# Patient Record
Sex: Female | Born: 1955
Health system: Southern US, Community
[De-identification: ages and names within clinical notes are randomized; demographics above are authoritative.]

## PROBLEM LIST (undated history)

## (undated) DIAGNOSIS — H269 Unspecified cataract: Secondary | ICD-10-CM

## (undated) DIAGNOSIS — F419 Anxiety disorder, unspecified: Secondary | ICD-10-CM

## (undated) DIAGNOSIS — Z9889 Other specified postprocedural states: Secondary | ICD-10-CM

## (undated) DIAGNOSIS — R112 Nausea with vomiting, unspecified: Secondary | ICD-10-CM

## (undated) DIAGNOSIS — E119 Type 2 diabetes mellitus without complications: Secondary | ICD-10-CM

## (undated) DIAGNOSIS — G4730 Sleep apnea, unspecified: Secondary | ICD-10-CM

## (undated) DIAGNOSIS — K219 Gastro-esophageal reflux disease without esophagitis: Secondary | ICD-10-CM

## (undated) DIAGNOSIS — Z923 Personal history of irradiation: Secondary | ICD-10-CM

## (undated) DIAGNOSIS — Z87442 Personal history of urinary calculi: Secondary | ICD-10-CM

## (undated) DIAGNOSIS — C801 Malignant (primary) neoplasm, unspecified: Secondary | ICD-10-CM

## (undated) DIAGNOSIS — Z9221 Personal history of antineoplastic chemotherapy: Secondary | ICD-10-CM

## (undated) DIAGNOSIS — I10 Essential (primary) hypertension: Secondary | ICD-10-CM

## (undated) DIAGNOSIS — E785 Hyperlipidemia, unspecified: Secondary | ICD-10-CM

## (undated) HISTORY — PX: ABDOMINAL HYSTERECTOMY: SHX81

## (undated) HISTORY — DX: Essential (primary) hypertension: I10

## (undated) HISTORY — DX: Hyperlipidemia, unspecified: E78.5

## (undated) HISTORY — PX: CHOLECYSTECTOMY: SHX55

## (undated) HISTORY — DX: Unspecified cataract: H26.9

## (undated) HISTORY — PX: KNEE SURGERY: SHX244

## (undated) HISTORY — DX: Type 2 diabetes mellitus without complications: E11.9

---

## 1997-07-24 ENCOUNTER — Encounter: Admission: RE | Admit: 1997-07-24 | Discharge: 1997-10-22 | Payer: Self-pay | Admitting: Orthopedic Surgery

## 1997-10-01 ENCOUNTER — Other Ambulatory Visit: Admission: RE | Admit: 1997-10-01 | Discharge: 1997-10-01 | Payer: Self-pay | Admitting: Gynecology

## 1999-03-11 ENCOUNTER — Encounter: Admission: RE | Admit: 1999-03-11 | Discharge: 1999-03-11 | Payer: Self-pay | Admitting: Gynecology

## 1999-03-11 ENCOUNTER — Encounter: Payer: Self-pay | Admitting: Gynecology

## 1999-03-19 ENCOUNTER — Encounter: Admission: RE | Admit: 1999-03-19 | Discharge: 1999-03-19 | Payer: Self-pay | Admitting: Gynecology

## 1999-03-19 ENCOUNTER — Encounter: Payer: Self-pay | Admitting: Gynecology

## 1999-04-17 ENCOUNTER — Other Ambulatory Visit: Admission: RE | Admit: 1999-04-17 | Discharge: 1999-04-17 | Payer: Self-pay | Admitting: Gynecology

## 1999-12-01 ENCOUNTER — Encounter (INDEPENDENT_AMBULATORY_CARE_PROVIDER_SITE_OTHER): Payer: Self-pay

## 1999-12-01 ENCOUNTER — Other Ambulatory Visit: Admission: RE | Admit: 1999-12-01 | Discharge: 1999-12-01 | Payer: Self-pay | Admitting: Gynecology

## 2000-03-18 ENCOUNTER — Inpatient Hospital Stay (HOSPITAL_COMMUNITY): Admission: RE | Admit: 2000-03-18 | Discharge: 2000-03-20 | Payer: Self-pay | Admitting: Gynecology

## 2000-03-18 ENCOUNTER — Encounter (INDEPENDENT_AMBULATORY_CARE_PROVIDER_SITE_OTHER): Payer: Self-pay

## 2000-05-12 ENCOUNTER — Ambulatory Visit (HOSPITAL_COMMUNITY): Admission: RE | Admit: 2000-05-12 | Discharge: 2000-05-12 | Payer: Self-pay | Admitting: Gastroenterology

## 2000-06-02 ENCOUNTER — Encounter: Admission: RE | Admit: 2000-06-02 | Discharge: 2000-08-31 | Payer: Self-pay | Admitting: Family Medicine

## 2000-07-01 ENCOUNTER — Encounter: Admission: RE | Admit: 2000-07-01 | Discharge: 2000-07-01 | Payer: Self-pay | Admitting: Gynecology

## 2000-07-01 ENCOUNTER — Encounter: Payer: Self-pay | Admitting: Gynecology

## 2000-10-05 ENCOUNTER — Other Ambulatory Visit: Admission: RE | Admit: 2000-10-05 | Discharge: 2000-10-05 | Payer: Self-pay | Admitting: Gynecology

## 2001-07-05 ENCOUNTER — Encounter: Admission: RE | Admit: 2001-07-05 | Discharge: 2001-07-05 | Payer: Self-pay | Admitting: Gynecology

## 2001-07-05 ENCOUNTER — Encounter: Payer: Self-pay | Admitting: Gynecology

## 2001-12-05 ENCOUNTER — Other Ambulatory Visit: Admission: RE | Admit: 2001-12-05 | Discharge: 2001-12-05 | Payer: Self-pay | Admitting: Gynecology

## 2002-01-02 ENCOUNTER — Encounter: Admission: RE | Admit: 2002-01-02 | Discharge: 2002-01-04 | Payer: Self-pay | Admitting: Family Medicine

## 2002-01-16 ENCOUNTER — Encounter: Admission: RE | Admit: 2002-01-16 | Discharge: 2002-04-16 | Payer: Self-pay | Admitting: Family Medicine

## 2002-07-26 ENCOUNTER — Encounter: Admission: RE | Admit: 2002-07-26 | Discharge: 2002-07-26 | Payer: Self-pay | Admitting: Gynecology

## 2002-07-26 ENCOUNTER — Encounter: Payer: Self-pay | Admitting: Gynecology

## 2002-08-22 ENCOUNTER — Encounter (HOSPITAL_BASED_OUTPATIENT_CLINIC_OR_DEPARTMENT_OTHER): Admission: RE | Admit: 2002-08-22 | Discharge: 2002-11-20 | Payer: Self-pay | Admitting: Internal Medicine

## 2002-12-26 ENCOUNTER — Other Ambulatory Visit: Admission: RE | Admit: 2002-12-26 | Discharge: 2002-12-26 | Payer: Self-pay | Admitting: Gynecology

## 2003-03-23 ENCOUNTER — Encounter (HOSPITAL_BASED_OUTPATIENT_CLINIC_OR_DEPARTMENT_OTHER): Admission: RE | Admit: 2003-03-23 | Discharge: 2003-03-30 | Payer: Self-pay | Admitting: Internal Medicine

## 2003-06-28 ENCOUNTER — Encounter (HOSPITAL_BASED_OUTPATIENT_CLINIC_OR_DEPARTMENT_OTHER): Admission: RE | Admit: 2003-06-28 | Discharge: 2003-09-26 | Payer: Self-pay | Admitting: Internal Medicine

## 2003-09-14 ENCOUNTER — Encounter: Admission: RE | Admit: 2003-09-14 | Discharge: 2003-09-14 | Payer: Self-pay | Admitting: Gynecology

## 2004-02-20 ENCOUNTER — Other Ambulatory Visit: Admission: RE | Admit: 2004-02-20 | Discharge: 2004-02-20 | Payer: Self-pay | Admitting: Gynecology

## 2004-09-17 ENCOUNTER — Encounter: Admission: RE | Admit: 2004-09-17 | Discharge: 2004-09-17 | Payer: Self-pay | Admitting: Gynecology

## 2005-03-18 ENCOUNTER — Other Ambulatory Visit: Admission: RE | Admit: 2005-03-18 | Discharge: 2005-03-18 | Payer: Self-pay | Admitting: Gynecology

## 2005-06-11 ENCOUNTER — Ambulatory Visit (HOSPITAL_BASED_OUTPATIENT_CLINIC_OR_DEPARTMENT_OTHER): Admission: RE | Admit: 2005-06-11 | Discharge: 2005-06-11 | Payer: Self-pay | Admitting: Orthopedic Surgery

## 2005-10-21 ENCOUNTER — Encounter: Admission: RE | Admit: 2005-10-21 | Discharge: 2005-10-21 | Payer: Self-pay | Admitting: Gynecology

## 2006-03-10 ENCOUNTER — Other Ambulatory Visit: Admission: RE | Admit: 2006-03-10 | Discharge: 2006-03-10 | Payer: Self-pay | Admitting: Gynecology

## 2006-11-04 ENCOUNTER — Encounter: Admission: RE | Admit: 2006-11-04 | Discharge: 2006-11-04 | Payer: Self-pay | Admitting: Gynecology

## 2006-11-05 ENCOUNTER — Ambulatory Visit (HOSPITAL_COMMUNITY): Admission: RE | Admit: 2006-11-05 | Discharge: 2006-11-05 | Payer: Self-pay | Admitting: Family Medicine

## 2007-07-11 ENCOUNTER — Other Ambulatory Visit: Admission: RE | Admit: 2007-07-11 | Discharge: 2007-07-11 | Payer: Self-pay | Admitting: Gynecology

## 2007-12-16 ENCOUNTER — Encounter: Admission: RE | Admit: 2007-12-16 | Discharge: 2007-12-16 | Payer: Self-pay | Admitting: Gynecology

## 2010-08-08 NOTE — Op Note (Signed)
[name], [name]                  ACCOUNT NO.:  [id number]   MEDICAL RECORD NO.:  [id number]          PATIENT TYPE:  AMB   LOCATION:  [facility]                         FACILITY:  [facility]   PHYSICIAN:  Marlowe Kays, M.D.  DATE OF BIRTH:  [birth year]   DATE OF PROCEDURE:  06/11/2005  DATE OF DISCHARGE:                                 OPERATIVE REPORT   PREOP DIAGNOSIS:  Medial shelf plica right knee.   POSTOP DIAGNOSIS.:  1.  Medial shelf and suprapatellar plicae.  2.  Torn medial and lateral menisci.  3.  Grade 2/4 chondromalacia between the femoral condyle and medial facette      patella.   OPERATION:  1.  Right knee arthroscopy with partial medial meniscectomy.  2.  Shaving medial femoral condyle and medial facette patella.  3.  Excision of numerous suprapatellar plicae   SURGEON:  Marlowe Kays, M.D.   ASSISTANT:  None.   ANESTHESIA:  General   DESCRIPTION OF PROCEDURE:  She has had pain in her right knee following on-  the-job injury. An MRI of May 18, 2005 demonstrated some mild  tricompartmental degenerative disease. There was felt to be no meniscus  tear; and a prominent medial shelf plica. She was here, today, for  correction of the plica, but the other pathology noted under postop  diagnoses was noted at time of surgery and corrected.   PROCEDURE:  Satisfactory general anesthesia, pneumatic tourniquet leg was  Esmarched out nonsterilely.  Left leg was wrapped with Ace wrap and knee  support. Right leg was prepped from thigh stabilizers. Ankle with DuraPrep  and draped in sterile field. Superior medial saline inflow.  First through  an anterolateral portal medial compartment knee joint was evaluated.  Immediately noted was the torn medial meniscus which was a bucket-handle-  type tear; and I resected this partially with small scissors and then shaved  it down until smooth with a 3.5 shaver. She had a grade 2/4 chondromalacia  over most of medial femoral condyle,  which I shaved until smooth, with a 3.5  shaver as well. Pre-and-post films were taken. Posterior horn of the medial  meniscus appeared normal. Looking up in the medial gutter in the  suprapatellar area, the patellar abnormality with chondromalacia of medial  facette was noted, as well as a large plica band in the midportion of the  suprapatellar area, and also plicae to either side.   The plicae were cut with small scissors and eradicated with the 3.5 shaver  and the medial facette of the patella was shaved.  Then then through an  anteromedial portal, I looked at the lateral compartment of the knee joint.  The joint surfaces looked good. She had some minor tearing of the  intercondylar portion of the lateral meniscus was also a small tear of the  mid curve, both of which I resected with a combination of baskets and a 3.5  shaver. Final pictures were taken. Knee joint was then irrigated until  clear, and all fluid possible was removed. The two antral portals were  closed with 4-0 nylon  as well as an accessory superolateral portal. I then  injected through the inflow apparatus, 20 mL of 1/2% Marcaine with  adrenalin, and 4 mg of morphine. I then removed the inflow apparatus and  closed this  portal with 4-0 nylon as well. Betadine Adaptic dry sterile dressings were  applied. Tourniquet was released. She tolerated the procedure well and was  taken to the recovery room in satisfactory condition.  There were no known  complications.           ______________________________  Marlowe Kays, M.D.     JA/MEDQ  D:  06/11/2005  T:  06/12/2005  Job:  [id number]

## 2010-08-08 NOTE — Consult Note (Signed)
[name], [name]                              ACCOUNT NO.:  [id number]   MEDICAL RECORD NO.:  [id number]                   PATIENT TYPE:  REC   LOCATION:  FOOT                                 FACILITY:  [facility]   PHYSICIAN:  Jonelle Sports. Sevier, M.D.              DATE OF BIRTH:  Apr 05, 1955   DATE OF CONSULTATION:  08/28/2002  DATE OF DISCHARGE:                                   CONSULTATION   HISTORY:  This is a 55 year old white female with type 2 diabetes in good  control with recent hemoglobin of 6.0 and was seen for assistance with  management of a painful corn on the dorsolateral aspect of the left fifth  toe.  This apparently has been present for a number of months and has been  quite painful to her.  She also reports that it seems to cause some pain in  the fourth toe as well and also that she has pain underlying the balls of  the feet bilaterally.  Finally, she has noted a hardened area on the  interdigital aspect of the fourth toe on the right in the 4-5 interspace.  She has had no previous treatment for these pains.   PAST MEDICAL HISTORY:  Notable for hyperlipidemia as well as her type 2  diabetes.   ALLERGIES:  No known medicinal allergies.   MEDICATIONS:  Amitriptyline, baby aspirin, Lopid, Tylenol, Advil.   PHYSICAL EXAMINATION:  Examination today, is limited to the distal lower  extremities.  The feet without gross deformity, although there is some  slight tendency toward clawing of the toes which has resulted in slight  anterior migration of the metatarsal fatpads which in turn likely accounts  for her metatarsalgia.  Her skin temperature in the feet are normal and  symmetrical.  Pulses are present and adequate.  She has no edema.  Nonfilament testing shows that protective sensation is present throughout  both feet.   On the dorsolateral aspect of the right fifth toe is a hardened corn with no  evidence of secondary infection.   On the lateral interdigital aspect  of the right fourth toe, in it's proximal  phalangeal area, is a pressure-type callus, which is apparently caused by  pressure from the interphalangeal zone of the fifth toe.   The patient's foot wear was evaluated and found to be uniformly too narrow,  both her dress shoes and her sandles.  She does have a pair of tennis shoes  which appear adequate in width.   DISPOSITION:  1. The patient is given instruction in regards to foot care and diabetes by     video with nursing, physician reinforcement.  2. The matter of foot wear is discussed with the patient.  She is advised     that because of the fatpad migration, she would generally fair better in     flat shoes, rather than heeled  shoes.  In addition, it is demonstrated to     her the need to get shoes with adequate width in the toe box area.  She     promises to do this.  3. The callus on the dorsolateral aspect of her left fifth toe is repaired     to include excavating a tiny central core.  15% salicylated acid and     collodion was then applied to this area.  4. The callus on the interdigital aspect of the right fourth toe in the 4-5     interspace is sharply repaired without incident.  5.     She is given a pad to use on the right fourth toe and instructed in it's     proper use.  6. Any followup visit to this clinic will be on a p.r.n. basis.  The patient     is advised that should the lesions again become painful, that we would be     happy to see her.                                               Jonelle Sports. Cheryll Cockayne, M.D.    RES/MEDQ  D:  08/28/2002  T:  08/28/2002  Job:  [id number]   cc:   Dr. Manuela Schwartz

## 2010-08-08 NOTE — Op Note (Signed)
[facility]  Patient:    [name], [name]                   MRN: [id number] Proc. Date: 03/18/00 Attending:  Leatha Gilding. Mezer, M.D.                           Operative Report  PREOPERATIVE DIAGNOSES:  Endometrial hyperplasia, menorrhagia and irregular cycles.  POSTOPERATIVE DIAGNOSES:  Endometrial hyperplasia, menorrhagia and irregular cycles, diverticulosis.  OPERATION PERFORMED:  Total abdominal hysterectomy and bilateral salpingo-oophorectomy.  SURGEON:  Dr. Teodora Medici.  SURGEON:  Dr. Harl Bowie.  ANESTHESIA:  General endotracheal.  PREPARATION:  Betadine.  DESCRIPTION OF PROCEDURE:  With the patient in the supine position, she was prepped and draped in the routine fashion. A Pfannenstiel incision was made through the skin and subcutaneous tissue. The fascia and peritoneum were opened without difficulty. The tissue was very vascular and meticulous attention was paid to hemostasis. Upon entering the peritoneal cavity, the upper abdomen was explored with no abnormal findings. Exploration of the pelvis revealed the uterus to be approximately 8 weeks in size and symmetrical. The tubes and ovaries were grossly normal. It was noted immediately that there was a significant amount of diverticulosis and the large bowel was handled with extreme care to minimize the possibility of postoperative diverticulitis. A Balfour extender was used. The round ligaments were suture ligated with #1 chromic and divided. The bladder was unusually advanced on the uterus and the bladder pillars were very thickened and very scarred. The infundibulopelvic ligaments were isolated, clamped, cut and free tied with #1 chromic and then suture ligated with #1 chromic. The uterine arteries taken separately, clamped, cut and suture ligated with #1 chromic. As the next bite to release the uterine arteries was taken, the very poor tissue quality of the cardinal ligaments  was noted and even if the plan for surgery was a subtotal hysterectomy, the quality of the tissue in the cardinal ligaments would not have allowed this as this tissue torn beyond the clamp with every bite with significant bleeding. The cardinal ligaments were then taken in several bites, clamped, cut and suture ligated with #1 chromic. The uterosacral ligaments taken separately, clamped, cut and suture ligated with #1 chromic. The vagina was entered anteriorly and the specimen excised with circumferential dissection. The cervix appeared to be complete. The cuff was excessively vascular both anteriorly and posteriorly and there was a tear into the left vascular fornix which was taken into consideration as the suturing progressed. The cuff was then whipped anteriorly and posteriorly with running locked #1 chromic suture. The cuff was then approximated with 2 interrupted #1 chromic sutures. Several bleeding points on the bladder were arrested with gentle cautery and the bladder very carefully inspected especially at the base to be certain that there was on injury to the bladder. Given the amount of bleeding and the need to suture vessels, the bladder was potentially in harms way but the inspection at this point in the procedure revealed the bladder to be safe. The bladder flap was then approximated over the vaginal cuff with a running 3-0 Vicryl suture. The pelvis was irrigated with copious amounts of warm lactated Ringers solution and hemostasis appeared to be intact. The ureters were inspected and visualized bilaterally and appeared not to be dilated and peristalsed bilaterally. At the completion of the procedure, an effort was made to place the large bowel in the cul-de-sac,  the omentum was brought down and the abdomen was closed in layers using a running 2-0 Vicryl on the peritoneum, running #0 Vicryl in the midline bilaterally on the fascia. Hemostasis was assured in the subcutaneous  tissue and the skin was closed with staples. The estimated blood loss was approximately 1200 cc. The sponge, needle and instrument counts were correct x 2. The patient tolerated the procedure well and was taken to the recovery room in satisfactory condition. DD:  03/18/00 TD:  03/18/00 Job: [id number]

## 2010-08-08 NOTE — H&P (Signed)
[facility]  Patient:    [name], [name]                   MRN: [id number] Proc. Date: 03/18/00 Adm. Date:  03/18/00 Attending:  Leatha Gilding. Mezer, M.D.                         History and Physical  ADMITTING DIAGNOSIS:  Endometrial hyperplasia.  HISTORY OF PRESENT ILLNESS:  The patient is a 55 year old, gravida 3, para 9 female, admitted with endometrial hyperplasia, menorrhagia, and irregular cycles for a total abdominal hysterectomy and bilateral salpingo-oophorectomy. The patient has a long history of irregular cycles and heavy bleeding and endometrial hyperplasia.  The patient is unable to tolerate several different progestin medications and wishes to proceed with hysterectomy.  A total abdominal hysterectomy and bilateral salpingo-oophorectomy has been discussed with the patient in detail.  The patient wishes to have her ovaries removed and understands the possible need for hormone replacement therapy.  The ACOG booklet has been reviewed.  The potential complications, including but not limited to, anesthesia; injury to the bowel or bladder or ureters, possible fistula formation, possible blood loss with transfusion sequelae, and possible infection of the pelvis and the wound have been discussed in detail.  The patient understands that her risks are increased secondary to her obesity. The postoperative expectations and restrictions have been discussed with the patient in detail.  The patient understands that this will result in permanent sterilization.  PAST MEDICAL HISTORY:  SURGICAL:  Laparoscopic appendectomy, diagnostic laparoscopy, D&C, cystoscopy.  MEDICAL:  Noncontributory.  MEDICATIONS:  None.  ALLERGIES:  None known.  SMOKES:   None.  ALCOHOL:  None.  FAMILY HISTORY:  Positive for carcinoma of the colon.  SOCIAL HISTORY:  The patient is married with three children.  PHYSICAL EXAMINATION:  HEENT:  Negative.  LUNGS:   Clear.  BREASTS:  Without masses.  HEART:  Without murmurs.  LUNGS:  Clear.  ABDOMEN:  Obese, soft, and nontender.  PELVIC:  BUS, vagina, and cervix normal.  On bimanual exam, the uterus is top-normal in size.  The adnexa are without masses.  EXTREMITIES:  Negative.  IMPRESSION: 1. Endometrial hyperplasia. 2. Menorrhagia. 3. Irregular cycles. 4. Obesity.  PLAN:  Total abdominal hysterectomy and bilateral salpingo-oophorectomy. DD:  03/18/00 TD:  03/18/00 Job: [id number]

## 2014-09-26 ENCOUNTER — Encounter: Payer: BLUE CROSS/BLUE SHIELD | Attending: Family Medicine | Admitting: *Deleted

## 2014-09-26 ENCOUNTER — Encounter: Payer: Self-pay | Admitting: *Deleted

## 2014-09-26 VITALS — Ht 67.0 in | Wt 212.5 lb

## 2014-09-26 DIAGNOSIS — E119 Type 2 diabetes mellitus without complications: Secondary | ICD-10-CM | POA: Diagnosis not present

## 2014-09-26 DIAGNOSIS — Z713 Dietary counseling and surveillance: Secondary | ICD-10-CM | POA: Insufficient documentation

## 2014-09-26 DIAGNOSIS — Z794 Long term (current) use of insulin: Secondary | ICD-10-CM | POA: Insufficient documentation

## 2014-09-26 NOTE — Progress Notes (Signed)
Diabetes Self-Management Education  Visit Type: First/Initial (DX: 2005 A1c 14.0%)  Appt. Start Time: 1500 Appt. End Time: 1630  09/26/2014  [name], identified by name and date of birth, is a 59 y.o. female with a diagnosis of Diabetes: Type 2.  Other people present during visit:  Patient . Feven spoke with administrative staff yesterday afternoon with high anxiety.She was upset due to a high glucose reading of >400 the night before. We made accommodations to see the patient today. After evaluating dietary recall it was found that she had consumed macaroni and cheese, baked potatoes, 2 pieces of corn on the cob. This high carbohydrate meal contributed to her elevated glucose.  ASSESSMENT  Height 5\' 7"  (1.702 m), weight 212 lb 8 oz (96.389 kg). Body mass index is 33.27 kg/(m^2).  Initial Visit Information:  Are you currently following a meal plan?: No Are you taking your medications as prescribed?: Yes Are you checking your feet?: Yes How many days per week are you checking your feet?: 3 How often do you need to have someone help you when you read instructions, pamphlets, or other written materials from your doctor or pharmacy?: 1 - Never What is the last grade level you completed in school?: MBA  Psychosocial:   Patient Belief/Attitude about Diabetes: Motivated to manage diabetes Self-care barriers: None Self-management support: Doctor's office, CDE visits Other persons present: Patient Patient Concerns: Nutrition/Meal planning, Medication, Weight Control Special Needs: None Preferred Learning Style: No preference indicated Learning Readiness: Change in progress  Complications:   Last HgB A1C per patient/outside source: 11.1 mg/dL How often do you check your blood sugar?: 3-4 times/day Fasting Blood glucose range (mg/dL): >200, 180-200 (197-226 range) Postprandial Blood glucose range (mg/dL): >200, 180-200 (Range 190-300mg /dl) Number of hypoglycemic episodes per month:  0 Number of hyperglycemic episodes per week:  (most readings) Have you had a dilated eye exam in the past 12 months?: Yes Have you had a dental exam in the past 12 months?: No  Diet Intake:  Breakfast: cereal honey nut cheerios 1C,  /  [place] Light & Fit Greek Yogurt / quaker instant oatmeal (maple brown sugar) /  Snack (morning): smoothie (greek yogurt, fruit) Lunch: salad, ham or [place], craisins, walnuts. light ranch or sweet onion vinegarette/ dinner left overs Snack (afternoon): popcorn [place] redenbacker flavored Dinner: grilled chicken, grilled vegetables, salad/baked potatoe / macaroni & cheese, 1/2 baked potatoe, corn on the cob X2 / Snack (evening): popcorn, almonds Beverage(s): water, diet soda,   Exercise:  Exercise: ADL's  Individualized Plan for Diabetes Self-Management Training:   Learning Objective:  Patient will have a greater understanding of diabetes self-management. Patient education plan per assessed needs and concerns is to attend individual sessions     Education Topics Reviewed with Patient Today:   Role of diet in the treatment of diabetes and the relationship between the three main macronutrients and blood glucose level, Food label reading, portion sizes and measuring food., Carbohydrate counting, Meal timing in regards to the patients' current diabetes medication., Information on hints to eating out and maintain blood glucose control. Role of exercise on diabetes management, blood pressure control and cardiac health. Reviewed patients medication for diabetes, action, purpose, timing of dose and side effects. Discussed and identified patients' treatment of hyperglycemia. Assessed and discussed foot care and prevention of foot problems, Dental care, Retinopathy and reason for yearly dilated eye exams  PATIENTS GOALS/Plan (Developed by the patient):  Nutrition: General guidelines for healthy choices and portions discussed Physical Activity:  Exercise 3-5  times per week Medications: take my medication as prescribed Monitoring : test my blood glucose as discussed (note x per day with comment) (3 times daily) Reducing Risk: do foot checks daily   Patient Instructions  Plan:  Aim for 2-3 Carb Choices per meal (30-45 grams) +/- 1 either way  Aim for 0-15 Carbs per snack if hungry  Include protein in moderation with your meals and snacks Consider reading food labels for Total Carbohydrate and Fat Grams of foods Consider  increasing your activity level by walking for 30 minutes daily as tolerated Continue checking BG at alternate times per day as directed by MD  Consider taking Metformin with food in your stomach - with Breakfast and Dinner  [place] with fruit, do not drink the juice or syrup [place] reduced calorie bread 16XWR/ slice   Expected Outcomes:  Demonstrated interest in learning. Expect positive outcomes  Education material provided: Living Well with Diabetes, A1C conversion sheet, Meal plan card, My Plate, Snack sheet and Support group flyer  If problems or questions, patient to contact team via:  Phone  Future DSME appointment: 4-6 wks

## 2014-09-26 NOTE — Patient Instructions (Signed)
Plan:  Aim for 2-3 Carb Choices per meal (30-45 grams) +/- 1 either way  Aim for 0-15 Carbs per snack if hungry  Include protein in moderation with your meals and snacks Consider reading food labels for Total Carbohydrate and Fat Grams of foods Consider  increasing your activity level by walking for 30 minutes daily as tolerated Continue checking BG at alternate times per day as directed by MD  Consider taking Metformin with food in your stomach - with Breakfast and Dinner  [place] with fruit, do not drink the juice or syrup [place] reduced calorie bread 71IRC/ slice

## 2014-10-30 ENCOUNTER — Encounter: Payer: Self-pay | Admitting: *Deleted

## 2014-10-30 ENCOUNTER — Encounter: Payer: BLUE CROSS/BLUE SHIELD | Attending: Family Medicine | Admitting: *Deleted

## 2014-10-30 VITALS — Wt 215.2 lb

## 2014-10-30 DIAGNOSIS — E119 Type 2 diabetes mellitus without complications: Secondary | ICD-10-CM

## 2014-10-30 DIAGNOSIS — Z713 Dietary counseling and surveillance: Secondary | ICD-10-CM | POA: Diagnosis not present

## 2014-10-30 DIAGNOSIS — Z794 Long term (current) use of insulin: Secondary | ICD-10-CM | POA: Diagnosis not present

## 2014-10-30 NOTE — Patient Instructions (Addendum)
Continue to increase insuline 3 units every week to goal of glucose reading of 150mg /dl Add carbs to protein and protein to carbs Increase intensity of walk Test glucose FBS daily and alternate before meal or 2 hrs after meal Always take Metformin with food in your stomach

## 2014-10-30 NOTE — Progress Notes (Signed)
Diabetes Self-Management Education  Visit Type: Follow-up  Appt. Start Time: 1530 Appt. End Time: 1630  10/30/2014  [name], identified by name and date of birth, is a 59 y.o. female with a diagnosis of Diabetes: Type 2. Brooks returns for 4 weeks f/u. She has made many modifications in her dietary intake.  She has obtained glucose recovery products. She has a f/u appointment with Dr. Moreen Fowler 11/2014.  ASSESSMENT  Weight 215 lb 3.2 oz (97.614 kg). Body mass index is 33.7 kg/(m^2).      Diabetes Self-Management Education - 10/30/14 1558    Visit Information   Visit Type Follow-up   Initial Visit   Diabetes Type Type 2   Are you currently following a meal plan? Yes   Are you taking your medications as prescribed? Yes   Psychosocial Assessment   Patient Belief/Attitude about Diabetes Motivated to manage diabetes   Self-care barriers None   Self-management support Doctor's office;Family;CDE visits   Other persons present Patient   Patient Concerns Nutrition/Meal planning;Medication;Monitoring;Glycemic Control   Special Needs None   Preferred Learning Style No preference indicated   Learning Readiness Change in progress   How often do you need to have someone help you when you read instructions, pamphlets, or other written materials from your doctor or pharmacy? 1 - Never   Complications   How often do you check your blood sugar? 1-2 times/day   Fasting Blood glucose range (mg/dL) 130-179;>200  169-204   Postprandial Blood glucose range (mg/dL) >200   Number of hypoglycemic episodes per month 0   Dietary Intake   Breakfast 1/2 egg sandwich   Lunch lunch meat sandwich, veggie straws / salade   Dinner grilled meat, vegetables,    Snack (evening) popcorn   Exercise   Exercise Type Light (walking / raking leaves)   How many days per week to you exercise? 5   How many minutes per day do you exercise? 30   Total minutes per week of exercise 150   Patient Education   Previous  Diabetes Education Yes (please comment)   Nutrition management  Role of diet in the treatment of diabetes and the relationship between the three main macronutrients and blood glucose level;Carbohydrate counting;Information on hints to eating out and maintain blood glucose control.   Physical activity and exercise  Role of exercise on diabetes management, blood pressure control and cardiac health.;Helped patient identify appropriate exercises in relation to his/her diabetes, diabetes complications and other health issue.  increase intensity of walk   Medications Reviewed patients medication for diabetes, action, purpose, timing of dose and side effects.  Metformin causing nausea in the morning   Monitoring Taught/discussed recording of test results and interpretation of SMBG.  provided new glucose log book for recording   Psychosocial adjustment Role of stress on diabetes   Individualized Goals (developed by patient)   Nutrition General guidelines for healthy choices and portions discussed   Physical Activity Exercise 3-5 times per week;30 minutes per day  increase intensity of walk   Medications take my medication as prescribed  continue to increase insulin 3 units each week to goal of 150mg /dl   Monitoring  test my blood glucose as discussed  FBS & before dinner or 2 hours after first bite   Patient Self-Evaluation of Goals - Patient rates self as meeting previously set goals (% of time)   Nutrition 50 - 75 %   Physical Activity 50 - 75 %   Medications >75%  Monitoring 50 - 75 %   Outcomes   Expected Outcomes Demonstrated interest in learning. Expect positive outcomes   Future DMSE PRN   Program Status Completed   Subsequent Visit   Since your last visit have you continued or begun to take your medications as prescribed? Yes   Since your last visit have you experienced any weight changes? Gain   Weight Gain (lbs) 3   Since your last visit, are you checking your blood glucose at least  once a day? Yes      Individualized Plan for Diabetes Self-Management Training:   Learning Objective:  Patient will have a greater understanding of diabetes self-management. Patient education plan is to attend individual and/or group sessions per assessed needs and concerns.   Plan:   Patient Instructions  Continue to increase insuline 3 units every week to goal of glucose reading of 150mg /dl Add carbs to protein and protein to carbs Increase intensity of walk Test glucose FBS daily and alternate before meal or 2 hrs after meal Always take Metformin with food in your stomach    Expected Outcomes:  Demonstrated interest in learning. Expect positive outcomes  If problems or questions, patient to contact team via:  Phone  Future DSME appointment: PRN

## 2019-03-14 ENCOUNTER — Ambulatory Visit: Payer: Self-pay | Attending: Internal Medicine

## 2019-03-14 ENCOUNTER — Other Ambulatory Visit: Payer: Self-pay

## 2019-03-14 DIAGNOSIS — Z20822 Contact with and (suspected) exposure to covid-19: Secondary | ICD-10-CM

## 2019-03-15 LAB — NOVEL CORONAVIRUS, NAA: SARS-CoV-2, NAA: DETECTED — AB

## 2019-03-16 ENCOUNTER — Telehealth: Payer: Self-pay | Admitting: Infectious Diseases

## 2019-03-16 NOTE — Telephone Encounter (Signed)
Called to discuss with patient about Covid symptoms and the use of bamlanivimab, a monoclonal antibody infusion for those with mild to moderate Covid symptoms and at a high risk of hospitalization.  Pt is qualified for this infusion at the [facility] infusion center due to Diabetes   Message left to call back - MyChart sent as well

## 2020-07-22 DIAGNOSIS — N200 Calculus of kidney: Secondary | ICD-10-CM | POA: Diagnosis not present

## 2020-07-22 DIAGNOSIS — E1165 Type 2 diabetes mellitus with hyperglycemia: Secondary | ICD-10-CM | POA: Diagnosis not present

## 2020-07-22 DIAGNOSIS — G629 Polyneuropathy, unspecified: Secondary | ICD-10-CM | POA: Diagnosis not present

## 2020-07-22 DIAGNOSIS — E7800 Pure hypercholesterolemia, unspecified: Secondary | ICD-10-CM | POA: Diagnosis not present

## 2020-07-22 DIAGNOSIS — I10 Essential (primary) hypertension: Secondary | ICD-10-CM | POA: Diagnosis not present

## 2020-07-23 DIAGNOSIS — H5211 Myopia, right eye: Secondary | ICD-10-CM | POA: Diagnosis not present

## 2020-07-23 DIAGNOSIS — H40013 Open angle with borderline findings, low risk, bilateral: Secondary | ICD-10-CM | POA: Diagnosis not present

## 2020-07-23 DIAGNOSIS — H5310 Unspecified subjective visual disturbances: Secondary | ICD-10-CM | POA: Diagnosis not present

## 2020-07-23 DIAGNOSIS — H5202 Hypermetropia, left eye: Secondary | ICD-10-CM | POA: Diagnosis not present

## 2020-08-27 DIAGNOSIS — H40023 Open angle with borderline findings, high risk, bilateral: Secondary | ICD-10-CM | POA: Diagnosis not present

## 2020-09-04 DIAGNOSIS — Z1231 Encounter for screening mammogram for malignant neoplasm of breast: Secondary | ICD-10-CM | POA: Diagnosis not present

## 2020-09-04 DIAGNOSIS — Z124 Encounter for screening for malignant neoplasm of cervix: Secondary | ICD-10-CM | POA: Diagnosis not present

## 2020-09-04 DIAGNOSIS — Z13820 Encounter for screening for osteoporosis: Secondary | ICD-10-CM | POA: Diagnosis not present

## 2020-09-04 DIAGNOSIS — Z6831 Body mass index (BMI) 31.0-31.9, adult: Secondary | ICD-10-CM | POA: Diagnosis not present

## 2020-09-09 ENCOUNTER — Other Ambulatory Visit: Payer: Self-pay | Admitting: Obstetrics and Gynecology

## 2020-09-09 DIAGNOSIS — R928 Other abnormal and inconclusive findings on diagnostic imaging of breast: Secondary | ICD-10-CM

## 2020-09-19 DIAGNOSIS — H52209 Unspecified astigmatism, unspecified eye: Secondary | ICD-10-CM | POA: Diagnosis not present

## 2020-09-19 DIAGNOSIS — H5203 Hypermetropia, bilateral: Secondary | ICD-10-CM | POA: Diagnosis not present

## 2020-09-19 DIAGNOSIS — H524 Presbyopia: Secondary | ICD-10-CM | POA: Diagnosis not present

## 2020-09-30 ENCOUNTER — Ambulatory Visit
Admission: RE | Admit: 2020-09-30 | Discharge: 2020-09-30 | Disposition: A | Discharge status: Home / Self Care | Payer: Medicare HMO | Source: Ambulatory Visit | Attending: Obstetrics and Gynecology | Admitting: Obstetrics and Gynecology

## 2020-09-30 ENCOUNTER — Other Ambulatory Visit: Payer: Self-pay

## 2020-09-30 ENCOUNTER — Other Ambulatory Visit: Payer: Self-pay | Admitting: Obstetrics and Gynecology

## 2020-09-30 DIAGNOSIS — R922 Inconclusive mammogram: Secondary | ICD-10-CM | POA: Diagnosis not present

## 2020-09-30 DIAGNOSIS — N6310 Unspecified lump in the right breast, unspecified quadrant: Secondary | ICD-10-CM

## 2020-09-30 DIAGNOSIS — R928 Other abnormal and inconclusive findings on diagnostic imaging of breast: Secondary | ICD-10-CM

## 2020-10-04 ENCOUNTER — Other Ambulatory Visit: Payer: Self-pay

## 2020-10-04 ENCOUNTER — Ambulatory Visit
Admission: RE | Admit: 2020-10-04 | Discharge: 2020-10-04 | Disposition: A | Discharge status: Home / Self Care | Payer: Medicare HMO | Source: Ambulatory Visit | Attending: Obstetrics and Gynecology | Admitting: Obstetrics and Gynecology

## 2020-10-04 DIAGNOSIS — C50211 Malignant neoplasm of upper-inner quadrant of right female breast: Secondary | ICD-10-CM | POA: Diagnosis not present

## 2020-10-04 DIAGNOSIS — N6310 Unspecified lump in the right breast, unspecified quadrant: Secondary | ICD-10-CM

## 2020-10-04 DIAGNOSIS — N6312 Unspecified lump in the right breast, upper inner quadrant: Secondary | ICD-10-CM | POA: Diagnosis not present

## 2020-10-04 DIAGNOSIS — C50919 Malignant neoplasm of unspecified site of unspecified female breast: Secondary | ICD-10-CM

## 2020-10-04 HISTORY — DX: Malignant neoplasm of unspecified site of unspecified female breast: C50.919

## 2020-10-04 HISTORY — PX: BREAST BIOPSY: SHX20

## 2020-10-08 ENCOUNTER — Telehealth: Payer: Self-pay | Admitting: Oncology

## 2020-10-08 NOTE — Telephone Encounter (Signed)
Spoke to patient to confirm morning clinic appointment for 7/27, packet sent via e-mail

## 2020-10-14 ENCOUNTER — Encounter: Payer: Self-pay | Admitting: *Deleted

## 2020-10-14 DIAGNOSIS — Z170 Estrogen receptor positive status [ER+]: Secondary | ICD-10-CM | POA: Insufficient documentation

## 2020-10-14 DIAGNOSIS — C50211 Malignant neoplasm of upper-inner quadrant of right female breast: Secondary | ICD-10-CM | POA: Insufficient documentation

## 2020-10-15 NOTE — Progress Notes (Signed)
Radiation Oncology         ([phone]) [phone] ________________________________  Initial Outpatient Consultation  Name: [name] MRN: [id number]  Date: 10/16/2020  DOB: Nov 07, 1955  KL:[id number], Shanon Brow, MD  Rolm Bookbinder, MD   REFERRING PHYSICIAN: Rolm Bookbinder, MD  DIAGNOSIS:    ICD-10-CM   1. Malignant neoplasm of upper-inner quadrant of right breast in female, estrogen receptor positive ([place])  C50.211    Z17.0      Cancer Staging Malignant neoplasm of upper-inner quadrant of right breast in female, estrogen receptor positive ([place]) Staging form: Breast, AJCC 8th Edition - Clinical stage from 10/16/2020: Stage IA (cT1b, cN0, cM0, G1, ER+, PR-, HER2-) - Unsigned Stage prefix: Initial diagnosis Histologic grading system: 3 grade system Laterality: Right Staged by: Pathologist and managing physician Stage used in treatment planning: Yes National guidelines used in treatment planning: Yes Type of national guideline used in treatment planning: NCCN   CHIEF COMPLAINT: Here to discuss management of right breast cancer  HISTORY OF PRESENT ILLNESS::[name] is a 65 y.o. female who presented with breast abnormality on the following imaging: diagnostic right breast mammography and ultrasound on the date of 09/30/20 demonstrating a suspicious mass in the right beast at 12:30 o'clock measuring 0.7 cm.  No symptoms were reported at the time.  Right breast needle core biopsy at 12:30 o'clock on date of 10/04/20 showed: invasive mammary carcinoma measuring 6 mm in the greatest linear extent.  ER status: 90%, positive, with moderate staining intensity; PR status 0%, negative, Her2 status negative; Ki67 10%; Grade 1.  She works as an Clinical cytogeneticist for [employer].  She works remotely. She is here with her husband today.  PREVIOUS RADIATION THERAPY: No  PAST MEDICAL HISTORY:  has a past medical history of Cataract, Diabetes mellitus without complication ([place]),  Hyperlipidemia, and Hypertension.    PAST SURGICAL HISTORY: Past Surgical History:  Procedure Laterality Date   ABDOMINAL HYSTERECTOMY     CHOLECYSTECTOMY     KNEE SURGERY      FAMILY HISTORY: family history includes Breast cancer in her cousin; Colon cancer in her maternal grandmother; Diabetes in her father; Heart attack in her mother.  SOCIAL HISTORY:  reports that she quit smoking about 39 years ago. Her smoking use included cigarettes. She has a 5.00 pack-year smoking history. She does not have any smokeless tobacco history on file. She reports that she does not drink alcohol and does not use drugs.  ALLERGIES: Patient has no known allergies.  MEDICATIONS:  Current Outpatient Medications  Medication Sig Dispense Refill   ACCU-CHEK GUIDE test strip      amitriptyline (ELAVIL) 25 MG tablet Take 25 mg by mouth at bedtime.     Blood Glucose Monitoring Suppl (ACCU-CHEK GUIDE ME) w/Device KIT      cetirizine (ZYRTEC) 10 MG tablet 1 tablet     Insulin Syringe-Needle U-100 (INSULIN SYRINGE .5CC/30GX5/16") 30G X 5/16" 0.5 ML MISC See admin instructions.     losartan (COZAAR) 25 MG tablet 1 tablet     metFORMIN (GLUCOPHAGE) 1000 MG tablet Take 1,000 mg by mouth 2 (two) times daily with a meal.     sertraline (ZOLOFT) 50 MG tablet sertraline 50 mg tablet  TAKE 1 TABLET BY MOUTH EVERY DAY     simvastatin (ZOCOR) 10 MG tablet Take 10 mg by mouth daily.     TOUJEO SOLOSTAR 300 UNIT/ML Solostar Pen      No current facility-administered medications for this encounter.    REVIEW  OF SYSTEMS: As above   PHYSICAL EXAM:  vitals were not taken for this visit.   General: Alert and oriented, in no acute distress Heart: Regular in rate and rhythm with no murmurs, rubs, or gallops. Chest: Clear to auscultation bilaterally, with no rhonchi, wheezes, or rales. Skin: No concerning lesions over chest Musculoskeletal: symmetric strength and muscle tone throughout. Neurologic:  No obvious focalities.  Speech is fluent. Coordination is intact. Psychiatric: Judgment and insight are intact. Affect is appropriate. Breasts: No palpable masses appreciated in the breasts or axillae bilaterally.    ECOG = 0  0 - Asymptomatic (Fully active, able to carry on all predisease activities without restriction)  1 - Symptomatic but completely ambulatory (Restricted in physically strenuous activity but ambulatory and able to carry out work of a light or sedentary nature. For example, light housework, office work)  2 - Symptomatic, <50% in bed during the day (Ambulatory and capable of all self care but unable to carry out any work activities. Up and about more than 50% of waking hours)  3 - Symptomatic, >50% in bed, but not bedbound (Capable of only limited self-care, confined to bed or chair 50% or more of waking hours)  4 - Bedbound (Completely disabled. Cannot carry on any self-care. Totally confined to bed or chair)  5 - Death   Eustace Pen MM, Creech RH, Tormey DC, et al. [phone]). "Toxicity and response criteria of the [facility]". [place] Oncol. 5 (6): 649-55   LABORATORY DATA:  Lab Results  Component Value Date   WBC 7.7 10/16/2020   HGB 13.2 10/16/2020   HCT 39.3 10/16/2020   MCV 89.9 10/16/2020   PLT 230 10/16/2020   CMP     Component Value Date/Time   NA 140 10/16/2020 0833   K 4.1 10/16/2020 0833   CL 105 10/16/2020 0833   CO2 23 10/16/2020 0833   GLUCOSE 210 (H) 10/16/2020 0833   BUN 10 10/16/2020 0833   CREATININE 0.84 10/16/2020 0833   CALCIUM 9.5 10/16/2020 0833   PROT 7.3 10/16/2020 0833   ALBUMIN 3.8 10/16/2020 0833   AST 22 10/16/2020 0833   ALT 23 10/16/2020 0833   ALKPHOS 74 10/16/2020 0833   BILITOT 0.3 10/16/2020 0833   GFRNONAA >60 10/16/2020 3235         RADIOGRAPHY: US BREAST LTD UNI RIGHT INC AXILLA  Result Date: 09/30/2020 CLINICAL DATA:  65 year old female presenting as a recall from screening for possible right breast mass.  EXAM: DIGITAL DIAGNOSTIC UNILATERAL RIGHT MAMMOGRAM WITH TOMOSYNTHESIS AND CAD; ULTRASOUND RIGHT BREAST LIMITED TECHNIQUE: Right digital diagnostic mammography and breast tomosynthesis was performed. The images were evaluated with computer-aided detection.; Targeted ultrasound examination of the right breast was performed COMPARISON:  Previous exam(s). ACR Breast Density Category b: There are scattered areas of fibroglandular density. FINDINGS: Mammogram: Right breast: Spot compression tomosynthesis views of the right breast were performed demonstrating persistence of a 4 mm irregular mass in the superior central right breast. Ultrasound: Targeted ultrasound performed in the right breast at 12:30 o'clock 6 cm from the nipple demonstrating an irregular hypoechoic mass measuring 0.6 x 0.4 x 0.7 cm. This corresponds to the mass identified mammographically. Targeted ultrasound the right axilla demonstrates normal lymph nodes. IMPRESSION: Suspicious mass in the right breast at 12:30 o'clock measuring 0.7 cm. RECOMMENDATION: Ultrasound-guided core needle biopsy of the right breast mass at 12:30 o'clock. I have discussed the findings and recommendations with the patient who agrees to proceed with biopsy.  The patient will be scheduled for the biopsy appointment prior to leaving the office today. BI-RADS CATEGORY  4: Suspicious. Electronically Signed   By: Audie Pinto M.D.   On: 09/30/2020 14:00  MM DIAG BREAST TOMO UNI RIGHT  Result Date: 09/30/2020 CLINICAL DATA:  65 year old female presenting as a recall from screening for possible right breast mass. EXAM: DIGITAL DIAGNOSTIC UNILATERAL RIGHT MAMMOGRAM WITH TOMOSYNTHESIS AND CAD; ULTRASOUND RIGHT BREAST LIMITED TECHNIQUE: Right digital diagnostic mammography and breast tomosynthesis was performed. The images were evaluated with computer-aided detection.; Targeted ultrasound examination of the right breast was performed COMPARISON:  Previous exam(s). ACR Breast  Density Category b: There are scattered areas of fibroglandular density. FINDINGS: Mammogram: Right breast: Spot compression tomosynthesis views of the right breast were performed demonstrating persistence of a 4 mm irregular mass in the superior central right breast. Ultrasound: Targeted ultrasound performed in the right breast at 12:30 o'clock 6 cm from the nipple demonstrating an irregular hypoechoic mass measuring 0.6 x 0.4 x 0.7 cm. This corresponds to the mass identified mammographically. Targeted ultrasound the right axilla demonstrates normal lymph nodes. IMPRESSION: Suspicious mass in the right breast at 12:30 o'clock measuring 0.7 cm. RECOMMENDATION: Ultrasound-guided core needle biopsy of the right breast mass at 12:30 o'clock. I have discussed the findings and recommendations with the patient who agrees to proceed with biopsy. The patient will be scheduled for the biopsy appointment prior to leaving the office today. BI-RADS CATEGORY  4: Suspicious. Electronically Signed   By: Audie Pinto M.D.   On: 09/30/2020 14:00  MM CLIP PLACEMENT RIGHT  Result Date: 10/04/2020 CLINICAL DATA:  Evaluate biopsy marker EXAM: 3D DIAGNOSTIC RIGHT MAMMOGRAM POST ULTRASOUND BIOPSY COMPARISON:  Previous exam(s). FINDINGS: 3D Mammographic images were obtained following ultrasound guided biopsy of a right breast mass. The biopsy marking clip is in expected position at the site of biopsy. IMPRESSION: Appropriate positioning of the ribbon shaped biopsy marking clip at the site of biopsy in the biopsy 12 30 right breast mass. Final Assessment: Post Procedure Mammograms for Marker Placement Electronically Signed   By: Dorise Bullion III M.D   On: 10/04/2020 13:02  [place] RT BREAST BX W LOC DEV 1ST LESION IMG BX SPEC US GUIDE  Addendum Date: 10/08/2020   ADDENDUM REPORT: 10/07/2020 12:07 ADDENDUM: Pathology revealed GRADE I INVASIVE MAMMARY CARCINOMA, ATYPICAL LOBULAR HYPERPLASIA of the Right breast, 12:30 o'clock,  (ribbon clip). This was found to be concordant by Dr. Dorise Bullion. Pathology results were discussed with the patient by telephone. The patient reported doing well after the biopsy with tenderness at the site. Post biopsy instructions and care were reviewed and questions were answered. The patient was encouraged to call [facility] for any additional concerns. My direct phone number was provided. The patient was referred to [facility] on October 16, 2020. Pathology results reported by Terie Purser, RN on 10/07/2020. Electronically Signed   By: Dorise Bullion III M.D   On: 10/07/2020 12:07   Result Date: 10/08/2020 CLINICAL DATA:  Biopsy of a 12 30 right breast mass EXAM: ULTRASOUND GUIDED RIGHT BREAST CORE NEEDLE BIOPSY COMPARISON:  Previous exam(s). PROCEDURE: I met with the patient and we discussed the procedure of ultrasound-guided biopsy, including benefits and alternatives. We discussed the high likelihood of a successful procedure. We discussed the risks of the procedure, including infection, bleeding, tissue injury, clip migration, and inadequate sampling. Informed written consent was given. The  usual time-out protocol was performed immediately prior to the procedure. Lesion quadrant: 12 30, 6 cm from the nipple, right breast Using sterile technique and 1% Lidocaine as local anesthetic, under direct ultrasound visualization, a 12 gauge spring-loaded device was used to perform biopsy of the 12 30 right breast mass using a lateral approach. At the conclusion of the procedure a ribbon shaped tissue marker clip was deployed into the biopsy cavity. Follow up 2 view mammogram was performed and dictated separately. IMPRESSION: Ultrasound guided biopsy of a 12 30 right breast mass. No apparent complications. Electronically Signed: By: Dorise Bullion III M.D On: 10/04/2020 13:01     IMPRESSION/PLAN: Stage I  right breast cancer  She has been discussed at our multidisciplinary tumor board.  The consensus is that she would be a good candidate for breast conservation. I talked to her about the option of a mastectomy and informed her that her expected overall survival would be equivalent between mastectomy and breast conservation, based upon randomized controlled data. She is enthusiastic about breast conservation.  It was a pleasure meeting the patient today. We discussed the risks, benefits, and side effects of radiotherapy. I recommend radiotherapy to the right breast to reduce her risk of locoregional recurrence by 2/3.  We discussed that radiation would take approximately 3-4 weeks to complete and that I would give the patient a few weeks to heal following surgery before starting treatment planning.  If chemotherapy were to be given, this would precede radiotherapy. We spoke about acute effects including skin irritation and fatigue as well as much less common late effects including internal organ injury or irritation. We spoke about the latest technology that is used to minimize the risk of late effects for patients undergoing radiotherapy to the breast or chest wall. No guarantees of treatment were given. The patient is enthusiastic about proceeding with treatment. I look forward to participating in the patient's care.  I will await her referral back to me for postoperative follow-up and eventual CT simulation/treatment planning.  On date of service, in total, I spent 45 minutes on this encounter. Patient was seen in person.   __________________________________________   Eppie Gibson, MD  This document serves as a record of services personally performed by Eppie Gibson, MD. It was created on her behalf by Roney Mans, a trained medical scribe. The creation of this record is based on the scribe's personal observations and the provider's statements to them. This document has been checked and approved by  the attending provider.

## 2020-10-16 ENCOUNTER — Other Ambulatory Visit: Payer: Self-pay

## 2020-10-16 ENCOUNTER — Inpatient Hospital Stay: Payer: Medicare HMO | Attending: Oncology | Admitting: Oncology

## 2020-10-16 ENCOUNTER — Inpatient Hospital Stay: Payer: Medicare HMO | Admitting: Licensed Clinical Social Worker

## 2020-10-16 ENCOUNTER — Inpatient Hospital Stay: Payer: Medicare HMO

## 2020-10-16 ENCOUNTER — Encounter: Payer: Self-pay | Admitting: *Deleted

## 2020-10-16 ENCOUNTER — Ambulatory Visit
Admission: RE | Admit: 2020-10-16 | Discharge: 2020-10-16 | Disposition: A | Discharge status: Home / Self Care | Payer: Medicare HMO | Source: Ambulatory Visit | Attending: Radiation Oncology | Admitting: Radiation Oncology

## 2020-10-16 ENCOUNTER — Encounter: Payer: Self-pay | Admitting: Radiation Oncology

## 2020-10-16 ENCOUNTER — Encounter: Payer: Self-pay | Admitting: Oncology

## 2020-10-16 ENCOUNTER — Other Ambulatory Visit: Payer: Self-pay | Admitting: General Surgery

## 2020-10-16 ENCOUNTER — Encounter: Payer: Self-pay | Admitting: Physical Therapy

## 2020-10-16 ENCOUNTER — Ambulatory Visit: Payer: Medicare HMO | Attending: General Surgery | Admitting: Physical Therapy

## 2020-10-16 VITALS — BP 145/66 | HR 86 | Temp 97.7°F | Resp 18 | Ht 67.0 in | Wt 202.7 lb

## 2020-10-16 DIAGNOSIS — Z800 Family history of malignant neoplasm of digestive organs: Secondary | ICD-10-CM | POA: Diagnosis not present

## 2020-10-16 DIAGNOSIS — C50211 Malignant neoplasm of upper-inner quadrant of right female breast: Secondary | ICD-10-CM

## 2020-10-16 DIAGNOSIS — Z87891 Personal history of nicotine dependence: Secondary | ICD-10-CM

## 2020-10-16 DIAGNOSIS — Z170 Estrogen receptor positive status [ER+]: Secondary | ICD-10-CM

## 2020-10-16 DIAGNOSIS — R293 Abnormal posture: Secondary | ICD-10-CM | POA: Insufficient documentation

## 2020-10-16 DIAGNOSIS — Z803 Family history of malignant neoplasm of breast: Secondary | ICD-10-CM

## 2020-10-16 LAB — CMP (CANCER CENTER ONLY)
ALT: 23 U/L (ref 0–44)
AST: 22 U/L (ref 15–41)
Albumin: 3.8 g/dL (ref 3.5–5.0)
Alkaline Phosphatase: 74 U/L (ref 38–126)
Anion gap: 12 (ref 5–15)
BUN: 10 mg/dL (ref 8–23)
CO2: 23 mmol/L (ref 22–32)
Calcium: 9.5 mg/dL (ref 8.9–10.3)
Chloride: 105 mmol/L (ref 98–111)
Creatinine: 0.84 mg/dL (ref 0.44–1.00)
GFR, Estimated: >60 mL/min (ref >60)
Glucose, Bld: 210 mg/dL — ABNORMAL HIGH (ref 70–99)
Potassium: 4.1 mmol/L (ref 3.5–5.1)
Sodium: 140 mmol/L (ref 135–145)
Total Bilirubin: 0.3 mg/dL (ref 0.3–1.2)
Total Protein: 7.3 g/dL (ref 6.5–8.1)

## 2020-10-16 LAB — CBC WITH DIFFERENTIAL (CANCER CENTER ONLY)
Abs Immature Granulocytes: 0.01 10*3/uL (ref 0.00–0.07)
Basophils Absolute: 0.1 10*3/uL (ref 0.0–0.1)
Basophils Relative: 1 %
Eosinophils Absolute: 0.2 10*3/uL (ref 0.0–0.5)
Eosinophils Relative: 3 %
HCT: 39.3 % (ref 36.0–46.0)
Hemoglobin: 13.2 g/dL (ref 12.0–15.0)
Immature Granulocytes: 0 %
Lymphocytes Relative: 36 %
Lymphs Abs: 2.8 10*3/uL (ref 0.7–4.0)
MCH: 30.2 pg (ref 26.0–34.0)
MCHC: 33.6 g/dL (ref 30.0–36.0)
MCV: 89.9 fL (ref 80.0–100.0)
Monocytes Absolute: 0.6 10*3/uL (ref 0.1–1.0)
Monocytes Relative: 8 %
Neutro Abs: 4 10*3/uL (ref 1.7–7.7)
Neutrophils Relative %: 52 %
Platelet Count: 230 10*3/uL (ref 150–400)
RBC: 4.37 MIL/uL (ref 3.87–5.11)
RDW: 13.1 % (ref 11.5–15.5)
WBC Count: 7.7 10*3/uL (ref 4.0–10.5)
nRBC: 0 % (ref 0.0–0.2)

## 2020-10-16 LAB — GENETIC SCREENING ORDER

## 2020-10-16 NOTE — Therapy (Signed)
[place], [place], [address] Phone: [phone]   Fax:  [phone]  Physical Therapy Evaluation  Patient Details  Name: [name] MRN: [id number] Date of Birth: [birth year] Referring Provider (PT): Dr. Rolm Bookbinder   Encounter Date: 10/16/2020   PT End of Session - 10/16/20 1210     Visit Number 1    Number of Visits 2    Date for PT Re-Evaluation 12/11/20    PT Start Time 1003    PT Stop Time 1028    PT Time Calculation (min) 25 min    Activity Tolerance Patient tolerated treatment well    Behavior During Therapy [facility] for tasks assessed/performed             Past Medical History:  Diagnosis Date   Cataract    removed   Diabetes mellitus without complication ([place])    Hyperlipidemia    Hypertension     Past Surgical History:  Procedure Laterality Date   ABDOMINAL HYSTERECTOMY     CHOLECYSTECTOMY     KNEE SURGERY      There were no vitals filed for this visit.    Subjective Assessment - 10/16/20 1146     Subjective Patient reports she is here today to be seen by her medical team for her newly diagnosed right breast cancer.    Patient is accompained by: Family member    Pertinent History Patient was diagnosed on 09/30/2020 with right grade I invasive lobular carcinoma breast cancer. It measures 7 mm and is located in the upper inner quadrant. It is ER positive, PR negative and HER2 negative with a Ki67 of 10%.    Patient Stated Goals Reduce lymphedema risk and learn post op shoulder ROM HEP    Currently in Pain? No/denies                [facility] PT Assessment - 10/16/20 0001       Assessment   Medical Diagnosis Right breast cancer    Referring Provider (PT) Dr. Rolm Bookbinder    Onset Date/Surgical Date 09/30/20    Hand Dominance Right    Prior Therapy none      Precautions   Precautions Other (comment)    Precaution Comments active cancer      Restrictions    Weight Bearing Restrictions No      Balance Screen   Has the patient fallen in the past 6 months No    Has the patient had a decrease in activity level because of a fear of falling?  No    Is the patient reluctant to leave their home because of a fear of falling?  No      Home Ecologist residence    Living Arrangements Spouse/significant other    Available Help at Discharge Family      Prior Function   Level of Independence Independent    Vocation Full time employment    Vocation Requirements Professor for [employer]    Leisure She goes to the gym 5x/week and does weights and the elliptical 10 min      Cognition   Overall Cognitive Status Within Functional Limits for tasks assessed      Posture/Postural Control   Posture/Postural Control Postural limitations    Postural Limitations Rounded Shoulders;Forward head      ROM / Strength   AROM / PROM / Strength AROM;Strength      AROM  Overall AROM Comments Cervical bilateral sidebending and rotation limited 25%; others WNL    AROM Assessment Site Shoulder    Right/Left Shoulder Right;Left    Right Shoulder Extension 47 Degrees    Right Shoulder Flexion 156 Degrees    Right Shoulder ABduction 166 Degrees    Right Shoulder Internal Rotation 73 Degrees    Right Shoulder External Rotation 81 Degrees    Left Shoulder Extension 55 Degrees    Left Shoulder Flexion 164 Degrees    Left Shoulder ABduction 164 Degrees    Left Shoulder Internal Rotation 59 Degrees    Left Shoulder External Rotation 80 Degrees      Strength   Overall Strength Within functional limits for tasks performed               LYMPHEDEMA/ONCOLOGY QUESTIONNAIRE - 10/16/20 0001       Type   Cancer Type Right breast cancer      Lymphedema Assessments   Lymphedema Assessments Upper extremities      Right Upper Extremity Lymphedema   10 cm Proximal to Olecranon Process 30 cm    Olecranon  Process 27.1 cm    10 cm Proximal to Ulnar Styloid Process 21.9 cm    Just Proximal to Ulnar Styloid Process 16 cm    Across Hand at [employer] 18.8 cm    At [place] of 2nd Digit 6.5 cm      Left Upper Extremity Lymphedema   10 cm Proximal to Olecranon Process 30.7 cm    Olecranon Process 27.1 cm    10 cm Proximal to Ulnar Styloid Process 21 cm    Just Proximal to Ulnar Styloid Process 15.9 cm    Across Hand at [employer] 18.5 cm    At [place] of 2nd Digit 6.1 cm             L-DEX FLOWSHEETS - 10/16/20 1200       L-DEX LYMPHEDEMA SCREENING   Measurement Type Unilateral    L-DEX MEASUREMENT EXTREMITY Upper Extremity    POSITION  Standing    DOMINANT SIDE Right    At Risk Side Right    BASELINE SCORE (UNILATERAL) 0.7             The patient was assessed using the L-Dex machine today to produce a lymphedema index baseline score. The patient will be reassessed on a regular basis (typically every 3 months) to obtain new L-Dex scores. If the score is > 6.5 points away from his/her baseline score indicating onset of subclinical lymphedema, it will be recommended to wear a compression garment for 4 weeks, 12 hours per day and then be reassessed. If the score continues to be > 6.5 points from baseline at reassessment, we will initiate lymphedema treatment. Assessing in this manner has a 95% rate of preventing clinically significant lymphedema.      Katina Dung - 10/16/20 0001     Open a tight or new jar No difficulty    Do heavy household chores (wash walls, wash floors) No difficulty    Carry a shopping bag or briefcase No difficulty    Wash your back No difficulty    Use a knife to cut food No difficulty    Recreational activities in which you take some force or impact through your arm, shoulder, or hand (golf, hammering, tennis) No difficulty    During the past week, to what extent has your arm, shoulder or hand problem interfered with your  normal social activities with  family, friends, neighbors, or groups? Not at all    During the past week, to what extent has your arm, shoulder or hand problem limited your work or other regular daily activities Not at all    Arm, shoulder, or hand pain. None    Tingling (pins and needles) in your arm, shoulder, or hand None    Difficulty Sleeping No difficulty    DASH Score 0 %              Objective measurements completed on examination: See above findings.           Patient was instructed today in a home exercise program today for post op shoulder range of motion. These included active assist shoulder flexion in sitting, scapular retraction, wall walking with shoulder abduction, and hands behind head external rotation.  She was encouraged to do these twice a day, holding 3 seconds and repeating 5 times when permitted by her physician.      PT Education - 10/16/20 1200     Education Details Lymphedema risk reduction and post op shoulder ROM HEP    Person(s) Educated Patient;Spouse    Methods Explanation;Demonstration;Handout    Comprehension Returned demonstration;Verbalized understanding                 PT Long Term Goals - 10/16/20 1214       PT LONG TERM GOAL #1   Title Patient will demonstrate she has regained full shoulder ROM and function post operatively compared to baselines.    Time 8    Period Weeks    Status New    Target Date 12/11/20             Breast Clinic Goals - 10/16/20 1214       Patient will be able to verbalize understanding of pertinent lymphedema risk reduction practices relevant to her diagnosis specifically related to skin care.   Time 1    Period Days    Status Achieved      Patient will be able to return demonstrate and/or verbalize understanding of the post-op home exercise program related to regaining shoulder range of motion.   Time 1    Period Days    Status Achieved      Patient will be able to verbalize understanding of the importance of  attending the postoperative After Breast Cancer Class for further lymphedema risk reduction education and therapeutic exercise.   Time 1    Period Days    Status Achieved                   Plan - 10/16/20 1210     Clinical Impression Statement Patient was diagnosed on 09/30/2020 with right grade I invasive lobular carcinoma breast cancer. It measures 7 mm and is located in the upper inner quadrant. It is ER positive, PR negative and HER2 negative with a Ki67 of 10%. Her multidisciplinary medical team met prior to her assessments to determine a recommended treatment plan. She is planning to have a right lumpectomy and sentinel node biopsy followed by Oncotype testing, radiation, and anti-estrogen therapy. She will benefit from a post op PT reassessment to determine needs and from L-dex screens every 3 months for 2 years to detect subclinical lymphedema.    Stability/Clinical Decision Making Stable/Uncomplicated    Clinical Decision Making Low    Rehab Potential Excellent    PT Frequency --   Eval and 1 f/u visit  PT Treatment/Interventions ADLs/Self Care Home Management;Therapeutic exercise;Patient/family education    PT Next Visit Plan Will reassess 3-4 weeks post op to determine needs    PT Home Exercise Plan Post op shoulder ROM HEP    Consulted and Agree with Plan of Care Patient;Family member/caregiver    Family Member Consulted Husband             Patient will benefit from skilled therapeutic intervention in order to improve the following deficits and impairments:  Postural dysfunction, Decreased range of motion, Decreased knowledge of precautions, Impaired UE functional use, Pain  Visit Diagnosis: Malignant neoplasm of upper-inner quadrant of right breast in female, estrogen receptor positive ([place]) - Plan: PT plan of care cert/re-cert  Abnormal posture - Plan: PT plan of care cert/re-cert  Patient will follow up at outpatient cancer rehab 3-4 weeks following surgery.   If the patient requires physical therapy at that time, a specific plan will be dictated and sent to the referring physician for approval. The patient was educated today on appropriate basic range of motion exercises to begin post operatively and the importance of attending the After Breast Cancer class following surgery.  Patient was educated today on lymphedema risk reduction practices as it pertains to recommendations that will benefit the patient immediately following surgery.  She verbalized good understanding.      Problem List Patient Active Problem List   Diagnosis Date Noted   Malignant neoplasm of upper-inner quadrant of right breast in female, estrogen receptor positive ([place]) 10/14/2020   Annia Friendly, PT 10/16/20 12:16 PM   [place], [place], [address] Phone: [phone]   Fax:  [phone]  Name: ASHIA DEHNER MRN: [id number] Date of Birth: 19-Sep-1955

## 2020-10-16 NOTE — Progress Notes (Signed)
[place]  [phone] [phone]     ID: [name] DOB: Jun 08, 1955  MR#: [id number]  YTK#:[id number]  Patient Care Team: Kimberly Contras, MD as PCP - General (Family Medicine) Kimberly Kaufmann, RN as Oncology Nurse Navigator Kimberly Germany, RN as Oncology Nurse Navigator Kimberly Bookbinder, MD as Consulting Physician (General Surgery) [name], Kimberly Dad, MD as Consulting Physician (Oncology) Kimberly Gibson, MD as Attending Physician (Radiation Oncology) Kimberly Nigh, MD as Consulting Physician (Obstetrics and Gynecology) Kimberly Pi, MD as Referring Physician (Endocrinology) Kimberly Cruel, MD OTHER MD:  CHIEF COMPLAINT: Estrogen receptor positive breast cancer  CURRENT TREATMENT: Awaiting definitive surgery   HISTORY OF CURRENT ILLNESS: [name] had routine screening mammography on 09/04/2020 showing a possible abnormality in the right breast. She underwent right diagnostic mammography with tomography and right breast ultrasonography at [facility] on 09/30/2020 showing: breast density category B; 0.7 cm mass in right breast at 12:30; normal right axillary lymph nodes.  Accordingly on 10/04/2020 she proceeded to biopsy of the right breast area in question. The pathology from this procedure ([id number]) showed: invasive mammary carcinoma, e-cadherin negative, grade 1; atypical lobular hyperplasia. Prognostic indicators significant for: estrogen receptor, 90% positive with moderate staining intensity and progesterone receptor, 0% negative. Proliferation marker Ki67 at 10%. HER2 negative by immunohistochemistry (1+).  Cancer Staging Malignant neoplasm of upper-inner quadrant of right breast in female, estrogen receptor positive ([place]) Staging form: Breast, AJCC 8th Edition - Clinical stage from 10/16/2020: Stage IA (cT1b, cN0, cM0, G1, ER+, PR-, HER2-) - Signed by Kimberly Cruel, MD on 10/16/2020 Stage prefix: Initial  diagnosis Histologic grading system: 3 grade system Laterality: Right Staged by: Pathologist and managing physician Stage used in treatment planning: Yes National guidelines used in treatment planning: Yes Type of national guideline used in treatment planning: NCCN  The patient's subsequent history is as detailed below.   INTERVAL HISTORY: [name] was evaluated in the multidisciplinary breast cancer clinic on 10/16/2020 accompanied by her husband [name]. Her case was also presented at the multidisciplinary breast cancer conference on the same day. At that time a preliminary plan was proposed: No MRI given low breast density, breast conserving surgery with sentinel lymph node sampling, antiestrogens, Oncotype, adjuvant radiation   REVIEW OF SYSTEMS: There were no specific symptoms leading to the original mammogram, which was routinely scheduled. On the provided questionnaire, [name] reports loss of sleep, wearing glasses, sinus problems, UTI/kidney infections, anxiety and depression, and diabetes. The patient denies unusual headaches, visual changes, nausea, vomiting, stiff neck, dizziness, or gait imbalance. There has been no cough, phlegm production, or pleurisy, no chest pain or pressure, and no change in bowel or bladder habits. The patient denies fever, rash, bleeding, unexplained fatigue or unexplained weight loss. A detailed review of systems was otherwise entirely negative.   COVID 19 VACCINATION STATUS: Pfizer x3 as of July 2020   PAST MEDICAL HISTORY: Past Medical History:  Diagnosis Date   Cataract    removed   Diabetes mellitus without complication ([place])    Hyperlipidemia    Hypertension     PAST SURGICAL HISTORY: Past Surgical History:  Procedure Laterality Date   ABDOMINAL HYSTERECTOMY     CHOLECYSTECTOMY     KNEE SURGERY      FAMILY HISTORY: Family History  Problem Relation Age of Onset   Heart attack Mother    Diabetes Father    Colon cancer Maternal Grandmother     Breast cancer Cousin  dx early 65's   Her father died at age 64 from diabetes complications. Her mother died at age 44 from MI. [name] has two brothers (and no sisters). She reports breast cancer in a maternal cousin in her early 75's and colon cancer in a maternal grandmother.   GYNECOLOGIC HISTORY:  No LMP recorded. Menarche: 65 years old Age at first live birth: 65 years old [name] P 3 LMP 02/2000 Contraceptive: used from 45 HRT never used  Hysterectomy? Yes, 02/2000 BSO? 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 Husband [name] is a retired Radiographer, therapeutic. Son [name], age 16, is a Geophysicist/field seismologist and youth pastor in [place]. Daughter [name], age 47, is a high school principal in [place]. Daughter [name], age 35, is a hair stylist here in [place]. [name] has two grandchildren. She attends a [employer].    ADVANCED DIRECTIVES: In the absence of any documentation to the contrary, the patient's spouse is their HCPOA.    HEALTH MAINTENANCE: Social History   Tobacco Use   Smoking status: Former    Packs/day: 0.50    Years: 10.00    Pack years: 5.00    Types: Cigarettes    Quit date: 09/25/1981    Years since quitting: 39.0  Substance Use Topics   Alcohol use: Never   Drug use: Never     Colonoscopy: 2018 (Dr. Earlean Bridges)  PAP: 08/2020  Bone density: 08/2020   No Known Allergies  Current Outpatient Medications  Medication Sig Dispense Refill   ACCU-CHEK GUIDE test strip      amitriptyline (ELAVIL) 25 MG tablet Take 25 mg by mouth at bedtime.     Blood Glucose Monitoring Suppl (ACCU-CHEK GUIDE ME) w/Device KIT      cetirizine (ZYRTEC) 10 MG tablet 1 tablet     Insulin Syringe-Needle U-100 (INSULIN SYRINGE .5CC/30GX5/16") 30G X 5/16" 0.5 ML MISC See admin instructions.     losartan (COZAAR) 25 MG tablet 1 tablet     metFORMIN  (GLUCOPHAGE) 1000 MG tablet Take 1,000 mg by mouth 2 (two) times daily with a meal.     sertraline (ZOLOFT) 50 MG tablet sertraline 50 mg tablet  TAKE 1 TABLET BY MOUTH EVERY DAY     simvastatin (ZOCOR) 10 MG tablet Take 10 mg by mouth daily.     TOUJEO SOLOSTAR 300 UNIT/ML Solostar Pen      No current facility-administered medications for this visit.    OBJECTIVE:   Vitals:   10/16/20 0858  BP: (!) 145/66  Pulse: 86  Resp: 18  Temp: 97.7 F (36.5 C)  SpO2: 97%     Body mass index is 31.75 kg/m.   Wt Readings from Last 3 Encounters:  10/16/20 202 lb 11.2 oz (91.9 kg)  10/30/14 215 lb 3.2 oz (97.6 kg)  09/26/14 212 lb 8 oz (96.4 kg)      ECOG FS:1 - Symptomatic but completely ambulatory  Ocular: Sclerae unicteric, pupils round and equal Ear-nose-throat: Wearing a mask Lymphatic: No cervical or supraclavicular adenopathy Lungs no rales or rhonchi Heart regular rate and rhythm Abd soft, nontender, positive bowel sounds MSK no focal spinal tenderness, no joint edema Neuro: non-focal, well-oriented, appropriate affect Breasts: I do not palpate a mass in the right breast.  There are no skin or nipple changes of concern.  Left breast and both axillae are benign  LAB RESULTS:  CMP     Component Value Date/Time   NA 140 10/16/2020 0833   K 4.1 10/16/2020 0833   CL 105 10/16/2020 0833   CO2 23 10/16/2020 0833   GLUCOSE 210 (H) 10/16/2020 0833   BUN 10 10/16/2020 0833   CREATININE 0.84 10/16/2020 0833   CALCIUM 9.5 10/16/2020 0833   PROT 7.3 10/16/2020 0833   ALBUMIN 3.8 10/16/2020 0833   AST 22 10/16/2020 0833   ALT 23 10/16/2020 0833   ALKPHOS 74 10/16/2020 0833   BILITOT 0.3 10/16/2020 0833   GFRNONAA >60 10/16/2020 0833    No results found for: TOTALPROTELP, ALBUMINELP, A1GS, A2GS, BETS, BETA2SER, GAMS, MSPIKE, SPEI  Lab Results  Component Value Date   WBC 7.7 10/16/2020   NEUTROABS 4.0 10/16/2020   HGB 13.2 10/16/2020   HCT 39.3 10/16/2020   MCV 89.9  10/16/2020   PLT 230 10/16/2020    No results found for: LABCA2  No components found for: [id number]  No results for input(s): INR in the last 168 hours.  No results found for: LABCA2  No results found for: WUJ811  No results found for: BJY782  No results found for: NFA213  No results found for: CA2729  No components found for: HGQUANT  No results found for: CEA1 / No results found for: CEA1   No results found for: AFPTUMOR  No results found for: CHROMOGRNA  No results found for: KPAFRELGTCHN, LAMBDASER, KAPLAMBRATIO (kappa/lambda light chains)  No results found for: HGBA, HGBA2QUANT, HGBFQUANT, HGBSQUAN (Hemoglobinopathy evaluation)   No results found for: LDH  No results found for: IRON, TIBC, IRONPCTSAT (Iron and TIBC)  No results found for: FERRITIN  Urinalysis No results found for: COLORURINE, APPEARANCEUR, LABSPEC, PHURINE, GLUCOSEU, HGBUR, BILIRUBINUR, KETONESUR, PROTEINUR, UROBILINOGEN, NITRITE, LEUKOCYTESUR   STUDIES: US BREAST LTD UNI RIGHT INC AXILLA  Result Date: 09/30/2020 CLINICAL DATA:  65 year old female presenting as a recall from screening for possible right breast mass. EXAM: DIGITAL DIAGNOSTIC UNILATERAL RIGHT MAMMOGRAM WITH TOMOSYNTHESIS AND CAD; ULTRASOUND RIGHT BREAST LIMITED TECHNIQUE: Right digital diagnostic mammography and breast tomosynthesis was performed. The images were evaluated with computer-aided detection.; Targeted ultrasound examination of the right breast was performed COMPARISON:  Previous exam(s). ACR Breast Density Category b: There are scattered areas of fibroglandular density. FINDINGS: Mammogram: Right breast: Spot compression tomosynthesis views of the right breast were performed demonstrating persistence of a 4 mm irregular mass in the superior central right breast. Ultrasound: Targeted ultrasound performed in the right breast at 12:30 o'clock 6 cm from the nipple demonstrating an irregular hypoechoic mass measuring 0.6 x  0.4 x 0.7 cm. This corresponds to the mass identified mammographically. Targeted ultrasound the right axilla demonstrates normal lymph nodes. IMPRESSION: Suspicious mass in the right breast at 12:30 o'clock measuring 0.7 cm. RECOMMENDATION: Ultrasound-guided core needle biopsy of the right breast mass at 12:30 o'clock. I have discussed the findings and recommendations with the patient who agrees to proceed with biopsy. The patient will be scheduled for the biopsy appointment prior to leaving the office today. BI-RADS CATEGORY  4: Suspicious. Electronically Signed   By: Audie Pinto M.D.   On: 09/30/2020 14:00  MM DIAG BREAST TOMO UNI RIGHT  Result Date: 09/30/2020 CLINICAL DATA:  65 year old female presenting as a recall from screening for possible right breast mass. EXAM: DIGITAL DIAGNOSTIC UNILATERAL RIGHT MAMMOGRAM WITH TOMOSYNTHESIS AND CAD; ULTRASOUND RIGHT BREAST LIMITED TECHNIQUE: Right digital diagnostic mammography and breast tomosynthesis was performed. The images were evaluated with computer-aided detection.; Targeted  ultrasound examination of the right breast was performed COMPARISON:  Previous exam(s). ACR Breast Density Category b: There are scattered areas of fibroglandular density. FINDINGS: Mammogram: Right breast: Spot compression tomosynthesis views of the right breast were performed demonstrating persistence of a 4 mm irregular mass in the superior central right breast. Ultrasound: Targeted ultrasound performed in the right breast at 12:30 o'clock 6 cm from the nipple demonstrating an irregular hypoechoic mass measuring 0.6 x 0.4 x 0.7 cm. This corresponds to the mass identified mammographically. Targeted ultrasound the right axilla demonstrates normal lymph nodes. IMPRESSION: Suspicious mass in the right breast at 12:30 o'clock measuring 0.7 cm. RECOMMENDATION: Ultrasound-guided core needle biopsy of the right breast mass at 12:30 o'clock. I have discussed the findings and  recommendations with the patient who agrees to proceed with biopsy. The patient will be scheduled for the biopsy appointment prior to leaving the office today. BI-RADS CATEGORY  4: Suspicious. Electronically Signed   By: Audie Pinto M.D.   On: 09/30/2020 14:00  MM CLIP PLACEMENT RIGHT  Result Date: 10/04/2020 CLINICAL DATA:  Evaluate biopsy marker EXAM: 3D DIAGNOSTIC RIGHT MAMMOGRAM POST ULTRASOUND BIOPSY COMPARISON:  Previous exam(s). FINDINGS: 3D Mammographic images were obtained following ultrasound guided biopsy of a right breast mass. The biopsy marking clip is in expected position at the site of biopsy. IMPRESSION: Appropriate positioning of the ribbon shaped biopsy marking clip at the site of biopsy in the biopsy 12 30 right breast mass. Final Assessment: Post Procedure Mammograms for Marker Placement Electronically Signed   By: Dorise Bullion III M.D   On: 10/04/2020 13:02  [place] RT BREAST BX W LOC DEV 1ST LESION IMG BX SPEC US GUIDE  Addendum Date: 10/08/2020   ADDENDUM REPORT: 10/07/2020 12:07 ADDENDUM: Pathology revealed GRADE I INVASIVE MAMMARY CARCINOMA, ATYPICAL LOBULAR HYPERPLASIA of the Right breast, 12:30 o'clock, (ribbon clip). This was found to be concordant by Dr. Dorise Bullion. Pathology results were discussed with the patient by telephone. The patient reported doing well after the biopsy with tenderness at the site. Post biopsy instructions and care were reviewed and questions were answered. The patient was encouraged to call [facility] for any additional concerns. My direct phone number was provided. The patient was referred to [facility] on October 16, 2020. Pathology results reported by Terie Purser, RN on 10/07/2020. Electronically Signed   By: Dorise Bullion III M.D   On: 10/07/2020 12:07   Result Date: 10/08/2020 CLINICAL DATA:  Biopsy of a 12 30 right breast mass EXAM:  ULTRASOUND GUIDED RIGHT BREAST CORE NEEDLE BIOPSY COMPARISON:  Previous exam(s). PROCEDURE: I met with the patient and we discussed the procedure of ultrasound-guided biopsy, including benefits and alternatives. We discussed the high likelihood of a successful procedure. We discussed the risks of the procedure, including infection, bleeding, tissue injury, clip migration, and inadequate sampling. Informed written consent was given. The usual time-out protocol was performed immediately prior to the procedure. Lesion quadrant: 12 30, 6 cm from the nipple, right breast Using sterile technique and 1% Lidocaine as local anesthetic, under direct ultrasound visualization, a 12 gauge spring-loaded device was used to perform biopsy of the 12 30 right breast mass using a lateral approach. At the conclusion of the procedure a ribbon shaped tissue marker clip was deployed into the biopsy cavity. Follow up 2 view mammogram was performed and dictated separately. IMPRESSION: Ultrasound guided biopsy of a 12 30 right breast  mass. No apparent complications. Electronically Signed: By: Dorise Bullion III M.D On: 10/04/2020 13:01    ELIGIBLE FOR AVAILABLE RESEARCH PROTOCOL: no  ASSESSMENT: 65 y.o. [place] woman status post right breast upper inner quadrant biopsy 10/04/2020 for clinical T1b N0, stage IA invasive lobular carcinoma, E-cadherin negative, grade 1, estrogen receptor positive, progesterone receptor and HER2 negative, with an MIB-1 of 10%  (1) definitive surgery pending  (2) Oncotype to be obtained from the definitive surgical sample  (3) adjuvant radiation  (4) antiestrogens  PLAN: I met today with Special to review her new diagnosis. Specifically we discussed the biology of her breast cancer, its diagnosis, staging, treatment  options and prognosis. We first reviewed the fact that cancer is not one disease but more than 100 different diseases and that it is important to keep them separate-- otherwise  when friends and relatives discuss their own cancer experiences with Lilianna confusion can result. Similarly we explained that if breast cancer spreads to the bone or liver, the patient would not have bone cancer or liver cancer, but breast cancer in the bone and breast cancer in the liver: one cancer in three places-- not 3 different cancers which otherwise would have to be treated in 3 different ways.  We discussed the difference between local and systemic therapy. In terms of loco-regional treatment, lumpectomy plus radiation is equivalent to mastectomy as far as survival is concerned. For this reason, and because the cosmetic results are generally superior, we recommend breast conserving surgery.   We then discussed the rationale for systemic therapy. There is some risk that this cancer may have already spread to other parts of her body. Patients frequently ask at this point about bone scans, CAT scans and PET scans to find out if they have occult breast cancer somewhere else. The problem is that in early stage disease we are much more likely to find false positives then true cancers and this would expose the patient to unnecessary procedures as well as unnecessary radiation. Scans cannot answer the question the patient really would like to know, which is whether she has microscopic disease elsewhere in her body. For those reasons we do not recommend them.  Of course we would proceed to aggressive evaluation of any symptoms that might suggest metastatic disease, but that is not the case here.  Next we went over the options for systemic therapy which are anti-estrogens, anti-HER-2 immunotherapy, and chemotherapy. Itzel does not meet criteria for anti-HER-2 immunotherapy. She is a good candidate for anti-estrogens.  The question of chemotherapy is more complicated. Chemotherapy is most effective in rapidly growing, aggressive tumors. It is much less effective in low-grade, slow growing cancers, like Neleh 's.  For that reason and because of the negative progesterone receptor status we are going to request an Oncotype from the definitive surgical sample, as suggested by NCCN guidelines.  Neuro understands that we do not expect her to need chemotherapy to have an excellent prognosis .  Meryem has a good understanding of the overall plan. She agrees with it. She knows the goal of treatment in her case is cure. She will call with any problems that may develop before her next visit here.  Total encounter time 65 minutes.Sarajane Jews C. Mickaela Starlin, MD 10/16/2020 5:50 PM Medical Oncology and Hematology [facility] [place], [place] [address] Tel. [phone]    Fax. [phone]   This document serves as a record of services personally performed by Lurline Del,  MD. It was created on his behalf by Wilburn Mylar, a trained medical scribe. The creation of this record is based on the scribe's personal observations and the provider's statements to them.   I, Lurline Del MD, have reviewed the above documentation for accuracy and completeness, and I agree with the above.    *Total Encounter Time as defined by the Centers for Medicare and Medicaid Services includes, in addition to the face-to-face time of a patient visit (documented in the note above) non-face-to-face time: obtaining and reviewing outside history, ordering and reviewing medications, tests or procedures, care coordination (communications with other health care professionals or caregivers) and documentation in the medical record.

## 2020-10-16 NOTE — Patient Instructions (Signed)

## 2020-10-16 NOTE — Progress Notes (Signed)
[place] Work  Initial Assessment   [name] is a 65 y.o. year old female accompanied by husband. Clinical Social Work was referred by [facility] for assessment of psychosocial needs.   SDOH (Social Determinants of Health) assessments performed: Yes SDOH Interventions    Flowsheet Row Most Recent Value  SDOH Interventions   Food Insecurity Interventions Intervention Not Indicated  Financial Strain Interventions Other (Comment)  [cancer foundations]  Transportation Interventions Intervention Not Indicated       Distress Screen completed: Yes ONCBCN DISTRESS SCREENING 10/16/2020  Screening Type Initial Screening  Distress experienced in past week (1-10) 7  Emotional problem type Nervousness/Anxiety      Family/Social Information:  Housing Arrangement: patient lives with husband . 3 adult children live in [place]- 54 in [place], [place]- 34 in [place], [place]- 3 in [place]) Family members/support persons in your life? Family (husband, kids, grandchildren) Transportation concerns: no  Employment: Working full time Clinical research associate. Income source: Employment Financial concerns: Yes, due to illness and/or loss of work during treatment. Not sure what the medical cost will be Type of concern: Medical bills Food access concerns: no Services Currently in place:  n/a  Coping/ Adjustment to diagnosis: Patient understands treatment plan and what happens next? yes, feeling better after meeting with medical team Concerns about diagnosis and/or treatment: How I will pay for the services I need Patient reported stressors: Finances and Anxiety Patient enjoys reading, time with family/ friends, and shopping, the beach, puzzles Current coping skills/ strengths: Capable of independent living, Communication skills, Motivation for treatment/growth, Special hobby/interest, and Supportive family/friends    SUMMARY: Current SDOH Barriers:  Financial constraints related to  treatment  Clinical Social Work Clinical Goal(s):  Patient will utilize resources provided (as eligible) to offset medical costs  Interventions: Discussed common feeling and emotions when being diagnosed with cancer, and the importance of support during treatment Informed patient of the support team roles and support services at [facility] Provided CSW contact information and encouraged patient to call with any questions or concerns Provided patient with information about financial assistance through breast cancer foundations  . Discussed how to know the max out of pocket expense through insurance   Follow Up Plan: Patient will look at cancer foundation information and contact this CSW with questions or for help applying Patient verbalizes understanding of plan: Yes    Christeen Douglas LCSW

## 2020-10-17 ENCOUNTER — Telehealth: Payer: Self-pay | Admitting: Oncology

## 2020-10-17 NOTE — Telephone Encounter (Signed)
Scheduled appointment per 07/27 los. Patient is aware.

## 2020-10-21 ENCOUNTER — Other Ambulatory Visit: Payer: Self-pay | Admitting: General Surgery

## 2020-10-21 ENCOUNTER — Telehealth: Payer: Self-pay | Admitting: *Deleted

## 2020-10-21 ENCOUNTER — Encounter: Payer: Self-pay | Admitting: *Deleted

## 2020-10-21 ENCOUNTER — Telehealth: Payer: Self-pay | Admitting: Radiation Oncology

## 2020-10-21 DIAGNOSIS — Z170 Estrogen receptor positive status [ER+]: Secondary | ICD-10-CM

## 2020-10-21 DIAGNOSIS — C50211 Malignant neoplasm of upper-inner quadrant of right female breast: Secondary | ICD-10-CM

## 2020-10-21 NOTE — Telephone Encounter (Signed)
Spoke with patient to follow up from [facility] and assess navigation needs. Patient states she has a beach trip planned for 10/5-10/9 and was asking when xrt would start.  Informed her that she could see Dr. Isidore Moos and have sim prior to beach trip and then start xrt on 10/10. Referral message sent with this info. Patient denies any other needs or questions at this time.  Encouraged her to call should anything arise. Patient verbalized understanding.

## 2020-10-28 ENCOUNTER — Other Ambulatory Visit: Payer: Self-pay

## 2020-10-28 ENCOUNTER — Encounter (HOSPITAL_BASED_OUTPATIENT_CLINIC_OR_DEPARTMENT_OTHER): Payer: Self-pay | Admitting: General Surgery

## 2020-10-30 ENCOUNTER — Encounter (HOSPITAL_BASED_OUTPATIENT_CLINIC_OR_DEPARTMENT_OTHER)
Admission: RE | Admit: 2020-10-30 | Discharge: 2020-10-30 | Disposition: A | Discharge status: Home / Self Care | Payer: Medicare HMO | Source: Ambulatory Visit | Attending: General Surgery | Admitting: General Surgery

## 2020-10-30 DIAGNOSIS — Z01812 Encounter for preprocedural laboratory examination: Secondary | ICD-10-CM | POA: Insufficient documentation

## 2020-10-30 NOTE — Progress Notes (Signed)
CHG soap given with instruction also. Pt verbalized understanding     Enhanced Recovery after Surgery for Orthopedics Enhanced Recovery after Surgery is a protocol used to improve the stress on your body and your recovery after surgery.  Patient Instructions  The night before surgery:  No food after midnight. ONLY clear liquids after midnight  The day of surgery (if you do NOT have diabetes):  Drink ONE (1) Pre-Surgery Clear Ensure as directed.   This drink was given to you during your hospital  pre-op appointment visit. The pre-op nurse will instruct you on the time to drink the  Pre-Surgery Ensure depending on your surgery time. Finish the drink at the designated time by the pre-op nurse.  Nothing else to drink after completing the  Pre-Surgery Clear Ensure.  The day of surgery (if you have diabetes): Drink ONE (1) Gatorade 2 (G2) as directed. This drink was given to you during your hospital  pre-op appointment visit.  The pre-op nurse will instruct you on the time to drink the   Gatorade 2 (G2) depending on your surgery time. Color of the Gatorade may vary. Red is not allowed. Nothing else to drink after completing the  Gatorade 2 (G2).         If you have questions, please contact your surgeon's office.

## 2020-11-01 DIAGNOSIS — C50911 Malignant neoplasm of unspecified site of right female breast: Secondary | ICD-10-CM | POA: Diagnosis not present

## 2020-11-01 DIAGNOSIS — E782 Mixed hyperlipidemia: Secondary | ICD-10-CM | POA: Diagnosis not present

## 2020-11-01 DIAGNOSIS — G629 Polyneuropathy, unspecified: Secondary | ICD-10-CM | POA: Diagnosis not present

## 2020-11-01 DIAGNOSIS — I10 Essential (primary) hypertension: Secondary | ICD-10-CM | POA: Diagnosis not present

## 2020-11-01 DIAGNOSIS — E1169 Type 2 diabetes mellitus with other specified complication: Secondary | ICD-10-CM | POA: Diagnosis not present

## 2020-11-01 DIAGNOSIS — Z794 Long term (current) use of insulin: Secondary | ICD-10-CM | POA: Diagnosis not present

## 2020-11-01 DIAGNOSIS — F419 Anxiety disorder, unspecified: Secondary | ICD-10-CM | POA: Diagnosis not present

## 2020-11-01 DIAGNOSIS — Z8616 Personal history of COVID-19: Secondary | ICD-10-CM | POA: Diagnosis not present

## 2020-11-01 DIAGNOSIS — G4700 Insomnia, unspecified: Secondary | ICD-10-CM | POA: Diagnosis not present

## 2020-11-04 ENCOUNTER — Other Ambulatory Visit: Payer: Self-pay

## 2020-11-04 ENCOUNTER — Ambulatory Visit
Admission: RE | Admit: 2020-11-04 | Discharge: 2020-11-04 | Disposition: A | Discharge status: Home / Self Care | Payer: Medicare HMO | Source: Ambulatory Visit | Attending: General Surgery | Admitting: General Surgery

## 2020-11-04 DIAGNOSIS — C50211 Malignant neoplasm of upper-inner quadrant of right female breast: Secondary | ICD-10-CM

## 2020-11-04 DIAGNOSIS — R928 Other abnormal and inconclusive findings on diagnostic imaging of breast: Secondary | ICD-10-CM | POA: Diagnosis not present

## 2020-11-05 ENCOUNTER — Other Ambulatory Visit: Payer: Self-pay

## 2020-11-05 ENCOUNTER — Ambulatory Visit (HOSPITAL_BASED_OUTPATIENT_CLINIC_OR_DEPARTMENT_OTHER): Payer: Medicare HMO | Admitting: Certified Registered"

## 2020-11-05 ENCOUNTER — Encounter (HOSPITAL_COMMUNITY)
Admission: RE | Admit: 2020-11-05 | Discharge: 2020-11-05 | Disposition: A | Discharge status: Home / Self Care | Payer: Medicare HMO | Source: Ambulatory Visit | Attending: General Surgery | Admitting: General Surgery

## 2020-11-05 ENCOUNTER — Encounter (HOSPITAL_BASED_OUTPATIENT_CLINIC_OR_DEPARTMENT_OTHER)
Admission: RE | Disposition: A | Discharge status: Home / Self Care | Payer: Self-pay | Source: Home / Self Care | Attending: General Surgery

## 2020-11-05 ENCOUNTER — Ambulatory Visit (HOSPITAL_BASED_OUTPATIENT_CLINIC_OR_DEPARTMENT_OTHER)
Admission: RE | Admit: 2020-11-05 | Discharge: 2020-11-05 | Disposition: A | Discharge status: Home / Self Care | Payer: Medicare HMO | Attending: General Surgery | Admitting: General Surgery

## 2020-11-05 ENCOUNTER — Ambulatory Visit
Admission: RE | Admit: 2020-11-05 | Discharge: 2020-11-05 | Disposition: A | Discharge status: Home / Self Care | Payer: Medicare HMO | Source: Ambulatory Visit | Attending: General Surgery | Admitting: General Surgery

## 2020-11-05 ENCOUNTER — Encounter (HOSPITAL_BASED_OUTPATIENT_CLINIC_OR_DEPARTMENT_OTHER): Payer: Self-pay | Admitting: General Surgery

## 2020-11-05 DIAGNOSIS — Z79899 Other long term (current) drug therapy: Secondary | ICD-10-CM | POA: Diagnosis not present

## 2020-11-05 DIAGNOSIS — C50911 Malignant neoplasm of unspecified site of right female breast: Secondary | ICD-10-CM | POA: Diagnosis not present

## 2020-11-05 DIAGNOSIS — Z7984 Long term (current) use of oral hypoglycemic drugs: Secondary | ICD-10-CM | POA: Insufficient documentation

## 2020-11-05 DIAGNOSIS — Z794 Long term (current) use of insulin: Secondary | ICD-10-CM | POA: Insufficient documentation

## 2020-11-05 DIAGNOSIS — Z170 Estrogen receptor positive status [ER+]: Secondary | ICD-10-CM

## 2020-11-05 DIAGNOSIS — E785 Hyperlipidemia, unspecified: Secondary | ICD-10-CM | POA: Diagnosis not present

## 2020-11-05 DIAGNOSIS — C50211 Malignant neoplasm of upper-inner quadrant of right female breast: Secondary | ICD-10-CM | POA: Insufficient documentation

## 2020-11-05 DIAGNOSIS — Z87891 Personal history of nicotine dependence: Secondary | ICD-10-CM | POA: Diagnosis not present

## 2020-11-05 DIAGNOSIS — R928 Other abnormal and inconclusive findings on diagnostic imaging of breast: Secondary | ICD-10-CM | POA: Diagnosis not present

## 2020-11-05 DIAGNOSIS — G8918 Other acute postprocedural pain: Secondary | ICD-10-CM | POA: Diagnosis not present

## 2020-11-05 HISTORY — DX: Nausea with vomiting, unspecified: Z98.890

## 2020-11-05 HISTORY — PX: BREAST LUMPECTOMY WITH RADIOACTIVE SEED AND SENTINEL LYMPH NODE BIOPSY: SHX6550

## 2020-11-05 HISTORY — DX: Nausea with vomiting, unspecified: R11.2

## 2020-11-05 LAB — GLUCOSE, CAPILLARY
Glucose-Capillary: 160 mg/dL — ABNORMAL HIGH (ref 70–99)
Glucose-Capillary: 181 mg/dL — ABNORMAL HIGH (ref 70–99)

## 2020-11-05 SURGERY — BREAST LUMPECTOMY WITH RADIOACTIVE SEED AND SENTINEL LYMPH NODE BIOPSY
Anesthesia: General | Site: Breast | Laterality: Right

## 2020-11-05 MED ORDER — MIDAZOLAM HCL 2 MG/2ML IJ SOLN
INTRAMUSCULAR | Status: AC
  Filled 2020-11-05: qty 2

## 2020-11-05 MED ORDER — PHENYLEPHRINE HCL (PRESSORS) 10 MG/ML IV SOLN
INTRAVENOUS | Status: DC | PRN
  Administered 2020-11-05 (×5): 80 ug via INTRAVENOUS

## 2020-11-05 MED ORDER — OXYCODONE HCL 5 MG PO TABS
5.0000 mg | ORAL_TABLET | Freq: Once | ORAL | Status: AC | PRN
  Administered 2020-11-05: 5 mg via ORAL

## 2020-11-05 MED ORDER — FENTANYL CITRATE (PF) 100 MCG/2ML IJ SOLN
INTRAMUSCULAR | Status: AC
  Filled 2020-11-05: qty 2

## 2020-11-05 MED ORDER — BUPIVACAINE HCL (PF) 0.25 % IJ SOLN
INTRAMUSCULAR | Status: DC | PRN
  Administered 2020-11-05: 6 mL

## 2020-11-05 MED ORDER — EPHEDRINE SULFATE 50 MG/ML IJ SOLN
INTRAMUSCULAR | Status: DC | PRN
Start: 2020-11-05 — End: 2020-11-05
  Administered 2020-11-05 (×2): 10 mg via INTRAVENOUS

## 2020-11-05 MED ORDER — EPHEDRINE 5 MG/ML INJ
INTRAVENOUS | Status: AC
  Filled 2020-11-05: qty 10

## 2020-11-05 MED ORDER — NON FORMULARY
Status: DC | PRN
  Administered 2020-11-05: 2 mL via INTRAMUSCULAR

## 2020-11-05 MED ORDER — PHENYLEPHRINE 40 MCG/ML (10ML) SYRINGE FOR IV PUSH (FOR BLOOD PRESSURE SUPPORT)
PREFILLED_SYRINGE | INTRAVENOUS | Status: AC
  Filled 2020-11-05: qty 10

## 2020-11-05 MED ORDER — LIDOCAINE HCL (PF) 2 % IJ SOLN
INTRAMUSCULAR | Status: AC
  Filled 2020-11-05: qty 5

## 2020-11-05 MED ORDER — MIDAZOLAM HCL 5 MG/5ML IJ SOLN
INTRAMUSCULAR | Status: DC | PRN
  Administered 2020-11-05: 2 mg via INTRAVENOUS

## 2020-11-05 MED ORDER — DEXAMETHASONE SODIUM PHOSPHATE 10 MG/ML IJ SOLN
INTRAMUSCULAR | Status: AC
  Filled 2020-11-05: qty 1

## 2020-11-05 MED ORDER — HYDROMORPHONE HCL 1 MG/ML IJ SOLN
INTRAMUSCULAR | Status: AC
  Filled 2020-11-05: qty 0.5

## 2020-11-05 MED ORDER — DROPERIDOL 2.5 MG/ML IJ SOLN
INTRAMUSCULAR | Status: AC
  Filled 2020-11-05: qty 2

## 2020-11-05 MED ORDER — DEXAMETHASONE SODIUM PHOSPHATE 10 MG/ML IJ SOLN
INTRAMUSCULAR | Status: DC | PRN
  Administered 2020-11-05: 5 mg via INTRAVENOUS

## 2020-11-05 MED ORDER — CEFAZOLIN SODIUM-DEXTROSE 2-4 GM/100ML-% IV SOLN
2.0000 g | INTRAVENOUS | Status: AC
  Administered 2020-11-05: 2 g via INTRAVENOUS

## 2020-11-05 MED ORDER — ROPIVACAINE HCL 5 MG/ML IJ SOLN
INTRAMUSCULAR | Status: DC | PRN
  Administered 2020-11-05: 30 mL via PERINEURAL

## 2020-11-05 MED ORDER — ONDANSETRON HCL 4 MG/2ML IJ SOLN
INTRAMUSCULAR | Status: DC | PRN
  Administered 2020-11-05: 4 mg via INTRAVENOUS

## 2020-11-05 MED ORDER — FENTANYL CITRATE (PF) 100 MCG/2ML IJ SOLN
INTRAMUSCULAR | Status: DC | PRN
  Administered 2020-11-05 (×4): 25 ug via INTRAVENOUS

## 2020-11-05 MED ORDER — TECHNETIUM TC 99M TILMANOCEPT KIT
1.0000 | PACK | Freq: Once | INTRAVENOUS | Status: AC | PRN
  Administered 2020-11-05: 1 via INTRADERMAL

## 2020-11-05 MED ORDER — ACETAMINOPHEN 500 MG PO TABS
1000.0000 mg | ORAL_TABLET | ORAL | Status: AC
  Administered 2020-11-05: 1000 mg via ORAL

## 2020-11-05 MED ORDER — PROMETHAZINE HCL 25 MG/ML IJ SOLN: 6.2500 mg | INTRAMUSCULAR | Status: DC | PRN

## 2020-11-05 MED ORDER — LIDOCAINE HCL (CARDIAC) PF 100 MG/5ML IV SOSY
PREFILLED_SYRINGE | INTRAVENOUS | Status: DC | PRN
  Administered 2020-11-05: 60 mg via INTRAVENOUS

## 2020-11-05 MED ORDER — ENSURE PRE-SURGERY PO LIQD: 296.0000 mL | Freq: Once | ORAL | Status: DC

## 2020-11-05 MED ORDER — PROPOFOL 10 MG/ML IV BOLUS
INTRAVENOUS | Status: AC
  Filled 2020-11-05: qty 20

## 2020-11-05 MED ORDER — DROPERIDOL 2.5 MG/ML IJ SOLN
INTRAMUSCULAR | Status: DC | PRN
  Administered 2020-11-05: .625 mg via INTRAVENOUS

## 2020-11-05 MED ORDER — OXYCODONE HCL 5 MG/5ML PO SOLN: 5.0000 mg | Freq: Once | ORAL | Status: AC | PRN

## 2020-11-05 MED ORDER — CHLORHEXIDINE GLUCONATE CLOTH 2 % EX PADS: 6.0000 | MEDICATED_PAD | Freq: Once | CUTANEOUS | Status: DC

## 2020-11-05 MED ORDER — HYDROMORPHONE HCL 1 MG/ML IJ SOLN
0.2500 mg | INTRAMUSCULAR | Status: DC | PRN
  Administered 2020-11-05: 0.25 mg via INTRAVENOUS
  Administered 2020-11-05: 0.5 mg via INTRAVENOUS

## 2020-11-05 MED ORDER — ACETAMINOPHEN 500 MG PO TABS
ORAL_TABLET | ORAL | Status: AC
  Filled 2020-11-05: qty 2

## 2020-11-05 MED ORDER — ONDANSETRON HCL 4 MG/2ML IJ SOLN
INTRAMUSCULAR | Status: AC
  Filled 2020-11-05: qty 2

## 2020-11-05 MED ORDER — CEFAZOLIN SODIUM-DEXTROSE 2-4 GM/100ML-% IV SOLN
INTRAVENOUS | Status: AC
  Filled 2020-11-05: qty 100

## 2020-11-05 MED ORDER — PROPOFOL 10 MG/ML IV BOLUS
INTRAVENOUS | Status: DC | PRN
  Administered 2020-11-05: 150 mg via INTRAVENOUS

## 2020-11-05 MED ORDER — MEPERIDINE HCL 25 MG/ML IJ SOLN: 6.2500 mg | INTRAMUSCULAR | Status: DC | PRN

## 2020-11-05 MED ORDER — OXYCODONE HCL 5 MG PO TABS: 5.0000 mg | ORAL_TABLET | ORAL | 0 refills | Status: DC | PRN

## 2020-11-05 MED ORDER — LACTATED RINGERS IV SOLN: INTRAVENOUS | Status: DC

## 2020-11-05 MED ORDER — OXYCODONE HCL 5 MG PO TABS
ORAL_TABLET | ORAL | Status: AC
  Filled 2020-11-05: qty 1

## 2020-11-05 SURGICAL SUPPLY — 54 items
ADH SKN CLS APL DERMABOND .7 (GAUZE/BANDAGES/DRESSINGS) ×1
APL PRP STRL LF DISP 70% ISPRP (MISCELLANEOUS) ×1
APPLIER CLIP 9.375 MED OPEN (MISCELLANEOUS) ×2
APR CLP MED 9.3 20 MLT OPN (MISCELLANEOUS) ×1
BINDER BREAST XLRG (GAUZE/BANDAGES/DRESSINGS) IMPLANT
BINDER BREAST XXLRG (GAUZE/BANDAGES/DRESSINGS) ×1 IMPLANT
BLADE SURG 15 STRL LF DISP TIS (BLADE) ×1 IMPLANT
BLADE SURG 15 STRL SS (BLADE) ×2
CANISTER SUCT 1200ML W/VALVE (MISCELLANEOUS) IMPLANT
CHLORAPREP W/TINT 26 (MISCELLANEOUS) ×2 IMPLANT
CLIP APPLIE 9.375 MED OPEN (MISCELLANEOUS) IMPLANT
CLIP TI WIDE RED SMALL 6 (CLIP) ×2 IMPLANT
COVER BACK TABLE 60X90IN (DRAPES) ×2 IMPLANT
COVER MAYO STAND STRL (DRAPES) ×2 IMPLANT
COVER PROBE W GEL 5X96 (DRAPES) ×2 IMPLANT
DERMABOND ADVANCED (GAUZE/BANDAGES/DRESSINGS) ×1
DERMABOND ADVANCED .7 DNX12 (GAUZE/BANDAGES/DRESSINGS) ×1 IMPLANT
DRAPE LAPAROSCOPIC ABDOMINAL (DRAPES) ×2 IMPLANT
DRAPE UTILITY XL STRL (DRAPES) ×2 IMPLANT
ELECT COATED BLADE 2.86 ST (ELECTRODE) ×2 IMPLANT
ELECT REM PT RETURN 9FT ADLT (ELECTROSURGICAL) ×2
ELECTRODE REM PT RTRN 9FT ADLT (ELECTROSURGICAL) ×1 IMPLANT
GLOVE SURG ENC MOIS LTX SZ7 (GLOVE) ×4 IMPLANT
GLOVE SURG UNDER POLY LF SZ6.5 (GLOVE) ×1 IMPLANT
GLOVE SURG UNDER POLY LF SZ7.5 (GLOVE) ×2 IMPLANT
GOWN STRL REUS W/ TWL LRG LVL3 (GOWN DISPOSABLE) ×2 IMPLANT
GOWN STRL REUS W/TWL LRG LVL3 (GOWN DISPOSABLE) ×4
HEMOSTAT ARISTA ABSORB 3G PWDR (HEMOSTASIS) IMPLANT
ILLUMINATOR WAVEGUIDE N/F (MISCELLANEOUS) ×1 IMPLANT
KIT MARKER MARGIN INK (KITS) ×2 IMPLANT
NDL HYPO 25X1 1.5 SAFETY (NEEDLE) ×1 IMPLANT
NDL SAFETY ECLIPSE 18X1.5 (NEEDLE) IMPLANT
NEEDLE HYPO 18GX1.5 SHARP (NEEDLE) ×2
NEEDLE HYPO 25X1 1.5 SAFETY (NEEDLE) ×4 IMPLANT
NS IRRIG 1000ML POUR BTL (IV SOLUTION) IMPLANT
PACK BASIN DAY SURGERY FS (CUSTOM PROCEDURE TRAY) ×2 IMPLANT
PENCIL SMOKE EVACUATOR (MISCELLANEOUS) ×2 IMPLANT
SLEEVE SCD COMPRESS KNEE MED (STOCKING) ×2 IMPLANT
SPONGE T-LAP 4X18 ~~LOC~~+RFID (SPONGE) ×2 IMPLANT
STRIP CLOSURE SKIN 1/2X4 (GAUZE/BANDAGES/DRESSINGS) ×2 IMPLANT
SUT ETHILON 2 0 FS 18 (SUTURE) IMPLANT
SUT MNCRL AB 4-0 PS2 18 (SUTURE) ×2 IMPLANT
SUT MON AB 5-0 PS2 18 (SUTURE) ×1 IMPLANT
SUT SILK 2 0 SH (SUTURE) ×1 IMPLANT
SUT VIC AB 2-0 SH 27 (SUTURE) ×6
SUT VIC AB 2-0 SH 27XBRD (SUTURE) ×1 IMPLANT
SUT VIC AB 3-0 SH 27 (SUTURE) ×4
SUT VIC AB 3-0 SH 27X BRD (SUTURE) ×1 IMPLANT
SYR CONTROL 10ML LL (SYRINGE) ×3 IMPLANT
TOWEL GREEN STERILE FF (TOWEL DISPOSABLE) ×2 IMPLANT
TRACER MAGTRACE VIAL (MISCELLANEOUS) ×1 IMPLANT
TRAY FAXITRON CT DISP (TRAY / TRAY PROCEDURE) ×2 IMPLANT
TUBE CONNECTING 20X1/4 (TUBING) IMPLANT
YANKAUER SUCT BULB TIP NO VENT (SUCTIONS) IMPLANT

## 2020-11-05 NOTE — H&P (Signed)
65 y.o. female who is seen today as an office consultation at the request of Dr. Moreen Fowler for new right breast cancer. She has no prior history of breast disease. She has no family history. She had no mass or discharge. She underwent a screening mammogram that showed a central right breast mass. On ultrasound this was 7 x 6 x 4 mm. Her axillary ultrasound was negative. On biopsy this is a grade 1 invasive lobular carcinoma with atypical lobular hyperplasia that is 90% ER positive, PR negative, HER2 negative, Ki-67 is 10%. She is here with her husband to discuss options today.  Review of Systems: A complete review of systems was obtained from the patient. I have reviewed this information and discussed as appropriate with the patient. See HPI as well for other ROS.  Review of Systems  All other systems reviewed and are negative.   Medical History: History reviewed. No pertinent past medical history.  Patient Active Problem List  Diagnosis   Diabetes mellitus (CMS-HCC)   Diverticulitis   Kidney stone   Malignant neoplasm of upper-inner quadrant of right breast in female, estrogen receptor positive (CMS-HCC)   Essential hypertension   Pure hypercholesterolemia   Past Surgical History:  Procedure Laterality Date   ABDOMINAL HYSTERECTOMY   LAPAROSCOPIC CHOLECYSTECTOMY    No Known Allergies  Current Outpatient Medications on File Prior to Visit  Medication Sig Dispense Refill   CONCENTRATED insulin glargine (TOUJEO SOLOSTAR U-300 INSULIN) pen injector (concentration 300 units/mL) 30u   amitriptyline (ELAVIL) 25 MG tablet amitriptyline 25 mg tablet TAKE 1 TABLET BY MOUTH AT BEDTIME   amitriptyline (ELAVIL) 25 MG tablet Take 1 tablet by mouth nightly   blood glucose diagnostic (ACCU-CHEK GUIDE TEST STRIPS) test strip Accu-Chek Guide test strips USE AS DIRECTED 3 TIMES A DAY   CONCENTRATED insulin glargine (TOUJEO SOLOSTAR U-300 INSULIN) pen injector (concentration 300 units/mL) Toujeo  SoloStar U-300 Insulin 300 unit/mL (1.5 mL) subcutaneous pen INJECT 30U SUBCUTANEOUS ONCE A DAY 30 DAYS   dulaglutide (TRULICITY) 9.62 EZ/6.6 mL pen injector Inject subcutaneously   insulin GLARGINE (LANTUS) injection (concentration 100 units/mL) Inject subcutaneously   losartan (COZAAR) 25 MG tablet losartan 25 mg tablet TAKE 1 TABLET BY MOUTH EVERY DAY   losartan (COZAAR) 25 MG tablet 1 tablet   metFORMIN (GLUCOPHAGE) 1000 MG tablet Take by mouth   metFORMIN (GLUCOPHAGE) 500 MG tablet metformin 500 mg tablet TAKE 2 TABLETS BY MOUTH TWICE A DAY   sertraline (ZOLOFT) 50 MG tablet sertraline 50 mg tablet TAKE 1 TABLET BY MOUTH EVERY DAY   simvastatin (ZOCOR) 10 MG tablet Take 10 mg by mouth once daily   simvastatin (ZOCOR) 40 MG tablet simvastatin 40 mg tablet TAKE 1 TABLET BY MOUTH EVERY EVENING   No current facility-administered medications on file prior to visit.   Family History  Problem Relation Age of Onset   Breast cancer Neg Hx    Social History   Tobacco Use  Smoking Status Never Smoker  Smokeless Tobacco Never Used    Social History   Socioeconomic History   Marital status: Married  Tobacco Use   Smoking status: Never Smoker   Smokeless tobacco: Never Used  Substance and Sexual Activity   Alcohol use: Not Currently   Objective:   There were no vitals filed for this visit.  There is no height or weight on file to calculate BMI.  Physical Exam Constitutional:  Appearance: Normal appearance.  Cardiovascular:  Rate and Rhythm: Normal rate.  Pulmonary:  Effort:  Pulmonary effort is normal.  Chest:  Breasts:  Right: Normal. No mass or nipple discharge.  Left: Normal. No mass or nipple discharge.   Lymphadenopathy:  Upper Body:  Right upper body: No supraclavicular or axillary adenopathy.  Left upper body: No supraclavicular or axillary adenopathy.  Neurological:  Mental Status: She is alert.     Assessment and Plan:  Diagnoses and all orders for  this visit:  Malignant neoplasm of upper-inner quadrant of right breast in female, estrogen receptor positive (CMS-HCC)  Right breast seed guided lumpectomy, right axillary sentinel node biopsy  We discussed the staging and pathophysiology of breast cancer. We discussed all of the different options for treatment for breast cancer including surgery, chemotherapy, radiation therapy, Herceptin, and antiestrogen therapy. We discussed a sentinel lymph node biopsy as she does not appear to having lymph node involvement right now. We discussed the performance of that with injection of radioactive tracer. We discussed that there is a chance of having a positive node with a sentinel lymph node biopsy and we will await the permanent pathology to make any other first further decisions in terms of her treatment. We discussed up to a 5% risk lifetime of chronic shoulder pain as well as lymphedema associated with a sentinel lymph node biopsy. We discussed the options for treatment of the breast cancer which included lumpectomy versus a mastectomy. We discussed the performance of the lumpectomy with radioactive seed placement. We discussed a 5-10% chance of a positive margin requiring reexcision in the operating room. We also discussed that she will likely need radiation therapy if she undergoes lumpectomy. We discussed mastectomy and the postoperative care for that as well. Mastectomy can be followed by reconstruction. The decision for lumpectomy vs mastectomy has no impact on decision for chemotherapy. Most mastectomy patients will not need radiation therapy. We discussed that there is no difference in her survival whether she undergoes lumpectomy with radiation therapy or antiestrogen therapy versus a mastectomy. There is also no real difference between her recurrence in the breast. We discussed the risks of operation including bleeding, infection, possible reoperation. She understands her further therapy will be based  on what her stages at the time of her operation.

## 2020-11-05 NOTE — Anesthesia Procedure Notes (Signed)
Anesthesia Regional Block: Pectoralis block   Pre-Anesthetic Checklist: , timeout performed,  Correct Patient, Correct Site, Correct Laterality,  Correct Procedure, Correct Position, site marked,  Risks and benefits discussed,  Surgical consent,  Pre-op evaluation,  At surgeon's request and post-op pain management  Laterality: Right  Prep: chloraprep       Needles:  Injection technique: Single-shot  Needle Type: Stimiplex     Needle Length: 9cm  Needle Gauge: 21     Additional Needles:   Procedures:,,,, ultrasound used (permanent image in chart),,    Narrative:  Start time: 11/05/2020 12:35 PM End time: 11/05/2020 12:40 PM Injection made incrementally with aspirations every 5 mL.  Performed by: Personally  Anesthesiologist: Lynda Rainwater, MD

## 2020-11-05 NOTE — Progress Notes (Signed)
Nuc med inj performed by nuc med staff. Pt tol well with no sedation. VSS. Emotional support provided.

## 2020-11-05 NOTE — Op Note (Signed)
Preoperative diagnosis: Clinical stage I right breast cancer Postoperative diagnosis: Same as above Procedure: 1.  Right breast radioactive seed guided lumpectomy 2.  Right axillary sentinel lymph node biopsy, deep Surgeon: Dr. Serita Grammes Anesthesia: General with a pectoral block Estimated blood loss: 20 cc Complications: None Drains: None Specimens: 1.  Right breast tissue marked with paint containing seed and clip 2.  Additional anterior, superio and medial margins marked short superior, long lateral, double deep 3.  Right deep axillary sentinel lymph nodes  Sponge and count was correct completion Decision to recovery stable condition   Indications:  65 y.o. female who underwent a screening mammogram that showed a central right breast mass. On ultrasound this was 7 x 6 x 4 mm. Her axillary ultrasound was negative. On biopsy this is a grade 1 invasive lobular carcinoma with atypical lobular hyperplasia that is 90% ER positive, PR negative, HER2 negative, Ki-67 is 10%. We discussed lumpectomy and sentinel node biopsy.     Procedure: After informed consent she was  injected with Lymphoseek in the standard periareolar fashion.  She was given antibiotics.  SCDs were placed.  She was then placed under general anesthesia without complication.  She was prepped and draped in standard sterile surgical fashion.  Surgical timeout was then performed.   I then injected 2 cc of magtrace and massaged the breast for 5 minutes.  The pectoral block was then done.    The seed was in the central breast.  I infiltrated Marcaine and made a periareolar incision to hide the scar later.  I then dissected to the seed was able to identify the seed.  I removed the seed the palpable mass and the surrounding tissue in order to get a clear margin.  I then marked this with paint and passed off the table.  The seed and clip were present in the specimen.  I then removed additional margins as above as these appeared close  to me. I then closed the cavity with 2-0 Vicryl.  The skin was closed with 3-0 Vicryl 5-0 Monocryl.  Glue and Steri-Strips were later applied.  I was able to identify radioactivity and magtrace in the axilla.  I then infiltrated Marcaine and made an incision.  I carried this through the axillary fascia.  I was able to identify what appeared to be a couple of radioactive nodes. These also had magtrace in them.   I removed these and passed these off the table.  There is no real background radioactivity.  I obtained hemostasis.  I then closed the axillary fascia with 2-0 Vicryl.  The skin was closed with 3-0 Vicryl and 4-0 Monocryl.  Glue and Steri-Strips were applied.  She tolerated this well was extubated and transferred to recovery stable

## 2020-11-05 NOTE — Interval H&P Note (Signed)
History and Physical Interval Note:  11/05/2020 12:09 PM  [name]  has presented today for surgery, with the diagnosis of RIGHT BREAST CANCER.  The various methods of treatment have been discussed with the patient and family. After consideration of risks, benefits and other options for treatment, the patient has consented to  Procedure(s) with comments: RIGHT BREAST LUMPECTOMY WITH RADIOACTIVE SEED AND RIGHT AXILLARY SENTINEL LYMPH NODE BIOPSY (Right) - 60 MINUTES ROOM 8 as a surgical intervention.  The patient's history has been reviewed, patient examined, no change in status, stable for surgery.  I have reviewed the patient's chart and labs.  Questions were answered to the patient's satisfaction.     Rolm Bookbinder

## 2020-11-05 NOTE — Anesthesia Procedure Notes (Signed)
Procedure Name: LMA Insertion Date/Time: 11/05/2020 12:23 PM Performed by: Lavonia Dana, CRNA Pre-anesthesia Checklist: Patient identified, Emergency Drugs available, Suction available and Patient being monitored Patient Re-evaluated:Patient Re-evaluated prior to induction Oxygen Delivery Method: Circle system utilized Preoxygenation: Pre-oxygenation with 100% oxygen Induction Type: IV induction Ventilation: Mask ventilation without difficulty LMA: LMA inserted LMA Size: 4.0 Number of attempts: 1 Airway Equipment and Method: Bite block Placement Confirmation: positive ETCO2 Tube secured with: Tape Dental Injury: Teeth and Oropharynx as per pre-operative assessment

## 2020-11-05 NOTE — Transfer of Care (Signed)
Immediate Anesthesia Transfer of Care Note  Patient: [name]  Procedure(s) Performed: RIGHT BREAST LUMPECTOMY WITH RADIOACTIVE SEED AND RIGHT AXILLARY SENTINEL LYMPH NODE BIOPSY (Right: Breast)  Patient Location: PACU  Anesthesia Type:GA combined with regional for post-op pain  Level of Consciousness: drowsy  Airway & Oxygen Therapy: Patient Spontanous Breathing and Patient connected to face mask oxygen  Post-op Assessment: Report given to RN and Post -op Vital signs reviewed and stable  Post vital signs: Reviewed and stable  Last Vitals:  Vitals Value Taken Time  BP 165/74 11/05/20 1411  Temp    Pulse 95 11/05/20 1413  Resp 15 11/05/20 1413  SpO2 99 % 11/05/20 1413  Vitals shown include unvalidated device data.  Last Pain:  Vitals:   11/05/20 1115  TempSrc: Oral  PainSc: 0-No pain      Patients Stated Pain Goal: 6 ([id number])  Complications: No notable events documented.

## 2020-11-05 NOTE — Discharge Instructions (Addendum)
[facility] Office Phone Number [phone]  BREAST BIOPSY/ PARTIAL MASTECTOMY: POST OP INSTRUCTIONS Take 400 mg of ibuprofen every 8 hours or 650 mg tylenol every 6 hours for next 72 hours then as needed. Use ice several times daily also. Always review your discharge instruction sheet given to you by the facility where your surgery was performed.  IF YOU HAVE DISABILITY OR FAMILY LEAVE FORMS, YOU MUST BRING THEM TO THE OFFICE FOR PROCESSING.  DO NOT GIVE THEM TO YOUR DOCTOR.  A prescription for pain medication may be given to you upon discharge.  Take your pain medication as prescribed, if needed.  If narcotic pain medicine is not needed, then you may take acetaminophen (Tylenol), naprosyn (Alleve) or ibuprofen (Advil) as needed. Take your usually prescribed medications unless otherwise directed If you need a refill on your pain medication, please contact your pharmacy.  They will contact our office to request authorization.  Prescriptions will not be filled after 5pm or on week-ends. You should eat very light the first 24 hours after surgery, such as soup, crackers, pudding, etc.  Resume your normal diet the day after surgery. Most patients will experience some swelling and bruising in the breast.  Ice packs and a good support bra will help.  Wear the breast binder provided or a sports bra for 72 hours day and night.  After that wear a sports bra during the day until you return to the office. Swelling and bruising can take several days to resolve.  It is common to experience some constipation if taking pain medication after surgery.  Increasing fluid intake and taking a stool softener will usually help or prevent this problem from occurring.  A mild laxative (Milk of Magnesia or Miralax) should be taken according to package directions if there are no bowel movements after 48 hours. Unless discharge instructions indicate otherwise, you may remove your bandages 48 hours after surgery  and you may shower at that time.  You may have steri-strips (small skin tapes) in place directly over the incision.  These strips should be left on the skin for 7-10 days and will come off on their own.  If your surgeon used skin glue on the incision, you may shower in 24 hours.  The glue will flake off over the next 2-3 weeks.  Any sutures or staples will be removed at the office during your follow-up visit. ACTIVITIES:  You may resume regular daily activities (gradually increasing) beginning the next day.  Wearing a good support bra or sports bra minimizes pain and swelling.  You may have sexual intercourse when it is comfortable. You may drive when you no longer are taking prescription pain medication, you can comfortably wear a seatbelt, and you can safely maneuver your car and apply brakes. RETURN TO WORK:  ______________________________________________________________________________________ You should see your doctor in the office for a follow-up appointment approximately two weeks after your surgery.  Your doctor's nurse will typically make your follow-up appointment when she calls you with your pathology report.  Expect your pathology report 3-4 business days after your surgery.  You may call to check if you do not hear from us after three days. OTHER INSTRUCTIONS: _______________________________________________________________________________________________ _____________________________________________________________________________________________________________________________________ _____________________________________________________________________________________________________________________________________ _____________________________________________________________________________________________________________________________________  WHEN TO CALL DR WAKEFIELD: Fever over 101.0 Nausea and/or vomiting. Extreme swelling or bruising. Continued bleeding from incision. Increased  pain, redness, or drainage from the incision.  The clinic staff is available to answer your questions during regular business hours.  Please don't hesitate to call   and ask to speak to one of the nurses for clinical concerns.  If you have a medical emergency, go to the nearest emergency room or call 911.  A surgeon from [facility] is always on call at the hospital.  For further questions, please visit [email or web address]  No tylenol until after 5:30pm today.  Post Anesthesia Home Care Instructions  Activity: Get plenty of rest for the remainder of the day. A responsible individual must stay with you for 24 hours following the procedure.  For the next 24 hours, DO NOT: -Drive a car -Paediatric nurse -Drink alcoholic beverages -Take any medication unless instructed by your physician -Make any legal decisions or sign important papers.  Meals: Start with liquid foods such as gelatin or soup. Progress to regular foods as tolerated. Avoid greasy, spicy, heavy foods. If nausea and/or vomiting occur, drink only clear liquids until the nausea and/or vomiting subsides. Call your physician if vomiting continues.  Special Instructions/Symptoms: Your throat may feel dry or sore from the anesthesia or the breathing tube placed in your throat during surgery. If this causes discomfort, gargle with warm salt water. The discomfort should disappear within 24 hours.  If you had a scopolamine patch placed behind your ear for the management of post- operative nausea and/or vomiting:  1. The medication in the patch is effective for 72 hours, after which it should be removed.  Wrap patch in a tissue and discard in the trash. Wash hands thoroughly with soap and water. 2. You may remove the patch earlier than 72 hours if you experience unpleasant side effects which may include dry mouth, dizziness or visual disturbances. 3. Avoid touching the patch. Wash your hands with soap and water  after contact with the patch.

## 2020-11-05 NOTE — Anesthesia Postprocedure Evaluation (Signed)
Anesthesia Post Note  Patient: [name]  Procedure(s) Performed: RIGHT BREAST LUMPECTOMY WITH RADIOACTIVE SEED AND RIGHT AXILLARY SENTINEL LYMPH NODE BIOPSY (Right: Breast)     Patient location during evaluation: PACU Anesthesia Type: General Level of consciousness: awake and alert Pain management: pain level controlled Vital Signs Assessment: post-procedure vital signs reviewed and stable Respiratory status: spontaneous breathing, nonlabored ventilation and respiratory function stable Cardiovascular status: blood pressure returned to baseline and stable Postop Assessment: no apparent nausea or vomiting Anesthetic complications: no   No notable events documented.  Last Vitals:  Vitals:   11/05/20 1515 11/05/20 1526  BP: (!) 177/87 (!) 172/76  Pulse: (!) 101 99  Resp: 13 (!) 8  Temp:    SpO2: 93% 94%    Last Pain:  Vitals:   11/05/20 1508  TempSrc:   PainSc: [place]

## 2020-11-05 NOTE — Anesthesia Preprocedure Evaluation (Signed)
Anesthesia Evaluation  Patient identified by MRN, date of birth, ID band Patient awake    Reviewed: Allergy & Precautions, NPO status , Patient's Chart, lab work & pertinent test results  History of Anesthesia Complications (+) PONV  Airway Mallampati: II  TM Distance: >3 FB Neck ROM: Full    Dental no notable dental hx.    Pulmonary neg pulmonary ROS, former smoker,    Pulmonary exam normal breath sounds clear to auscultation       Cardiovascular hypertension, Pt. on medications negative cardio ROS Normal cardiovascular exam Rhythm:Regular Rate:Normal     Neuro/Psych negative neurological ROS  negative psych ROS   GI/Hepatic negative GI ROS, Neg liver ROS,   Endo/Other  negative endocrine ROSdiabetes, Type 2  Renal/GU negative Renal ROS  negative genitourinary   Musculoskeletal negative musculoskeletal ROS (+)   Abdominal (+) + obese,   Peds negative pediatric ROS (+)  Hematology negative hematology ROS (+)   Anesthesia Other Findings Breast Cancer  Reproductive/Obstetrics negative OB ROS                             Anesthesia Physical Anesthesia Plan  ASA: 3  Anesthesia Plan: General   Post-op Pain Management:  Regional for Post-op pain   Induction: Intravenous  PONV Risk Score and Plan: 4 or greater and Ondansetron, Dexamethasone, Midazolam, Droperidol and Treatment may vary due to age or medical condition  Airway Management Planned: LMA  Additional Equipment:   Intra-op Plan:   Post-operative Plan: Extubation in OR  Informed Consent: I have reviewed the patients History and Physical, chart, labs and discussed the procedure including the risks, benefits and alternatives for the proposed anesthesia with the patient or authorized representative who has indicated his/her understanding and acceptance.     Dental advisory given  Plan Discussed with: CRNA  Anesthesia  Plan Comments:         Anesthesia Quick Evaluation

## 2020-11-06 NOTE — Addendum Note (Signed)
Addendum  created 11/06/20 1148 by Analei Whinery, Ernesta Amble, CRNA   Charge Capture section accepted

## 2020-11-07 ENCOUNTER — Encounter (HOSPITAL_BASED_OUTPATIENT_CLINIC_OR_DEPARTMENT_OTHER): Payer: Self-pay | Admitting: General Surgery

## 2020-11-11 ENCOUNTER — Encounter: Payer: Self-pay | Admitting: *Deleted

## 2020-11-12 LAB — SURGICAL PATHOLOGY

## 2020-11-13 ENCOUNTER — Encounter: Payer: Self-pay | Admitting: *Deleted

## 2020-11-13 ENCOUNTER — Telehealth: Payer: Self-pay | Admitting: *Deleted

## 2020-11-13 NOTE — Telephone Encounter (Signed)
Ordered oncotype per Dr. [name]. Faxed requisition to pathology [facility].

## 2020-11-22 DIAGNOSIS — E1165 Type 2 diabetes mellitus with hyperglycemia: Secondary | ICD-10-CM | POA: Diagnosis not present

## 2020-11-22 DIAGNOSIS — G629 Polyneuropathy, unspecified: Secondary | ICD-10-CM | POA: Diagnosis not present

## 2020-11-22 DIAGNOSIS — I10 Essential (primary) hypertension: Secondary | ICD-10-CM | POA: Diagnosis not present

## 2020-11-22 DIAGNOSIS — N200 Calculus of kidney: Secondary | ICD-10-CM | POA: Diagnosis not present

## 2020-11-22 DIAGNOSIS — E7800 Pure hypercholesterolemia, unspecified: Secondary | ICD-10-CM | POA: Diagnosis not present

## 2020-11-29 ENCOUNTER — Encounter: Payer: Self-pay | Admitting: *Deleted

## 2020-12-02 ENCOUNTER — Encounter: Payer: Self-pay | Admitting: Physical Therapy

## 2020-12-02 ENCOUNTER — Ambulatory Visit: Payer: Medicare HMO | Attending: General Surgery | Admitting: Physical Therapy

## 2020-12-02 ENCOUNTER — Encounter: Payer: Self-pay | Admitting: *Deleted

## 2020-12-02 ENCOUNTER — Other Ambulatory Visit: Payer: Self-pay

## 2020-12-02 DIAGNOSIS — R293 Abnormal posture: Secondary | ICD-10-CM | POA: Diagnosis not present

## 2020-12-02 DIAGNOSIS — M25611 Stiffness of right shoulder, not elsewhere classified: Secondary | ICD-10-CM | POA: Insufficient documentation

## 2020-12-02 DIAGNOSIS — C50211 Malignant neoplasm of upper-inner quadrant of right female breast: Secondary | ICD-10-CM | POA: Diagnosis not present

## 2020-12-02 DIAGNOSIS — Z483 Aftercare following surgery for neoplasm: Secondary | ICD-10-CM | POA: Insufficient documentation

## 2020-12-02 DIAGNOSIS — Z170 Estrogen receptor positive status [ER+]: Secondary | ICD-10-CM | POA: Insufficient documentation

## 2020-12-02 NOTE — Therapy (Signed)
[place], [place], [address] Phone: [phone]   Fax:  [phone]  Physical Therapy Treatment  Patient Details  Name: [name] MRN: [id number] Date of Birth: December 26, 1955 Referring Provider (PT): Dr. Rolm Bookbinder   Encounter Date: 12/02/2020   PT End of Session - 12/02/20 1544     Visit Number 2    Number of Visits 10    Date for PT Re-Evaluation 12/30/20    PT Start Time 1502    PT Stop Time 1550    PT Time Calculation (min) 48 min    Activity Tolerance Patient tolerated treatment well    Behavior During Therapy [facility] for tasks assessed/performed             Past Medical History:  Diagnosis Date   Cataract    removed   Diabetes mellitus without complication ([place])    Hyperlipidemia    Hypertension    PONV (postoperative nausea and vomiting)     Past Surgical History:  Procedure Laterality Date   ABDOMINAL HYSTERECTOMY     BREAST LUMPECTOMY WITH RADIOACTIVE SEED AND SENTINEL LYMPH NODE BIOPSY Right 11/05/2020   Procedure: RIGHT BREAST LUMPECTOMY WITH RADIOACTIVE SEED AND RIGHT AXILLARY SENTINEL LYMPH NODE BIOPSY;  Surgeon: Rolm Bookbinder, MD;  Location: [facility];  Service: General;  Laterality: Right;   CHOLECYSTECTOMY     KNEE SURGERY      There were no vitals filed for this visit.   Subjective Assessment - 12/02/20 1506     Subjective Patient reports she underwent a right lumpectomy and sentinel node biopsy (2 negative nodes) on 11/05/2020. She got an infection and wound at her surgery site. The wound was packed and antibiotics started on 11/29/2020.    Pertinent History Patient was diagnosed on 09/30/2020 with right grade I invasive lobular carcinoma breast cancer. She underwent a right lumpectomy and sentinel node biopsy (2 negative nodes) on 11/05/2020. It is ER positive, PR negative and HER2 negative with a Ki67 of 10%.    Patient Stated Goals See how my  arm is doing    Currently in Pain? No/denies                [facility] PT Assessment - 12/02/20 0001       Assessment   Medical Diagnosis s/p right lumpectomy and SLNB    Referring Provider (PT) Dr. Rolm Bookbinder    Onset Date/Surgical Date 11/05/20    Hand Dominance Right    Prior Therapy Baselines      Precautions   Precautions Other (comment)    Precaution Comments recent surgery, right breast infection, and rigt arm lymphedema risk      Restrictions   Weight Bearing Restrictions No      Balance Screen   Has the patient fallen in the past 6 months No    Has the patient had a decrease in activity level because of a fear of falling?  No    Is the patient reluctant to leave their home because of a fear of falling?  No      Home Environment   Living Environment Private residence    Living Arrangements Spouse/significant other    Available Help at Discharge Family      Prior Function   Level of [place] Full time employment    Vocation Requirements Professor for [employer] She is not currently exercising  Cognition   Overall Cognitive Status Within Functional Limits for tasks assessed      Observation/Other Assessments   Observations Patient with moderate edema present in right breast. Wound at her incision site is covered with a gauze pad and there is packing present at the wound site which appears to be coming out.      Posture/Postural Control   Posture/Postural Control Postural limitations    Postural Limitations Rounded Shoulders;Forward head      AROM   AROM Assessment Site Shoulder    Right/Left Shoulder Right    Right Shoulder Extension 40 Degrees    Right Shoulder Flexion 136 Degrees    Right Shoulder ABduction 141 Degrees    Right Shoulder Internal Rotation 70 Degrees    Right Shoulder External Rotation 72 Degrees               LYMPHEDEMA/ONCOLOGY QUESTIONNAIRE - 12/02/20 0001        Type   Cancer Type Right breast cancer      Surgeries   Lumpectomy Date 11/05/20    Sentinel Lymph Node Biopsy Date 11/05/20    Number Lymph Nodes Removed 2      Treatment   Active Chemotherapy Treatment No    Past Chemotherapy Treatment No    Active Radiation Treatment No    Past Radiation Treatment No    Current Hormone Treatment No    Past Hormone Therapy No      What other symptoms do you have   Are you Having Heaviness or Tightness Yes    Are you having Pain No    Are you having pitting edema No    Is it Hard or Difficult finding clothes that fit No    Do you have infections Yes    Comments right breast 11/29/2020    Is there Decreased scar mobility Yes    [place] Sign No      Lymphedema Assessments   Lymphedema Assessments Upper extremities      Right Upper Extremity Lymphedema   10 cm Proximal to Olecranon Process 30.2 cm    Olecranon Process 27.2 cm    10 cm Proximal to Ulnar Styloid Process 21.9 cm    Just Proximal to Ulnar Styloid Process 16.3 cm    Across Hand at [employer] 19.4 cm    At [place] of 2nd Digit 6.4 cm      Left Upper Extremity Lymphedema   10 cm Proximal to Olecranon Process 32 cm    Olecranon Process 27.6 cm    10 cm Proximal to Ulnar Styloid Process 21.8 cm    Just Proximal to Ulnar Styloid Process 16 cm    Across Hand at [employer] 18.3 cm    At [place] of 2nd Digit 6.3 cm                Quick Dash - 12/02/20 0001     Open a tight or new jar Mild difficulty    Do heavy household chores (wash walls, wash floors) Severe difficulty    Carry a shopping bag or briefcase Mild difficulty    Wash your back Moderate difficulty    Use a knife to cut food No difficulty    Recreational activities in which you take some force or impact through your arm, shoulder, or hand (golf, hammering, tennis) Unable    During the past week, to what extent has your arm, shoulder or hand problem interfered with your normal social activities  with  family, friends, neighbors, or groups? Quite a bit    During the past week, to what extent has your arm, shoulder or hand problem limited your work or other regular daily activities [place]    Arm, shoulder, or hand pain. Mild    Tingling (pins and needles) in your arm, shoulder, or hand Mild    Difficulty Sleeping Mild difficulty    DASH Score 43.18 %                             PT Education - 12/02/20 1544     Education Details Aftercare; HEP; lymphedema education    Person(s) Educated Patient;Spouse    Methods Explanation;Demonstration;Handout    Comprehension Returned demonstration;Verbalized understanding                 PT Long Term Goals - 12/02/20 1716       PT LONG TERM GOAL #1   Title Patient will demonstrate she has regained full shoulder ROM and function post operatively compared to baselines.    Time 4    Period Weeks    Status On-going    Target Date 12/30/20      PT LONG TERM GOAL #2   Title Patient will increase right shoulder active flexion to >/= 150 degrees for increased ease reaching overhead.    Baseline 136 post op; 156 pre-op    Time 4    Period Weeks    Status New    Target Date 12/30/20      PT LONG TERM GOAL #3   Title Patient will increase right shoulder active abduction to >/= 160 degrees for ability to obtain radiation positioning with ease.    Baseline 141 post op and 166 pre-op    Time 4    Period Weeks    Status New    Target Date 12/30/20      PT LONG TERM GOAL #4   Title Patient will verbalize good understanding of lymphedema risk reduction practices.    Time 4    Period Weeks    Status New    Target Date 12/30/20      PT LONG TERM GOAL #5   Title Patient will improve her DASH score to </= 8 for improved overall upper extremity function.    Baseline 43.18 post op and 0 pre-op    Time 4    Period Weeks    Status New    Target Date 12/30/20                   Plan - 12/02/20 1710      Clinical Impression Statement Patient is almost 1 month s/p right lumpectomy and sentinel node biopsy on 11/05/2020 with 2 negative axillary nodes removed. She was doing well and then developed an infection and wound a ther incision site. She began antibiotics on 11/29/2020 and her surgeon packed the wound area which appears to have some tunneling. She is awaiting Oncotype results. While she was at PT today, the PT contacted the nurse navigator to question when her Oncotype results would be back as it has been several weeks. She reported the company requires an additional tissue sample so there will be a delay in getting those results back. She will at minimum require radiation.    PT Frequency 2x / week    PT Duration 4 weeks    PT Treatment/Interventions ADLs/Self Care Home Management;Therapeutic exercise;Patient/family education;Manual  lymph drainage;Manual techniques;Passive range of motion;Scar mobilization;Therapeutic activities;DME Instruction    PT Next Visit Plan Begin gentle ROM exercises; on hold x1 week to allow more healing time.    PT Home Exercise Plan Post op shoulder ROM HEP    Consulted and Agree with Plan of Care Patient;Family member/caregiver    Family Member Consulted Husband             Patient will benefit from skilled therapeutic intervention in order to improve the following deficits and impairments:  Postural dysfunction, Decreased range of motion, Decreased knowledge of precautions, Impaired UE functional use, Pain, Increased edema, Increased fascial restricitons  Visit Diagnosis: Malignant neoplasm of upper-inner quadrant of right breast in female, estrogen receptor positive ([place]) - Plan: PT plan of care cert/re-cert  Abnormal posture - Plan: PT plan of care cert/re-cert  Aftercare following surgery for neoplasm - Plan: PT plan of care cert/re-cert  Stiffness of right shoulder, not elsewhere classified - Plan: PT plan of care cert/re-cert     Problem  List Patient Active Problem List   Diagnosis Date Noted   Malignant neoplasm of upper-inner quadrant of right breast in female, estrogen receptor positive ([place]) 10/14/2020   Annia Friendly, PT 12/02/20 5:24 PM   [place], [place], [address] Phone: [phone]   Fax:  [phone]  Name: [name] MRN: [id number] Date of Birth: 01/30/1956

## 2020-12-02 NOTE — Patient Instructions (Addendum)
            [facility]         [address], [place] [address]         [phone]         Annia Friendly, PT, CLT   After Breast Cancer Class It is recommended you attend the ABC class to be educated on lymphedema risk reduction. This class is free of charge and lasts for 1 hour. It is a 1-time class.  You are scheduled for October 17th at 11:00. We will send you a link through your email.  Scar massage You are not ready for this ;)  Compression garment Ask Dr. Donne Hazel but it may be good for you to wear a compression bra.  Home exercise Program It's ok to be doing the exercises I gave you to improve your shoulder range of motion if Dr. Donne Hazel feels like it would be ok (ask him tomorrow).  Follow up PT: It is recommended you return every 3 months for the first 3 years following surgery to be assessed on the SOZO machine for an L-Dex score. This helps prevent clinically significant lymphedema in 95% of patients. These follow up screens are 10 minute appointments that you are not billed for. WE ARE SCHEDULED TO MOVE TO [place] December 23, 2020. APPOINTMENTS FOR SOZO SCREENS AFTER 12/23/2020 WILL BE LOCATED AT [facility] AT [address] [place] [address]. Please call us to confirm we have moved if your appointment is scheduled after October 3rd, 2022. The phone number is [phone]. You are scheduled for December 5th at 10:00.

## 2020-12-03 DIAGNOSIS — C50911 Malignant neoplasm of unspecified site of right female breast: Secondary | ICD-10-CM | POA: Diagnosis not present

## 2020-12-06 ENCOUNTER — Encounter: Payer: Self-pay | Admitting: *Deleted

## 2020-12-06 DIAGNOSIS — Z170 Estrogen receptor positive status [ER+]: Secondary | ICD-10-CM | POA: Diagnosis not present

## 2020-12-06 DIAGNOSIS — C50211 Malignant neoplasm of upper-inner quadrant of right female breast: Secondary | ICD-10-CM | POA: Diagnosis not present

## 2020-12-09 ENCOUNTER — Other Ambulatory Visit: Payer: Self-pay | Admitting: Oncology

## 2020-12-09 ENCOUNTER — Telehealth: Payer: Self-pay | Admitting: *Deleted

## 2020-12-09 ENCOUNTER — Encounter: Payer: Self-pay | Admitting: *Deleted

## 2020-12-09 NOTE — Telephone Encounter (Signed)
This RN spoke with pt per call stating " Dr [name] called me earlier and I guess I am kind of in shock and I forgot to ask some questions that would give me some peace of mind"  [name] states she did not think she would need chemotherapy " and what is it that now I do.. and what does that mean?"  This RN discussed initial biopsy and presentation indicated possible plan of care but that the tumor when removed and sent for studies showed aspects that are much predictive and showing benefit of chemotherapy to decrease recurrence.  This RN validated her feelings of shock- as well as though not what she was expecting - the test results can be beneficial in helping her make the best decisions.  She agreed with above- and understands at scheduled visit this Wednesday - Dr Jannifer Rodney would be going over results more in detail and what treatments would be beneficial.  Atlee verbalized understanding and appreciation of discussion.  No further questions at present.

## 2020-12-10 NOTE — Progress Notes (Signed)
[place]  [phone] Fax:([phone]) [phone]     ID: DELEAH TISON DOB: [birth year]  MR#: [id number]  KOE#:[id number]  Patient Care Team: Antony Contras, MD as PCP - General (Family Medicine) Mauro Kaufmann, RN as Oncology Nurse Navigator Rockwell Germany, RN as Oncology Nurse Navigator Rolm Bookbinder, MD as Consulting Physician (General Surgery) Andie Mungin, Virgie Dad, MD as Consulting Physician (Oncology) Eppie Gibson, MD as Attending Physician (Radiation Oncology) Arvella Nigh, MD as Consulting Physician (Obstetrics and Gynecology) Jacelyn Pi, MD as Referring Physician (Endocrinology) [name], MD OTHER MD:  CHIEF COMPLAINT: Estrogen receptor positive breast cancer  CURRENT TREATMENT: Adjuvant chemotherapy   INTERVAL HISTORY: Chantae returns today for follow up of her estrogen receptor positive breast cancer. She was evaluated in the multidisciplinary breast cancer clinic on 10/16/2020.  She is accompanied by her husband.  Since consultation, she underwent right lumpectomy on 11/05/2020 under Dr. Donne Hazel. Pathology from the procedure ([id number]) showed: invasive lobular carcinoma, grade 2, 0.8 cm; resection margins negative.  Both biopsied lymph nodes were negative for carcinoma (0/2).  Oncotype DX was obtained on the final surgical sample and the recurrence score of 32 predicts a risk of recurrence outside the breast over the next 9 years of 20% %, if the patient's only systemic therapy is an antiestrogen for 5 years.  It also predicts a significant benefit from chemotherapy.   She is here today to discuss chemotherapy options   REVIEW OF SYSTEMS: Tarynn did well with the surgery but then tells me she had a significant hematoma which had to be drained several times and did become infected.  She was treated with Augmentin.  It "tore up her stomach" but it does seem to have worked and she says the incision is almost completely healed now.   She says it was packed yesterday but the strip used to pack it came out.  She has an appointment with Dr. Donne Hazel later today.   COVID 19 VACCINATION STATUS: Pfizer x3 as of July 2022   HISTORY OF CURRENT ILLNESS: From the original intake note:  SHANIE MAUZY had routine screening mammography on 09/04/2020 showing a possible abnormality in the right breast. She underwent right diagnostic mammography with tomography and right breast ultrasonography at [facility] on 09/30/2020 showing: breast density category B; 0.7 cm mass in right breast at 12:30; normal right axillary lymph nodes.  Accordingly on 10/04/2020 she proceeded to biopsy of the right breast area in question. The pathology from this procedure ([id number]) showed: invasive mammary carcinoma, e-cadherin negative, grade 1; atypical lobular hyperplasia. Prognostic indicators significant for: estrogen receptor, 90% positive with moderate staining intensity and progesterone receptor, 0% negative. Proliferation marker Ki67 at 10%. HER2 negative by immunohistochemistry (1+).  Cancer Staging Malignant neoplasm of upper-inner quadrant of right breast in female, estrogen receptor positive ([place]) Staging form: Breast, AJCC 8th Edition - Clinical stage from 10/16/2020: Stage IA (cT1b, cN0, cM0, G1, ER+, PR-, HER2-) - Signed by [name], MD on 10/16/2020 Stage prefix: Initial diagnosis Histologic grading system: 3 grade system Laterality: Right Staged by: Pathologist and managing physician Stage used in treatment planning: Yes National guidelines used in treatment planning: Yes Type of national guideline used in treatment planning: NCCN  The patient's subsequent history is as detailed below.   PAST MEDICAL HISTORY: Past Medical History:  Diagnosis Date   Cataract    removed   Diabetes mellitus without complication ([place])    Hyperlipidemia    Hypertension  PONV (postoperative nausea and vomiting)     PAST SURGICAL  HISTORY: Past Surgical History:  Procedure Laterality Date   ABDOMINAL HYSTERECTOMY     BREAST LUMPECTOMY WITH RADIOACTIVE SEED AND SENTINEL LYMPH NODE BIOPSY Right 11/05/2020   Procedure: RIGHT BREAST LUMPECTOMY WITH RADIOACTIVE SEED AND RIGHT AXILLARY SENTINEL LYMPH NODE BIOPSY;  Surgeon: Rolm Bookbinder, MD;  Location: [facility];  Service: General;  Laterality: Right;   CHOLECYSTECTOMY     KNEE SURGERY      FAMILY HISTORY: Family History  Problem Relation Age of Onset   Heart attack Mother    Diabetes Father    Colon cancer Maternal Grandmother    Breast cancer Cousin        dx early 65's   Her father died at age 42 from diabetes complications. Her mother died at age 31 from MI. Daejah has two brothers (and no sisters). She reports breast cancer in a maternal cousin in her early 71's and colon cancer in a maternal grandmother.   GYNECOLOGIC HISTORY:  No LMP recorded. Patient has had a hysterectomy. Menarche: 65 years old Age at first live birth: 65 years old [place] P 3 LMP 02/2000 Contraceptive: used from 74 HRT never used  Hysterectomy? Yes, 02/2000 BSO? 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 Husband [name] is a retired Radiographer, therapeutic. Son [name], age 62, is a Geophysicist/field seismologist and youth pastor in [place]. Daughter [name], age 56, is a high school principal in [place]. Daughter [name], age 5, is a hair stylist here in [place]. Rayvin has two grandchildren. She attends a [employer].    ADVANCED DIRECTIVES: In the absence of any documentation to the contrary, the patient's spouse is their HCPOA.    HEALTH MAINTENANCE: Social History   Tobacco Use   Smoking status: Former    Packs/day: 0.50    Years: 10.00    Pack years: 5.00    Types: Cigarettes    Quit date: 09/25/1981    Years since quitting: 39.2   Substance Use Topics   Alcohol use: Never   Drug use: Never     Colonoscopy: 2018 (Dr. Earlean Shawl)  PAP: 08/2020  Bone density: 08/2020   No Known Allergies  Current Outpatient Medications  Medication Sig Dispense Refill   ACCU-CHEK GUIDE test strip      amitriptyline (ELAVIL) 25 MG tablet Take 25 mg by mouth at bedtime.     Blood Glucose Monitoring Suppl (ACCU-CHEK GUIDE ME) w/Device KIT      cetirizine (ZYRTEC) 10 MG tablet 1 tablet     Insulin Syringe-Needle U-100 (INSULIN SYRINGE .5CC/30GX5/16") 30G X 5/16" 0.5 ML MISC See admin instructions.     losartan (COZAAR) 25 MG tablet 1 tablet     metFORMIN (GLUCOPHAGE) 1000 MG tablet Take 1,000 mg by mouth 2 (two) times daily with a meal.     oxyCODONE (ROXICODONE) 5 MG immediate release tablet Take 1 tablet (5 mg total) by mouth every 4 (four) hours as needed for severe pain. 10 tablet 0   sertraline (ZOLOFT) 50 MG tablet sertraline 50 mg tablet  TAKE 1 TABLET BY MOUTH EVERY DAY     simvastatin (ZOCOR) 10 MG tablet Take 10 mg by mouth daily.     TOUJEO SOLOSTAR 300 UNIT/ML Solostar Pen      No current facility-administered medications for this  visit.    OBJECTIVE: White woman who appears stated age  65:   12/11/20 0809  BP: (!) 156/77  Pulse: 90  Resp: 18  Temp: (!) 97.2 F (36.2 C)  SpO2: 98%     Body mass index is 31.56 kg/m.   Wt Readings from Last 3 Encounters:  12/11/20 201 lb 8 oz (91.4 kg)  11/05/20 202 lb 13.2 oz (92 kg)  10/16/20 202 lb 11.2 oz (91.9 kg)      ECOG FS:1 - Symptomatic but completely ambulatory  Sclerae unicteric, EOMs intact Wearing a mask No cervical or supraclavicular adenopathy Lungs no rales or rhonchi Heart regular rate and rhythm Abd soft, obese nontender, positive bowel sounds MSK no focal spinal tenderness, no upper extremity lymphedema Neuro: nonfocal, well oriented, appropriate affect Breasts: The right breast is status post recent lumpectomy.  The incision is periareolar and  cosmetically very favorable.  There is currently no erythema or swelling and I cannot express any fluid on exam.  The left breast and both axillae are benign.   LAB RESULTS:  CMP     Component Value Date/Time   NA 140 10/16/2020 0833   K 4.1 10/16/2020 0833   CL 105 10/16/2020 0833   CO2 23 10/16/2020 0833   GLUCOSE 210 (H) 10/16/2020 0833   BUN 10 10/16/2020 0833   CREATININE 0.84 10/16/2020 0833   CALCIUM 9.5 10/16/2020 0833   PROT 7.3 10/16/2020 0833   ALBUMIN 3.8 10/16/2020 0833   AST 22 10/16/2020 0833   ALT 23 10/16/2020 0833   ALKPHOS 74 10/16/2020 0833   BILITOT 0.3 10/16/2020 0833   GFRNONAA >60 10/16/2020 0833    No results found for: TOTALPROTELP, ALBUMINELP, A1GS, A2GS, BETS, BETA2SER, GAMS, MSPIKE, SPEI  Lab Results  Component Value Date   WBC 7.7 10/16/2020   NEUTROABS 4.0 10/16/2020   HGB 13.2 10/16/2020   HCT 39.3 10/16/2020   MCV 89.9 10/16/2020   PLT 230 10/16/2020    No results found for: LABCA2  No components found for: FSELTR320  No results for input(s): INR in the last 168 hours.  No results found for: LABCA2  No results found for: EBX435  No results found for: WYS168  No results found for: HFG902  No results found for: CA2729  No components found for: HGQUANT  No results found for: CEA1 / No results found for: CEA1   No results found for: AFPTUMOR  No results found for: CHROMOGRNA  No results found for: KPAFRELGTCHN, LAMBDASER, KAPLAMBRATIO (kappa/lambda light chains)  No results found for: HGBA, HGBA2QUANT, HGBFQUANT, HGBSQUAN (Hemoglobinopathy evaluation)   No results found for: LDH  No results found for: IRON, TIBC, IRONPCTSAT (Iron and TIBC)  No results found for: FERRITIN  Urinalysis No results found for: COLORURINE, APPEARANCEUR, LABSPEC, PHURINE, GLUCOSEU, HGBUR, BILIRUBINUR, KETONESUR, PROTEINUR, UROBILINOGEN, NITRITE, LEUKOCYTESUR   STUDIES: No results found.   ELIGIBLE FOR AVAILABLE RESEARCH PROTOCOL:  no  ASSESSMENT: 65 y.o. [place] woman status post right breast upper inner quadrant biopsy 10/04/2020 for clinical T1b N0, stage IA invasive lobular carcinoma, E-cadherin negative, grade 1, estrogen receptor positive, progesterone receptor and HER2 negative, with an MIB-1 of 10%  (1) status post right lumpectomy and sentinel lymph node sampling 11/05/2020 for a PT1b pN0, stage IA invasive lobular carcinoma, grade 2  (2) Oncotype score of 32 predicts a risk of recurrence outside the breast within the next 9 years of 20% if the patient's only systemic therapy is antiestrogens for 5 years.  It also predicts significant benefit from chemotherapy.  (3) adjuvant chemotherapy with cyclophosphamide and docetaxel every 21 days x 4 to start 12/31/2020  (4) adjuvant radiation to follow  (5) antiestrogens to start at the completion of local treatment.   PLAN: I met today with Malasha and reviewed first of all her very favorable pathology.  She did have the hematoma complication but that seems to be almost completely resolved and she will be seeing her surgeon later today regarding that.  We then discussed the Oncotype.  She understands of the data for this study was derived from a database of women like her, she understands that the predicted 20% recurrence rate with a 10 years without chemotherapy indicate stage IV disease not local recurrence in the breast, and she understands that chemotherapy is expected to reduce the significantly, likely to something like a 5% range.  She is very agreeable to chemotherapy.  We discussed standard Adriamycin Cytoxan Taxol and the possibility of heart damage but that versus Cytoxan Taxotere and we are opting for the latter.  She understands that there may be permanent alopecia from the Taxotere and we specifically discussed the technique As an option.  She will get more information on that from our navigators.  There is also the possibility of permanent neuropathy and  we will be following that very closely and also during cryo prevention when she receives treatments.  We then reviewed all of the multiple other possible side effects of this combination which are not permanent and she will discuss that further with our chemotherapy teaching nurse most likely next week.  She has a beach trip planned for the first week in October.  She will return 12/31/2020 for her first cycle of chemotherapy and see me at that day to make sure she has no residual questions.  Hopefully she can get the port placed the day before.  Total encounter time today 55 minutes.   Virgie Dad. Kagan Mutchler, MD 12/11/2020 9:04 AM Medical Oncology and Hematology [facility] [place], [place] [address] Tel. [phone]    Fax. [phone]   This document serves as a record of services personally performed by Lurline Del, MD. It was created on his behalf by Wilburn Mylar, a trained medical scribe. The creation of this record is based on the scribe's personal observations and the provider's statements to them.   I, Lurline Del MD, have reviewed the above documentation for accuracy and completeness, and I agree with the above.   *Total Encounter Time as defined by the Centers for Medicare and Medicaid Services includes, in addition to the face-to-face time of a patient visit (documented in the note above) non-face-to-face time: obtaining and reviewing outside history, ordering and reviewing medications, tests or procedures, care coordination (communications with other health care professionals or caregivers) and documentation in the medical record.

## 2020-12-11 ENCOUNTER — Ambulatory Visit: Payer: Medicare HMO | Admitting: Rehabilitation

## 2020-12-11 ENCOUNTER — Other Ambulatory Visit: Payer: Self-pay | Admitting: Oncology

## 2020-12-11 ENCOUNTER — Inpatient Hospital Stay: Payer: Medicare HMO | Attending: Oncology | Admitting: Oncology

## 2020-12-11 ENCOUNTER — Other Ambulatory Visit: Payer: Self-pay

## 2020-12-11 ENCOUNTER — Encounter: Payer: Self-pay | Admitting: *Deleted

## 2020-12-11 VITALS — BP 156/77 | HR 90 | Temp 97.2°F | Resp 18 | Ht 67.0 in | Wt 201.5 lb

## 2020-12-11 DIAGNOSIS — C50211 Malignant neoplasm of upper-inner quadrant of right female breast: Secondary | ICD-10-CM | POA: Insufficient documentation

## 2020-12-11 DIAGNOSIS — Z170 Estrogen receptor positive status [ER+]: Secondary | ICD-10-CM | POA: Diagnosis not present

## 2020-12-11 MED ORDER — LIDOCAINE-PRILOCAINE 2.5-2.5 % EX CREA: TOPICAL_CREAM | CUTANEOUS | 3 refills | Status: DC

## 2020-12-11 MED ORDER — PROCHLORPERAZINE MALEATE 10 MG PO TABS: 10.0000 mg | ORAL_TABLET | Freq: Four times a day (QID) | ORAL | 1 refills | Status: DC | PRN

## 2020-12-11 MED ORDER — DEXAMETHASONE 4 MG PO TABS: 8.0000 mg | ORAL_TABLET | Freq: Two times a day (BID) | ORAL | 1 refills | Status: DC

## 2020-12-11 MED ORDER — LORAZEPAM 0.5 MG PO TABS: 0.5000 mg | ORAL_TABLET | Freq: Every evening | ORAL | 0 refills | Status: DC | PRN

## 2020-12-11 NOTE — Progress Notes (Signed)
START ON PATHWAY REGIMEN - Breast     A cycle is every 21 days:     Docetaxel      Cyclophosphamide   **Always confirm dose/schedule in your pharmacy ordering system**  Patient Characteristics: Postoperative without Neoadjuvant Therapy (Pathologic Staging), Invasive Disease, Adjuvant Therapy, HER2 Negative/Unknown/Equivocal, ER Positive, Node Negative, pT1a-c, pN0/N21m or pT2 or Higher, pN0, Oncotype High Risk (? 26) Therapeutic Status: Postoperative without Neoadjuvant Therapy (Pathologic Staging) AJCC Grade: G1 AJCC N Category: pN0 AJCC M Category: cM0 ER Status: Positive (+) AJCC 8 Stage Grouping: IA HER2 Status: Negative (-) Oncotype Dx Recurrence Score: 32 AJCC T Category: pT1b PR Status: Positive (+) Adjuvant Therapy Status: No Adjuvant Therapy Received Yet or Changing Initial Adjuvant Regimen due to Tolerance Has this patient completed genomic testing<= Yes - Oncotype DX(R) Intent of Therapy: Curative Intent, Discussed with Patient

## 2020-12-12 ENCOUNTER — Encounter (HOSPITAL_COMMUNITY): Payer: Self-pay

## 2020-12-13 ENCOUNTER — Encounter: Payer: Self-pay | Admitting: *Deleted

## 2020-12-16 ENCOUNTER — Ambulatory Visit: Payer: Medicare HMO

## 2020-12-16 ENCOUNTER — Ambulatory Visit: Payer: Medicare HMO | Admitting: Radiation Oncology

## 2020-12-17 ENCOUNTER — Encounter: Payer: Self-pay | Admitting: Rehabilitation

## 2020-12-17 ENCOUNTER — Other Ambulatory Visit: Payer: Self-pay

## 2020-12-17 ENCOUNTER — Ambulatory Visit: Payer: Medicare HMO | Admitting: Rehabilitation

## 2020-12-17 DIAGNOSIS — Z483 Aftercare following surgery for neoplasm: Secondary | ICD-10-CM | POA: Diagnosis not present

## 2020-12-17 DIAGNOSIS — Z170 Estrogen receptor positive status [ER+]: Secondary | ICD-10-CM | POA: Diagnosis not present

## 2020-12-17 DIAGNOSIS — C50211 Malignant neoplasm of upper-inner quadrant of right female breast: Secondary | ICD-10-CM

## 2020-12-17 DIAGNOSIS — R293 Abnormal posture: Secondary | ICD-10-CM

## 2020-12-17 DIAGNOSIS — M25611 Stiffness of right shoulder, not elsewhere classified: Secondary | ICD-10-CM | POA: Diagnosis not present

## 2020-12-17 NOTE — Therapy (Signed)
[place], [place], [address] Phone: [phone]   Fax:  [phone]  Physical Therapy Treatment  Patient Details  Name: [name] MRN: [id number] Date of Birth: October 04, 1955 Referring Provider (PT): Dr. Rolm Bookbinder   Encounter Date: 12/17/2020   PT End of Session - 12/17/20 1208     Visit Number 3    Number of Visits 10    PT Start Time 1000    PT Stop Time 1027    PT Time Calculation (min) 27 min    Activity Tolerance Patient tolerated treatment well    Behavior During Therapy [facility] for tasks assessed/performed             Past Medical History:  Diagnosis Date   Cataract    removed   Diabetes mellitus without complication ([place])    Hyperlipidemia    Hypertension    PONV (postoperative nausea and vomiting)     Past Surgical History:  Procedure Laterality Date   ABDOMINAL HYSTERECTOMY     BREAST LUMPECTOMY WITH RADIOACTIVE SEED AND SENTINEL LYMPH NODE BIOPSY Right 11/05/2020   Procedure: RIGHT BREAST LUMPECTOMY WITH RADIOACTIVE SEED AND RIGHT AXILLARY SENTINEL LYMPH NODE BIOPSY;  Surgeon: Rolm Bookbinder, MD;  Location: [facility];  Service: General;  Laterality: Right;   CHOLECYSTECTOMY     KNEE SURGERY      There were no vitals filed for this visit.   Subjective Assessment - 12/17/20 1000     Subjective The infection has improved and is gone now. Port placement will be placed 12/30/09.    Pertinent History Patient was diagnosed on 09/30/2020 with right grade I invasive lobular carcinoma breast cancer. She underwent a right lumpectomy and sentinel node biopsy (2 negative nodes) on 11/05/2020. It is ER positive, PR negative and HER2 negative with a Ki67 of 10%. Will be starting chemotherapy and then radiation.    Currently in Pain? No/denies                [facility] PT Assessment - 12/17/20 0001       AROM   Right Shoulder Extension 65 Degrees    Right  Shoulder Flexion 165 Degrees    Right Shoulder ABduction 167 Degrees    Right Shoulder External Rotation 90 Degrees                   Quick Dash - 12/17/20 0001     Open a tight or new jar Mild difficulty    Do heavy household chores (wash walls, wash floors) Mild difficulty    Carry a shopping bag or briefcase No difficulty    Wash your back No difficulty    Use a knife to cut food No difficulty    Recreational activities in which you take some force or impact through your arm, shoulder, or hand (golf, hammering, tennis) Mild difficulty    During the past week, to what extent has your arm, shoulder or hand problem interfered with your normal social activities with family, friends, neighbors, or groups? Not at all    During the past week, to what extent has your arm, shoulder or hand problem limited your work or other regular daily activities Not at all    Arm, shoulder, or hand pain. None    Tingling (pins and needles) in your arm, shoulder, or hand Mild    Difficulty Sleeping No difficulty    DASH Score 9.09 %  [facility] Adult PT Treatment/Exercise - 12/17/20 0001       Self-Care   Self-Care Other Self-Care Comments    Other Self-Care Comments  gave pt moving through cancer chemotherapy and radiation exercise handouts and suggestions with demonstration of each                          PT Long Term Goals - 12/17/20 1009       PT LONG TERM GOAL #1   Title Patient will demonstrate she has regained full shoulder ROM and function post operatively compared to baselines.    Status Achieved      PT LONG TERM GOAL #2   Title Patient will increase right shoulder active flexion to >/= 150 degrees for increased ease reaching overhead.    Status Achieved      PT LONG TERM GOAL #3   Title Patient will increase right shoulder active abduction to >/= 160 degrees for ability to obtain radiation positioning with ease.    Status Achieved       PT LONG TERM GOAL #4   Title Patient will verbalize good understanding of lymphedema risk reduction practices.    Status Achieved      PT LONG TERM GOAL #5   Title Patient will improve her DASH score to </= 8 for improved overall upper extremity function.    Baseline down to 9%    Status Partially Met                   Plan - 12/17/20 1240     Clinical Impression Statement Pt returns now with infection cleared, back to full baselines for shoulder AROM and no needs for PT.  Pt is scheduled for ABC class and next SOZO and was given walking and exercise information for during chemotherapy as pt is used to going back to the gym.             Patient will benefit from skilled therapeutic intervention in order to improve the following deficits and impairments:     Visit Diagnosis: Malignant neoplasm of upper-inner quadrant of right breast in female, estrogen receptor positive ([facility])  Abnormal posture  Aftercare following surgery for neoplasm  Stiffness of right shoulder, not elsewhere classified     Problem List Patient Active Problem List   Diagnosis Date Noted   Malignant neoplasm of upper-inner quadrant of right breast in female, estrogen receptor positive ([facility]) 10/14/2020    Tevis, Kara R, PT 12/17/2020, 12:44 PM  [facility] [address] [place], [place], [address] Phone: [phone]   Fax:  [phone]  Name: [name] MRN: [id number] Date of Birth: 02/15/1956    

## 2020-12-18 NOTE — Progress Notes (Signed)
Surgical Instructions    Your procedure is scheduled on Monday, October 10th, 2022.   Report to [facility] Main Entrance "A" at 10:30 A.M., then check in with the Admitting office.  Call this number if you have problems the morning of surgery:  [phone]   If you have any questions prior to your surgery date call [phone]: Open Monday-Friday 8am-4pm    Remember:  Do not eat after midnight the night before your surgery  You may drink clear liquids until 09:30 the morning of your surgery.   Clear liquids allowed are: Water, Non-Citrus Juices (without pulp), Carbonated Beverages, Clear Tea, Black Coffee ONLY (NO MILK, CREAM OR POWDERED CREAMER of any kind), and Gatorade    Take these medicines the morning of surgery with A SIP OF WATER:  dexamethasone (DECADRON)  sertraline (ZOLOFT)   If needed:   acetaminophen (TYLENOL) prochlorperazine (COMPAZINE)  As of today, STOP taking any Aspirin (unless otherwise instructed by your surgeon) Aleve, Naproxen, Ibuprofen, Motrin, Advil, Goody's, BC's, all herbal medications, fish oil, and all vitamins.   WHAT DO I DO ABOUT MY DIABETES MEDICATION?   Do not take metFORMIN (GLUCOPHAGE) the morning of surgery. If you are taking TOUJEO SOLOSTAR at night, the night before surgery take 50% of the dose (22 units). If you are taking it in the morning, the morning of surgery take TOUJEO SOLOSTAR 50 % of the dose (22 units).     HOW TO MANAGE YOUR DIABETES BEFORE AND AFTER SURGERY  Why is it important to control my blood sugar before and after surgery? Improving blood sugar levels before and after surgery helps healing and can limit problems. A way of improving blood sugar control is eating a healthy diet by:  Eating less sugar and carbohydrates  Increasing activity/exercise  Talking with your doctor about reaching your blood sugar goals High blood sugars (greater than 180 mg/dL) can raise your risk of infections and slow your recovery,  so you will need to focus on controlling your diabetes during the weeks before surgery. Make sure that the doctor who takes care of your diabetes knows about your planned surgery including the date and location.  How do I manage my blood sugar before surgery? Check your blood sugar at least 4 times a day, starting 2 days before surgery, to make sure that the level is not too high or low.  Check your blood sugar the morning of your surgery when you wake up and every 2 hours until you get to the Short Stay unit.  If your blood sugar is less than 70 mg/dL, you will need to treat for low blood sugar: Do not take insulin. Treat a low blood sugar (less than 70 mg/dL) with  cup of clear juice (cranberry or apple), 4 glucose tablets, OR glucose gel. Recheck blood sugar in 15 minutes after treatment (to make sure it is greater than 70 mg/dL). If your blood sugar is not greater than 70 mg/dL on recheck, call [phone] for further instructions. Report your blood sugar to the short stay nurse when you get to Short Stay.  If you are admitted to the hospital after surgery: Your blood sugar will be checked by the staff and you will probably be given insulin after surgery (instead of oral diabetes medicines) to make sure you have good blood sugar levels. The goal for blood sugar control after surgery is 80-180 mg/dL.            Do not wear jewelry or makeup  Do not wear lotions, powders, perfumes, or deodorant. Do not shave 48 hours prior to surgery.   Do not bring valuables to the hospital. DO Not wear nail polish, gel polish, artificial nails, or any other type of covering on natural nails including finger and toenails. If patients have artificial nails, gel coating, etc. that need to be removed by a nail salon please have this removed prior to surgery or surgery may need to be canceled/delayed if the surgeon/ anesthesia feels like the patient is unable to be adequately monitored.              [facility] is not responsible for any belongings or valuables.  Do NOT Smoke (Tobacco/Vaping)  24 hours prior to your procedure If you use a CPAP at night, you may bring your mask for your overnight stay.   Contacts, glasses, dentures or bridgework may not be worn into surgery, please bring cases for these belongings   For patients admitted to the hospital, discharge time will be determined by your treatment team.   Patients discharged the day of surgery will not be allowed to drive home, and someone needs to stay with them for 24 hours.  NO VISITORS WILL BE ALLOWED IN PRE-OP WHERE PATIENTS GET READY FOR SURGERY.  ONLY 1 SUPPORT PERSON MAY BE PRESENT IN THE WAITING ROOM WHILE YOU ARE IN SURGERY.  IF YOU ARE TO BE ADMITTED, ONCE YOU ARE IN YOUR ROOM YOU WILL BE ALLOWED TWO (2) VISITORS.  Minor children may have two parents present. Special consideration for safety and communication needs will be reviewed on a case by case basis.  Special instructions:    Oral Hygiene is also important to reduce your risk of infection.  Remember - BRUSH YOUR TEETH THE MORNING OF SURGERY WITH YOUR REGULAR TOOTHPASTE   [place]- Preparing For Surgery  Before surgery, you can play an important role. Because skin is not sterile, your skin needs to be as free of germs as possible. You can reduce the number of germs on your skin by washing with CHG (chlorahexidine gluconate) Soap before surgery.  CHG is an antiseptic cleaner which kills germs and bonds with the skin to continue killing germs even after washing.     Please do not use if you have an allergy to CHG or antibacterial soaps. If your skin becomes reddened/irritated stop using the CHG.  Do not shave (including legs and underarms) for at least 48 hours prior to first CHG shower. It is OK to shave your face.  Please follow these instructions carefully.     Shower the NIGHT BEFORE SURGERY and the MORNING OF SURGERY with CHG Soap.   If you chose to wash  your hair, wash your hair first as usual with your normal shampoo. After you shampoo, rinse your hair and body thoroughly to remove the shampoo.  Then [employer] and genitals (private parts) with your normal soap and rinse thoroughly to remove soap.  After that Use CHG Soap as you would any other liquid soap. You can apply CHG directly to the skin and wash gently with a scrungie or a clean washcloth.   Apply the CHG Soap to your body ONLY FROM THE NECK DOWN.  Do not use on open wounds or open sores. Avoid contact with your eyes, ears, mouth and genitals (private parts). Wash Face and genitals (private parts)  with your normal soap.   Wash thoroughly, paying special attention to the area where your surgery will  be performed.  Thoroughly rinse your body with warm water from the neck down.  DO NOT shower/wash with your normal soap after using and rinsing off the CHG Soap.  Pat yourself dry with a CLEAN TOWEL.  Wear CLEAN PAJAMAS to bed the night before surgery  Place CLEAN SHEETS on your bed the night before your surgery  DO NOT SLEEP WITH PETS.   Day of Surgery:  Take a shower with CHG soap. Wear Clean/Comfortable clothing the morning of surgery Do not apply any deodorants/lotions.   Remember to brush your teeth WITH YOUR REGULAR TOOTHPASTE.   Please read over the following fact sheets that you were given.

## 2020-12-19 ENCOUNTER — Other Ambulatory Visit: Payer: Self-pay | Admitting: General Surgery

## 2020-12-19 ENCOUNTER — Encounter: Payer: Medicare HMO | Admitting: Rehabilitation

## 2020-12-19 ENCOUNTER — Other Ambulatory Visit: Payer: Self-pay

## 2020-12-19 ENCOUNTER — Encounter (HOSPITAL_COMMUNITY)
Admission: RE | Admit: 2020-12-19 | Discharge: 2020-12-19 | Disposition: A | Discharge status: Home / Self Care | Payer: Medicare HMO | Source: Ambulatory Visit | Attending: General Surgery | Admitting: General Surgery

## 2020-12-19 ENCOUNTER — Encounter (HOSPITAL_COMMUNITY): Payer: Self-pay

## 2020-12-19 DIAGNOSIS — Z7984 Long term (current) use of oral hypoglycemic drugs: Secondary | ICD-10-CM | POA: Insufficient documentation

## 2020-12-19 DIAGNOSIS — I10 Essential (primary) hypertension: Secondary | ICD-10-CM | POA: Insufficient documentation

## 2020-12-19 DIAGNOSIS — Z7901 Long term (current) use of anticoagulants: Secondary | ICD-10-CM | POA: Insufficient documentation

## 2020-12-19 DIAGNOSIS — Z79899 Other long term (current) drug therapy: Secondary | ICD-10-CM | POA: Diagnosis not present

## 2020-12-19 DIAGNOSIS — C50911 Malignant neoplasm of unspecified site of right female breast: Secondary | ICD-10-CM | POA: Insufficient documentation

## 2020-12-19 DIAGNOSIS — Z794 Long term (current) use of insulin: Secondary | ICD-10-CM | POA: Insufficient documentation

## 2020-12-19 DIAGNOSIS — Z170 Estrogen receptor positive status [ER+]: Secondary | ICD-10-CM | POA: Insufficient documentation

## 2020-12-19 DIAGNOSIS — Z01812 Encounter for preprocedural laboratory examination: Secondary | ICD-10-CM | POA: Diagnosis not present

## 2020-12-19 DIAGNOSIS — E119 Type 2 diabetes mellitus without complications: Secondary | ICD-10-CM | POA: Insufficient documentation

## 2020-12-19 HISTORY — DX: Personal history of urinary calculi: Z87.442

## 2020-12-19 HISTORY — DX: Malignant (primary) neoplasm, unspecified: C80.1

## 2020-12-19 LAB — CBC
HCT: 38.1 % (ref 36.0–46.0)
Hemoglobin: 11.9 g/dL — ABNORMAL LOW (ref 12.0–15.0)
MCH: 28.9 pg (ref 26.0–34.0)
MCHC: 31.2 g/dL (ref 30.0–36.0)
MCV: 92.5 fL (ref 80.0–100.0)
Platelets: 267 10*3/uL (ref 150–400)
RBC: 4.12 MIL/uL (ref 3.87–5.11)
RDW: 13.2 % (ref 11.5–15.5)
WBC: 7.5 10*3/uL (ref 4.0–10.5)
nRBC: 0 % (ref 0.0–0.2)

## 2020-12-19 LAB — HEMOGLOBIN A1C
Hgb A1c MFr Bld: 8.7 % — ABNORMAL HIGH (ref 4.8–5.6)
Mean Plasma Glucose: 202.99 mg/dL

## 2020-12-19 LAB — BASIC METABOLIC PANEL
Anion gap: 11 (ref 5–15)
BUN: 9 mg/dL (ref 8–23)
CO2: 26 mmol/L (ref 22–32)
Calcium: 9.4 mg/dL (ref 8.9–10.3)
Chloride: 101 mmol/L (ref 98–111)
Creatinine, Ser: 0.7 mg/dL (ref 0.44–1.00)
GFR, Estimated: >60 mL/min (ref >60)
Glucose, Bld: 268 mg/dL — ABNORMAL HIGH (ref 70–99)
Potassium: 4.2 mmol/L (ref 3.5–5.1)
Sodium: 138 mmol/L (ref 135–145)

## 2020-12-19 LAB — GLUCOSE, CAPILLARY: Glucose-Capillary: 247 mg/dL — ABNORMAL HIGH (ref 70–99)

## 2020-12-19 NOTE — Progress Notes (Addendum)
PCP - Dr. Antony Contras Cardiologist - Denies Endorinologist: Dr. Jacelyn Pi Oncologist: Dr. Lurline Del  PPM/ICD - Denies  Chest x-ray - N/A EKG - 10/30/20 Stress Test - Denies ECHO - Denies Cardiac Cath - Denies  Sleep Study - Denies  Fasting Blood Sugar - 120-170 Checks Blood Sugar _1-2x_ times a day  Blood Thinner Instructions: N/A Aspirin Instructions: N/A  ERAS Protcol - Yes, G2  COVID TEST- N/A   Anesthesia review: Yes, A1C > 8.0%  Patient denies shortness of breath, fever, cough and chest pain at PAT appointment   All instructions explained to the patient, with a verbal understanding of the material. Patient agrees to go over the instructions while at home for a better understanding. Patient also instructed to self quarantine after being tested for COVID-19. The opportunity to ask questions was provided.

## 2020-12-19 NOTE — Progress Notes (Addendum)
Surgical Instructions    Your procedure is scheduled on Monday, October 10th, 2022 at 12:30 PM.   Report to [facility] Main Entrance "A" at 10:30 A.M., then check in with the Admitting office.  Call this number if you have problems the morning of surgery:  [phone]   If you have any questions prior to your surgery date call [phone]: Open Monday-Friday 8am-4pm    Remember:  Do not eat after midnight the night before your surgery  You may drink clear liquids until 9:30 AM the morning of your surgery.   Clear liquids allowed are: Water, Non-Citrus Juices (without pulp), Carbonated Beverages, Clear Tea, Black Coffee ONLY (NO MILK, CREAM OR POWDERED CREAMER of any kind), and Gatorade  Please complete your G2 that was provided to you by 9:30 AM the morning of surgery.  Please, if able, drink it in one setting. DO NOT SIP.     Take these medicines the morning of surgery with A SIP OF WATER:  sertraline (ZOLOFT)   If needed:   acetaminophen (TYLENOL) prochlorperazine (COMPAZINE) dexamethasone (DECADRON)   As of today, STOP taking any Aspirin (unless otherwise instructed by your surgeon) Aleve, Naproxen, Ibuprofen, Motrin, Advil, Goody's, BC's, all herbal medications, fish oil, and all vitamins.   WHAT DO I DO ABOUT MY DIABETES MEDICATION?   Do not take metFORMIN (GLUCOPHAGE) the morning of surgery.  If you are taking TOUJEO SOLOSTAR at night, the night before surgery take 50% of the dose (22 units). If you are taking it in the morning, the morning of surgery take TOUJEO SOLOSTAR 50 % of the dose (22 units).     HOW TO MANAGE YOUR DIABETES BEFORE AND AFTER SURGERY  Why is it important to control my blood sugar before and after surgery? Improving blood sugar levels before and after surgery helps healing and can limit problems. A way of improving blood sugar control is eating a healthy diet by:  Eating less sugar and carbohydrates  Increasing activity/exercise   Talking with your doctor about reaching your blood sugar goals High blood sugars (greater than 180 mg/dL) can raise your risk of infections and slow your recovery, so you will need to focus on controlling your diabetes during the weeks before surgery. Make sure that the doctor who takes care of your diabetes knows about your planned surgery including the date and location.  How do I manage my blood sugar before surgery? Check your blood sugar at least 4 times a day, starting 2 days before surgery, to make sure that the level is not too high or low.  Check your blood sugar the morning of your surgery when you wake up and every 2 hours until you get to the Short Stay unit.  If your blood sugar is less than 70 mg/dL, you will need to treat for low blood sugar: Do not take insulin. Treat a low blood sugar (less than 70 mg/dL) with  cup of clear juice (cranberry or apple), 4 glucose tablets, OR glucose gel. Recheck blood sugar in 15 minutes after treatment (to make sure it is greater than 70 mg/dL). If your blood sugar is not greater than 70 mg/dL on recheck, call [phone] for further instructions. Report your blood sugar to the short stay nurse when you get to Short Stay.  If you are admitted to the hospital after surgery: Your blood sugar will be checked by the staff and you will probably be given insulin after surgery (instead of oral diabetes medicines) to make  sure you have good blood sugar levels. The goal for blood sugar control after surgery is 80-180 mg/dL.            Do not wear jewelry or makeup Do not wear lotions, powders, perfumes, or deodorant. Do not shave 48 hours prior to surgery.   Do not bring valuables to the hospital. DO Not wear nail polish, gel polish, artificial nails, or any other type of covering on natural nails including finger and toenails. If patients have artificial nails, gel coating, etc. that need to be removed by a nail salon please have this removed prior  to surgery or surgery may need to be canceled/delayed if the surgeon/ anesthesia feels like the patient is unable to be adequately monitored.              [place] is not responsible for any belongings or valuables.  Do NOT Smoke (Tobacco/Vaping)  24 hours prior to your procedure If you use a CPAP at night, you may bring your mask for your overnight stay.   Contacts, glasses, dentures or bridgework may not be worn into surgery, please bring cases for these belongings   For patients admitted to the hospital, discharge time will be determined by your treatment team.   Patients discharged the day of surgery will not be allowed to drive home, and someone needs to stay with them for 24 hours.  NO VISITORS WILL BE ALLOWED IN PRE-OP WHERE PATIENTS GET READY FOR SURGERY.  ONLY 1 SUPPORT PERSON MAY BE PRESENT IN THE WAITING ROOM WHILE YOU ARE IN SURGERY.  IF YOU ARE TO BE ADMITTED, ONCE YOU ARE IN YOUR ROOM YOU WILL BE ALLOWED TWO (2) VISITORS.  Minor children may have two parents present. Special consideration for safety and communication needs will be reviewed on a case by case basis.  Special instructions:    Oral Hygiene is also important to reduce your risk of infection.  Remember - BRUSH YOUR TEETH THE MORNING OF SURGERY WITH YOUR REGULAR TOOTHPASTE   [place]- Preparing For Surgery  Before surgery, you can play an important role. Because skin is not sterile, your skin needs to be as free of germs as possible. You can reduce the number of germs on your skin by washing with CHG (chlorahexidine gluconate) Soap before surgery.  CHG is an antiseptic cleaner which kills germs and bonds with the skin to continue killing germs even after washing.     Please do not use if you have an allergy to CHG or antibacterial soaps. If your skin becomes reddened/irritated stop using the CHG.  Do not shave (including legs and underarms) for at least 48 hours prior to first CHG shower. It is OK to shave  your face.  Please follow these instructions carefully.     Shower the NIGHT BEFORE SURGERY and the MORNING OF SURGERY with CHG Soap.   If you chose to wash your hair, wash your hair first as usual with your normal shampoo. After you shampoo, rinse your hair and body thoroughly to remove the shampoo.  Then [employer] and genitals (private parts) with your normal soap and rinse thoroughly to remove soap.  After that Use CHG Soap as you would any other liquid soap. You can apply CHG directly to the skin and wash gently with a scrungie or a clean washcloth.   Apply the CHG Soap to your body ONLY FROM THE NECK DOWN.  Do not use on open wounds or open sores. Avoid  contact with your eyes, ears, mouth and genitals (private parts). Wash Face and genitals (private parts)  with your normal soap.   Wash thoroughly, paying special attention to the area where your surgery will be performed.  Thoroughly rinse your body with warm water from the neck down.  DO NOT shower/wash with your normal soap after using and rinsing off the CHG Soap.  Pat yourself dry with a CLEAN TOWEL.  Wear CLEAN PAJAMAS to bed the night before surgery  Place CLEAN SHEETS on your bed the night before your surgery  DO NOT SLEEP WITH PETS.   Day of Surgery:  Take a shower with CHG soap. Wear Clean/Comfortable clothing the morning of surgery Do not apply any deodorants/lotions.   Remember to brush your teeth WITH YOUR REGULAR TOOTHPASTE.   Please read over the following fact sheets that you were given.

## 2020-12-23 ENCOUNTER — Inpatient Hospital Stay: Payer: Medicare HMO

## 2020-12-23 ENCOUNTER — Other Ambulatory Visit: Payer: Self-pay

## 2020-12-23 ENCOUNTER — Inpatient Hospital Stay: Payer: Medicare HMO | Attending: Oncology | Admitting: Adult Health

## 2020-12-23 VITALS — BP 158/77 | HR 88 | Temp 97.7°F | Resp 18 | Ht 67.0 in | Wt 207.3 lb

## 2020-12-23 DIAGNOSIS — C50211 Malignant neoplasm of upper-inner quadrant of right female breast: Secondary | ICD-10-CM | POA: Insufficient documentation

## 2020-12-23 DIAGNOSIS — Z5111 Encounter for antineoplastic chemotherapy: Secondary | ICD-10-CM | POA: Diagnosis not present

## 2020-12-23 DIAGNOSIS — Z170 Estrogen receptor positive status [ER+]: Secondary | ICD-10-CM | POA: Diagnosis not present

## 2020-12-23 DIAGNOSIS — Z5189 Encounter for other specified aftercare: Secondary | ICD-10-CM | POA: Diagnosis not present

## 2020-12-23 DIAGNOSIS — Z23 Encounter for immunization: Secondary | ICD-10-CM | POA: Insufficient documentation

## 2020-12-23 MED ORDER — INFLUENZA VAC A&B SA ADJ QUAD 0.5 ML IM PRSY
0.5000 mL | PREFILLED_SYRINGE | Freq: Once | INTRAMUSCULAR | Status: AC
  Administered 2020-12-23: 0.5 mL via INTRAMUSCULAR

## 2020-12-23 MED ORDER — INFLUENZA VAC A&B SA ADJ QUAD 0.5 ML IM PRSY
PREFILLED_SYRINGE | INTRAMUSCULAR | Status: AC
  Filled 2020-12-23: qty 0.5

## 2020-12-23 NOTE — Progress Notes (Signed)
Flu shot gave by MD desk nurse

## 2020-12-23 NOTE — Progress Notes (Signed)
[place]  [phone] [phone]     ID: [name] DOB: Mar 18, 1956  MR#: [id number]  [name]:[id number]  Patient Care Team: Antony Contras, MD as PCP - General (Family Medicine) Mauro Kaufmann, RN as Oncology Nurse Navigator Rockwell Germany, RN as Oncology Nurse Navigator Rolm Bookbinder, MD as Consulting Physician (General Surgery) Magrinat, Virgie Dad, MD as Consulting Physician (Oncology) Eppie Gibson, MD as Attending Physician (Radiation Oncology) Arvella Nigh, MD as Consulting Physician (Obstetrics and Gynecology) Jacelyn Pi, MD as Referring Physician (Endocrinology) Scot Dock, NP OTHER MD:  CHIEF COMPLAINT: Estrogen receptor positive breast cancer  CURRENT TREATMENT: Adjuvant chemotherapy   INTERVAL HISTORY: Dann returns today for follow up of her estrogen receptor positive breast cancer. She is accompanied by her husband.  She is anxious about upciming chemotherapy next week, however feels ready to proceed with therapy.  She notes that her breast is healing well.  She has received her anti nausea medications, and she will undergo port placement on Monday, 12/30/2020.  Other than feeling anxious, she is without concerns.  REVIEW OF SYSTEMS: Review of Systems  Constitutional:  Negative for appetite change, chills, fatigue, fever and unexpected weight change.  HENT:   Negative for hearing loss, lump/mass and trouble swallowing.   Eyes:  Negative for eye problems and icterus.  Respiratory:  Negative for chest tightness, cough and shortness of breath.   Cardiovascular:  Negative for chest pain, leg swelling and palpitations.  Gastrointestinal:  Negative for abdominal distention, abdominal pain, constipation, diarrhea, nausea and vomiting.  Endocrine: Negative for hot flashes.  Genitourinary:  Negative for difficulty urinating.   Musculoskeletal:  Negative for arthralgias.  Skin:  Negative for itching and rash.  Neurological:   Negative for dizziness, extremity weakness, headaches and numbness.  Hematological:  Negative for adenopathy. Does not bruise/bleed easily.  Psychiatric/Behavioral:  Negative for depression. The patient is nervous/anxious.      COVID 19 VACCINATION STATUS: Pfizer x3 as of July 2022   HISTORY OF CURRENT ILLNESS: From the original intake note:  [name] had routine screening mammography on 09/04/2020 showing a possible abnormality in the right breast. She underwent right diagnostic mammography with tomography and right breast ultrasonography at [facility] on 09/30/2020 showing: breast density category B; 0.7 cm mass in right breast at 12:30; normal right axillary lymph nodes.  Accordingly on 10/04/2020 she proceeded to biopsy of the right breast area in question. The pathology from this procedure ([id number]) showed: invasive mammary carcinoma, e-cadherin negative, grade 1; atypical lobular hyperplasia. Prognostic indicators significant for: estrogen receptor, 90% positive with moderate staining intensity and progesterone receptor, 0% negative. Proliferation marker Ki67 at 10%. HER2 negative by immunohistochemistry (1+).  Cancer Staging Malignant neoplasm of upper-inner quadrant of right breast in female, estrogen receptor positive ([place]) Staging form: Breast, AJCC 8th Edition - Clinical stage from 10/16/2020: Stage IA (cT1b, cN0, cM0, G1, ER+, PR-, HER2-) - Signed by [name], MD on 10/16/2020 Stage prefix: Initial diagnosis Histologic grading system: 3 grade system Laterality: Right Staged by: Pathologist and managing physician Stage used in treatment planning: Yes National guidelines used in treatment planning: Yes Type of national guideline used in treatment planning: NCCN  The patient's subsequent history is as detailed below.   PAST MEDICAL HISTORY: Past Medical History:  Diagnosis Date   Cancer [facility])    Cataract    removed   Diabetes mellitus without  complication ([place])    History of kidney stones  Hyperlipidemia    Hypertension    PONV (postoperative nausea and vomiting)     PAST SURGICAL HISTORY: Past Surgical History:  Procedure Laterality Date   ABDOMINAL HYSTERECTOMY     BREAST LUMPECTOMY WITH RADIOACTIVE SEED AND SENTINEL LYMPH NODE BIOPSY Right 11/05/2020   Procedure: RIGHT BREAST LUMPECTOMY WITH RADIOACTIVE SEED AND RIGHT AXILLARY SENTINEL LYMPH NODE BIOPSY;  Surgeon: Rolm Bookbinder, MD;  Location: [facility];  Service: General;  Laterality: Right;   CHOLECYSTECTOMY     KNEE SURGERY      FAMILY HISTORY: Family History  Problem Relation Age of Onset   Heart attack Mother    Diabetes Father    Colon cancer Maternal Grandmother    Breast cancer Cousin        dx early 69's   Her father died at age 76 from diabetes complications. Her mother died at age 73 from MI. [name] has two brothers (and no sisters). She reports breast cancer in a maternal cousin in her early 56's and colon cancer in a maternal grandmother.   GYNECOLOGIC HISTORY:  No LMP recorded. Patient has had a hysterectomy. Menarche: 65 years old Age at first live birth: 65 years old [place] P 3 LMP 02/2000 Contraceptive: used from 7 HRT never used  Hysterectomy? Yes, 02/2000 BSO? 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 Husband [name] is a retired Radiographer, therapeutic. Son [name], age 83, is a Geophysicist/field seismologist and youth pastor in [place]. Daughter [name], age 66, is a high school principal in [place]. Daughter [name], age 69, is a hair stylist here in [place]. Carl has two grandchildren. She attends a [employer].    ADVANCED DIRECTIVES: In the absence of any documentation to the contrary, the patient's spouse is their HCPOA.    HEALTH MAINTENANCE: Social History   Tobacco Use   Smoking  status: Former    Packs/day: 0.50    Years: 10.00    Pack years: 5.00    Types: Cigarettes    Quit date: 09/25/1981    Years since quitting: 39.2   Smokeless tobacco: Never  Vaping Use   Vaping Use: Never used  Substance Use Topics   Alcohol use: Never   Drug use: Never     Colonoscopy: 2018 (Dr. Earlean Shawl)  PAP: 08/2020  Bone density: 08/2020   Allergies  Allergen Reactions   Glimepiride     Hypoglycemia     Current Outpatient Medications  Medication Sig Dispense Refill   ACCU-CHEK GUIDE test strip      acetaminophen (TYLENOL) 500 MG tablet Take 1,000 mg by mouth every 6 (six) hours as needed for moderate pain.     amitriptyline (ELAVIL) 25 MG tablet Take 25 mg by mouth at bedtime.     Blood Glucose Monitoring Suppl (ACCU-CHEK GUIDE ME) w/Device KIT      cetirizine (ZYRTEC) 10 MG tablet Take 10 mg by mouth at bedtime.     Cholecalciferol (DIALYVITE VITAMIN D 5000) 125 MCG (5000 UT) capsule Take 5,000 Units by mouth daily.     dexamethasone (DECADRON) 4 MG tablet Take 2 tablets (8 mg total) by mouth 2 (two) times daily. Start the day before Taxotere. Then again the day after chemo for 3 days. 30 tablet 1   ibuprofen (ADVIL) 200 MG tablet Take 400 mg by mouth every 6 (six) hours as needed  for moderate pain or headache.     Insulin Syringe-Needle U-100 (INSULIN SYRINGE .5CC/30GX5/16") 30G X 5/16" 0.5 ML MISC See admin instructions.     LORazepam (ATIVAN) 0.5 MG tablet Take 1 tablet (0.5 mg total) by mouth at bedtime as needed (Nausea or vomiting). 30 tablet 0   losartan (COZAAR) 25 MG tablet Take 25 mg by mouth daily.     metFORMIN (GLUCOPHAGE) 500 MG tablet Take 1,000 mg by mouth 2 (two) times daily.     Multiple Vitamin (MULTIVITAMIN WITH MINERALS) TABS tablet Take 1 tablet by mouth daily.     Probiotic Product (PROBIOTIC PO) Take 1 capsule by mouth daily.     prochlorperazine (COMPAZINE) 10 MG tablet Take 1 tablet (10 mg total) by mouth every 6 (six) hours as needed (Nausea or  vomiting). 30 tablet 1   sertraline (ZOLOFT) 50 MG tablet Take 50 mg by mouth daily.     simvastatin (ZOCOR) 40 MG tablet Take 40 mg by mouth at bedtime.     TOUJEO SOLOSTAR 300 UNIT/ML Solostar Pen Inject 45 Units into the skin daily.     lidocaine-prilocaine (EMLA) cream Apply to affected area once (Patient not taking: Reported on 12/23/2020) 30 g 3   No current facility-administered medications for this visit.    OBJECTIVE:   Vitals:   12/23/20 0853  BP: (!) 158/77  Pulse: 88  Resp: 18  Temp: 97.7 F (36.5 C)  SpO2: 98%      Body mass index is 32.47 kg/m.   Wt Readings from Last 3 Encounters:  12/23/20 207 lb 4.8 oz (94 kg)  12/19/20 206 lb 14.4 oz (93.8 kg)  12/11/20 201 lb 8 oz (91.4 kg)      ECOG FS:1 - Symptomatic but completely ambulatory GENERAL: Patient is a well appearing female in no acute distress HEENT:  Sclerae anicteric.  Oropharynx clear and moist. No ulcerations or evidence of oropharyngeal candidiasis. Neck is supple.  NODES:  No cervical, supraclavicular, or axillary lymphadenopathy palpated.  BREAST EXAM:  right breast s/p lumpectomy, no sign of local recurrence, left breast benign LUNGS:  Clear to auscultation bilaterally.  No wheezes or rhonchi. HEART:  Regular rate and rhythm. No murmur appreciated. ABDOMEN:  Soft, nontender.  Positive, normoactive bowel sounds. No organomegaly palpated. MSK:  No focal spinal tenderness to palpation. Full range of motion bilaterally in the upper extremities. EXTREMITIES:  No peripheral edema.   SKIN:  Clear with no obvious rashes or skin changes. No nail dyscrasia. NEURO:  Nonfocal. Well oriented.  Appropriate affect.    LAB RESULTS:  CMP     Component Value Date/Time   NA 138 12/19/2020 1453   K 4.2 12/19/2020 1453   CL 101 12/19/2020 1453   CO2 26 12/19/2020 1453   GLUCOSE 268 (H) 12/19/2020 1453   BUN 9 12/19/2020 1453   CREATININE 0.70 12/19/2020 1453   CREATININE 0.84 10/16/2020 0833   CALCIUM 9.4  12/19/2020 1453   PROT 7.3 10/16/2020 0833   ALBUMIN 3.8 10/16/2020 0833   AST 22 10/16/2020 0833   ALT 23 10/16/2020 0833   ALKPHOS 74 10/16/2020 0833   BILITOT 0.3 10/16/2020 0833   GFRNONAA >60 12/19/2020 1453   GFRNONAA >60 10/16/2020 0833    No results found for: TOTALPROTELP, ALBUMINELP, A1GS, A2GS, BETS, BETA2SER, GAMS, MSPIKE, SPEI  Lab Results  Component Value Date   WBC 7.5 12/19/2020   NEUTROABS 4.0 10/16/2020   HGB 11.9 (L) 12/19/2020   HCT 38.1 12/19/2020  MCV 92.5 12/19/2020   PLT 267 12/19/2020    No results found for: LABCA2  No components found for: QMVHQI696  No results for input(s): INR in the last 168 hours.  No results found for: LABCA2  No results found for: EXB284  No results found for: XLK440  No results found for: NUU725  No results found for: CA2729  No components found for: HGQUANT  No results found for: CEA1 / No results found for: CEA1   No results found for: AFPTUMOR  No results found for: CHROMOGRNA  No results found for: KPAFRELGTCHN, LAMBDASER, KAPLAMBRATIO (kappa/lambda light chains)  No results found for: HGBA, HGBA2QUANT, HGBFQUANT, HGBSQUAN (Hemoglobinopathy evaluation)   No results found for: LDH  No results found for: IRON, TIBC, IRONPCTSAT (Iron and TIBC)  No results found for: FERRITIN  Urinalysis No results found for: COLORURINE, APPEARANCEUR, LABSPEC, PHURINE, GLUCOSEU, HGBUR, BILIRUBINUR, KETONESUR, PROTEINUR, UROBILINOGEN, NITRITE, LEUKOCYTESUR   STUDIES: No results found.   ELIGIBLE FOR AVAILABLE RESEARCH PROTOCOL: no  ASSESSMENT: 65 y.o. [place] woman status post right breast upper inner quadrant biopsy 10/04/2020 for clinical T1b N0, stage IA invasive lobular carcinoma, E-cadherin negative, grade 1, estrogen receptor positive, progesterone receptor and HER2 negative, with an MIB-1 of 10%  (1) status post right lumpectomy and sentinel lymph node sampling 11/05/2020 for a PT1b pN0, stage IA  invasive lobular carcinoma, grade 2  (2) Oncotype score of 32 predicts a risk of recurrence outside the breast within the next 9 years of 20% if the patient's only systemic therapy is antiestrogens for 5 years.  It also predicts significant benefit from chemotherapy.  (3) adjuvant chemotherapy with cyclophosphamide and docetaxel every 21 days x 4 to start 12/31/2020  (4) adjuvant radiation to follow  (5) antiestrogens to start at the completion of local treatment.   PLAN: Ruchel is here for f/u today to discuss her upcoming chemotherapy regimen with Docetaxel and Cyclophsopahmide.  I reviewed with her that due to her oncotype score, receiving chemotherapy can decrease her risk of recurrence.  She understands this.  I reviewed her anti nausea medications, the premeds she will get prior to treatment, her two chemotherapy infusions, and the udenyca.  She understands that our treatment goal is to cure her cancer.    I reassured Khamiya that a lot of improvements have been made in symptom management of patients during their chemotherapy administration.  I reviewed how our nausea medications work, and she will discuss her anti-nausea map with our chemo education nurse later today.    We reviewed the possibility of irreversible hair loss.  She is considering using the Cold caps to help reduce the hair loss.  She is going to get more information about this from our chemo education nurse.    She and I discussed the Udenyca and how it works, and that she will receive it 2 days after her treatment.  She understands this.    Parrie will return in 1 week for labs, f/u with Dr. [name], and her first chemotherapy with Docetaxel and Cyclophosphamide.  I requested we get her scheduled for lab and f/u one week after her treatment.  She knows to call for any questions that may arise between now and her next appointment.  We are happy to see her sooner if needed.   Total encounter time: 55 minutes in face to face  visit time, chart review, lab review, order entry, care coordination, and documentation of the encounter.     Wilber Bihari, NP  12/23/20 7:11 PM Medical Oncology and Hematology [facility] [place], [place] [address] Tel. [phone]    Fax. [phone]    *Total Encounter Time as defined by the Centers for Medicare and Medicaid Services includes, in addition to the face-to-face time of a patient visit (documented in the note above) non-face-to-face time: obtaining and reviewing outside history, ordering and reviewing medications, tests or procedures, care coordination (communications with other health care professionals or caregivers) and documentation in the medical record.

## 2020-12-23 NOTE — Progress Notes (Signed)
Case: [id number] Date/Time: 12/30/20 1215   Procedure: INSERTION PORT-A-CATH (Breast)   Anesthesia type: General   Pre-op diagnosis: BREAST CANCER   Location: [facility] OR ROOM 02 / [facility]   Surgeons: Rolm Bookbinder, MD      Patient is a 65 yo female scheduled for the above procedure.   DISCUSSION:  Her PMHx consists of newly diagnosed R breast cancer (estrogen receptor +) s/p R lumpectomy and sentinel lymph node sampling (11/05/20) for a PT1b pN0, stage IA invasive lobular carcinoma, grade 2. She also has a history of DM 2, HTN and diverticulitis.   Underwent right lumpectomy on 11/05/2020 under Dr. Donne Hazel. Pathology from the procedure ([id number]) showed: invasive lobular carcinoma, grade 2, 0.8 cm; resection margins negative.Both biopsied lymph nodes were negative for carcinoma (0/2). Of note she had a significant hematoma s/p lumpectomy which had to be drained several times and was treated with Augmentin.   Seen 12/11/20 by Dr. [name] to discuss treatment and based off her final surgical sample and recurrence score it predicts a significant benefit from chemotherapy. At [facility] discussed chemotherapy with "cyclophosphamide and docetaxel every 21 days x 4 to start 12/31/2020, adjuvant radiation to follow and antiestrogens to start at the completion of local treatment.   VS: BP (!) 158/78   Pulse 92   Temp 36.8 C (Oral)   Resp 18   Ht _0  (1.702 m)   Wt 93.8 kg   SpO2 100%   BMI 32.41 kg/m   Vitals with BMI 12/23/2020 12/19/2020 12/11/2020  Height _1  _2  _3   Weight 207 lbs 5 oz 206 lbs 14 oz 201 lbs 8 oz  BMI 32.46 35.3 29.92  Systolic 426 834 196  Diastolic 77 78 77  Pulse 88 92 90    PROVIDERS: Antony Contras, MD as PCP - General (Family Medicine) Rolm Bookbinder, MD as Consulting Physician (General Surgery) Magrinat, Virgie Dad, MD as Consulting Physician (Oncology) Eppie Gibson, MD as Attending Physician (Radiation Oncology) Arvella Nigh, MD as Consulting Physician  (Obstetrics and Gynecology) Jacelyn Pi, MD as Referring Physician (Endocrinology) Scot Dock, NP   LABS: Labs reviewed: Acceptable for surgery. (all labs ordered are listed, but only abnormal results are displayed)  Labs Reviewed  GLUCOSE, CAPILLARY - Abnormal; Notable for the following components:      Result Value   Glucose-Capillary 247 (*)    All other components within normal limits  HEMOGLOBIN A1C - Abnormal; Notable for the following components:   Hgb A1c MFr Bld 8.7 (*)    All other components within normal limits  BASIC METABOLIC PANEL - Abnormal; Notable for the following components:   Glucose, Bld 268 (*)    All other components within normal limits  CBC - Abnormal; Notable for the following components:   Hemoglobin 11.9 (*)    All other components within normal limits   Hgb A1C: 8.7 (12/19/20)  IMAGES:   EKG: 10/30/20: NSR, minimal voltage criteria for LVH, 83 bpm   Past Medical History:  Diagnosis Date   Cancer ([facility])    Cataract    removed   Diabetes mellitus without complication ([place])    History of kidney stones    Hyperlipidemia    Hypertension    PONV (postoperative nausea and vomiting)     Past Surgical History:  Procedure Laterality Date   ABDOMINAL HYSTERECTOMY     BREAST LUMPECTOMY WITH RADIOACTIVE SEED AND SENTINEL LYMPH NODE BIOPSY Right 11/05/2020   Procedure: RIGHT BREAST LUMPECTOMY WITH RADIOACTIVE  SEED AND RIGHT AXILLARY SENTINEL LYMPH NODE BIOPSY;  Surgeon: Rolm Bookbinder, MD;  Location: [facility];  Service: General;  Laterality: Right;   CHOLECYSTECTOMY     KNEE SURGERY      MEDICATIONS:  ACCU-CHEK GUIDE test strip   acetaminophen (TYLENOL) 500 MG tablet   amitriptyline (ELAVIL) 25 MG tablet   Blood Glucose Monitoring Suppl (ACCU-CHEK GUIDE ME) w/Device KIT   cetirizine (ZYRTEC) 10 MG tablet   Cholecalciferol (DIALYVITE VITAMIN D 5000) 125 MCG (5000 UT) capsule   dexamethasone (DECADRON) 4 MG tablet    ibuprofen (ADVIL) 200 MG tablet   Insulin Syringe-Needle U-100 (INSULIN SYRINGE .5CC/30GX5/16") 30G X 5/16" 0.5 ML MISC   lidocaine-prilocaine (EMLA) cream   LORazepam (ATIVAN) 0.5 MG tablet   losartan (COZAAR) 25 MG tablet   metFORMIN (GLUCOPHAGE) 500 MG tablet   Multiple Vitamin (MULTIVITAMIN WITH MINERALS) TABS tablet   Probiotic Product (PROBIOTIC PO)   prochlorperazine (COMPAZINE) 10 MG tablet   sertraline (ZOLOFT) 50 MG tablet   simvastatin (ZOCOR) 40 MG tablet   TOUJEO SOLOSTAR 300 UNIT/ML Solostar Pen   No current facility-administered medications for this encounter.    influenza vaccine adjuvanted (FLUAD) 0.5 ML injection   Of note blood sugars have been increased based off last few visits and her recent Hgb A1C is 8.6. Her fasting blood glucoses have been 120-170.   Junie Bame, DNP, CRNA, NP-C Short Stay/Anesthesia

## 2020-12-23 NOTE — Anesthesia Preprocedure Evaluation (Addendum)
Anesthesia Evaluation  Patient identified by MRN, date of birth, ID band Patient awake    Reviewed: Allergy & Precautions, NPO status , Patient's Chart, lab work & pertinent test results  History of Anesthesia Complications (+) PONV and history of anesthetic complications  Airway Mallampati: III  TM Distance: >3 FB Neck ROM: Full    Dental  (+) Dental Advisory Given, Teeth Intact   Pulmonary neg shortness of breath, neg sleep apnea, neg COPD, neg recent URI, former smoker,    breath sounds clear to auscultation       Cardiovascular hypertension, (-) angina(-) Past MI and (-) CHF  Rhythm:Regular     Neuro/Psych negative neurological ROS  negative psych ROS   GI/Hepatic negative GI ROS, Neg liver ROS,   Endo/Other  diabetes, Insulin Dependent  Renal/GU      Musculoskeletal negative musculoskeletal ROS (+)   Abdominal   Peds  Hematology  (+) Blood dyscrasia, anemia , Lab Results      Component                Value               Date                      WBC                      7.5                 12/19/2020                HGB                      11.9 (L)            12/19/2020                HCT                      38.1                12/19/2020                MCV                      92.5                12/19/2020                PLT                      267                 12/19/2020              Anesthesia Other Findings   Reproductive/Obstetrics                            Anesthesia Physical Anesthesia Plan  ASA: 2  Anesthesia Plan: General   Post-op Pain Management:    Induction: Intravenous  PONV Risk Score and Plan: 4 or greater and Ondansetron and Dexamethasone  Airway Management Planned: LMA  Additional Equipment: None  Intra-op Plan:   Post-operative Plan: Extubation in OR  Informed Consent: I have reviewed the patients History and Physical, chart, labs and  discussed the procedure including the risks, benefits and alternatives  for the proposed anesthesia with the patient or authorized representative who has indicated his/her understanding and acceptance.     Dental advisory given  Plan Discussed with: CRNA and Anesthesiologist  Anesthesia Plan Comments: (Please see PAT note from 12/23/20 by me.   Junie Bame, DNP, CRNA, NP-C Short Stay/Anesthesia )       Anesthesia Quick Evaluation

## 2020-12-24 NOTE — Progress Notes (Signed)
Pharmacist Chemotherapy Monitoring - Initial Assessment    Anticipated start date: 12/31/20   The following has been reviewed per standard work regarding the patient's treatment regimen: The patient's diagnosis, treatment plan and drug doses, and organ/hematologic function Lab orders and baseline tests specific to treatment regimen  The treatment plan start date, drug sequencing, and pre-medications Prior authorization status  Patient's documented medication list, including drug-drug interaction screen and prescriptions for anti-emetics and supportive care specific to the treatment regimen The drug concentrations, fluid compatibility, administration routes, and timing of the medications to be used The patient's access for treatment and lifetime cumulative dose history, if applicable  The patient's medication allergies and previous infusion related reactions, if applicable   Changes made to treatment plan:  N/A  Follow up needed:  N/A   Larene Beach, RPH, 12/24/2020  3:14 PM

## 2020-12-25 ENCOUNTER — Encounter: Payer: Self-pay | Admitting: *Deleted

## 2020-12-30 ENCOUNTER — Other Ambulatory Visit: Payer: Self-pay | Admitting: *Deleted

## 2020-12-30 ENCOUNTER — Encounter: Payer: Self-pay | Admitting: Oncology

## 2020-12-30 ENCOUNTER — Ambulatory Visit (HOSPITAL_COMMUNITY)
Admission: RE | Admit: 2020-12-30 | Discharge: 2020-12-30 | Disposition: A | Discharge status: Home / Self Care | Payer: Medicare HMO | Attending: General Surgery | Admitting: General Surgery

## 2020-12-30 ENCOUNTER — Other Ambulatory Visit: Payer: Self-pay

## 2020-12-30 ENCOUNTER — Ambulatory Visit (HOSPITAL_COMMUNITY): Payer: Medicare HMO | Admitting: Anesthesiology

## 2020-12-30 ENCOUNTER — Encounter (HOSPITAL_COMMUNITY): Payer: Self-pay | Admitting: General Surgery

## 2020-12-30 ENCOUNTER — Encounter (HOSPITAL_COMMUNITY)
Admission: RE | Disposition: A | Discharge status: Home / Self Care | Payer: Self-pay | Source: Home / Self Care | Attending: General Surgery

## 2020-12-30 ENCOUNTER — Ambulatory Visit (HOSPITAL_COMMUNITY): Payer: Medicare HMO

## 2020-12-30 DIAGNOSIS — Z79899 Other long term (current) drug therapy: Secondary | ICD-10-CM | POA: Insufficient documentation

## 2020-12-30 DIAGNOSIS — Z452 Encounter for adjustment and management of vascular access device: Secondary | ICD-10-CM | POA: Diagnosis not present

## 2020-12-30 DIAGNOSIS — I10 Essential (primary) hypertension: Secondary | ICD-10-CM | POA: Diagnosis not present

## 2020-12-30 DIAGNOSIS — C50911 Malignant neoplasm of unspecified site of right female breast: Secondary | ICD-10-CM | POA: Diagnosis not present

## 2020-12-30 DIAGNOSIS — Z803 Family history of malignant neoplasm of breast: Secondary | ICD-10-CM | POA: Insufficient documentation

## 2020-12-30 DIAGNOSIS — Z419 Encounter for procedure for purposes other than remedying health state, unspecified: Secondary | ICD-10-CM

## 2020-12-30 DIAGNOSIS — C50211 Malignant neoplasm of upper-inner quadrant of right female breast: Secondary | ICD-10-CM | POA: Diagnosis not present

## 2020-12-30 DIAGNOSIS — Z170 Estrogen receptor positive status [ER+]: Secondary | ICD-10-CM

## 2020-12-30 DIAGNOSIS — Z7985 Long-term (current) use of injectable non-insulin antidiabetic drugs: Secondary | ICD-10-CM | POA: Insufficient documentation

## 2020-12-30 DIAGNOSIS — Z888 Allergy status to other drugs, medicaments and biological substances status: Secondary | ICD-10-CM | POA: Insufficient documentation

## 2020-12-30 DIAGNOSIS — Z7984 Long term (current) use of oral hypoglycemic drugs: Secondary | ICD-10-CM | POA: Diagnosis not present

## 2020-12-30 DIAGNOSIS — Z87891 Personal history of nicotine dependence: Secondary | ICD-10-CM | POA: Diagnosis not present

## 2020-12-30 DIAGNOSIS — E785 Hyperlipidemia, unspecified: Secondary | ICD-10-CM | POA: Diagnosis not present

## 2020-12-30 HISTORY — PX: PORTACATH PLACEMENT: SHX2246

## 2020-12-30 LAB — GLUCOSE, CAPILLARY
Glucose-Capillary: 118 mg/dL — ABNORMAL HIGH (ref 70–99)
Glucose-Capillary: 93 mg/dL (ref 70–99)

## 2020-12-30 SURGERY — INSERTION, TUNNELED CENTRAL VENOUS DEVICE, WITH PORT
Anesthesia: General | Site: Chest | Laterality: Right

## 2020-12-30 MED ORDER — PHENYLEPHRINE 40 MCG/ML (10ML) SYRINGE FOR IV PUSH (FOR BLOOD PRESSURE SUPPORT)
PREFILLED_SYRINGE | INTRAVENOUS | Status: AC
  Filled 2020-12-30: qty 10

## 2020-12-30 MED ORDER — PHENYLEPHRINE HCL (PRESSORS) 10 MG/ML IV SOLN
INTRAVENOUS | Status: DC | PRN
  Administered 2020-12-30 (×2): 50 ug via INTRAVENOUS
  Administered 2020-12-30: 100 ug via INTRAVENOUS

## 2020-12-30 MED ORDER — SODIUM CHLORIDE 0.9% FLUSH: 3.0000 mL | INTRAVENOUS | Status: DC | PRN

## 2020-12-30 MED ORDER — CHLORHEXIDINE GLUCONATE 0.12 % MT SOLN
15.0000 mL | Freq: Once | OROMUCOSAL | Status: AC
  Administered 2020-12-30: 15 mL via OROMUCOSAL
  Filled 2020-12-30: qty 15

## 2020-12-30 MED ORDER — ACETAMINOPHEN 10 MG/ML IV SOLN: 1000.0000 mg | Freq: Once | INTRAVENOUS | Status: DC | PRN

## 2020-12-30 MED ORDER — ACETAMINOPHEN 500 MG PO TABS: 1000.0000 mg | ORAL_TABLET | Freq: Once | ORAL | Status: DC | PRN

## 2020-12-30 MED ORDER — HEPARIN 6000 UNIT IRRIGATION SOLUTION
Status: AC
  Filled 2020-12-30: qty 500

## 2020-12-30 MED ORDER — PROPOFOL 10 MG/ML IV BOLUS
INTRAVENOUS | Status: DC | PRN
  Administered 2020-12-30: 180 mg via INTRAVENOUS

## 2020-12-30 MED ORDER — ACETAMINOPHEN 500 MG PO TABS
1000.0000 mg | ORAL_TABLET | ORAL | Status: AC
  Administered 2020-12-30: 1000 mg via ORAL
  Filled 2020-12-30: qty 2

## 2020-12-30 MED ORDER — 0.9 % SODIUM CHLORIDE (POUR BTL) OPTIME
TOPICAL | Status: DC | PRN
  Administered 2020-12-30: 1000 mL

## 2020-12-30 MED ORDER — PROPOFOL 500 MG/50ML IV EMUL
INTRAVENOUS | Status: DC | PRN
  Administered 2020-12-30: 125 ug/kg/min via INTRAVENOUS

## 2020-12-30 MED ORDER — FENTANYL CITRATE (PF) 100 MCG/2ML IJ SOLN: 25.0000 ug | INTRAMUSCULAR | Status: DC | PRN

## 2020-12-30 MED ORDER — PROPOFOL 10 MG/ML IV BOLUS
INTRAVENOUS | Status: AC
  Filled 2020-12-30: qty 20

## 2020-12-30 MED ORDER — LACTATED RINGERS IV SOLN: INTRAVENOUS | Status: DC

## 2020-12-30 MED ORDER — ORAL CARE MOUTH RINSE
15.0000 mL | Freq: Once | OROMUCOSAL | Status: AC
Start: 2020-12-30 — End: 2020-12-30

## 2020-12-30 MED ORDER — MIDAZOLAM HCL 5 MG/5ML IJ SOLN
INTRAMUSCULAR | Status: DC | PRN
  Administered 2020-12-30: 2 mg via INTRAVENOUS

## 2020-12-30 MED ORDER — HEPARIN 6000 UNIT IRRIGATION SOLUTION
Status: DC | PRN
  Administered 2020-12-30: 1

## 2020-12-30 MED ORDER — BUPIVACAINE HCL 0.25 % IJ SOLN
INTRAMUSCULAR | Status: DC | PRN
  Administered 2020-12-30: 5 mL

## 2020-12-30 MED ORDER — HEPARIN SOD (PORK) LOCK FLUSH 100 UNIT/ML IV SOLN
INTRAVENOUS | Status: DC | PRN
  Administered 2020-12-30: 400 [IU]

## 2020-12-30 MED ORDER — ONDANSETRON HCL 4 MG/2ML IJ SOLN
INTRAMUSCULAR | Status: AC
  Filled 2020-12-30: qty 2

## 2020-12-30 MED ORDER — SODIUM CHLORIDE 0.9% FLUSH: 3.0000 mL | Freq: Two times a day (BID) | INTRAVENOUS | Status: DC

## 2020-12-30 MED ORDER — CEFAZOLIN SODIUM-DEXTROSE 2-4 GM/100ML-% IV SOLN
2.0000 g | INTRAVENOUS | Status: AC
  Administered 2020-12-30: 2 g via INTRAVENOUS
  Filled 2020-12-30: qty 100

## 2020-12-30 MED ORDER — OXYCODONE HCL 5 MG PO TABS
ORAL_TABLET | ORAL | Status: AC
  Filled 2020-12-30: qty 1

## 2020-12-30 MED ORDER — BUPIVACAINE HCL (PF) 0.25 % IJ SOLN
INTRAMUSCULAR | Status: AC
  Filled 2020-12-30: qty 30

## 2020-12-30 MED ORDER — ONDANSETRON HCL 4 MG/2ML IJ SOLN
INTRAMUSCULAR | Status: DC | PRN
  Administered 2020-12-30: 4 mg via INTRAVENOUS

## 2020-12-30 MED ORDER — DEXAMETHASONE SODIUM PHOSPHATE 10 MG/ML IJ SOLN
INTRAMUSCULAR | Status: AC
  Filled 2020-12-30: qty 1

## 2020-12-30 MED ORDER — CHLORHEXIDINE GLUCONATE CLOTH 2 % EX PADS: 6.0000 | MEDICATED_PAD | Freq: Once | CUTANEOUS | Status: DC

## 2020-12-30 MED ORDER — OXYCODONE HCL 5 MG/5ML PO SOLN: 5.0000 mg | Freq: Once | ORAL | Status: DC | PRN

## 2020-12-30 MED ORDER — FENTANYL CITRATE (PF) 250 MCG/5ML IJ SOLN
INTRAMUSCULAR | Status: AC
  Filled 2020-12-30: qty 5

## 2020-12-30 MED ORDER — ENSURE PRE-SURGERY PO LIQD: 296.0000 mL | Freq: Once | ORAL | Status: DC

## 2020-12-30 MED ORDER — ACETAMINOPHEN 160 MG/5ML PO SOLN: 1000.0000 mg | Freq: Once | ORAL | Status: DC | PRN

## 2020-12-30 MED ORDER — ACETAMINOPHEN 325 MG PO TABS: 650.0000 mg | ORAL_TABLET | ORAL | Status: DC | PRN

## 2020-12-30 MED ORDER — MIDAZOLAM HCL 2 MG/2ML IJ SOLN
INTRAMUSCULAR | Status: AC
  Filled 2020-12-30: qty 2

## 2020-12-30 MED ORDER — GLYCOPYRROLATE PF 0.2 MG/ML IJ SOSY
PREFILLED_SYRINGE | INTRAMUSCULAR | Status: AC
  Filled 2020-12-30: qty 1

## 2020-12-30 MED ORDER — FENTANYL CITRATE (PF) 250 MCG/5ML IJ SOLN
INTRAMUSCULAR | Status: DC | PRN
  Administered 2020-12-30: 50 ug via INTRAVENOUS

## 2020-12-30 MED ORDER — ACETAMINOPHEN 650 MG RE SUPP: 650.0000 mg | RECTAL | Status: DC | PRN

## 2020-12-30 MED ORDER — SODIUM CHLORIDE 0.9 % IV SOLN: 250.0000 mL | INTRAVENOUS | Status: DC | PRN

## 2020-12-30 MED ORDER — OXYCODONE HCL 5 MG PO TABS: 5.0000 mg | ORAL_TABLET | Freq: Once | ORAL | Status: DC | PRN

## 2020-12-30 MED ORDER — SODIUM CHLORIDE 0.9 % IV SOLN: INTRAVENOUS | Status: DC

## 2020-12-30 MED ORDER — OXYCODONE HCL 5 MG PO TABS: 5.0000 mg | ORAL_TABLET | ORAL | Status: DC | PRN

## 2020-12-30 MED ORDER — HEPARIN SOD (PORK) LOCK FLUSH 100 UNIT/ML IV SOLN
INTRAVENOUS | Status: AC
  Filled 2020-12-30: qty 5

## 2020-12-30 MED FILL — Dexamethasone Sodium Phosphate Inj 100 MG/10ML: INTRAMUSCULAR | Qty: 1 | Status: AC

## 2020-12-30 SURGICAL SUPPLY — 45 items
ADH SKN CLS APL DERMABOND .7 (GAUZE/BANDAGES/DRESSINGS) ×1
BAG COUNTER SPONGE SURGICOUNT (BAG) ×2 IMPLANT
BAG DECANTER FOR FLEXI CONT (MISCELLANEOUS) ×2 IMPLANT
BAG SPNG CNTER NS LX DISP (BAG) ×1
CHLORAPREP W/TINT 10.5 ML (MISCELLANEOUS) ×2 IMPLANT
COVER SURGICAL LIGHT HANDLE (MISCELLANEOUS) ×2 IMPLANT
COVER TRANSDUCER ULTRASND GEL (DISPOSABLE) ×2 IMPLANT
DECANTER SPIKE VIAL GLASS SM (MISCELLANEOUS) ×2 IMPLANT
DERMABOND ADVANCED (GAUZE/BANDAGES/DRESSINGS) ×1
DERMABOND ADVANCED .7 DNX12 (GAUZE/BANDAGES/DRESSINGS) ×1 IMPLANT
DRAPE C-ARM 42X120 X-RAY (DRAPES) ×2 IMPLANT
DRAPE CHEST BREAST 15X10 FENES (DRAPES) ×2 IMPLANT
DRSG TEGADERM 4X4.75 (GAUZE/BANDAGES/DRESSINGS) ×2 IMPLANT
ELECT CAUTERY BLADE 6.4 (BLADE) ×2 IMPLANT
ELECT REM PT RETURN 9FT ADLT (ELECTROSURGICAL) ×2
ELECTRODE REM PT RTRN 9FT ADLT (ELECTROSURGICAL) ×1 IMPLANT
GAUZE 4X4 16PLY ~~LOC~~+RFID DBL (SPONGE) ×2 IMPLANT
GAUZE SPONGE 4X4 12PLY STRL (GAUZE/BANDAGES/DRESSINGS) ×1 IMPLANT
GEL ULTRASOUND 20GR AQUASONIC (MISCELLANEOUS) ×2 IMPLANT
GLOVE SURG ENC MOIS LTX SZ7 (GLOVE) ×2 IMPLANT
GLOVE SURG UNDER POLY LF SZ7.5 (GLOVE) ×2 IMPLANT
GOWN STRL REUS W/ TWL LRG LVL3 (GOWN DISPOSABLE) ×2 IMPLANT
GOWN STRL REUS W/TWL LRG LVL3 (GOWN DISPOSABLE) ×4
INTRODUCER COOK 11FR (CATHETERS) IMPLANT
KIT BASIN OR (CUSTOM PROCEDURE TRAY) ×2 IMPLANT
KIT PORT POWER 8FR ISP CVUE (Port) ×2 IMPLANT
KIT TURNOVER KIT B (KITS) ×2 IMPLANT
NS IRRIG 1000ML POUR BTL (IV SOLUTION) ×2 IMPLANT
PAD ARMBOARD 7.5X6 YLW CONV (MISCELLANEOUS) ×4 IMPLANT
PENCIL BUTTON HOLSTER BLD 10FT (ELECTRODE) ×2 IMPLANT
POSITIONER HEAD DONUT 9IN (MISCELLANEOUS) ×2 IMPLANT
SET INTRODUCER 12FR PACEMAKER (INTRODUCER) IMPLANT
SET SHEATH INTRODUCER 10FR (MISCELLANEOUS) IMPLANT
SHEATH COOK PEEL AWAY SET 9F (SHEATH) IMPLANT
STRIP CLOSURE SKIN 1/2X4 (GAUZE/BANDAGES/DRESSINGS) ×1 IMPLANT
SUT MNCRL AB 4-0 PS2 18 (SUTURE) ×2 IMPLANT
SUT PROLENE 2 0 SH DA (SUTURE) ×2 IMPLANT
SUT SILK 2 0 (SUTURE)
SUT SILK 2-0 18XBRD TIE 12 (SUTURE) IMPLANT
SUT VIC AB 3-0 SH 27 (SUTURE) ×2
SUT VIC AB 3-0 SH 27XBRD (SUTURE) ×1 IMPLANT
SYR 5ML LUER SLIP (SYRINGE) ×2 IMPLANT
TOWEL GREEN STERILE (TOWEL DISPOSABLE) ×2 IMPLANT
TOWEL GREEN STERILE FF (TOWEL DISPOSABLE) ×2 IMPLANT
TRAY LAPAROSCOPIC MC (CUSTOM PROCEDURE TRAY) ×2 IMPLANT

## 2020-12-30 NOTE — Interval H&P Note (Signed)
History and Physical Interval Note:  12/30/2020 11:48 AM  [name]  has presented today for surgery, with the diagnosis of BREAST CANCER.  The various methods of treatment have been discussed with the patient and family. After consideration of risks, benefits and other options for treatment, the patient has consented to  Procedure(s): INSERTION PORT-A-CATH (N/A) as a surgical intervention.  The patient's history has been reviewed, patient examined, no change in status, stable for surgery.  I have reviewed the patient's chart and labs.  Questions were answered to the patient's satisfaction.     Rolm Bookbinder

## 2020-12-30 NOTE — Transfer of Care (Signed)
Immediate Anesthesia Transfer of Care Note  Patient: [name]  Procedure(s) Performed: INSERTION PORT-A-CATH (Right: Chest)  Patient Location: PACU  Anesthesia Type:General  Level of Consciousness: drowsy  Airway & Oxygen Therapy: Patient Spontanous Breathing and Patient connected to face mask oxygen  Post-op Assessment: Report given to RN and Post -op Vital signs reviewed and stable  Post vital signs: Reviewed and stable  Last Vitals:  Vitals Value Taken Time  BP    Temp    Pulse 86 12/30/20 1317  Resp 12 12/30/20 1317  SpO2 99 % 12/30/20 1317  Vitals shown include unvalidated device data.  Last Pain:  Vitals:   12/30/20 1058  TempSrc:   PainSc: 0-No pain         Complications: No notable events documented.

## 2020-12-30 NOTE — Discharge Instructions (Signed)
PORT-A-CATH: POST OP INSTRUCTIONS  Always review your discharge instruction sheet given to you by the facility where your surgery was performed.   A prescription for pain medication may be given to you upon discharge. Take your pain medication as prescribed, if needed. If narcotic pain medicine is not needed, then you make take acetaminophen (Tylenol) or ibuprofen (Advil) as needed.  Take your usually prescribed medications unless otherwise directed. If you need a refill on your pain medication, please contact our office. All narcotic pain medicine now requires a paper prescription.  Phoned in and fax refills are no longer allowed by law.  Prescriptions will not be filled after 5 pm or on weekends.  You should follow a light diet for the remainder of the day after your procedure. Most patients will experience some mild swelling and/or bruising in the area of the incision. It may take several days to resolve. It is common to experience some constipation if taking pain medication after surgery. Increasing fluid intake and taking a stool softener (such as Colace) will usually help or prevent this problem from occurring. A mild laxative (Milk of Magnesia or Miralax) should be taken according to package directions if there are no bowel movements after 48 hours.  Unless discharge instructions indicate otherwise, you may remove your bandages 48 hours after surgery, and you may shower at that time. You may have steri-strips (small white skin tapes) in place directly over the incision.  These strips should be left on the skin for 7-10 days.  If your surgeon used Dermabond (skin glue) on the incision, you may shower in 24 hours.  The glue will flake off over the next 2-3 weeks.  If your port is left accessed at the end of surgery (needle left in port), the dressing cannot get wet and should only by changed by a healthcare professional. When the port is no longer accessed (when the needle has been removed), follow  step 7.   ACTIVITIES:  Limit activity involving your arms for the next 72 hours. Do no strenuous exercise or activity for 1 week. You may drive when you are no longer taking prescription pain medication, you can comfortably wear a seatbelt, and you can maneuver your car. 10.You may need to see your doctor in the office for a follow-up appointment.  Please       check with your doctor.  11.When you receive a new Port-a-Cath, you will get a product guide and        ID card.  Please keep them in case you need them.  WHEN TO CALL YOUR DOCTOR ([phone]): Fever over 101.0 Chills Continued bleeding from incision Increased redness and tenderness at the site Shortness of breath, difficulty breathing   The clinic staff is available to answer your questions during regular business hours. Please don't hesitate to call and ask to speak to one of the nurses or medical assistants for clinical concerns. If you have a medical emergency, go to the nearest emergency room or call 911.  A surgeon from [facility] is always on call at the hospital.     For further information, please visit [email or web address]      

## 2020-12-30 NOTE — Anesthesia Procedure Notes (Signed)
Procedure Name: LMA Insertion Date/Time: 12/30/2020 12:22 PM Performed by: Vonna Drafts, CRNA Pre-anesthesia Checklist: Patient identified, Emergency Drugs available, Suction available, Patient being monitored and Timeout performed Patient Re-evaluated:Patient Re-evaluated prior to induction Oxygen Delivery Method: Circle system utilized Preoxygenation: Pre-oxygenation with 100% oxygen Induction Type: IV induction Ventilation: Mask ventilation without difficulty LMA: LMA inserted LMA Size: 3.0 Number of attempts: 1 Dental Injury: Teeth and Oropharynx as per pre-operative assessment

## 2020-12-30 NOTE — Progress Notes (Signed)
[name]  [phone] [phone]     ID: [name] DOB: [birth year]  MR#: [id number]  BHA#:[id number]  Patient Care Team: Kimberly Contras, MD as PCP - General (Family Medicine) Kimberly Kaufmann, RN as Oncology Nurse Navigator Kimberly Germany, RN as Oncology Nurse Navigator Kimberly Bookbinder, MD as Consulting Physician (General Surgery) Magrinat, Virgie Dad, MD as Consulting Physician (Oncology) Kimberly Gibson, MD as Attending Physician (Radiation Oncology) Kimberly Nigh, MD as Consulting Physician (Obstetrics and Gynecology) Kimberly Pi, MD as Referring Physician (Endocrinology) Kimberly Cruel, MD OTHER MD:  CHIEF COMPLAINT: Estrogen receptor positive breast cancer  CURRENT TREATMENT: Adjuvant chemotherapy   INTERVAL HISTORY: [name] returns today for follow up and treatment of her estrogen receptor positive breast cancer. She is accompanied by her husband.   She is scheduled to begin docetaxel and cyclophosphamide today. This will be given every 21 days x4.  She had her port placed yesterday by [name].  She experienced a little soreness around the neck but not a big deal as she says and she took some Tylenol for that.  She feels very ready to start treatment   REVIEW OF SYSTEMS: [name] has looked at her schedule very carefully and noted some discrepancies.  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  She did take Decadron this morning which she was not supposed to do but that is not a major issue.  She was able to sleep okay last night after taking dexamethasone the day before and lorazepam did help.  She tried to get the dignity kappa we were not able to get her insurance to approve it.  She tells me she is planning to get her haircut by her daughter who is a hairstylist in a few days and she requested a prescription for a week.   COVID 19 VACCINATION STATUS: Pfizer x3 as of July  2022   HISTORY OF CURRENT ILLNESS: From the original intake note:  [name] had routine screening mammography on 09/04/2020 showing a possible abnormality in the right breast. She underwent right diagnostic mammography with tomography and right breast ultrasonography at [facility] on 09/30/2020 showing: breast density category B; 0.7 cm mass in right breast at 12:30; normal right axillary lymph nodes.  Accordingly on 10/04/2020 she proceeded to biopsy of the right breast area in question. The pathology from this procedure ([id number]) showed: invasive mammary carcinoma, e-cadherin negative, grade 1; atypical lobular hyperplasia. Prognostic indicators significant for: estrogen receptor, 90% positive with moderate staining intensity and progesterone receptor, 0% negative. Proliferation marker Ki67 at 10%. HER2 negative by immunohistochemistry (1+).  Cancer Staging Malignant neoplasm of upper-inner quadrant of right breast in female, estrogen receptor positive ([name]) Staging form: Breast, AJCC 8th Edition - Clinical stage from 10/16/2020: Stage IA (cT1b, cN0, cM0, G1, ER+, PR-, HER2-) - Signed by Kimberly Cruel, MD on 10/16/2020 Stage prefix: Initial diagnosis Histologic grading system: 3 grade system Laterality: Right Staged by: Pathologist and managing physician Stage used in treatment planning: Yes National guidelines used in treatment planning: Yes Type of national guideline used in treatment planning: NCCN  The patient's subsequent history is as detailed below.   PAST MEDICAL HISTORY: Past Medical History:  Diagnosis Date   Cancer [facility])    Cataract    removed   Diabetes mellitus without complication ([place])    History of kidney stones    Hyperlipidemia    Hypertension  PONV (postoperative nausea and vomiting)     PAST SURGICAL HISTORY: Past Surgical History:  Procedure Laterality Date   ABDOMINAL HYSTERECTOMY     BREAST LUMPECTOMY WITH RADIOACTIVE SEED AND  SENTINEL LYMPH NODE BIOPSY Right 11/05/2020   Procedure: RIGHT BREAST LUMPECTOMY WITH RADIOACTIVE SEED AND RIGHT AXILLARY SENTINEL LYMPH NODE BIOPSY;  Surgeon: Kimberly Bookbinder, MD;  Location: [facility];  Service: General;  Laterality: Right;   CHOLECYSTECTOMY     KNEE SURGERY      FAMILY HISTORY: Family History  Problem Relation Age of Onset   Heart attack Mother    Diabetes Father    Colon cancer Maternal Grandmother    Breast cancer Cousin        dx early 2's   Her father died at age 4 from diabetes complications. Her mother died at age 17 from MI. [name] has two brothers (and no sisters). She reports breast cancer in a maternal cousin in her early 64's and colon cancer in a maternal grandmother.   GYNECOLOGIC HISTORY:  No LMP recorded. Patient has had a hysterectomy. Menarche: 65 years old Age at first live birth: 65 years old [name] P 3 LMP 02/2000 Contraceptive: used from 28 HRT never used  Hysterectomy? Yes, 02/2000 BSO? 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 Husband [name] is a retired Radiographer, therapeutic. Son [name], age 20, is a Geophysicist/field seismologist and youth pastor in [place]. Daughter [name], age 24, is a high school principal in [place]. Daughter [name], age 49, is a hair stylist here in [place]. [name] has two grandchildren. She attends a [name].    ADVANCED DIRECTIVES: In the absence of any documentation to the contrary, the patient's spouse is their HCPOA.    HEALTH MAINTENANCE: Social History   Tobacco Use   Smoking status: Former    Packs/day: 0.50    Years: 10.00    Pack years: 5.00    Types: Cigarettes    Quit date: 09/25/1981    Years since quitting: 39.2   Smokeless tobacco: Never  Vaping Use   Vaping Use: Never used  Substance Use Topics   Alcohol use: Never   Drug use: Never      Colonoscopy: 2018 ([name])  PAP: 08/2020  Bone density: 08/2020   Allergies  Allergen Reactions   Glimepiride     Hypoglycemia     Current Outpatient Medications  Medication Sig Dispense Refill   ACCU-CHEK GUIDE test strip      acetaminophen (TYLENOL) 500 MG tablet Take 1,000 mg by mouth every 6 (six) hours as needed for moderate pain.     amitriptyline (ELAVIL) 25 MG tablet Take 25 mg by mouth at bedtime.     Blood Glucose Monitoring Suppl (ACCU-CHEK GUIDE ME) w/Device KIT      cetirizine (ZYRTEC) 10 MG tablet Take 10 mg by mouth at bedtime.     Cholecalciferol (DIALYVITE VITAMIN D 5000) 125 MCG (5000 UT) capsule Take 5,000 Units by mouth daily.     dexamethasone (DECADRON) 4 MG tablet Take 2 tablets (8 mg total) by mouth 2 (two) times daily. Start the day before Taxotere. Then again the day after chemo for 3 days. 30 tablet 1   ibuprofen (ADVIL) 200 MG tablet Take 400 mg by mouth every 6 (six) hours as needed for moderate pain or headache.  Insulin Syringe-Needle U-100 (INSULIN SYRINGE .5CC/30GX5/16") 30G X 5/16" 0.5 ML MISC See admin instructions.     lidocaine-prilocaine (EMLA) cream Apply to affected area once (Patient not taking: Reported on 12/23/2020) 30 g 3   LORazepam (ATIVAN) 0.5 MG tablet Take 1 tablet (0.5 mg total) by mouth at bedtime as needed (Nausea or vomiting). 30 tablet 0   losartan (COZAAR) 25 MG tablet Take 25 mg by mouth daily.     metFORMIN (GLUCOPHAGE) 500 MG tablet Take 1,000 mg by mouth 2 (two) times daily.     Multiple Vitamin (MULTIVITAMIN WITH MINERALS) TABS tablet Take 1 tablet by mouth daily.     Probiotic Product (PROBIOTIC PO) Take 1 capsule by mouth daily.     prochlorperazine (COMPAZINE) 10 MG tablet Take 1 tablet (10 mg total) by mouth every 6 (six) hours as needed (Nausea or vomiting). 30 tablet 1   sertraline (ZOLOFT) 50 MG tablet Take 50 mg by mouth daily.     simvastatin (ZOCOR) 40 MG tablet Take 40 mg by mouth at bedtime.     TOUJEO  SOLOSTAR 300 UNIT/ML Solostar Pen Inject 45 Units into the skin daily.     No current facility-administered medications for this visit.    OBJECTIVE:   Vitals:   12/31/20 0819  BP: (!) 171/74  Pulse: 90  Resp: 18  Temp: 97.9 F (36.6 C)  SpO2: 95%      Body mass index is 32.83 kg/m.   Wt Readings from Last 3 Encounters:  12/31/20 209 lb 9.6 oz (95.1 kg)  12/30/20 195 lb (88.5 kg)  12/23/20 207 lb 4.8 oz (94 kg)     ECOG FS:1 - Symptomatic but completely ambulatory  Sclerae unicteric, EOMs intact Wearing a mask No cervical or supraclavicular adenopathy Lungs no rales or rhonchi Heart regular rate and rhythm Abd soft, nontender, positive bowel sounds MSK no focal spinal tenderness, no upper extremity lymphedema Neuro: nonfocal, well oriented, appropriate affect Breasts: The right breast is status postlumpectomy.  The cosmetic result is excellent.  There is some scar tissue in the area above the nipple/areolar complex but no erythema dehiscence or swelling.  The left breast and both axillae are benign   LAB RESULTS:  CMP     Component Value Date/Time   NA 138 12/19/2020 1453   K 4.2 12/19/2020 1453   CL 101 12/19/2020 1453   CO2 26 12/19/2020 1453   GLUCOSE 268 (H) 12/19/2020 1453   BUN 9 12/19/2020 1453   CREATININE 0.70 12/19/2020 1453   CREATININE 0.84 10/16/2020 0833   CALCIUM 9.4 12/19/2020 1453   PROT 7.3 10/16/2020 0833   ALBUMIN 3.8 10/16/2020 0833   AST 22 10/16/2020 0833   ALT 23 10/16/2020 0833   ALKPHOS 74 10/16/2020 0833   BILITOT 0.3 10/16/2020 0833   GFRNONAA >60 12/19/2020 1453   GFRNONAA >60 10/16/2020 0833    No results found for: TOTALPROTELP, ALBUMINELP, A1GS, A2GS, BETS, BETA2SER, GAMS, MSPIKE, SPEI  Lab Results  Component Value Date   WBC 7.6 12/31/2020   NEUTROABS 5.2 12/31/2020   HGB 11.1 (L) 12/31/2020   HCT 34.0 (L) 12/31/2020   MCV 89.9 12/31/2020   PLT 196 12/31/2020    No results found for: LABCA2  No components found  for: [id number]  No results for input(s): INR in the last 168 hours.  No results found for: LABCA2  No results found for: NFA213  No results found for: YQM578  No results found for: ION629  No  results found for: CA2729  No components found for: HGQUANT  No results found for: CEA1 / No results found for: CEA1   No results found for: AFPTUMOR  No results found for: CHROMOGRNA  No results found for: KPAFRELGTCHN, LAMBDASER, KAPLAMBRATIO (kappa/lambda light chains)  No results found for: HGBA, HGBA2QUANT, HGBFQUANT, HGBSQUAN (Hemoglobinopathy evaluation)   No results found for: LDH  No results found for: IRON, TIBC, IRONPCTSAT (Iron and TIBC)  No results found for: FERRITIN  Urinalysis No results found for: COLORURINE, APPEARANCEUR, LABSPEC, PHURINE, GLUCOSEU, HGBUR, BILIRUBINUR, KETONESUR, PROTEINUR, UROBILINOGEN, NITRITE, LEUKOCYTESUR   STUDIES: DG Chest 1 View  Result Date: 12/30/2020 CLINICAL DATA:  Chest port insertion EXAM: CHEST  1 VIEW COMPARISON:  None. FINDINGS: Single fluoroscopic image was obtained during the performance of the procedure and is provided for interpretation only. Image demonstrates a right chest wall port via internal jugular approach tip overlying superior vena cava. Please refer to the operative report. FLUOROSCOPY TIME:  19 seconds IMPRESSION: 1. Right chest wall port as above. Electronically Signed   By: Randa Ngo M.D.   On: 12/30/2020 15:32   DG C-Arm 1-60 Min-No Report  Result Date: 12/30/2020 Fluoroscopy was utilized by the requesting physician.  No radiographic interpretation.     ELIGIBLE FOR AVAILABLE RESEARCH PROTOCOL: no  ASSESSMENT: 65 y.o. [place] woman status post right breast upper inner quadrant biopsy 10/04/2020 for clinical T1b N0, stage IA invasive lobular carcinoma, E-cadherin negative, grade 1, estrogen receptor positive, progesterone receptor and HER2 negative, with an MIB-1 of 10%  (1) status post right  lumpectomy and sentinel lymph node sampling 11/05/2020 for a PT1b pN0, stage IA invasive lobular carcinoma, grade 2  (2) Oncotype score of 32 predicts a risk of recurrence outside the breast within the next 9 years of 20% if the patient's only systemic therapy is antiestrogens for 5 years.  It also predicts significant benefit from chemotherapy.  (3) adjuvant chemotherapy with cyclophosphamide and docetaxel every 21 days x 4 to start 12/31/2020  (4) adjuvant radiation to follow  (5) antiestrogens to start at the completion of local treatment.   PLAN: Jazmen we will start her adjuvant chemotherapy today.  I have asked her to keep a diary of symptoms so we can troubleshoot them when she returns 1 01/07/2021 when we will also check her nadir counts and decide if she needs supportive antibiotics.  She understands she is going to have aches and pains after her pegfilgrastim shot and she will take Tylenol plus Aleve for that as needed.  If that is not adequate she will let us know.  I have suggested she get some stool softeners and have them handy since many patients do become constipated from the antinausea medication she will receive today  I also urged her to call us if she has significant problems hydrating herself.  We can easily bring her in for fluids as needed  Total encounter time 25 minutes.Sarajane Jews C. Oluwatosin Bracy, MD 12/31/20 8:37 AM Medical Oncology and Hematology [facility] [place], [place] [address] Tel. [phone]    Fax. [phone]   I, Wilburn Mylar, am acting as scribe for Dr. Virgie Bridges. Ayron Fillinger.  I, Lurline Del MD, have reviewed the above documentation for accuracy and completeness, and I agree with the above.    *Total Encounter Time as defined by the Centers for Medicare and Medicaid Services includes, in addition to the face-to-face time of a patient visit (documented in the  note above) non-face-to-face time: obtaining and  reviewing outside history, ordering and reviewing medications, tests or procedures, care coordination (communications with other health care professionals or caregivers) and documentation in the medical record.

## 2020-12-30 NOTE — Progress Notes (Signed)
Called pt to introduce myself as her Financial Resource Specialist and to discuss the [employer] grant.  Unfortunately there aren't any foundations offering copay assistance for her Dx and the type of ins she has.  I left a msg requesting she return my call if she's interested in applying for the [employer] grant.    

## 2020-12-30 NOTE — H&P (Signed)
[name] is an 65 y.o. female.   Chief Complaint: breast cancer HPI: 82 yof s/p lumpectomy/sn biopsy for stage IA lobular cancer.  Doing well from that. Oncotype was 32 and due to begin chemo tomorrow.    Past Medical History:  Diagnosis Date   Cancer ([place])    Cataract    removed   Diabetes mellitus without complication ([place])    History of kidney stones    Hyperlipidemia    Hypertension    PONV (postoperative nausea and vomiting)     Past Surgical History:  Procedure Laterality Date   ABDOMINAL HYSTERECTOMY     BREAST LUMPECTOMY WITH RADIOACTIVE SEED AND SENTINEL LYMPH NODE BIOPSY Right 11/05/2020   Procedure: RIGHT BREAST LUMPECTOMY WITH RADIOACTIVE SEED AND RIGHT AXILLARY SENTINEL LYMPH NODE BIOPSY;  Surgeon: Rolm Bookbinder, MD;  Location: [facility];  Service: General;  Laterality: Right;   CHOLECYSTECTOMY     KNEE SURGERY      Family History  Problem Relation Age of Onset   Heart attack Mother    Diabetes Father    Colon cancer Maternal Grandmother    Breast cancer Cousin        dx early 92's   Social History:  reports that she quit smoking about 39 years ago. Her smoking use included cigarettes. She has a 5.00 pack-year smoking history. She has never used smokeless tobacco. She reports that she does not drink alcohol and does not use drugs.  Allergies:  Allergies  Allergen Reactions   Glimepiride     Hypoglycemia     Medications Prior to Admission  Medication Sig Dispense Refill   acetaminophen (TYLENOL) 500 MG tablet Take 1,000 mg by mouth every 6 (six) hours as needed for moderate pain.     amitriptyline (ELAVIL) 25 MG tablet Take 25 mg by mouth at bedtime.     cetirizine (ZYRTEC) 10 MG tablet Take 10 mg by mouth at bedtime.     Cholecalciferol (DIALYVITE VITAMIN D 5000) 125 MCG (5000 UT) capsule Take 5,000 Units by mouth daily.     ibuprofen (ADVIL) 200 MG tablet Take 400 mg by mouth every 6 (six) hours as needed for moderate pain or  headache.     losartan (COZAAR) 25 MG tablet Take 25 mg by mouth daily.     metFORMIN (GLUCOPHAGE) 500 MG tablet Take 1,000 mg by mouth 2 (two) times daily.     Multiple Vitamin (MULTIVITAMIN WITH MINERALS) TABS tablet Take 1 tablet by mouth daily.     Probiotic Product (PROBIOTIC PO) Take 1 capsule by mouth daily.     sertraline (ZOLOFT) 50 MG tablet Take 50 mg by mouth daily.     simvastatin (ZOCOR) 40 MG tablet Take 40 mg by mouth at bedtime.     TOUJEO SOLOSTAR 300 UNIT/ML Solostar Pen Inject 45 Units into the skin daily.     ACCU-CHEK GUIDE test strip      Blood Glucose Monitoring Suppl (ACCU-CHEK GUIDE ME) w/Device KIT      dexamethasone (DECADRON) 4 MG tablet Take 2 tablets (8 mg total) by mouth 2 (two) times daily. Start the day before Taxotere. Then again the day after chemo for 3 days. 30 tablet 1   Insulin Syringe-Needle U-100 (INSULIN SYRINGE .5CC/30GX5/16") 30G X 5/16" 0.5 ML MISC See admin instructions.     lidocaine-prilocaine (EMLA) cream Apply to affected area once (Patient not taking: Reported on 12/23/2020) 30 g 3   LORazepam (ATIVAN) 0.5 MG tablet Take  1 tablet (0.5 mg total) by mouth at bedtime as needed (Nausea or vomiting). 30 tablet 0   prochlorperazine (COMPAZINE) 10 MG tablet Take 1 tablet (10 mg total) by mouth every 6 (six) hours as needed (Nausea or vomiting). 30 tablet 1    Results for orders placed or performed during the hospital encounter of 12/30/20 (from the past 48 hour(s))  Glucose, capillary     Status: Abnormal   Collection Time: 12/30/20 10:46 AM  Result Value Ref Range   Glucose-Capillary 118 (H) 70 - 99 mg/dL    Comment: Glucose reference range applies only to samples taken after fasting for at least 8 hours.   No results found.  Review of Systems  All other systems reviewed and are negative.  Blood pressure (!) 177/84, pulse 84, temperature 97.7 F (36.5 C), temperature source Oral, resp. rate 18, height _0  (1.702 m), weight 88.5 kg, SpO2 96  %. Physical Exam Constitutional:      Appearance: Normal appearance.  Eyes:     General: No scleral icterus. Cardiovascular:     Rate and Rhythm: Normal rate.  Pulmonary:     Effort: Pulmonary effort is normal.  Chest:     Comments: Well healed incisions  Neurological:     Mental Status: She is alert.     Assessment/Plan Breast cancer, oncotype 32 s/p surgery Port placement for chemotherapy  Rolm Bookbinder, MD 12/30/2020, 11:47 AM

## 2020-12-30 NOTE — Op Note (Signed)
Preoperative diagnosis: right breast cancer, oncotype 32 Postoperative diagnosis: Same as above Procedure: Right internal jugular port placement Surgeon: Dr. Serita Grammes Anesthesia: General Estimated blood loss: Minimal Specimens: None Drains: None Complications: None Special count was correct completion Disposition recovery stable edition  Indications: 13 yof s/p lumpectomy/sn biopsy.  Oncotype is 32 and she is due to begin chemotherapy tomorrow.   Procedure: After informed consent was obtained she was taken to the operating room.   She was given antibiotics.  SCDs were in place.  She was placed under general anesthesia without complication.  She was prepped and draped in a standard sterile surgical fashion.  Surgical timeout was then performed.  I then identified her right internal jugular vein with the ultrasound.  I made a small nick in the skin.  I then accessed this on the first pass with the needle.  The wire was placed.  This was confirmed to be in the vein by the ultrasound as well as by fluoroscopy.  I then placed local into the area below her clavicle where the pocket was made.  I made incision and developed a pocket.  I then tunneled the line between the 2 sites.  The dilator was then placed over the wire.  This was done using fluoroscopy.  The wire was then removed.  The line was placed through the sheath and the sheath removed.  I pulled the line back to be in the distal cava near the cavoatrial junction.  This was a good position upon completion.  I then hooked the port up to the line.  I sutured this position with 2-0 Prolene suture.  I then accessed the port and left accessed for chemotherapy tomorrow.   Aspirated blood and flushed easily.  I placed heparin in the port.  I then got a final completion film that showed this all to be in good position.  I then closed with 3-0 Vicryl 4-0 Monocryl.  Glue was placed.  She tolerated this well and was transferred to recovery.

## 2020-12-31 ENCOUNTER — Inpatient Hospital Stay: Payer: Medicare HMO

## 2020-12-31 ENCOUNTER — Encounter: Payer: Self-pay | Admitting: *Deleted

## 2020-12-31 ENCOUNTER — Inpatient Hospital Stay (HOSPITAL_BASED_OUTPATIENT_CLINIC_OR_DEPARTMENT_OTHER): Payer: Medicare HMO | Admitting: Oncology

## 2020-12-31 ENCOUNTER — Encounter (HOSPITAL_COMMUNITY): Payer: Self-pay | Admitting: General Surgery

## 2020-12-31 VITALS — BP 171/74 | HR 90 | Temp 97.9°F | Resp 18 | Ht 67.0 in | Wt 209.6 lb

## 2020-12-31 VITALS — BP 143/81 | HR 90 | Resp 16

## 2020-12-31 DIAGNOSIS — C50211 Malignant neoplasm of upper-inner quadrant of right female breast: Secondary | ICD-10-CM

## 2020-12-31 DIAGNOSIS — Z170 Estrogen receptor positive status [ER+]: Secondary | ICD-10-CM

## 2020-12-31 DIAGNOSIS — Z23 Encounter for immunization: Secondary | ICD-10-CM | POA: Diagnosis not present

## 2020-12-31 DIAGNOSIS — Z5189 Encounter for other specified aftercare: Secondary | ICD-10-CM | POA: Diagnosis not present

## 2020-12-31 DIAGNOSIS — Z5111 Encounter for antineoplastic chemotherapy: Secondary | ICD-10-CM | POA: Diagnosis not present

## 2020-12-31 LAB — CMP (CANCER CENTER ONLY)
ALT: 25 U/L (ref 0–44)
AST: 24 U/L (ref 15–41)
Albumin: 3.6 g/dL (ref 3.5–5.0)
Alkaline Phosphatase: 77 U/L (ref 38–126)
Anion gap: 12 (ref 5–15)
BUN: 8 mg/dL (ref 8–23)
CO2: 24 mmol/L (ref 22–32)
Calcium: 9.2 mg/dL (ref 8.9–10.3)
Chloride: 102 mmol/L (ref 98–111)
Creatinine: 0.76 mg/dL (ref 0.44–1.00)
GFR, Estimated: >60 mL/min (ref >60)
Glucose, Bld: 233 mg/dL — ABNORMAL HIGH (ref 70–99)
Potassium: 4.4 mmol/L (ref 3.5–5.1)
Sodium: 138 mmol/L (ref 135–145)
Total Bilirubin: 0.3 mg/dL (ref 0.3–1.2)
Total Protein: 6.8 g/dL (ref 6.5–8.1)

## 2020-12-31 LAB — CBC WITH DIFFERENTIAL (CANCER CENTER ONLY)
Abs Immature Granulocytes: 0.02 10*3/uL (ref 0.00–0.07)
Basophils Absolute: 0 10*3/uL (ref 0.0–0.1)
Basophils Relative: 1 %
Eosinophils Absolute: 0.2 10*3/uL (ref 0.0–0.5)
Eosinophils Relative: 2 %
HCT: 34 % — ABNORMAL LOW (ref 36.0–46.0)
Hemoglobin: 11.1 g/dL — ABNORMAL LOW (ref 12.0–15.0)
Immature Granulocytes: 0 %
Lymphocytes Relative: 22 %
Lymphs Abs: 1.6 10*3/uL (ref 0.7–4.0)
MCH: 29.4 pg (ref 26.0–34.0)
MCHC: 32.6 g/dL (ref 30.0–36.0)
MCV: 89.9 fL (ref 80.0–100.0)
Monocytes Absolute: 0.5 10*3/uL (ref 0.1–1.0)
Monocytes Relative: 6 %
Neutro Abs: 5.2 10*3/uL (ref 1.7–7.7)
Neutrophils Relative %: 69 %
Platelet Count: 196 10*3/uL (ref 150–400)
RBC: 3.78 MIL/uL — ABNORMAL LOW (ref 3.87–5.11)
RDW: 13.3 % (ref 11.5–15.5)
WBC Count: 7.6 10*3/uL (ref 4.0–10.5)
nRBC: 0 % (ref 0.0–0.2)

## 2020-12-31 MED ORDER — SODIUM CHLORIDE 0.9 % IV SOLN
75.0000 mg/m2 | Freq: Once | INTRAVENOUS | Status: AC
  Administered 2020-12-31: 160 mg via INTRAVENOUS
  Filled 2020-12-31: qty 16

## 2020-12-31 MED ORDER — SODIUM CHLORIDE 0.9% FLUSH
10.0000 mL | INTRAVENOUS | Status: DC | PRN
Start: 2020-12-31 — End: 2020-12-31
  Administered 2020-12-31: 10 mL

## 2020-12-31 MED ORDER — PROCHLORPERAZINE EDISYLATE 10 MG/2ML IJ SOLN
10.0000 mg | Freq: Once | INTRAMUSCULAR | Status: AC
  Administered 2020-12-31: 10 mg via INTRAVENOUS
  Filled 2020-12-31: qty 2

## 2020-12-31 MED ORDER — SODIUM CHLORIDE 0.9 % IV SOLN
10.0000 mg | Freq: Once | INTRAVENOUS | Status: AC
  Administered 2020-12-31: 10 mg via INTRAVENOUS
  Filled 2020-12-31: qty 10

## 2020-12-31 MED ORDER — SODIUM CHLORIDE 0.9 % IV SOLN: Freq: Once | INTRAVENOUS | Status: AC

## 2020-12-31 MED ORDER — HEPARIN SOD (PORK) LOCK FLUSH 100 UNIT/ML IV SOLN
500.0000 [IU] | Freq: Once | INTRAVENOUS | Status: AC | PRN
  Administered 2020-12-31: 500 [IU]

## 2020-12-31 MED ORDER — PALONOSETRON HCL INJECTION 0.25 MG/5ML
0.2500 mg | Freq: Once | INTRAVENOUS | Status: AC
  Administered 2020-12-31: 0.25 mg via INTRAVENOUS
  Filled 2020-12-31: qty 5

## 2020-12-31 MED ORDER — SODIUM CHLORIDE 0.9 % IV SOLN
600.0000 mg/m2 | Freq: Once | INTRAVENOUS | Status: AC
  Administered 2020-12-31: 1240 mg via INTRAVENOUS
  Filled 2020-12-31: qty 62

## 2020-12-31 NOTE — Patient Instructions (Signed)
[facility]   Discharge Instructions: Thank you for choosing [facility] to provide your oncology and hematology care.   If you have a lab appointment with the [facility], please go directly to the [facility] and check in at the registration area.   Wear comfortable clothing and clothing appropriate for easy access to any Portacath or PICC line.   We strive to give you quality time with your provider. You may need to reschedule your appointment if you arrive late (15 or more minutes).  Arriving late affects you and other patients whose appointments are after yours.  Also, if you miss three or more appointments without notifying the office, you may be dismissed from the clinic at the provider's discretion.      For prescription refill requests, have your pharmacy contact our office and allow 72 hours for refills to be completed.    Today you received the following chemotherapy and/or immunotherapy agents: docetaxel (Taxotere) and cyclophosphamide (Cytoxan).      To help prevent nausea and vomiting after your treatment, we encourage you to take your nausea medication as directed.  BELOW ARE SYMPTOMS THAT SHOULD BE REPORTED IMMEDIATELY: *FEVER GREATER THAN 100.4 F (38 C) OR HIGHER *CHILLS OR SWEATING *NAUSEA AND VOMITING THAT IS NOT CONTROLLED WITH YOUR NAUSEA MEDICATION *UNUSUAL SHORTNESS OF BREATH *UNUSUAL BRUISING OR BLEEDING *URINARY PROBLEMS (pain or burning when urinating, or frequent urination) *BOWEL PROBLEMS (unusual diarrhea, constipation, pain near the anus) TENDERNESS IN MOUTH AND THROAT WITH OR WITHOUT PRESENCE OF ULCERS (sore throat, sores in mouth, or a toothache) UNUSUAL RASH, SWELLING OR PAIN  UNUSUAL VAGINAL DISCHARGE OR ITCHING   Items with * indicate a potential emergency and should be followed up as soon as possible or go to the Emergency Department if any problems should occur.  Please show the CHEMOTHERAPY ALERT  CARD or IMMUNOTHERAPY ALERT CARD at check-in to the Emergency Department and triage nurse.  Should you have questions after your visit or need to cancel or reschedule your appointment, please contact [facility]  Dept: [phone]  and follow the prompts.  Office hours are 8:00 a.m. to 4:30 p.m. Monday - Friday. Please note that voicemails left after 4:00 p.m. may not be returned until the following business day.  We are closed weekends and major holidays. You have access to a nurse at all times for urgent questions. Please call the main number to the clinic Dept: [phone] and follow the prompts.   For any non-urgent questions, you may also contact your provider using MyChart. We now offer e-Visits for anyone 54 and older to request care online for non-urgent symptoms. For details visit [email or web address].   Also download the MyChart app! Go to the app store, search "MyChart", open the app, select [facility], and log in with your MyChart username and password.  Due to Covid, a mask is required upon entering the hospital/clinic. If you do not have a mask, one will be given to you upon arrival. For doctor visits, patients may have 1 support person aged 66 or older with them. For treatment visits, patients cannot have anyone with them due to current Covid guidelines and our immunocompromised population.  Docetaxel injection What is this medication? DOCETAXEL (doe se TAX el) is a chemotherapy drug. It targets fast dividing cells, like cancer cells, and causes these cells to die. This medicine is used to treat many types of cancers like breast cancer, certain stomach cancers,  head and neck cancer, lung cancer, and prostate cancer. This medicine may be used for other purposes; ask your health care provider or pharmacist if you have questions. COMMON BRAND NAME(S): Docefrez, Taxotere What should I tell my care team before I take this medication? They need to know if  you have any of these conditions: infection (especially a virus infection such as chickenpox, cold sores, or herpes) liver disease low blood counts, like low white cell, platelet, or red cell counts an unusual or allergic reaction to docetaxel, polysorbate 80, other chemotherapy agents, other medicines, foods, dyes, or preservatives pregnant or trying to get pregnant breast-feeding How should I use this medication? This drug is given as an infusion into a vein. It is administered in a hospital or clinic by a specially trained health care professional. Talk to your pediatrician regarding the use of this medicine in children. Special care may be needed. Overdosage: If you think you have taken too much of this medicine contact a poison control center or emergency room at once. NOTE: This medicine is only for you. Do not share this medicine with others. What if I miss a dose? It is important not to miss your dose. Call your doctor or health care professional if you are unable to keep an appointment. What may interact with this medication? Do not take this medicine with any of the following medications: live virus vaccines This medicine may also interact with the following medications: aprepitant certain antibiotics like erythromycin or clarithromycin certain antivirals for HIV or hepatitis certain medicines for fungal infections like fluconazole, itraconazole, ketoconazole, posaconazole, or voriconazole cimetidine ciprofloxacin conivaptan cyclosporine dronedarone fluvoxamine grapefruit juice imatinib verapamil This list may not describe all possible interactions. Give your health care provider a list of all the medicines, herbs, non-prescription drugs, or dietary supplements you use. Also tell them if you smoke, drink alcohol, or use illegal drugs. Some items may interact with your medicine. What should I watch for while using this medication? Your condition will be monitored carefully  while you are receiving this medicine. You will need important blood work done while you are taking this medicine. Call your doctor or health care professional for advice if you get a fever, chills or sore throat, or other symptoms of a cold or flu. Do not treat yourself. This drug decreases your body's ability to fight infections. Try to avoid being around people who are sick. Some products may contain alcohol. Ask your health care professional if this medicine contains alcohol. Be sure to tell all health care professionals you are taking this medicine. Certain medicines, like metronidazole and disulfiram, can cause an unpleasant reaction when taken with alcohol. The reaction includes flushing, headache, nausea, vomiting, sweating, and increased thirst. The reaction can last from 30 minutes to several hours. You may get drowsy or dizzy. Do not drive, use machinery, or do anything that needs mental alertness until you know how this medicine affects you. Do not stand or sit up quickly, especially if you are an older patient. This reduces the risk of dizzy or fainting spells. Alcohol may interfere with the effect of this medicine. Talk to your health care professional about your risk of cancer. You may be more at risk for certain types of cancer if you take this medicine. Do not become pregnant while taking this medicine or for 6 months after stopping it. Women should inform their doctor if they wish to become pregnant or think they might be pregnant. There is a potential for  serious side effects to an unborn child. Talk to your health care professional or pharmacist for more information. Do not breast-feed an infant while taking this medicine or for 1 week after stopping it. Males who get this medicine must use a condom during sex with females who can get pregnant. If you get a woman pregnant, the baby could have birth defects. The baby could die before they are born. You will need to continue wearing a condom  for 3 months after stopping the medicine. Tell your health care provider right away if your partner becomes pregnant while you are taking this medicine. This may interfere with the ability to father a child. You should talk to your doctor or health care professional if you are concerned about your fertility. What side effects may I notice from receiving this medication? Side effects that you should report to your doctor or health care professional as soon as possible: allergic reactions like skin rash, itching or hives, swelling of the face, lips, or tongue blurred vision breathing problems changes in vision low blood counts - This drug may decrease the number of white blood cells, red blood cells and platelets. You may be at increased risk for infections and bleeding. nausea and vomiting pain, redness or irritation at site where injected pain, tingling, numbness in the hands or feet redness, blistering, peeling, or loosening of the skin, including inside the mouth signs of decreased platelets or bleeding - bruising, pinpoint red spots on the skin, black, tarry stools, nosebleeds signs of decreased red blood cells - unusually weak or tired, fainting spells, lightheadedness signs of infection - fever or chills, cough, sore throat, pain or difficulty passing urine swelling of the ankle, feet, hands Side effects that usually do not require medical attention (report to your doctor or health care professional if they continue or are bothersome): constipation diarrhea fingernail or toenail changes hair loss loss of appetite mouth sores muscle pain This list may not describe all possible side effects. Call your doctor for medical advice about side effects. You may report side effects to FDA at 1-800-FDA-1088. Where should I keep my medication? This drug is given in a hospital or clinic and will not be stored at home. NOTE: This sheet is a summary. It may not cover all possible information. If  you have questions about this medicine, talk to your doctor, pharmacist, or health care provider.  2022 Elsevier/Gold Standard (2019-02-06 19:50:31)  Cyclophosphamide Injection What is this medication? CYCLOPHOSPHAMIDE (sye kloe FOSS fa mide) is a chemotherapy drug. It slows the growth of cancer cells. This medicine is used to treat many types of cancer like lymphoma, myeloma, leukemia, breast cancer, and ovarian cancer, to name a few. This medicine may be used for other purposes; ask your health care provider or pharmacist if you have questions. COMMON BRAND NAME(S): Cytoxan, Neosar What should I tell my care team before I take this medication? They need to know if you have any of these conditions: heart disease history of irregular heartbeat infection kidney disease liver disease low blood counts, like white cells, platelets, or red blood cells on hemodialysis recent or ongoing radiation therapy scarring or thickening of the lungs trouble passing urine an unusual or allergic reaction to cyclophosphamide, other medicines, foods, dyes, or preservatives pregnant or trying to get pregnant breast-feeding How should I use this medication? This drug is usually given as an injection into a vein or muscle or by infusion into a vein. It is administered in a hospital  or clinic by a specially trained health care professional. Talk to your pediatrician regarding the use of this medicine in children. Special care may be needed. Overdosage: If you think you have taken too much of this medicine contact a poison control center or emergency room at once. NOTE: This medicine is only for you. Do not share this medicine with others. What if I miss a dose? It is important not to miss your dose. Call your doctor or health care professional if you are unable to keep an appointment. What may interact with this medication? amphotericin B azathioprine certain antivirals for HIV or hepatitis certain  medicines for blood pressure, heart disease, irregular heart beat certain medicines that treat or prevent blood clots like warfarin certain other medicines for cancer cyclosporine etanercept indomethacin medicines that relax muscles for surgery medicines to increase blood counts metronidazole This list may not describe all possible interactions. Give your health care provider a list of all the medicines, herbs, non-prescription drugs, or dietary supplements you use. Also tell them if you smoke, drink alcohol, or use illegal drugs. Some items may interact with your medicine. What should I watch for while using this medication? Your condition will be monitored carefully while you are receiving this medicine. You may need blood work done while you are taking this medicine. Drink water or other fluids as directed. Urinate often, even at night. Some products may contain alcohol. Ask your health care professional if this medicine contains alcohol. Be sure to tell all health care professionals you are taking this medicine. Certain medicines, like metronidazole and disulfiram, can cause an unpleasant reaction when taken with alcohol. The reaction includes flushing, headache, nausea, vomiting, sweating, and increased thirst. The reaction can last from 30 minutes to several hours. Do not become pregnant while taking this medicine or for 1 year after stopping it. Women should inform their health care professional if they wish to become pregnant or think they might be pregnant. Men should not father a child while taking this medicine and for 4 months after stopping it. There is potential for serious side effects to an unborn child. Talk to your health care professional for more information. Do not breast-feed an infant while taking this medicine or for 1 week after stopping it. This medicine has caused ovarian failure in some women. This medicine may make it more difficult to get pregnant. Talk to your health  care professional if you are concerned about your fertility. This medicine has caused decreased sperm counts in some men. This may make it more difficult to father a child. Talk to your health care professional if you are concerned about your fertility. Call your health care professional for advice if you get a fever, chills, or sore throat, or other symptoms of a cold or flu. Do not treat yourself. This medicine decreases your body's ability to fight infections. Try to avoid being around people who are sick. Avoid taking medicines that contain aspirin, acetaminophen, ibuprofen, naproxen, or ketoprofen unless instructed by your health care professional. These medicines may hide a fever. Talk to your health care professional about your risk of cancer. You may be more at risk for certain types of cancer if you take this medicine. If you are going to need surgery or other procedure, tell your health care professional that you are using this medicine. Be careful brushing or flossing your teeth or using a toothpick because you may get an infection or bleed more easily. If you have any dental work  done, tell your dentist you are receiving this medicine. What side effects may I notice from receiving this medication? Side effects that you should report to your doctor or health care professional as soon as possible: allergic reactions like skin rash, itching or hives, swelling of the face, lips, or tongue breathing problems nausea, vomiting signs and symptoms of bleeding such as bloody or black, tarry stools; red or dark brown urine; spitting up blood or brown material that looks like coffee grounds; red spots on the skin; unusual bruising or bleeding from the eyes, gums, or nose signs and symptoms of heart failure like fast, irregular heartbeat, sudden weight gain; swelling of the ankles, feet, hands signs and symptoms of infection like fever; chills; cough; sore throat; pain or trouble passing urine signs  and symptoms of kidney injury like trouble passing urine or change in the amount of urine signs and symptoms of liver injury like dark yellow or brown urine; general ill feeling or flu-like symptoms; light-colored stools; loss of appetite; nausea; right upper belly pain; unusually weak or tired; yellowing of the eyes or skin Side effects that usually do not require medical attention (report to your doctor or health care professional if they continue or are bothersome): confusion decreased hearing diarrhea facial flushing hair loss headache loss of appetite missed menstrual periods signs and symptoms of low red blood cells or anemia such as unusually weak or tired; feeling faint or lightheaded; falls skin discoloration This list may not describe all possible side effects. Call your doctor for medical advice about side effects. You may report side effects to FDA at 1-800-FDA-1088. Where should I keep my medication? This drug is given in a hospital or clinic and will not be stored at home. NOTE: This sheet is a summary. It may not cover all possible information. If you have questions about this medicine, talk to your doctor, pharmacist, or health care provider.  2022 Elsevier/Gold Standard (2018-12-12 09:53:29)

## 2021-01-01 NOTE — Anesthesia Postprocedure Evaluation (Signed)
Anesthesia Post Note  Patient: [name]  Procedure(s) Performed: INSERTION PORT-A-CATH (Right: Chest)     Patient location during evaluation: PACU Anesthesia Type: General Level of consciousness: awake and alert Pain management: pain level controlled Vital Signs Assessment: post-procedure vital signs reviewed and stable Respiratory status: spontaneous breathing, nonlabored ventilation, respiratory function stable and patient connected to nasal cannula oxygen Cardiovascular status: blood pressure returned to baseline and stable Postop Assessment: no apparent nausea or vomiting Anesthetic complications: no   No notable events documented.  Last Vitals:  Vitals:   12/30/20 1345 12/30/20 1350  BP:  (!) 161/78  Pulse: 86 84  Resp: 17 17  Temp:  36.7 C  SpO2: 95% 93%    Last Pain:  Vitals:   12/30/20 1345  TempSrc:   PainSc: 6                  Blaire Palomino

## 2021-01-02 ENCOUNTER — Other Ambulatory Visit: Payer: Self-pay

## 2021-01-02 ENCOUNTER — Inpatient Hospital Stay: Payer: Medicare HMO

## 2021-01-02 VITALS — BP 152/69 | HR 87 | Temp 99.6°F | Resp 20

## 2021-01-02 DIAGNOSIS — Z5111 Encounter for antineoplastic chemotherapy: Secondary | ICD-10-CM | POA: Diagnosis not present

## 2021-01-02 DIAGNOSIS — C50211 Malignant neoplasm of upper-inner quadrant of right female breast: Secondary | ICD-10-CM | POA: Diagnosis not present

## 2021-01-02 DIAGNOSIS — Z23 Encounter for immunization: Secondary | ICD-10-CM | POA: Diagnosis not present

## 2021-01-02 DIAGNOSIS — Z170 Estrogen receptor positive status [ER+]: Secondary | ICD-10-CM | POA: Diagnosis not present

## 2021-01-02 DIAGNOSIS — Z5189 Encounter for other specified aftercare: Secondary | ICD-10-CM | POA: Diagnosis not present

## 2021-01-02 MED ORDER — PEGFILGRASTIM-CBQV 6 MG/0.6ML ~~LOC~~ SOSY
6.0000 mg | PREFILLED_SYRINGE | Freq: Once | SUBCUTANEOUS | Status: AC
  Administered 2021-01-02: 6 mg via SUBCUTANEOUS
  Filled 2021-01-02: qty 0.6

## 2021-01-02 NOTE — Patient Instructions (Signed)

## 2021-01-06 ENCOUNTER — Emergency Department (HOSPITAL_COMMUNITY): Payer: Medicare HMO

## 2021-01-06 ENCOUNTER — Inpatient Hospital Stay (HOSPITAL_COMMUNITY)
Admission: EM | Admit: 2021-01-06 | Discharge: 2021-01-13 | DRG: 872 | Disposition: A | Discharge status: Home / Self Care | Payer: Medicare HMO | Attending: Family Medicine | Admitting: Family Medicine

## 2021-01-06 ENCOUNTER — Telehealth: Payer: Self-pay | Admitting: *Deleted

## 2021-01-06 ENCOUNTER — Other Ambulatory Visit: Payer: Self-pay

## 2021-01-06 ENCOUNTER — Encounter (HOSPITAL_COMMUNITY): Payer: Self-pay

## 2021-01-06 DIAGNOSIS — R0902 Hypoxemia: Secondary | ICD-10-CM | POA: Diagnosis not present

## 2021-01-06 DIAGNOSIS — L03313 Cellulitis of chest wall: Secondary | ICD-10-CM | POA: Diagnosis present

## 2021-01-06 DIAGNOSIS — L02411 Cutaneous abscess of right axilla: Secondary | ICD-10-CM | POA: Diagnosis present

## 2021-01-06 DIAGNOSIS — Z20822 Contact with and (suspected) exposure to covid-19: Secondary | ICD-10-CM | POA: Diagnosis present

## 2021-01-06 DIAGNOSIS — N390 Urinary tract infection, site not specified: Secondary | ICD-10-CM | POA: Diagnosis not present

## 2021-01-06 DIAGNOSIS — C50211 Malignant neoplasm of upper-inner quadrant of right female breast: Secondary | ICD-10-CM | POA: Diagnosis not present

## 2021-01-06 DIAGNOSIS — Z79899 Other long term (current) drug therapy: Secondary | ICD-10-CM

## 2021-01-06 DIAGNOSIS — Z170 Estrogen receptor positive status [ER+]: Secondary | ICD-10-CM | POA: Diagnosis not present

## 2021-01-06 DIAGNOSIS — R6510 Systemic inflammatory response syndrome (SIRS) of non-infectious origin without acute organ dysfunction: Secondary | ICD-10-CM

## 2021-01-06 DIAGNOSIS — A401 Sepsis due to streptococcus, group B: Principal | ICD-10-CM | POA: Diagnosis present

## 2021-01-06 DIAGNOSIS — Z90710 Acquired absence of both cervix and uterus: Secondary | ICD-10-CM

## 2021-01-06 DIAGNOSIS — N61 Mastitis without abscess: Secondary | ICD-10-CM

## 2021-01-06 DIAGNOSIS — R0602 Shortness of breath: Secondary | ICD-10-CM | POA: Diagnosis not present

## 2021-01-06 DIAGNOSIS — Z888 Allergy status to other drugs, medicaments and biological substances status: Secondary | ICD-10-CM

## 2021-01-06 DIAGNOSIS — Z794 Long term (current) use of insulin: Secondary | ICD-10-CM | POA: Diagnosis not present

## 2021-01-06 DIAGNOSIS — Z9221 Personal history of antineoplastic chemotherapy: Secondary | ICD-10-CM | POA: Diagnosis not present

## 2021-01-06 DIAGNOSIS — E1165 Type 2 diabetes mellitus with hyperglycemia: Secondary | ICD-10-CM | POA: Diagnosis not present

## 2021-01-06 DIAGNOSIS — I10 Essential (primary) hypertension: Secondary | ICD-10-CM | POA: Diagnosis not present

## 2021-01-06 DIAGNOSIS — Z833 Family history of diabetes mellitus: Secondary | ICD-10-CM | POA: Diagnosis not present

## 2021-01-06 DIAGNOSIS — Z7984 Long term (current) use of oral hypoglycemic drugs: Secondary | ICD-10-CM

## 2021-01-06 DIAGNOSIS — D171 Benign lipomatous neoplasm of skin and subcutaneous tissue of trunk: Secondary | ICD-10-CM | POA: Diagnosis not present

## 2021-01-06 DIAGNOSIS — R509 Fever, unspecified: Secondary | ICD-10-CM | POA: Diagnosis not present

## 2021-01-06 DIAGNOSIS — Z95828 Presence of other vascular implants and grafts: Secondary | ICD-10-CM

## 2021-01-06 DIAGNOSIS — Z803 Family history of malignant neoplasm of breast: Secondary | ICD-10-CM | POA: Diagnosis not present

## 2021-01-06 DIAGNOSIS — L042 Acute lymphadenitis of upper limb: Secondary | ICD-10-CM | POA: Diagnosis not present

## 2021-01-06 DIAGNOSIS — Z87891 Personal history of nicotine dependence: Secondary | ICD-10-CM | POA: Diagnosis not present

## 2021-01-06 DIAGNOSIS — Z8249 Family history of ischemic heart disease and other diseases of the circulatory system: Secondary | ICD-10-CM | POA: Diagnosis not present

## 2021-01-06 DIAGNOSIS — C50919 Malignant neoplasm of unspecified site of unspecified female breast: Secondary | ICD-10-CM | POA: Diagnosis not present

## 2021-01-06 DIAGNOSIS — Z9049 Acquired absence of other specified parts of digestive tract: Secondary | ICD-10-CM

## 2021-01-06 DIAGNOSIS — I700 Atherosclerosis of aorta: Secondary | ICD-10-CM | POA: Diagnosis not present

## 2021-01-06 DIAGNOSIS — E785 Hyperlipidemia, unspecified: Secondary | ICD-10-CM | POA: Diagnosis present

## 2021-01-06 DIAGNOSIS — I2510 Atherosclerotic heart disease of native coronary artery without angina pectoris: Secondary | ICD-10-CM | POA: Diagnosis present

## 2021-01-06 DIAGNOSIS — K5792 Diverticulitis of intestine, part unspecified, without perforation or abscess without bleeding: Secondary | ICD-10-CM

## 2021-01-06 DIAGNOSIS — R1110 Vomiting, unspecified: Secondary | ICD-10-CM | POA: Diagnosis not present

## 2021-01-06 DIAGNOSIS — N611 Abscess of the breast and nipple: Secondary | ICD-10-CM | POA: Diagnosis not present

## 2021-01-06 DIAGNOSIS — R109 Unspecified abdominal pain: Secondary | ICD-10-CM | POA: Diagnosis not present

## 2021-01-06 DIAGNOSIS — E119 Type 2 diabetes mellitus without complications: Secondary | ICD-10-CM | POA: Diagnosis not present

## 2021-01-06 DIAGNOSIS — J9811 Atelectasis: Secondary | ICD-10-CM | POA: Diagnosis not present

## 2021-01-06 DIAGNOSIS — K5909 Other constipation: Secondary | ICD-10-CM | POA: Diagnosis not present

## 2021-01-06 DIAGNOSIS — Z7985 Long-term (current) use of injectable non-insulin antidiabetic drugs: Secondary | ICD-10-CM

## 2021-01-06 DIAGNOSIS — K5732 Diverticulitis of large intestine without perforation or abscess without bleeding: Secondary | ICD-10-CM | POA: Diagnosis not present

## 2021-01-06 DIAGNOSIS — D1779 Benign lipomatous neoplasm of other sites: Secondary | ICD-10-CM | POA: Diagnosis not present

## 2021-01-06 DIAGNOSIS — R079 Chest pain, unspecified: Secondary | ICD-10-CM | POA: Diagnosis not present

## 2021-01-06 DIAGNOSIS — A419 Sepsis, unspecified organism: Secondary | ICD-10-CM | POA: Diagnosis not present

## 2021-01-06 DIAGNOSIS — R300 Dysuria: Secondary | ICD-10-CM | POA: Diagnosis not present

## 2021-01-06 LAB — RESP PANEL BY RT-PCR (FLU A&B, COVID) ARPGX2
Influenza A by PCR: NEGATIVE
Influenza B by PCR: NEGATIVE
SARS Coronavirus 2 by RT PCR: NEGATIVE

## 2021-01-06 LAB — CBC
HCT: 38.6 % (ref 36.0–46.0)
Hemoglobin: 12.7 g/dL (ref 12.0–15.0)
MCH: 29.4 pg (ref 26.0–34.0)
MCHC: 32.9 g/dL (ref 30.0–36.0)
MCV: 89.4 fL (ref 80.0–100.0)
Platelets: 255 10*3/uL (ref 150–400)
RBC: 4.32 MIL/uL (ref 3.87–5.11)
RDW: 13.2 % (ref 11.5–15.5)
WBC: 6.9 10*3/uL (ref 4.0–10.5)
nRBC: 0.6 % — ABNORMAL HIGH (ref 0.0–0.2)

## 2021-01-06 LAB — BLOOD GAS, VENOUS
Acid-base deficit: 0.3 mmol/L (ref 0.0–2.0)
Bicarbonate: 24.3 mmol/L (ref 20.0–28.0)
O2 Saturation: 82.4 %
Patient temperature: 98.6
pCO2, Ven: 42.1 mmHg — ABNORMAL LOW (ref 44.0–60.0)
pH, Ven: 7.38 (ref 7.250–7.430)
pO2, Ven: 51.6 mmHg — ABNORMAL HIGH (ref 32.0–45.0)

## 2021-01-06 LAB — URINALYSIS, ROUTINE W REFLEX MICROSCOPIC
Bacteria, UA: NONE SEEN
Bilirubin Urine: NEGATIVE
Glucose, UA: 150 mg/dL — AB
Hgb urine dipstick: NEGATIVE
Ketones, ur: NEGATIVE mg/dL
Nitrite: POSITIVE — AB
Protein, ur: 30 mg/dL — AB
Specific Gravity, Urine: >1.046 — ABNORMAL HIGH (ref 1.005–1.030)
pH: 5 (ref 5.0–8.0)

## 2021-01-06 LAB — COMPREHENSIVE METABOLIC PANEL
ALT: 36 U/L (ref 0–44)
AST: 29 U/L (ref 15–41)
Albumin: 3.8 g/dL (ref 3.5–5.0)
Alkaline Phosphatase: 72 U/L (ref 38–126)
Anion gap: 12 (ref 5–15)
BUN: 10 mg/dL (ref 8–23)
CO2: 22 mmol/L (ref 22–32)
Calcium: 8.6 mg/dL — ABNORMAL LOW (ref 8.9–10.3)
Chloride: 94 mmol/L — ABNORMAL LOW (ref 98–111)
Creatinine, Ser: 0.61 mg/dL (ref 0.44–1.00)
GFR, Estimated: >60 mL/min (ref >60)
Glucose, Bld: 301 mg/dL — ABNORMAL HIGH (ref 70–99)
Potassium: 3.7 mmol/L (ref 3.5–5.1)
Sodium: 128 mmol/L — ABNORMAL LOW (ref 135–145)
Total Bilirubin: 0.6 mg/dL (ref 0.3–1.2)
Total Protein: 7.4 g/dL (ref 6.5–8.1)

## 2021-01-06 LAB — LACTIC ACID, PLASMA: Lactic Acid, Venous: 2.1 mmol/L (ref 0.5–1.9)

## 2021-01-06 LAB — LIPASE, BLOOD: Lipase: 20 U/L (ref 11–51)

## 2021-01-06 MED ORDER — SODIUM CHLORIDE 0.9 % IV SOLN
2.0000 g | Freq: Once | INTRAVENOUS | Status: AC
  Administered 2021-01-06: 2 g via INTRAVENOUS
  Filled 2021-01-06: qty 2

## 2021-01-06 MED ORDER — ACETAMINOPHEN 500 MG PO TABS
1000.0000 mg | ORAL_TABLET | Freq: Once | ORAL | Status: AC
  Administered 2021-01-06: 1000 mg via ORAL
  Filled 2021-01-06: qty 2

## 2021-01-06 MED ORDER — VANCOMYCIN HCL IN DEXTROSE 1-5 GM/200ML-% IV SOLN
1000.0000 mg | Freq: Once | INTRAVENOUS | Status: AC
  Administered 2021-01-06: 1000 mg via INTRAVENOUS
  Filled 2021-01-06: qty 200

## 2021-01-06 MED ORDER — IOHEXOL 350 MG/ML SOLN
80.0000 mL | Freq: Once | INTRAVENOUS | Status: AC | PRN
  Administered 2021-01-06: 80 mL via INTRAVENOUS

## 2021-01-06 MED ORDER — ONDANSETRON 4 MG PO TBDP
4.0000 mg | ORAL_TABLET | Freq: Once | ORAL | Status: AC
  Administered 2021-01-06: 4 mg via ORAL
  Filled 2021-01-06: qty 1

## 2021-01-06 MED ORDER — SODIUM CHLORIDE 0.9 % IV BOLUS
1000.0000 mL | Freq: Once | INTRAVENOUS | Status: AC
  Administered 2021-01-06: 1000 mL via INTRAVENOUS

## 2021-01-06 NOTE — ED Notes (Signed)
Pt. Placed on purewick.  

## 2021-01-06 NOTE — Progress Notes (Signed)
A consult was received from an ED physician for Cefepime per pharmacy dosing.  The patient's profile has been reviewed for ht/wt/allergies/indication/available labs.    A one time order has been placed for Cefepime 2gm IV.    Further antibiotics/pharmacy consults should be ordered by admitting physician if indicated.                       Thank you, Everette Rank, PharmD 01/06/2021  7:22 PM

## 2021-01-06 NOTE — ED Provider Notes (Addendum)
Emergency Medicine Provider Triage Evaluation Note  [name] , a 65 y.o. female  was evaluated in triage.  Pt complains of nausea and dysuria.  Patient has breast cancer and started chemotherapy 6 days ago.  Began to feel nauseous today.  Has not vomited.  Endorses a 100 degree fever at home.  Review of Systems  Positive: Nauseous, dysuria Negative: Vomiting  Physical Exam  BP (!) 183/74 (BP Location: Right Arm)   Pulse (!) 125   Temp 98.7 F (37.1 C) (Oral)   Resp 18   Ht 5\' 7"  (1.702 m)   Wt 88.5 kg   SpO2 92% Comment: Patient placed on O2 2L/min  via [place] and sats increased to 93  BMI 30.54 kg/m  Gen:   Awake, no distress   Resp:  Normal effort  MSK:   Moves extremities without difficulty  Other:    Medical Decision Making  Medically screening exam initiated at 5:16 PM.  Appropriate orders placed.  [name] was informed that the remainder of the evaluation will be completed by another provider, this initial triage assessment does not replace that evaluation, and the importance of remaining in the ED until their evaluation is complete.  Oxygen noted to be 89-90%.  Tachycardic in triage.  Nauseous     Kimberly Hammock, PA-C 01/06/21 1718    Kimberly Gambler, MD 01/08/21 1452

## 2021-01-06 NOTE — ED Notes (Signed)
Patient transported to CT 

## 2021-01-06 NOTE — ED Triage Notes (Addendum)
Patient reports that she has been having nausea, but no vomiting since yesterday. Patient last chemo 6 days ago.   Patient states her temperature at home was 100.0  Triage temperature 98.7.  Patient also c/o dysuria x 2 days.

## 2021-01-06 NOTE — Telephone Encounter (Signed)
Pt left VM which had very incongruent connection - of message.  This RN could make out - " UTI symptoms and yeast " ...  This RN attempted to return call call - and obtained VM- detailed message requesting a return call asap for hopefully person to person connection - this RN also stated if she needed to come in to do so before 4 pm today and ask for this RN so symptoms can be discussed.

## 2021-01-06 NOTE — ED Provider Notes (Signed)
[facility] DEPT Provider Note   CSN: [id number] Arrival date & time: 01/06/21  1649     History Chief Complaint  Patient presents with   cancer patient   Emesis   Dysuria   Fever    [name] is a 65 y.o. female history of diabetes, hyperlipidemia, hypertension, breast cancer on chemotherapy here presenting with fever and shortness of breath.  Patient's last chemo was last Thursday.  Patient has low-grade temperature 100.5 today.  She also has some subjective shortness of breath.  Denies any trouble urinating. Denies any abdominal pain.  Patient was noted to be hypoxic with oxygen level of 88% on room air and is not normally on oxygen.  Denies any sick contacts.  The history is provided by the patient.      Past Medical History:  Diagnosis Date   Cancer ([place])    Cataract    removed   Diabetes mellitus without complication ([place])    History of kidney stones    Hyperlipidemia    Hypertension    PONV (postoperative nausea and vomiting)     Patient Active Problem List   Diagnosis Date Noted   Malignant neoplasm of upper-inner quadrant of right breast in female, estrogen receptor positive ([place]) 10/14/2020    Past Surgical History:  Procedure Laterality Date   ABDOMINAL HYSTERECTOMY     BREAST LUMPECTOMY WITH RADIOACTIVE SEED AND SENTINEL LYMPH NODE BIOPSY Right 11/05/2020   Procedure: RIGHT BREAST LUMPECTOMY WITH RADIOACTIVE SEED AND RIGHT AXILLARY SENTINEL LYMPH NODE BIOPSY;  Surgeon: Rolm Bookbinder, MD;  Location: [facility];  Service: General;  Laterality: Right;   CHOLECYSTECTOMY     KNEE SURGERY     PORTACATH PLACEMENT Right 12/30/2020   Procedure: INSERTION PORT-A-CATH;  Surgeon: Rolm Bookbinder, MD;  Location: [place];  Service: General;  Laterality: Right;     OB History   No obstetric history on file.     Family History  Problem Relation Age of Onset   Heart attack Mother    Diabetes Father    Colon  cancer Maternal Grandmother    Breast cancer Cousin        dx early 28's    Social History   Tobacco Use   Smoking status: Former    Packs/day: 0.50    Years: 10.00    Pack years: 5.00    Types: Cigarettes    Quit date: 09/25/1981    Years since quitting: 39.3   Smokeless tobacco: Never  Vaping Use   Vaping Use: Never used  Substance Use Topics   Alcohol use: Never   Drug use: Never    Home Medications Prior to Admission medications   Medication Sig Start Date End Date Taking? Authorizing Provider  ACCU-CHEK GUIDE test strip  07/23/20   [provider]  acetaminophen (TYLENOL) 500 MG tablet Take 1,000 mg by mouth every 6 (six) hours as needed for moderate pain.    [provider]  amitriptyline (ELAVIL) 25 MG tablet Take 25 mg by mouth at bedtime.    [provider]  Blood Glucose Monitoring Suppl (ACCU-CHEK GUIDE ME) w/Device KIT  07/23/20   [provider]  cetirizine (ZYRTEC) 10 MG tablet Take 10 mg by mouth at bedtime.    [provider]  Cholecalciferol (DIALYVITE VITAMIN D 5000) 125 MCG (5000 UT) capsule Take 5,000 Units by mouth daily.    [provider]  dexamethasone (DECADRON) 4 MG tablet Take 2 tablets (8  mg total) by mouth 2 (two) times daily. Start the day before Taxotere. Then again the day after chemo for 3 days. 12/11/20   Magrinat, Virgie Dad, MD  ibuprofen (ADVIL) 200 MG tablet Take 400 mg by mouth every 6 (six) hours as needed for moderate pain or headache.    [provider]  Insulin Syringe-Needle U-100 (INSULIN SYRINGE .5CC/30GX5/16") 30G X 5/16" 0.5 ML MISC See admin instructions. 04/23/20   [provider]  lidocaine-prilocaine (EMLA) cream Apply to affected area once Patient not taking: Reported on 12/23/2020 12/11/20   Magrinat, Virgie Dad, MD  LORazepam (ATIVAN) 0.5 MG tablet Take 1 tablet (0.5 mg total) by mouth at bedtime as needed (Nausea or vomiting). 12/11/20   Magrinat, Virgie Dad, MD   losartan (COZAAR) 25 MG tablet Take 25 mg by mouth daily.    [provider]  metFORMIN (GLUCOPHAGE) 500 MG tablet Take 1,000 mg by mouth 2 (two) times daily.    [provider]  Multiple Vitamin (MULTIVITAMIN WITH MINERALS) TABS tablet Take 1 tablet by mouth daily.    [provider]  Probiotic Product (PROBIOTIC PO) Take 1 capsule by mouth daily.    [provider]  prochlorperazine (COMPAZINE) 10 MG tablet Take 1 tablet (10 mg total) by mouth every 6 (six) hours as needed (Nausea or vomiting). 12/11/20   Magrinat, Virgie Dad, MD  sertraline (ZOLOFT) 50 MG tablet Take 50 mg by mouth daily.    [provider]  simvastatin (ZOCOR) 40 MG tablet Take 40 mg by mouth at bedtime.    [provider]  TOUJEO SOLOSTAR 300 UNIT/ML Solostar Pen Inject 45 Units into the skin daily. 07/22/20   [provider]    Allergies    Glimepiride  Review of Systems   Review of Systems  Constitutional:  Positive for chills and fever.  Respiratory:  Positive for shortness of breath.   All other systems reviewed and are negative.  Physical Exam Updated Vital Signs BP 126/65   Pulse (!) 106   Temp 99.8 F (37.7 C) (Oral)   Resp (!) 22   Ht _0  (1.702 m)   Wt 88.5 kg   SpO2 96%   BMI 30.54 kg/m   Physical Exam Vitals and nursing note reviewed.  Constitutional:      Comments: Chronically ill-appearing  HENT:     Head: Normocephalic.     Nose: Nose normal.     Mouth/Throat:     Mouth: Mucous membranes are dry.  Eyes:     Extraocular Movements: Extraocular movements intact.     Pupils: Pupils are equal, round, and reactive to light.  Cardiovascular:     Rate and Rhythm: Regular rhythm. Tachycardia present.     Pulses: Normal pulses.  Pulmonary:     Comments: Diminished bilaterally Abdominal:     General: Abdomen is flat.     Palpations: Abdomen is soft.  Musculoskeletal:        General: Normal range of motion.     Cervical back:  Normal range of motion and neck supple.  Skin:    General: Skin is warm.     Capillary Refill: Capillary refill takes less than 2 seconds.  Neurological:     General: No focal deficit present.  Psychiatric:        Mood and Affect: Mood normal.        Behavior: Behavior normal.    ED Results / Procedures / Treatments   Labs (all labs ordered  are listed, but only abnormal results are displayed) Labs Reviewed  COMPREHENSIVE METABOLIC PANEL - Abnormal; Notable for the following components:      Result Value   Sodium 128 (*)    Chloride 94 (*)    Glucose, Bld 301 (*)    Calcium 8.6 (*)    All other components within normal limits  CBC - Abnormal; Notable for the following components:   nRBC 0.6 (*)    All other components within normal limits  LACTIC ACID, PLASMA - Abnormal; Notable for the following components:   Lactic Acid, Venous 2.1 (*)    All other components within normal limits  BLOOD GAS, VENOUS - Abnormal; Notable for the following components:   pCO2, Ven 42.1 (*)    pO2, Ven 51.6 (*)    All other components within normal limits  RESP PANEL BY RT-PCR (FLU A&B, COVID) ARPGX2  CULTURE, BLOOD (ROUTINE X 2)  CULTURE, BLOOD (ROUTINE X 2)  URINE CULTURE  LIPASE, BLOOD  URINALYSIS, ROUTINE W REFLEX MICROSCOPIC  LACTIC ACID, PLASMA    EKG None  Radiology CT Angio Chest PE W and/or Wo Contrast  Result Date: 01/06/2021 CLINICAL DATA:  Shortness of breath. Abdominal infection, fever and pain. EXAM: CT ANGIOGRAPHY CHEST CT ABDOMEN AND PELVIS WITH CONTRAST TECHNIQUE: Multidetector CT imaging of the chest was performed using the standard protocol during bolus administration of intravenous contrast. Multiplanar CT image reconstructions and MIPs were obtained to evaluate the vascular anatomy. Multidetector CT imaging of the abdomen and pelvis was performed using the standard protocol during bolus administration of intravenous contrast. CONTRAST:  66m OMNIPAQUE IOHEXOL 350 MG/ML  SOLN COMPARISON:  None. FINDINGS: CTA CHEST FINDINGS Cardiovascular: Satisfactory opacification of the pulmonary arteries to the segmental level. No evidence of pulmonary embolism. The heart is borderline enlarged. There is no pericardial effusion. Aorta is normal in size. There are atherosclerotic calcifications of the aorta. Chest port catheter tip ends in the SVC. Mediastinum/Nodes: No enlarged mediastinal, hilar, or axillary lymph nodes. Thyroid gland, trachea, and esophagus demonstrate no significant findings. Lungs/Pleura: There is mild atelectasis in the bilateral lower lobes. The lungs otherwise appear clear. There is no pleural effusion or pneumothorax. Trachea and central airways are patent. Musculoskeletal: No acute fracture. Right sided chest port is present. There is marked subcutaneous edema in the anterior right chest wall surrounding the port. There is some soft tissue air in the lower right neck, likely related to port placement, although infection is not excluded. There are 2 fluid collections within the right breast with mild surrounding stranding. One is located in the lateral right breast measuring 5.1 by 3.1 by 4.8 cm. The other is located in the superior central breast measuring 4.2 by 1.5 by 2.5 cm. Review of the MIP images confirms the above findings. CT ABDOMEN and PELVIS FINDINGS Hepatobiliary: The gallbladder surgically absent. No focal liver lesions are identified. There is no biliary ductal dilatation. Pancreas: There is a fat attenuation lesion in the neck of the pancreas measuring 10 x 17 mm compatible with lipoma. Pancreas otherwise appears within normal limits. Spleen: Normal in size without focal abnormality. Adrenals/Urinary Tract: Adrenal glands are unremarkable. Kidneys are normal, without renal calculi, focal lesion, or hydronephrosis. Bladder is unremarkable. Stomach/Bowel: There is no evidence for bowel obstruction or free air. The appendix is within normal limits. There is  sigmoid colon diverticulosis. There is some mild inflammatory stranding surrounding diverticula in the sigmoid colon compatible with acute diverticulitis. There is no evidence for perforation or  abscess. Small bowel and stomach are within normal limits. Vascular/Lymphatic: Aortic atherosclerosis. No enlarged abdominal or pelvic lymph nodes. Reproductive: Status post hysterectomy. No adnexal masses. Other: No abdominal wall hernia or abnormality. No abdominopelvic ascites. Musculoskeletal: Degenerative changes affect the spine. Review of the MIP images confirms the above findings. IMPRESSION: 1. No evidence for pulmonary embolism. 2. Right chest port in place. There is significant subcutaneous stranding surrounding the right chest port with some soft tissue air in the lower right neck. Findings may be related to recent port placement; however, soft tissue infection is not excluded. 3. There are 2 right breast fluid collections, indeterminate. The largest measures up to 5.1 cm. 4. Acute uncomplicated sigmoid colon diverticulitis. 5. Pancreatic lipoma. Electronically Signed   By: Ronney Asters M.D.   On: 01/06/2021 20:58   CT ABDOMEN PELVIS W CONTRAST  Result Date: 01/06/2021 CLINICAL DATA:  Shortness of breath. Abdominal infection, fever and pain. EXAM: CT ANGIOGRAPHY CHEST CT ABDOMEN AND PELVIS WITH CONTRAST TECHNIQUE: Multidetector CT imaging of the chest was performed using the standard protocol during bolus administration of intravenous contrast. Multiplanar CT image reconstructions and MIPs were obtained to evaluate the vascular anatomy. Multidetector CT imaging of the abdomen and pelvis was performed using the standard protocol during bolus administration of intravenous contrast. CONTRAST:  52m OMNIPAQUE IOHEXOL 350 MG/ML SOLN COMPARISON:  None. FINDINGS: CTA CHEST FINDINGS Cardiovascular: Satisfactory opacification of the pulmonary arteries to the segmental level. No evidence of pulmonary embolism. The  heart is borderline enlarged. There is no pericardial effusion. Aorta is normal in size. There are atherosclerotic calcifications of the aorta. Chest port catheter tip ends in the SVC. Mediastinum/Nodes: No enlarged mediastinal, hilar, or axillary lymph nodes. Thyroid gland, trachea, and esophagus demonstrate no significant findings. Lungs/Pleura: There is mild atelectasis in the bilateral lower lobes. The lungs otherwise appear clear. There is no pleural effusion or pneumothorax. Trachea and central airways are patent. Musculoskeletal: No acute fracture. Right sided chest port is present. There is marked subcutaneous edema in the anterior right chest wall surrounding the port. There is some soft tissue air in the lower right neck, likely related to port placement, although infection is not excluded. There are 2 fluid collections within the right breast with mild surrounding stranding. One is located in the lateral right breast measuring 5.1 by 3.1 by 4.8 cm. The other is located in the superior central breast measuring 4.2 by 1.5 by 2.5 cm. Review of the MIP images confirms the above findings. CT ABDOMEN and PELVIS FINDINGS Hepatobiliary: The gallbladder surgically absent. No focal liver lesions are identified. There is no biliary ductal dilatation. Pancreas: There is a fat attenuation lesion in the neck of the pancreas measuring 10 x 17 mm compatible with lipoma. Pancreas otherwise appears within normal limits. Spleen: Normal in size without focal abnormality. Adrenals/Urinary Tract: Adrenal glands are unremarkable. Kidneys are normal, without renal calculi, focal lesion, or hydronephrosis. Bladder is unremarkable. Stomach/Bowel: There is no evidence for bowel obstruction or free air. The appendix is within normal limits. There is sigmoid colon diverticulosis. There is some mild inflammatory stranding surrounding diverticula in the sigmoid colon compatible with acute diverticulitis. There is no evidence for  perforation or abscess. Small bowel and stomach are within normal limits. Vascular/Lymphatic: Aortic atherosclerosis. No enlarged abdominal or pelvic lymph nodes. Reproductive: Status post hysterectomy. No adnexal masses. Other: No abdominal wall hernia or abnormality. No abdominopelvic ascites. Musculoskeletal: Degenerative changes affect the spine. Review of the MIP images confirms the above findings.  IMPRESSION: 1. No evidence for pulmonary embolism. 2. Right chest port in place. There is significant subcutaneous stranding surrounding the right chest port with some soft tissue air in the lower right neck. Findings may be related to recent port placement; however, soft tissue infection is not excluded. 3. There are 2 right breast fluid collections, indeterminate. The largest measures up to 5.1 cm. 4. Acute uncomplicated sigmoid colon diverticulitis. 5. Pancreatic lipoma. Electronically Signed   By: Ronney Asters M.D.   On: 01/06/2021 20:58   DG Chest Port 1 View  Result Date: 01/06/2021 CLINICAL DATA:  Hypoxia and fevers EXAM: PORTABLE CHEST 1 VIEW COMPARISON:  12/30/2020 FINDINGS: Stable right-sided chest wall port is noted with catheter tip in the mid superior vena cava. No pneumothorax is noted. Cardiac shadow is within normal limits. The lungs are well aerated without focal infiltrate or effusion. No bony abnormality is noted. IMPRESSION: No acute abnormality seen. Electronically Signed   By: Inez Catalina M.D.   On: 01/06/2021 19:26    Procedures Procedures   CRITICAL CARE Performed by: Wandra Arthurs   Total critical care time: 30  minutes  Critical care time was exclusive of separately billable procedures and treating other patients.  Critical care was necessary to treat or prevent imminent or life-threatening deterioration.  Critical care was time spent personally by me on the following activities: development of treatment plan with patient and/or surrogate as well as nursing, discussions  with consultants, evaluation of patient's response to treatment, examination of patient, obtaining history from patient or surrogate, ordering and performing treatments and interventions, ordering and review of laboratory studies, ordering and review of radiographic studies, pulse oximetry and re-evaluation of patient's condition.   Medications Ordered in ED Medications  vancomycin (VANCOCIN) IVPB 1000 mg/200 mL premix (1,000 mg Intravenous New Bag/Given 01/06/21 2142)  ondansetron (ZOFRAN-ODT) disintegrating tablet 4 mg (4 mg Oral Given 01/06/21 1745)  sodium chloride 0.9 % bolus 1,000 mL (1,000 mLs Intravenous New Bag/Given 01/06/21 1907)  acetaminophen (TYLENOL) tablet 1,000 mg (1,000 mg Oral Given 01/06/21 1829)  ceFEPIme (MAXIPIME) 2 g in sodium chloride 0.9 % 100 mL IVPB (0 g Intravenous Stopped 01/06/21 2054)  iohexol (OMNIPAQUE) 350 MG/ML injection 80 mL (80 mLs Intravenous Contrast Given 01/06/21 2024)    ED Course  I have reviewed the triage vital signs and the nursing notes.  Pertinent labs & imaging results that were available during my care of the patient were reviewed by me and considered in my medical decision making (see chart for details).    MDM Rules/Calculators/A&P                           JORDANN GRIME is a 65 y.o. female here presenting with hypoxia and fever.  Consider COVID versus pneumonia versus PE.  She recently had chemotherapy so consider neutropenic fever.  Patient is febrile and tachycardic and hypoxic. Will do sepsis work-up with CBC and CMP and lactate and cultures and chest x-ray.  If chest x-ray is clear she may need a CTA chest to rule out PE.  9:47 PM White blood cell count is normal.  Patient is not neutropenic.  Lactate is 2.  Patient keeps on dropping her sats to the 80s.  Her COVID test is negative.  Her CT did not show PE or pneumonia.  Of note, patient does have some air around the port and possible cellulitis.  Since this is recent postop, I  consulted Dr. Tera Helper.  He states that Dr. Donne Hazel will be in tomorrow and will see patient.  Patient already received broad-spectrum antibiotics.  Patient does have diverticulitis on CT.  She does have some abdominal pain and diarrhea.  At this point, hospitalist to admit for hypoxia, diverticulitis, possible port infection  Final Clinical Impression(s) / ED Diagnoses Final diagnoses:  None    Rx / DC Orders ED Discharge Orders     None        Drenda Freeze, MD 01/06/21 2151

## 2021-01-06 NOTE — ED Notes (Signed)
Pt's O2 sats dropped to mid 80s.  O2 increased to 4L [place] to return to 92%

## 2021-01-06 NOTE — ED Notes (Signed)
Pt placed back on [place] at 2 liters after sats dropped to 88 on RA

## 2021-01-06 NOTE — Telephone Encounter (Signed)
This RN attempted to call patient again - with reaching her VM. Second message left.   This RN then called the patient's husband and was able to speak with him though his phone connection too was very incongruent but he was able to hear this RN well enough to get to an area that allowed this RN to speak with [name].'  Per inquiry [name] states her fever is greater then 100.5 with UTI symptoms.  This RN informed due to recent chemo last week and concern for her immune system being compromised as well as presently now 4 pm she needs to proceed to the ER directly.  This RN asked which ER she would go to with pt stating she would go to the [facility] ER.  This RN placed a called to the charge nurse at [facility] ER and gave information of pending arrival of chemotherapy patient with high suspension for sepsis due to treatment last week.

## 2021-01-06 NOTE — ED Notes (Signed)
IV team at bedside to evaluate port

## 2021-01-07 ENCOUNTER — Ambulatory Visit: Payer: Medicare HMO

## 2021-01-07 ENCOUNTER — Encounter (HOSPITAL_COMMUNITY): Payer: Self-pay | Admitting: Internal Medicine

## 2021-01-07 ENCOUNTER — Ambulatory Visit: Payer: Medicare HMO | Admitting: Adult Health

## 2021-01-07 ENCOUNTER — Inpatient Hospital Stay (HOSPITAL_COMMUNITY): Payer: Medicare HMO

## 2021-01-07 ENCOUNTER — Other Ambulatory Visit: Payer: Medicare HMO

## 2021-01-07 DIAGNOSIS — R300 Dysuria: Secondary | ICD-10-CM

## 2021-01-07 DIAGNOSIS — R1110 Vomiting, unspecified: Secondary | ICD-10-CM

## 2021-01-07 DIAGNOSIS — R6510 Systemic inflammatory response syndrome (SIRS) of non-infectious origin without acute organ dysfunction: Secondary | ICD-10-CM | POA: Diagnosis not present

## 2021-01-07 DIAGNOSIS — C50919 Malignant neoplasm of unspecified site of unspecified female breast: Secondary | ICD-10-CM

## 2021-01-07 DIAGNOSIS — R509 Fever, unspecified: Secondary | ICD-10-CM

## 2021-01-07 DIAGNOSIS — Z170 Estrogen receptor positive status [ER+]: Secondary | ICD-10-CM

## 2021-01-07 DIAGNOSIS — C50211 Malignant neoplasm of upper-inner quadrant of right female breast: Secondary | ICD-10-CM

## 2021-01-07 DIAGNOSIS — K5792 Diverticulitis of intestine, part unspecified, without perforation or abscess without bleeding: Secondary | ICD-10-CM

## 2021-01-07 DIAGNOSIS — E1165 Type 2 diabetes mellitus with hyperglycemia: Secondary | ICD-10-CM

## 2021-01-07 DIAGNOSIS — R109 Unspecified abdominal pain: Secondary | ICD-10-CM

## 2021-01-07 DIAGNOSIS — N61 Mastitis without abscess: Secondary | ICD-10-CM

## 2021-01-07 DIAGNOSIS — E785 Hyperlipidemia, unspecified: Secondary | ICD-10-CM

## 2021-01-07 LAB — COMPREHENSIVE METABOLIC PANEL
ALT: 25 U/L (ref 0–44)
AST: 21 U/L (ref 15–41)
Albumin: 3.1 g/dL — ABNORMAL LOW (ref 3.5–5.0)
Alkaline Phosphatase: 70 U/L (ref 38–126)
Anion gap: 5 (ref 5–15)
BUN: 10 mg/dL (ref 8–23)
CO2: 27 mmol/L (ref 22–32)
Calcium: 8 mg/dL — ABNORMAL LOW (ref 8.9–10.3)
Chloride: 99 mmol/L (ref 98–111)
Creatinine, Ser: 0.58 mg/dL (ref 0.44–1.00)
GFR, Estimated: >60 mL/min (ref >60)
Glucose, Bld: 227 mg/dL — ABNORMAL HIGH (ref 70–99)
Potassium: 3.3 mmol/L — ABNORMAL LOW (ref 3.5–5.1)
Sodium: 131 mmol/L — ABNORMAL LOW (ref 135–145)
Total Bilirubin: 0.6 mg/dL (ref 0.3–1.2)
Total Protein: 6.2 g/dL — ABNORMAL LOW (ref 6.5–8.1)

## 2021-01-07 LAB — CBC
HCT: 36.4 % (ref 36.0–46.0)
Hemoglobin: 11.8 g/dL — ABNORMAL LOW (ref 12.0–15.0)
MCH: 29.6 pg (ref 26.0–34.0)
MCHC: 32.4 g/dL (ref 30.0–36.0)
MCV: 91.5 fL (ref 80.0–100.0)
Platelets: 230 10*3/uL (ref 150–400)
RBC: 3.98 MIL/uL (ref 3.87–5.11)
RDW: 13.3 % (ref 11.5–15.5)
WBC: 10.3 10*3/uL (ref 4.0–10.5)
nRBC: 0.7 % — ABNORMAL HIGH (ref 0.0–0.2)

## 2021-01-07 LAB — CBC WITH DIFFERENTIAL/PLATELET
Abs Immature Granulocytes: 0.77 10*3/uL — ABNORMAL HIGH (ref 0.00–0.07)
Basophils Absolute: 0 10*3/uL (ref 0.0–0.1)
Basophils Relative: 0 %
Eosinophils Absolute: 0 10*3/uL (ref 0.0–0.5)
Eosinophils Relative: 0 %
HCT: 32.4 % — ABNORMAL LOW (ref 36.0–46.0)
Hemoglobin: 10.7 g/dL — ABNORMAL LOW (ref 12.0–15.0)
Immature Granulocytes: 8 %
Lymphocytes Relative: 18 %
Lymphs Abs: 1.8 10*3/uL (ref 0.7–4.0)
MCH: 29.4 pg (ref 26.0–34.0)
MCHC: 33 g/dL (ref 30.0–36.0)
MCV: 89 fL (ref 80.0–100.0)
Monocytes Absolute: 1.7 10*3/uL — ABNORMAL HIGH (ref 0.1–1.0)
Monocytes Relative: 17 %
Neutro Abs: 5.8 10*3/uL (ref 1.7–7.7)
Neutrophils Relative %: 57 %
Platelets: 209 10*3/uL (ref 150–400)
RBC: 3.64 MIL/uL — ABNORMAL LOW (ref 3.87–5.11)
RDW: 13.3 % (ref 11.5–15.5)
WBC Morphology: ABNORMAL
WBC: 10.1 10*3/uL (ref 4.0–10.5)
nRBC: 0.7 % — ABNORMAL HIGH (ref 0.0–0.2)

## 2021-01-07 LAB — GLUCOSE, CAPILLARY
Glucose-Capillary: 203 mg/dL — ABNORMAL HIGH (ref 70–99)
Glucose-Capillary: 205 mg/dL — ABNORMAL HIGH (ref 70–99)
Glucose-Capillary: 274 mg/dL — ABNORMAL HIGH (ref 70–99)
Glucose-Capillary: 274 mg/dL — ABNORMAL HIGH (ref 70–99)
Glucose-Capillary: 295 mg/dL — ABNORMAL HIGH (ref 70–99)
Glucose-Capillary: 323 mg/dL — ABNORMAL HIGH (ref 70–99)

## 2021-01-07 LAB — CREATININE, SERUM
Creatinine, Ser: 0.79 mg/dL (ref 0.44–1.00)
GFR, Estimated: >60 mL/min (ref >60)

## 2021-01-07 LAB — HIV ANTIBODY (ROUTINE TESTING W REFLEX): HIV Screen 4th Generation wRfx: NONREACTIVE

## 2021-01-07 LAB — MAGNESIUM: Magnesium: 1.5 mg/dL — ABNORMAL LOW (ref 1.7–2.4)

## 2021-01-07 MED ORDER — INSULIN ASPART 100 UNIT/ML IJ SOLN: 0.0000 [IU] | Freq: Three times a day (TID) | INTRAMUSCULAR | Status: DC

## 2021-01-07 MED ORDER — CHOLECALCIFEROL 125 MCG (5000 UT) PO CAPS: 5000.0000 [IU] | ORAL_CAPSULE | Freq: Every day | ORAL | Status: DC

## 2021-01-07 MED ORDER — VANCOMYCIN HCL IN DEXTROSE 1-5 GM/200ML-% IV SOLN: 1000.0000 mg | Freq: Once | INTRAVENOUS | Status: DC

## 2021-01-07 MED ORDER — SERTRALINE HCL 50 MG PO TABS
50.0000 mg | ORAL_TABLET | Freq: Every day | ORAL | Status: DC
  Administered 2021-01-07 – 2021-01-13 (×7): 50 mg via ORAL
  Filled 2021-01-07 (×7): qty 1

## 2021-01-07 MED ORDER — LACTATED RINGERS IV SOLN: INTRAVENOUS | Status: AC

## 2021-01-07 MED ORDER — INSULIN GLARGINE-YFGN 100 UNIT/ML ~~LOC~~ SOLN
45.0000 [IU] | Freq: Every day | SUBCUTANEOUS | Status: DC
  Administered 2021-01-07 – 2021-01-13 (×7): 45 [IU] via SUBCUTANEOUS
  Filled 2021-01-07 (×7): qty 0.45

## 2021-01-07 MED ORDER — LORATADINE 10 MG PO TABS
10.0000 mg | ORAL_TABLET | Freq: Every day | ORAL | Status: DC
Start: 2021-01-07 — End: 2021-01-13
  Administered 2021-01-07 – 2021-01-13 (×7): 10 mg via ORAL
  Filled 2021-01-07 (×7): qty 1

## 2021-01-07 MED ORDER — ZOLPIDEM TARTRATE 5 MG PO TABS: 5.0000 mg | ORAL_TABLET | Freq: Once | ORAL | Status: DC

## 2021-01-07 MED ORDER — SIMVASTATIN 40 MG PO TABS
40.0000 mg | ORAL_TABLET | Freq: Every day | ORAL | Status: DC
  Administered 2021-01-07 – 2021-01-12 (×7): 40 mg via ORAL
  Filled 2021-01-07: qty 1
  Filled 2021-01-07 (×2): qty 2
  Filled 2021-01-07 (×2): qty 1
  Filled 2021-01-07: qty 2
  Filled 2021-01-07: qty 1

## 2021-01-07 MED ORDER — LOSARTAN POTASSIUM 50 MG PO TABS
25.0000 mg | ORAL_TABLET | Freq: Every day | ORAL | Status: DC
  Administered 2021-01-07 – 2021-01-13 (×7): 25 mg via ORAL
  Filled 2021-01-07 (×7): qty 1

## 2021-01-07 MED ORDER — INSULIN ASPART 100 UNIT/ML IJ SOLN
0.0000 [IU] | Freq: Three times a day (TID) | INTRAMUSCULAR | Status: DC
  Administered 2021-01-07 (×2): 5 [IU] via SUBCUTANEOUS
  Administered 2021-01-07: 3 [IU] via SUBCUTANEOUS

## 2021-01-07 MED ORDER — TRAZODONE HCL 50 MG PO TABS
50.0000 mg | ORAL_TABLET | ORAL | Status: AC | PRN
  Administered 2021-01-07: 50 mg via ORAL
  Filled 2021-01-07: qty 1

## 2021-01-07 MED ORDER — INSULIN ASPART 100 UNIT/ML IJ SOLN
4.0000 [IU] | Freq: Three times a day (TID) | INTRAMUSCULAR | Status: DC
  Administered 2021-01-08 – 2021-01-13 (×14): 4 [IU] via SUBCUTANEOUS

## 2021-01-07 MED ORDER — TRAZODONE HCL 50 MG PO TABS
25.0000 mg | ORAL_TABLET | ORAL | Status: AC | PRN
  Administered 2021-01-07: 25 mg via ORAL
  Filled 2021-01-07: qty 1

## 2021-01-07 MED ORDER — INSULIN ASPART 100 UNIT/ML IJ SOLN
0.0000 [IU] | Freq: Three times a day (TID) | INTRAMUSCULAR | Status: DC
  Administered 2021-01-07: 5 [IU] via SUBCUTANEOUS
  Administered 2021-01-08 (×2): 2 [IU] via SUBCUTANEOUS
  Administered 2021-01-08: 7 [IU] via SUBCUTANEOUS
  Administered 2021-01-08: 3 [IU] via SUBCUTANEOUS
  Administered 2021-01-09 (×2): 1 [IU] via SUBCUTANEOUS
  Administered 2021-01-10: 5 [IU] via SUBCUTANEOUS
  Administered 2021-01-10: 1 [IU] via SUBCUTANEOUS
  Administered 2021-01-10: 2 [IU] via SUBCUTANEOUS
  Administered 2021-01-11: 3 [IU] via SUBCUTANEOUS
  Administered 2021-01-11 – 2021-01-12 (×3): 2 [IU] via SUBCUTANEOUS
  Administered 2021-01-12: 5 [IU] via SUBCUTANEOUS
  Administered 2021-01-13: 2 [IU] via SUBCUTANEOUS

## 2021-01-07 MED ORDER — SODIUM CHLORIDE 0.9 % IV SOLN: 2.0000 g | Freq: Once | INTRAVENOUS | Status: DC

## 2021-01-07 MED ORDER — SODIUM CHLORIDE 0.9% FLUSH
10.0000 mL | INTRAVENOUS | Status: DC | PRN
  Administered 2021-01-07: 10 mL

## 2021-01-07 MED ORDER — METRONIDAZOLE 500 MG/100ML IV SOLN
500.0000 mg | Freq: Two times a day (BID) | INTRAVENOUS | Status: DC
Start: 2021-01-07 — End: 2021-01-12
  Administered 2021-01-07 – 2021-01-11 (×11): 500 mg via INTRAVENOUS
  Filled 2021-01-07 (×11): qty 100

## 2021-01-07 MED ORDER — LIDOCAINE HCL 1 % IJ SOLN
INTRAMUSCULAR | Status: AC
  Filled 2021-01-07: qty 20

## 2021-01-07 MED ORDER — VANCOMYCIN HCL 1250 MG/250ML IV SOLN
1250.0000 mg | INTRAVENOUS | Status: DC
  Administered 2021-01-07: 1250 mg via INTRAVENOUS
  Filled 2021-01-07: qty 250

## 2021-01-07 MED ORDER — SODIUM CHLORIDE 0.9 % IV SOLN
2.0000 g | Freq: Three times a day (TID) | INTRAVENOUS | Status: DC
  Administered 2021-01-07 – 2021-01-08 (×4): 2 g via INTRAVENOUS
  Filled 2021-01-07 (×5): qty 2

## 2021-01-07 MED ORDER — ADULT MULTIVITAMIN W/MINERALS CH
1.0000 | ORAL_TABLET | Freq: Every day | ORAL | Status: DC
  Administered 2021-01-07 – 2021-01-13 (×7): 1 via ORAL
  Filled 2021-01-07 (×7): qty 1

## 2021-01-07 MED ORDER — ACETAMINOPHEN 325 MG PO TABS
650.0000 mg | ORAL_TABLET | Freq: Four times a day (QID) | ORAL | Status: DC | PRN
  Administered 2021-01-07 – 2021-01-12 (×8): 650 mg via ORAL
  Filled 2021-01-07 (×8): qty 2

## 2021-01-07 MED ORDER — ACETAMINOPHEN 650 MG RE SUPP: 650.0000 mg | Freq: Four times a day (QID) | RECTAL | Status: DC | PRN

## 2021-01-07 MED ORDER — PROCHLORPERAZINE MALEATE 10 MG PO TABS: 10.0000 mg | ORAL_TABLET | Freq: Four times a day (QID) | ORAL | Status: DC | PRN

## 2021-01-07 MED ORDER — INSULIN ASPART 100 UNIT/ML IJ SOLN
5.0000 [IU] | Freq: Once | INTRAMUSCULAR | Status: AC
  Administered 2021-01-07: 5 [IU] via SUBCUTANEOUS

## 2021-01-07 MED ORDER — MAGNESIUM SULFATE 4 GM/100ML IV SOLN
4.0000 g | Freq: Once | INTRAVENOUS | Status: AC
  Administered 2021-01-07: 4 g via INTRAVENOUS
  Filled 2021-01-07: qty 100

## 2021-01-07 MED ORDER — HEPARIN SODIUM (PORCINE) 5000 UNIT/ML IJ SOLN
5000.0000 [IU] | Freq: Three times a day (TID) | INTRAMUSCULAR | Status: DC
  Administered 2021-01-07 – 2021-01-13 (×18): 5000 [IU] via SUBCUTANEOUS
  Filled 2021-01-07 (×18): qty 1

## 2021-01-07 MED ORDER — POTASSIUM CHLORIDE CRYS ER 10 MEQ PO TBCR
40.0000 meq | EXTENDED_RELEASE_TABLET | Freq: Once | ORAL | Status: AC
  Administered 2021-01-07: 40 meq via ORAL
  Filled 2021-01-07: qty 4

## 2021-01-07 MED ORDER — LORAZEPAM 0.5 MG PO TABS: 0.5000 mg | ORAL_TABLET | Freq: Every evening | ORAL | Status: DC | PRN

## 2021-01-07 MED ORDER — CHLORHEXIDINE GLUCONATE CLOTH 2 % EX PADS
6.0000 | MEDICATED_PAD | Freq: Every day | CUTANEOUS | Status: DC
  Administered 2021-01-07 – 2021-01-13 (×7): 6 via TOPICAL

## 2021-01-07 MED ORDER — ENSURE MAX PROTEIN PO LIQD
11.0000 [oz_av] | Freq: Every day | ORAL | Status: DC
  Administered 2021-01-07 – 2021-01-13 (×5): 11 [oz_av] via ORAL
  Filled 2021-01-07 (×7): qty 330

## 2021-01-07 MED ORDER — AMITRIPTYLINE HCL 25 MG PO TABS
25.0000 mg | ORAL_TABLET | Freq: Every day | ORAL | Status: DC
  Administered 2021-01-07 – 2021-01-12 (×7): 25 mg via ORAL
  Filled 2021-01-07 (×7): qty 1

## 2021-01-07 MED ORDER — VITAMIN D 25 MCG (1000 UNIT) PO TABS
5000.0000 [IU] | ORAL_TABLET | Freq: Every day | ORAL | Status: DC
  Administered 2021-01-07 – 2021-01-13 (×7): 5000 [IU] via ORAL
  Filled 2021-01-07 (×7): qty 5

## 2021-01-07 NOTE — Progress Notes (Signed)
Pharmacy Antibiotic Note  [name] is a 65 y.o. female admitted on 01/06/2021 with history of diabetes, hyperlipidemia, hypertension, breast cancer on chemotherapy here presenting with fever and shortness of breath.  Pharmacy has been consulted to dose vancomycin and cefepime for sepsis.   1st doses given in ED  Plan: Vancomycin 1250mg  IV q24h (AUC 463.2, Scr 0.8) Cefepime 2gm IV q8h Follow renal function, cultures and clinical course  Height: 5\' 7"  (170.2 cm) Weight: 88.5 kg (195 lb) IBW/kg (Calculated) : 61.6  Temp (24hrs), Avg:100.4 F (38 C), Min:98.7 F (37.1 C), Max:102.8 F (39.3 C)  Recent Labs  Lab 12/31/20 0750 01/06/21 1840  WBC 7.6 6.9  CREATININE 0.76 0.61  LATICACIDVEN  --  2.1*    Estimated Creatinine Clearance: 80.1 mL/min (by C-G formula based on SCr of 0.61 mg/dL).    Allergies  Allergen Reactions   Glimepiride     Hypoglycemia     Antimicrobials this admission: 10/17 vanc >> 10/17 cefepime >>  Dose adjustments this admission:   Microbiology results: 10/17 BCx:  10/17 UCx:   Thank you for allowing pharmacy to be a part of this patient's care.  Dolly Rias RPh 01/07/2021, 12:28 AM

## 2021-01-07 NOTE — Progress Notes (Signed)
Initial Nutrition Assessment  DOCUMENTATION CODES:   Not applicable  INTERVENTION:   - Ensure Max po daily, each supplement provides 150 kcal and 30 grams of protein  - MVI with minerals daily  - Encourage PO intake  NUTRITION DIAGNOSIS:   Increased nutrient needs related to acute illness, cancer and cancer related treatments as evidenced by estimated needs.  GOAL:   Patient will meet greater than or equal to 90% of their needs  MONITOR:   Supplement acceptance, Labs, Weight trends, PO intake  REASON FOR ASSESSMENT:   Malnutrition Screening Tool    ASSESSMENT:   65 year old female who presented to the ED on 10/17 with nausea, fever. PMH of DM, HTN, HLD, breast cancer currently undergoing chemotherapy. Pt admitted with SIRS.  RD working remotely.  Spoke with pt via phone call to room. Pt reports that she still has some nausea but that it is improved compared to yesterday. Pt vomited 1 time yesterday but has not vomited today. Pt reports consuming almost 100% of her breakfast meal tray (everything except apple).  Pt states that her appetite has been somewhat decreased since starting chemotherapy on 10/11 which was the date of her first chemotherapy treatment. Pt states that she has been eating 2 meals daily with 1-2 snacks. Breakfast typically includes oatmeal or cold cereal or scrambled eggs. Pt may have a midday snack which may include crackers and fruit. Dinner typically includes grilled chicken with sides or a burger. Pt occasionally has another snack in the evening. Pt states that she consumes 1 Premier Protein shake daily at home.  Pt reports recently taking steroids which have caused her blood sugars to be elevated. Pt states that she is not taking the steroids right now.  Pt states that her diverticulitis is causing her "stomach issues" which she explains includes cramps and diarrhea. Pt states that this is still occurring.  Pt reports gaining weight recently. She  shares that 4-5 years ago she weighed 265 lbs. Pt intentionally lost weight down to 192 lbs and had been maintaining there for a while. Recently pt gained weight up to 207 lbs and is now 203 lbs. Recent weight fluctuations likely related to fluid and medications. Will continue to monitor weight trends.  Pt willing to consume oral nutrition supplement during admission. RD will also order daily MVI with minerals.  Meal Completion: 100% x 1 meal completed  Medications reviewed and include: cholecalciferol, SSI, semglee 45 units daily, klor-con 40 mEq once, IV abx IVF: LR @ 125 ml/hr  Labs reviewed: sodium 131, potassium 3.3, magnesium 1.5, lactic acid 2.1 CBG's: 203-323 x 24 hours  NUTRITION - FOCUSED PHYSICAL EXAM:  Unable to complete at this time. RD working remotely.  Diet Order:   Diet Order             Diet Carb Modified Fluid consistency: Thin; Room service appropriate? Yes  Diet effective now                   EDUCATION NEEDS:   Education needs have been addressed  Skin:  Skin Assessment: Skin Integrity Issues: Incisions: chest  Last BM:  01/07/21  Height:   Ht Readings from Last 1 Encounters:  01/07/21 5\' 7"  (1.702 m)    Weight:   Wt Readings from Last 1 Encounters:  01/07/21 92.3 kg    BMI:  Body mass index is 31.87 kg/m.  Estimated Nutritional Needs:   Kcal:  1800-2000  Protein:  85-100 grams  Fluid:  1.8-2.0  L    Gustavus Bryant, MS, RD, LDN Inpatient Clinical Dietitian Please see AMiON for contact information.

## 2021-01-07 NOTE — Evaluation (Signed)
Physical Therapy Evaluation Patient Details Name: [name] MRN: [id number] DOB: 1956/03/02 Today's Date: 01/07/2021  History of Present Illness  65 y.o. female with history of breast cancer recently started on chemotherapy about a week ago day before that patient had a Port-A-Cath placed on the right side presents to the ER because of fever chills and weakness.  Patient states after her chemotherapy last week she did well for the next 2 days then she has had some constipation and she took over-the-counter medications for constipation following which she started having multiple episodes of loose stools and dysuria started feeling fever chills weakness.  Has nausea no vomiting and has been having some left lower quadrant abdominal discomfort. Dx of SIRS.  Clinical Impression  Pt admitted with above diagnosis. Pt ambulated 12'x 2 with RW with min A for loss of balance x 1. Distance limited by pain and fatigue. Pt has decreased activity tolerance compared to her baseline of walking 2-3 miles/day.  Pt currently with functional limitations due to the deficits listed below (see PT Problem List). Pt will benefit from skilled PT to increase their independence and safety with mobility to allow discharge to the venue listed below.          Recommendations for follow up therapy are one component of a multi-disciplinary discharge planning process, led by the attending physician.  Recommendations may be updated based on patient status, additional functional criteria and insurance authorization.  Follow Up Recommendations Home health PT    Equipment Recommendations  Other (comment) (rollator)    Recommendations for Other Services       Precautions / Restrictions Precautions Precautions: Fall Precaution Comments: pt denies falls in past 1 year Restrictions Weight Bearing Restrictions: No      Mobility  Bed Mobility Overal bed mobility: Modified Independent             General bed mobility  comments: HOB up, used rail    Transfers Overall transfer level: Needs assistance Equipment used: Rolling walker (2 wheeled) Transfers: Sit to/from Stand Sit to Stand: Min guard;From elevated surface         General transfer comment: VCs for hand placement, min/guard for safety  Ambulation/Gait Ambulation/Gait assistance: Min guard;Min assist Gait Distance (Feet): 24 Feet Assistive device: Rolling walker (2 wheeled) Gait Pattern/deviations: Step-through pattern;Decreased stride length Gait velocity: decr   General Gait Details: 12' x 2 wtih seated rest break, distance limited by fatigue, but had mild loss of balance x 1 requiring min A  Stairs            Wheelchair Mobility    Modified Rankin (Stroke Patients Only)       Balance Overall balance assessment: Needs assistance   Sitting balance-Leahy Scale: Good       Standing balance-Leahy Scale: Fair Standing balance comment: relies on BUE support for dynamic standing balance                             Pertinent Vitals/Pain Pain Assessment: 0-10 Pain Score: 6  Pain Location: abdomen Pain Descriptors / Indicators: Aching Pain Intervention(s): Limited activity within patient's tolerance;Monitored during session;Patient requesting pain meds-RN notified;Premedicated before session    Home Living Family/patient expects to be discharged to:: Private residence Living Arrangements: Spouse/significant other Available Help at Discharge: Family;Available 24 hours/day Type of Home: House Home Access: Stairs to enter Entrance Stairs-Rails: None Entrance Stairs-Number of Steps: 3 Home Layout: One level Home Equipment: None  Prior Function Level of Independence: Independent         Comments: denies falls     Hand Dominance        Extremity/Trunk Assessment   Upper Extremity Assessment Upper Extremity Assessment: Overall WFL for tasks assessed;LUE deficits/detail LUE Sensation:  history of peripheral neuropathy    Lower Extremity Assessment Lower Extremity Assessment: Overall WFL for tasks assessed;LLE deficits/detail (4/5 B knees) LLE Deficits / Details: pt reports L knee "gives out" when going down stairs at times and that she has to hold onto a rail LLE Sensation: history of peripheral neuropathy    Cervical / Trunk Assessment Cervical / Trunk Assessment: Normal  Communication   Communication: No difficulties  Cognition Arousal/Alertness: Awake/alert Behavior During Therapy: WFL for tasks assessed/performed Overall Cognitive Status: Within Functional Limits for tasks assessed                                        General Comments      Exercises     Assessment/Plan    PT Assessment Patient needs continued PT services  PT Problem List Decreased mobility;Decreased activity tolerance;Decreased balance;Decreased knowledge of use of DME;Pain       PT Treatment Interventions Therapeutic activities;Therapeutic exercise;Gait training;Patient/family education;Balance training    PT Goals (Current goals can be found in the Care Plan section)  Acute Rehab PT Goals Patient Stated Goal: to be able to walk 2-3 miles again PT Goal Formulation: With patient/family Time For Goal Achievement: 01/21/21 Potential to Achieve Goals: Good    Frequency Min 3X/week   Barriers to discharge        Co-evaluation               AM-PAC PT "6 Clicks" Mobility  Outcome Measure Help needed turning from your back to your side while in a flat bed without using bedrails?: A Little Help needed moving from lying on your back to sitting on the side of a flat bed without using bedrails?: A Little Help needed moving to and from a bed to a chair (including a wheelchair)?: A Little Help needed standing up from a chair using your arms (e.g., wheelchair or bedside chair)?: A Little Help needed to walk in hospital room?: A Little Help needed climbing 3-5  steps with a railing? : A Lot 6 Click Score: 17    End of Session Equipment Utilized During Treatment: Gait belt Activity Tolerance: Patient limited by fatigue Patient left: in chair;with call bell/phone within reach;with chair alarm set Nurse Communication: Mobility status PT Visit Diagnosis: Difficulty in walking, not elsewhere classified (R26.2);Pain;Muscle weakness (generalized) (M62.81)    Time: 9937-1696 PT Time Calculation (min) (ACUTE ONLY): 20 min   Charges:   PT Evaluation $PT Eval Moderate Complexity: 1 Mod          Philomena Doheny PT 01/07/2021  Acute Rehabilitation Services Pager [phone] Office [phone]

## 2021-01-07 NOTE — Progress Notes (Signed)
[name]   DOB:05-31-1955   YH#:[id number]   TDV#:[id number]  Subjective:  65 year old [place] woman whom I follow for early stage breast cancer, s/p cycle 1 chemotherapy with cyclophosphamide and docetaxel 12/31/2020, admitted with nausea, vomiting, dysuria, abdominal pain and fever.  [name] tells me she did fine on days 1 through 3 after her chemo.  Then she became severely constipated and had a very hard bowel movement 01/03/2021 (it tore my bottom up").  Subsequently she felt weak, developed dysuria ("I have frequent urine infections") and fever, which brought her to the emergency room.  CT angio of the chest showed no pulmonary embolism.  She does have a history of diverticular disease and that was noted on CT of the abdomen and pelvis.  There is also edema associated with her Port-A-Cath in the right upper anterior chest and 2 fluid collections in the right breast measuring 5.1 and 4.2 cm.  She was admitted and started on cefepime/metronidazole, vancomycin.  This morning as she tells me she is still mildly nauseated but those symptoms are better.  She is still experiencing discomfort in the lower abdomen bilaterally.  Family not in room  Objective: White woman examined in bed Vitals:   01/07/21 0454 01/07/21 0630  BP: (!) 139/59   Pulse: 99   Resp: 20   Temp: 99.8 F (37.7 C) 98.6 F (37 C)  SpO2: 93%     Body mass index is 31.87 kg/m.  Intake/Output Summary (Last 24 hours) at 01/07/2021 0805 Last data filed at 01/07/2021 0321 Gross per 24 hour  Intake 315.92 ml  Output --  Net 315.92 ml     Sclerae unicteric  Lungs no rales or wheezes--auscultated anterolaterally  Heart regular rate and rhythm  Abdomen soft, +BS, mildly tender diffusely without rebound  Neuro nonfocal  Skin: Mild tenderness associated with the port but no erythema or fluctuance  CBG (last 3)  Recent Labs    01/07/21 0035 01/07/21 0453  GLUCAP 323* 205*     Labs:  Lab Results  Component  Value Date   WBC 10.1 01/07/2021   HGB 10.7 (L) 01/07/2021   HCT 32.4 (L) 01/07/2021   MCV 89.0 01/07/2021   PLT 209 01/07/2021   NEUTROABS 5.8 01/07/2021    '@LASTCHEMISTRY' @  Urine Studies No results for input(s): UHGB, CRYS in the last 72 hours.  Invalid input(s): UACOL, UAPR, USPG, UPH, UTP, UGL, UKET, UBIL, UNIT, UROB, ULEU, UEPI, UWBC, URBC, UBAC, CAST, [place], [place]  Basic Metabolic Panel: Recent Labs  Lab 01/06/21 1840 01/07/21 0039 01/07/21 0418  NA 128*  --  131*  K 3.7  --  3.3*  CL 94*  --  99  CO2 22  --  27  GLUCOSE 301*  --  227*  BUN 10  --  10  CREATININE 0.61 0.79 0.58  CALCIUM 8.6*  --  8.0*   GFR Estimated Creatinine Clearance: 81.8 mL/min (by C-G formula based on SCr of 0.58 mg/dL). Liver Function Tests: Recent Labs  Lab 01/06/21 1840 01/07/21 0418  AST 29 21  ALT 36 25  ALKPHOS 72 70  BILITOT 0.6 0.6  PROT 7.4 6.2*  ALBUMIN 3.8 3.1*   Recent Labs  Lab 01/06/21 1840  LIPASE 20   No results for input(s): AMMONIA in the last 168 hours. Coagulation profile No results for input(s): INR, PROTIME in the last 168 hours.  CBC: Recent Labs  Lab 01/06/21 1840 01/07/21 0039 01/07/21 0418  WBC 6.9 10.3 10.1  NEUTROABS  --   --  5.8  HGB 12.7 11.8* 10.7*  HCT 38.6 36.4 32.4*  MCV 89.4 91.5 89.0  PLT 255 230 209   Cardiac Enzymes: No results for input(s): CKTOTAL, CKMB, CKMBINDEX, TROPONINI in the last 168 hours. BNP: Invalid input(s): POCBNP CBG: Recent Labs  Lab 01/07/21 0035 01/07/21 0453  GLUCAP 323* 205*   D-Dimer No results for input(s): DDIMER in the last 72 hours. Hgb A1c No results for input(s): HGBA1C in the last 72 hours. Lipid Profile No results for input(s): CHOL, HDL, LDLCALC, TRIG, CHOLHDL, LDLDIRECT in the last 72 hours. Thyroid function studies No results for input(s): TSH, T4TOTAL, T3FREE, THYROIDAB in the last 72 hours.  Invalid input(s): FREET3 Anemia work up No results for input(s): VITAMINB12, FOLATE,  FERRITIN, TIBC, IRON, RETICCTPCT in the last 72 hours. Microbiology Recent Results (from the past 240 hour(s))  Resp Panel by RT-PCR (Flu A&B, Covid) Nasopharyngeal Swab     Status: None   Collection Time: 01/06/21  6:12 PM   Specimen: Nasopharyngeal Swab; Nasopharyngeal(NP) swabs in vial transport medium  Result Value Ref Range Status   SARS Coronavirus 2 by RT PCR NEGATIVE NEGATIVE Final    Comment: (NOTE) SARS-CoV-2 target nucleic acids are NOT DETECTED.  The SARS-CoV-2 RNA is generally detectable in upper respiratory specimens during the acute phase of infection. The lowest concentration of SARS-CoV-2 viral copies this assay can detect is 138 copies/mL. A negative result does not preclude SARS-Cov-2 infection and should not be used as the sole basis for treatment or other patient management decisions. A negative result may occur with  improper specimen collection/handling, submission of specimen other than nasopharyngeal swab, presence of viral mutation(s) within the areas targeted by this assay, and inadequate number of viral copies(<138 copies/mL). A negative result must be combined with clinical observations, patient history, and epidemiological information. The expected result is Negative.  Fact Sheet for Patients:  EntrepreneurPulse.com.au  Fact Sheet for Healthcare Providers:  IncredibleEmployment.be  This test is no t yet approved or cleared by the [place] FDA and  has been authorized for detection and/or diagnosis of SARS-CoV-2 by FDA under an Emergency Use Authorization (EUA). This EUA will remain  in effect (meaning this test can be used) for the duration of the COVID-19 declaration under Section 564(b)(1) of the Act, 21 U.S.C.section 360bbb-3(b)(1), unless the authorization is terminated  or revoked sooner.       Influenza A by PCR NEGATIVE NEGATIVE Final   Influenza B by PCR NEGATIVE NEGATIVE Final    Comment:  (NOTE) The Xpert Xpress SARS-CoV-2/FLU/RSV plus assay is intended as an aid in the diagnosis of influenza from Nasopharyngeal swab specimens and should not be used as a sole basis for treatment. Nasal washings and aspirates are unacceptable for Xpert Xpress SARS-CoV-2/FLU/RSV testing.  Fact Sheet for Patients: EntrepreneurPulse.com.au  Fact Sheet for Healthcare Providers: IncredibleEmployment.be  This test is not yet approved or cleared by the [place] FDA and has been authorized for detection and/or diagnosis of SARS-CoV-2 by FDA under an Emergency Use Authorization (EUA). This EUA will remain in effect (meaning this test can be used) for the duration of the COVID-19 declaration under Section 564(b)(1) of the Act, 21 U.S.C. section 360bbb-3(b)(1), unless the authorization is terminated or revoked.  Performed at [facility], [place] [address], [place], [place] [address]   Blood culture (routine x 2)     Status: None (Preliminary result)   Collection Time: 01/07/21 12:39 AM  Specimen: Right Antecubital; Blood  Result Value Ref Range Status   Specimen Description   Final    RIGHT ANTECUBITAL BLOOD Performed at [facility] [address] [place], [place] [address]    Special Requests   Final    BOTTLES DRAWN AEROBIC AND ANAEROBIC Blood Culture adequate volume Performed at [facility] [address] [place], [place] [address]    Culture PENDING  Incomplete   Report Status PENDING  Incomplete      Studies:  CT Angio Chest PE W and/or Wo Contrast  Result Date: 01/06/2021 CLINICAL DATA:  Shortness of breath. Abdominal infection, fever and pain. EXAM: CT ANGIOGRAPHY CHEST CT ABDOMEN AND PELVIS WITH CONTRAST TECHNIQUE: Multidetector CT imaging of the chest was performed using the standard protocol during bolus administration of intravenous contrast. Multiplanar CT image reconstructions and  MIPs were obtained to evaluate the vascular anatomy. Multidetector CT imaging of the abdomen and pelvis was performed using the standard protocol during bolus administration of intravenous contrast. CONTRAST:  71m OMNIPAQUE IOHEXOL 350 MG/ML SOLN COMPARISON:  None. FINDINGS: CTA CHEST FINDINGS Cardiovascular: Satisfactory opacification of the pulmonary arteries to the segmental level. No evidence of pulmonary embolism. The heart is borderline enlarged. There is no pericardial effusion. Aorta is normal in size. There are atherosclerotic calcifications of the aorta. Chest port catheter tip ends in the SVC. Mediastinum/Nodes: No enlarged mediastinal, hilar, or axillary lymph nodes. Thyroid gland, trachea, and esophagus demonstrate no significant findings. Lungs/Pleura: There is mild atelectasis in the bilateral lower lobes. The lungs otherwise appear clear. There is no pleural effusion or pneumothorax. Trachea and central airways are patent. Musculoskeletal: No acute fracture. Right sided chest port is present. There is marked subcutaneous edema in the anterior right chest wall surrounding the port. There is some soft tissue air in the lower right neck, likely related to port placement, although infection is not excluded. There are 2 fluid collections within the right breast with mild surrounding stranding. One is located in the lateral right breast measuring 5.1 by 3.1 by 4.8 cm. The other is located in the superior central breast measuring 4.2 by 1.5 by 2.5 cm. Review of the MIP images confirms the above findings. CT ABDOMEN and PELVIS FINDINGS Hepatobiliary: The gallbladder surgically absent. No focal liver lesions are identified. There is no biliary ductal dilatation. Pancreas: There is a fat attenuation lesion in the neck of the pancreas measuring 10 x 17 mm compatible with lipoma. Pancreas otherwise appears within normal limits. Spleen: Normal in size without focal abnormality. Adrenals/Urinary Tract: Adrenal  glands are unremarkable. Kidneys are normal, without renal calculi, focal lesion, or hydronephrosis. Bladder is unremarkable. Stomach/Bowel: There is no evidence for bowel obstruction or free air. The appendix is within normal limits. There is sigmoid colon diverticulosis. There is some mild inflammatory stranding surrounding diverticula in the sigmoid colon compatible with acute diverticulitis. There is no evidence for perforation or abscess. Small bowel and stomach are within normal limits. Vascular/Lymphatic: Aortic atherosclerosis. No enlarged abdominal or pelvic lymph nodes. Reproductive: Status post hysterectomy. No adnexal masses. Other: No abdominal wall hernia or abnormality. No abdominopelvic ascites. Musculoskeletal: Degenerative changes affect the spine. Review of the MIP images confirms the above findings. IMPRESSION: 1. No evidence for pulmonary embolism. 2. Right chest port in place. There is significant subcutaneous stranding surrounding the right chest port with some soft tissue air in the lower right neck. Findings may be related to recent port placement; however, soft tissue infection is not  excluded. 3. There are 2 right breast fluid collections, indeterminate. The largest measures up to 5.1 cm. 4. Acute uncomplicated sigmoid colon diverticulitis. 5. Pancreatic lipoma. Electronically Signed   By: Ronney Asters M.D.   On: 01/06/2021 20:58   CT ABDOMEN PELVIS W CONTRAST  Result Date: 01/06/2021 CLINICAL DATA:  Shortness of breath. Abdominal infection, fever and pain. EXAM: CT ANGIOGRAPHY CHEST CT ABDOMEN AND PELVIS WITH CONTRAST TECHNIQUE: Multidetector CT imaging of the chest was performed using the standard protocol during bolus administration of intravenous contrast. Multiplanar CT image reconstructions and MIPs were obtained to evaluate the vascular anatomy. Multidetector CT imaging of the abdomen and pelvis was performed using the standard protocol during bolus administration of  intravenous contrast. CONTRAST:  55m OMNIPAQUE IOHEXOL 350 MG/ML SOLN COMPARISON:  None. FINDINGS: CTA CHEST FINDINGS Cardiovascular: Satisfactory opacification of the pulmonary arteries to the segmental level. No evidence of pulmonary embolism. The heart is borderline enlarged. There is no pericardial effusion. Aorta is normal in size. There are atherosclerotic calcifications of the aorta. Chest port catheter tip ends in the SVC. Mediastinum/Nodes: No enlarged mediastinal, hilar, or axillary lymph nodes. Thyroid gland, trachea, and esophagus demonstrate no significant findings. Lungs/Pleura: There is mild atelectasis in the bilateral lower lobes. The lungs otherwise appear clear. There is no pleural effusion or pneumothorax. Trachea and central airways are patent. Musculoskeletal: No acute fracture. Right sided chest port is present. There is marked subcutaneous edema in the anterior right chest wall surrounding the port. There is some soft tissue air in the lower right neck, likely related to port placement, although infection is not excluded. There are 2 fluid collections within the right breast with mild surrounding stranding. One is located in the lateral right breast measuring 5.1 by 3.1 by 4.8 cm. The other is located in the superior central breast measuring 4.2 by 1.5 by 2.5 cm. Review of the MIP images confirms the above findings. CT ABDOMEN and PELVIS FINDINGS Hepatobiliary: The gallbladder surgically absent. No focal liver lesions are identified. There is no biliary ductal dilatation. Pancreas: There is a fat attenuation lesion in the neck of the pancreas measuring 10 x 17 mm compatible with lipoma. Pancreas otherwise appears within normal limits. Spleen: Normal in size without focal abnormality. Adrenals/Urinary Tract: Adrenal glands are unremarkable. Kidneys are normal, without renal calculi, focal lesion, or hydronephrosis. Bladder is unremarkable. Stomach/Bowel: There is no evidence for bowel  obstruction or free air. The appendix is within normal limits. There is sigmoid colon diverticulosis. There is some mild inflammatory stranding surrounding diverticula in the sigmoid colon compatible with acute diverticulitis. There is no evidence for perforation or abscess. Small bowel and stomach are within normal limits. Vascular/Lymphatic: Aortic atherosclerosis. No enlarged abdominal or pelvic lymph nodes. Reproductive: Status post hysterectomy. No adnexal masses. Other: No abdominal wall hernia or abnormality. No abdominopelvic ascites. Musculoskeletal: Degenerative changes affect the spine. Review of the MIP images confirms the above findings. IMPRESSION: 1. No evidence for pulmonary embolism. 2. Right chest port in place. There is significant subcutaneous stranding surrounding the right chest port with some soft tissue air in the lower right neck. Findings may be related to recent port placement; however, soft tissue infection is not excluded. 3. There are 2 right breast fluid collections, indeterminate. The largest measures up to 5.1 cm. 4. Acute uncomplicated sigmoid colon diverticulitis. 5. Pancreatic lipoma. Electronically Signed   By: ARonney AstersM.D.   On: 01/06/2021 20:58   DG Chest Port 1 View  Result Date: 01/06/2021  CLINICAL DATA:  Hypoxia and fevers EXAM: PORTABLE CHEST 1 VIEW COMPARISON:  12/30/2020 FINDINGS: Stable right-sided chest wall port is noted with catheter tip in the mid superior vena cava. No pneumothorax is noted. Cardiac shadow is within normal limits. The lungs are well aerated without focal infiltrate or effusion. No bony abnormality is noted. IMPRESSION: No acute abnormality seen. Electronically Signed   By: Inez Catalina M.D.   On: 01/06/2021 19:26    Assessment: 65 y.o.[place] woman status post right breast upper inner quadrant biopsy 10/04/2020 for clinical T1b N0, stage IA invasive lobular carcinoma, E-cadherin negative, grade 1, estrogen receptor positive,  progesterone receptor and HER2 negative, with an MIB-1 of 10%; admitted 01/06/2021 with fever, nausea, vomiting, dysuria and abdominal discomfort  (A) CT angio chest shows no pulmonary embolus, stranding associated with port, 2 right breast fluid collections   (B) CT abdomen and pelvis shows sigmoid diverticulitis  (1) status post right lumpectomy and sentinel lymph node sampling 11/05/2020 for a PT1b pN0, stage IA invasive lobular carcinoma, grade 2   (2) Oncotype score of 32 predicts a risk of recurrence outside the breast within the next 9 years of 20% if the patient's only systemic therapy is antiestrogens for 5 years.  It also predicts significant benefit from chemotherapy.   (3) adjuvant chemotherapy with cyclophosphamide and docetaxel every 21 days x 4 to start 12/31/2020   (4) adjuvant radiation to follow   (5) antiestrogens to start at the completion of local treatment.    Plan:  Myrel is now day 8 cycle 1 of cyclophosphamide and docetaxel.  She received PEG fill gastrim on 11/13.  She is not neutropenic.  There are several possible sources for her fever including diverticulitis, rectal trauma secondary to recent very hard bowel movement, urinary tract infection, infected right breast fluid collections and port infection.  The latter appears unlikely by exam.  Blood cultures and urine culture are pending.  Antibiotics currently in place cover all above possibilities.  Ultrasound of the right breast with aspiration of the fluid collections pending  The good news is that Keylani is not neutropenic.  I anticipate she will improve rapidly with current treatment.  Hopefully within 2 days we will have narrowed the possibilities and she may be stable enough to be discharged on oral antibiotics.  Will follow with you   [name], MD 01/07/2021  8:05 AM Medical Oncology and Hematology [facility] [address] [place], [place] [address] Tel. [phone]    Fax.  [phone]

## 2021-01-07 NOTE — Progress Notes (Signed)
I have seen and assessed patient and I agree with Dr. Moise Boring assessment and plan.  Patient is a pleasant 65 year old female history of breast cancer recently started on chemotherapy approximately a week ago with Port-A-Cath placed right side presented to the ED with fever chills and weakness as well as some concern for constipation.  Patient also with multiple episodes of loose stools and dysuria.  Patient seen in the ED noted to have a temp of 102, COVID-19 PCR negative, lactic acid level of 2.1.  CT chest and abdomen showed features concerning for fluid stranding around the Port-A-Cath which could be postop changes or developing infection as well as fluid collection in the right breast area concerning for developing infection as well as acute sigmoid diverticulitis.  Urinalysis also concerning for UTI.  Patient pancultured.  Patient placed on empiric IV antibiotics.  Patient seen in consultation by general surgery who feel Port-A-Cath does not look infected and no need for any further evaluation in terms of the Port-A-Cath.  Ultrasound of the breast and aspirate ordered per general surgery.  Patient seen in consultation by hematology/oncology.  Continue empiric IV antibiotics, supportive care.  No charge.

## 2021-01-07 NOTE — Procedures (Signed)
Vascular and Interventional Radiology Procedure Note  Patient: [name] DOB: [birth year] Medical Record Number: [id number] Note Date/Time: 01/07/21 1:43 PM   Performing Physician: Michaelle Birks, MD Assistant(s): None  Diagnosis: Febrile. Infectious workup.  Procedure: ASPIRATION OF RIGHT AXILLARY FLUID COLLECTION  Anesthesia: Local Anesthetic Complications: None Estimated Blood Loss:  0 mL Specimens: Sent for Gram Stain, Aerobe Culture, and Anerobe Culture  Findings:  Successful Ultrasound-guided asouration of R axillary fluid collection. 60mL SS fluid aspirated. Likely seroma. Sent for GS+Cs   See detailed procedure note with images in PACS. The patient tolerated the procedure well without incident or complication and was returned to Floor Bed in stable condition.    Michaelle Birks, MD [facility] Radiology   Pager. [place]

## 2021-01-07 NOTE — H&P (Signed)
History and Physical    [name]:[id number] DOB: May 17, 1955 DOA: 01/06/2021  PCP: Antony Contras, MD  Patient coming from: Home.  Chief Complaint: Fever weakness.  HPI: [name] is a 65 y.o. female with history of breast cancer recently started on chemotherapy about a week ago day before that patient had a Port-A-Cath placed on the right side presents to the ER because of fever chills and weakness.  Patient states after her chemotherapy last week she did well for the next 2 days then she has had some constipation and she took over-the-counter medications for constipation following which she started having multiple episodes of loose stools and dysuria started feeling fever chills weakness and at this point decided to come to the ER.  Has nausea no vomiting and has been having some left lower quadrant abdominal discomfort.  Patient states after her lumpectomy in August she did have some fluid collection and also had been treated with antibiotics for possible infection.  ED Course: In the ER patient was febrile with temperature 102 F tachycardic with lab work showing sodium of 128 blood glucose of 302 anion gap of 12 lactic acid of 2.1 CBC unremarkable COVID test negative.  Patient underwent CT scan of the chest and abdomen pelvis.  Showed features concerning for fluid stranding around the Port-A-Cath which could be postoperative change or developing infection there is also fluid collection on the right breast area which is also concerning for possible developing infection and also acute sigmoid diverticulitis.  Patient was started on empiric antibiotics fluids admitted for further work-up.  UA is concerning for UTI.  Review of Systems: As per HPI, rest all negative.   Past Medical History:  Diagnosis Date   Cancer ([place])    Cataract    removed   Diabetes mellitus without complication ([place])    History of kidney stones    Hyperlipidemia    Hypertension    PONV (postoperative nausea  and vomiting)     Past Surgical History:  Procedure Laterality Date   ABDOMINAL HYSTERECTOMY     BREAST LUMPECTOMY WITH RADIOACTIVE SEED AND SENTINEL LYMPH NODE BIOPSY Right 11/05/2020   Procedure: RIGHT BREAST LUMPECTOMY WITH RADIOACTIVE SEED AND RIGHT AXILLARY SENTINEL LYMPH NODE BIOPSY;  Surgeon: Rolm Bookbinder, MD;  Location: [place];  Service: General;  Laterality: Right;   CHOLECYSTECTOMY     KNEE SURGERY     PORTACATH PLACEMENT Right 12/30/2020   Procedure: INSERTION PORT-A-CATH;  Surgeon: Rolm Bookbinder, MD;  Location: [place];  Service: General;  Laterality: Right;     reports that she quit smoking about 39 years ago. Her smoking use included cigarettes. She has a 5.00 pack-year smoking history. She has never used smokeless tobacco. She reports that she does not drink alcohol and does not use drugs.  Allergies  Allergen Reactions   Glimepiride     Hypoglycemia     Family History  Problem Relation Age of Onset   Heart attack Mother    Diabetes Father    Colon cancer Maternal Grandmother    Breast cancer Cousin        dx early 56's    Prior to Admission medications   Medication Sig Start Date End Date Taking? Authorizing Provider  acetaminophen (TYLENOL) 500 MG tablet Take 1,000 mg by mouth every 6 (six) hours as needed for moderate pain.   Yes [provider]  amitriptyline (ELAVIL) 25 MG tablet Take 25 mg by mouth at bedtime.  Yes [provider]  cetirizine (ZYRTEC) 10 MG tablet Take 10 mg by mouth at bedtime.   Yes [provider]  Cholecalciferol (DIALYVITE VITAMIN D 5000) 125 MCG (5000 UT) capsule Take 5,000 Units by mouth daily.   Yes [provider]  dexamethasone (DECADRON) 4 MG tablet Take 2 tablets (8 mg total) by mouth 2 (two) times daily. Start the day before Taxotere. Then again the day after chemo for 3 days. Patient taking differently: Take 8 mg by mouth See admin instructions. Take 2 tablets by  mouth on Mon wednesdays and fridays per patient 12/11/20  Yes Magrinat, Virgie Dad, MD  ibuprofen (ADVIL) 200 MG tablet Take 400 mg by mouth every 6 (six) hours as needed for moderate pain or headache.   Yes [provider]  LORazepam (ATIVAN) 0.5 MG tablet Take 1 tablet (0.5 mg total) by mouth at bedtime as needed (Nausea or vomiting). 12/11/20  Yes Magrinat, Virgie Dad, MD  losartan (COZAAR) 25 MG tablet Take 25 mg by mouth daily.   Yes [provider]  metFORMIN (GLUCOPHAGE) 500 MG tablet Take 1,000 mg by mouth 2 (two) times daily.   Yes [provider]  Multiple Vitamin (MULTIVITAMIN WITH MINERALS) TABS tablet Take 1 tablet by mouth daily.   Yes [provider]  Probiotic Product (PROBIOTIC PO) Take 1 capsule by mouth daily.   Yes [provider]  prochlorperazine (COMPAZINE) 10 MG tablet Take 1 tablet (10 mg total) by mouth every 6 (six) hours as needed (Nausea or vomiting). 12/11/20  Yes Magrinat, Virgie Dad, MD  sertraline (ZOLOFT) 50 MG tablet Take 50 mg by mouth daily.   Yes [provider]  simvastatin (ZOCOR) 40 MG tablet Take 40 mg by mouth at bedtime.   Yes [provider]  TOUJEO SOLOSTAR 300 UNIT/ML Solostar Pen Inject 45 Units into the skin daily. 07/22/20  Yes [provider]  ACCU-CHEK GUIDE test strip  07/23/20   [provider]  Blood Glucose Monitoring Suppl (ACCU-CHEK GUIDE ME) w/Device KIT  07/23/20   [provider]  Insulin Syringe-Needle U-100 (INSULIN SYRINGE .5CC/30GX5/16") 30G X 5/16" 0.5 ML MISC See admin instructions. 04/23/20   [provider]  lidocaine-prilocaine (EMLA) cream Apply to affected area once Patient taking differently: Apply 1 application topically. Apply to affected area once 12/11/20   Magrinat, Virgie Dad, MD    Physical Exam: Constitutional: Moderately built and nourished. Vitals:   01/06/21 2130 01/06/21 2230 01/06/21 2330 01/07/21 0000  BP: 132/69 (!) 137/59 (!)  144/66 (!) 136/56  Pulse: (!) 103 100 100 (!) 102  Resp: 17 (!) 23 (!) 28 16  Temp:      TempSrc:      SpO2: 92% 95% 96% 100%  Weight:      Height:       Eyes: Anicteric no pallor. ENMT: No discharge from the ears eyes nose and mouth. Neck: No mass felt.  No neck rigidity. Respiratory: No rhonchi or crepitations. Cardiovascular: S1-S2 heard. Abdomen: Soft nontender bowel sound present. Musculoskeletal: No edema. Skin: Mild erythema around the Port-A-Cath area. Neurologic: Alert awake oriented to time place and person.  Moves all extremities 5 x 5. Psychiatric: Appears normal.  Normal affect.   Labs on Admission: I have personally reviewed following labs and imaging studies  CBC: Recent Labs  Lab 12/31/20 0750 01/06/21 1840  WBC 7.6 6.9  NEUTROABS 5.2  --   HGB 11.1* 12.7  HCT 34.0* 38.6  MCV 89.9 89.4  PLT 196 981   Basic Metabolic Panel: Recent Labs  Lab 12/31/20 0750 01/06/21 1840  NA 138 128*  K 4.4 3.7  CL 102 94*  CO2 24 22  GLUCOSE 233* 301*  BUN 8 10  CREATININE 0.76 0.61  CALCIUM 9.2 8.6*   GFR: Estimated Creatinine Clearance: 80.1 mL/min (by C-G formula based on SCr of 0.61 mg/dL). Liver Function Tests: Recent Labs  Lab 12/31/20 0750 01/06/21 1840  AST 24 29  ALT 25 36  ALKPHOS 77 72  BILITOT 0.3 0.6  PROT 6.8 7.4  ALBUMIN 3.6 3.8   Recent Labs  Lab 01/06/21 1840  LIPASE 20   No results for input(s): AMMONIA in the last 168 hours. Coagulation Profile: No results for input(s): INR, PROTIME in the last 168 hours. Cardiac Enzymes: No results for input(s): CKTOTAL, CKMB, CKMBINDEX, TROPONINI in the last 168 hours. BNP (last 3 results) No results for input(s): PROBNP in the last 8760 hours. HbA1C: No results for input(s): HGBA1C in the last 72 hours. CBG: No results for input(s): GLUCAP in the last 168 hours. Lipid Profile: No results for input(s): CHOL, HDL, LDLCALC, TRIG, CHOLHDL, LDLDIRECT in the last 72 hours. Thyroid Function  Tests: No results for input(s): TSH, T4TOTAL, FREET4, T3FREE, THYROIDAB in the last 72 hours. Anemia Panel: No results for input(s): VITAMINB12, FOLATE, FERRITIN, TIBC, IRON, RETICCTPCT in the last 72 hours. Urine analysis:    Component Value Date/Time   COLORURINE AMBER (A) 01/06/2021 2145   APPEARANCEUR CLEAR 01/06/2021 2145   LABSPEC >1.046 (H) 01/06/2021 2145   PHURINE 5.0 01/06/2021 2145   GLUCOSEU 150 (A) 01/06/2021 2145   HGBUR NEGATIVE 01/06/2021 2145   [place] NEGATIVE 01/06/2021 2145   [place] NEGATIVE 01/06/2021 2145   PROTEINUR 30 (A) 01/06/2021 2145   NITRITE POSITIVE (A) 01/06/2021 2145   LEUKOCYTESUR TRACE (A) 01/06/2021 2145   Sepsis Labs: '@LABRCNTIP' (procalcitonin:4,lacticidven:4) ) Recent Results (from the past 240 hour(s))  Resp Panel by RT-PCR (Flu A&B, Covid) Nasopharyngeal Swab     Status: None   Collection Time: 01/06/21  6:12 PM   Specimen: Nasopharyngeal Swab; Nasopharyngeal(NP) swabs in vial transport medium  Result Value Ref Range Status   SARS Coronavirus 2 by RT PCR NEGATIVE NEGATIVE Final    Comment: (NOTE) SARS-CoV-2 target nucleic acids are NOT DETECTED.  The SARS-CoV-2 RNA is generally detectable in upper respiratory specimens during the acute phase of infection. The lowest concentration of SARS-CoV-2 viral copies this assay can detect is 138 copies/mL. A negative result does not preclude SARS-Cov-2 infection and should not be used as the sole basis for treatment or other patient management decisions. A negative result may occur with  improper specimen collection/handling, submission of specimen other than nasopharyngeal swab, presence of viral mutation(s) within the areas targeted by this assay, and inadequate number of viral copies(<138 copies/mL). A negative result must be combined with clinical observations, patient history, and epidemiological information. The expected result is Negative.  Fact Sheet for Patients:   EntrepreneurPulse.com.au  Fact Sheet for Healthcare Providers:  IncredibleEmployment.be  This test is no t yet approved or cleared by the [place] FDA and  has been authorized for detection and/or diagnosis of SARS-CoV-2 by FDA under an Emergency Use Authorization (EUA). This EUA will remain  in effect (meaning this test can be used) for the duration of the COVID-19 declaration under Section 564(b)(1) of the Act, 21 U.S.C.section 360bbb-3(b)(1), unless the authorization is terminated  or revoked sooner.       Influenza  A by PCR NEGATIVE NEGATIVE Final   Influenza B by PCR NEGATIVE NEGATIVE Final    Comment: (NOTE) The Xpert Xpress SARS-CoV-2/FLU/RSV plus assay is intended as an aid in the diagnosis of influenza from Nasopharyngeal swab specimens and should not be used as a sole basis for treatment. Nasal washings and aspirates are unacceptable for Xpert Xpress SARS-CoV-2/FLU/RSV testing.  Fact Sheet for Patients: EntrepreneurPulse.com.au  Fact Sheet for Healthcare Providers: IncredibleEmployment.be  This test is not yet approved or cleared by the [place] FDA and has been authorized for detection and/or diagnosis of SARS-CoV-2 by FDA under an Emergency Use Authorization (EUA). This EUA will remain in effect (meaning this test can be used) for the duration of the COVID-19 declaration under Section 564(b)(1) of the Act, 21 U.S.C. section 360bbb-3(b)(1), unless the authorization is terminated or revoked.  Performed at [facility], [place] [address] [place], [place] [address]      Radiological Exams on Admission: CT Angio Chest PE W and/or Wo Contrast  Result Date: 01/06/2021 CLINICAL DATA:  Shortness of breath. Abdominal infection, fever and pain. EXAM: CT ANGIOGRAPHY CHEST CT ABDOMEN AND PELVIS WITH CONTRAST TECHNIQUE: Multidetector CT imaging of the chest was performed  using the standard protocol during bolus administration of intravenous contrast. Multiplanar CT image reconstructions and MIPs were obtained to evaluate the vascular anatomy. Multidetector CT imaging of the abdomen and pelvis was performed using the standard protocol during bolus administration of intravenous contrast. CONTRAST:  78m OMNIPAQUE IOHEXOL 350 MG/ML SOLN COMPARISON:  None. FINDINGS: CTA CHEST FINDINGS Cardiovascular: Satisfactory opacification of the pulmonary arteries to the segmental level. No evidence of pulmonary embolism. The heart is borderline enlarged. There is no pericardial effusion. Aorta is normal in size. There are atherosclerotic calcifications of the aorta. Chest port catheter tip ends in the SVC. Mediastinum/Nodes: No enlarged mediastinal, hilar, or axillary lymph nodes. Thyroid gland, trachea, and esophagus demonstrate no significant findings. Lungs/Pleura: There is mild atelectasis in the bilateral lower lobes. The lungs otherwise appear clear. There is no pleural effusion or pneumothorax. Trachea and central airways are patent. Musculoskeletal: No acute fracture. Right sided chest port is present. There is marked subcutaneous edema in the anterior right chest wall surrounding the port. There is some soft tissue air in the lower right neck, likely related to port placement, although infection is not excluded. There are 2 fluid collections within the right breast with mild surrounding stranding. One is located in the lateral right breast measuring 5.1 by 3.1 by 4.8 cm. The other is located in the superior central breast measuring 4.2 by 1.5 by 2.5 cm. Review of the MIP images confirms the above findings. CT ABDOMEN and PELVIS FINDINGS Hepatobiliary: The gallbladder surgically absent. No focal liver lesions are identified. There is no biliary ductal dilatation. Pancreas: There is a fat attenuation lesion in the neck of the pancreas measuring 10 x 17 mm compatible with lipoma. Pancreas  otherwise appears within normal limits. Spleen: Normal in size without focal abnormality. Adrenals/Urinary Tract: Adrenal glands are unremarkable. Kidneys are normal, without renal calculi, focal lesion, or hydronephrosis. Bladder is unremarkable. Stomach/Bowel: There is no evidence for bowel obstruction or free air. The appendix is within normal limits. There is sigmoid colon diverticulosis. There is some mild inflammatory stranding surrounding diverticula in the sigmoid colon compatible with acute diverticulitis. There is no evidence for perforation or abscess. Small bowel and stomach are within normal limits. Vascular/Lymphatic: Aortic atherosclerosis. No enlarged abdominal or pelvic lymph nodes. Reproductive: Status post  hysterectomy. No adnexal masses. Other: No abdominal wall hernia or abnormality. No abdominopelvic ascites. Musculoskeletal: Degenerative changes affect the spine. Review of the MIP images confirms the above findings. IMPRESSION: 1. No evidence for pulmonary embolism. 2. Right chest port in place. There is significant subcutaneous stranding surrounding the right chest port with some soft tissue air in the lower right neck. Findings may be related to recent port placement; however, soft tissue infection is not excluded. 3. There are 2 right breast fluid collections, indeterminate. The largest measures up to 5.1 cm. 4. Acute uncomplicated sigmoid colon diverticulitis. 5. Pancreatic lipoma. Electronically Signed   By: Ronney Asters M.D.   On: 01/06/2021 20:58   CT ABDOMEN PELVIS W CONTRAST  Result Date: 01/06/2021 CLINICAL DATA:  Shortness of breath. Abdominal infection, fever and pain. EXAM: CT ANGIOGRAPHY CHEST CT ABDOMEN AND PELVIS WITH CONTRAST TECHNIQUE: Multidetector CT imaging of the chest was performed using the standard protocol during bolus administration of intravenous contrast. Multiplanar CT image reconstructions and MIPs were obtained to evaluate the vascular anatomy.  Multidetector CT imaging of the abdomen and pelvis was performed using the standard protocol during bolus administration of intravenous contrast. CONTRAST:  31m OMNIPAQUE IOHEXOL 350 MG/ML SOLN COMPARISON:  None. FINDINGS: CTA CHEST FINDINGS Cardiovascular: Satisfactory opacification of the pulmonary arteries to the segmental level. No evidence of pulmonary embolism. The heart is borderline enlarged. There is no pericardial effusion. Aorta is normal in size. There are atherosclerotic calcifications of the aorta. Chest port catheter tip ends in the SVC. Mediastinum/Nodes: No enlarged mediastinal, hilar, or axillary lymph nodes. Thyroid gland, trachea, and esophagus demonstrate no significant findings. Lungs/Pleura: There is mild atelectasis in the bilateral lower lobes. The lungs otherwise appear clear. There is no pleural effusion or pneumothorax. Trachea and central airways are patent. Musculoskeletal: No acute fracture. Right sided chest port is present. There is marked subcutaneous edema in the anterior right chest wall surrounding the port. There is some soft tissue air in the lower right neck, likely related to port placement, although infection is not excluded. There are 2 fluid collections within the right breast with mild surrounding stranding. One is located in the lateral right breast measuring 5.1 by 3.1 by 4.8 cm. The other is located in the superior central breast measuring 4.2 by 1.5 by 2.5 cm. Review of the MIP images confirms the above findings. CT ABDOMEN and PELVIS FINDINGS Hepatobiliary: The gallbladder surgically absent. No focal liver lesions are identified. There is no biliary ductal dilatation. Pancreas: There is a fat attenuation lesion in the neck of the pancreas measuring 10 x 17 mm compatible with lipoma. Pancreas otherwise appears within normal limits. Spleen: Normal in size without focal abnormality. Adrenals/Urinary Tract: Adrenal glands are unremarkable. Kidneys are normal, without  renal calculi, focal lesion, or hydronephrosis. Bladder is unremarkable. Stomach/Bowel: There is no evidence for bowel obstruction or free air. The appendix is within normal limits. There is sigmoid colon diverticulosis. There is some mild inflammatory stranding surrounding diverticula in the sigmoid colon compatible with acute diverticulitis. There is no evidence for perforation or abscess. Small bowel and stomach are within normal limits. Vascular/Lymphatic: Aortic atherosclerosis. No enlarged abdominal or pelvic lymph nodes. Reproductive: Status post hysterectomy. No adnexal masses. Other: No abdominal wall hernia or abnormality. No abdominopelvic ascites. Musculoskeletal: Degenerative changes affect the spine. Review of the MIP images confirms the above findings. IMPRESSION: 1. No evidence for pulmonary embolism. 2. Right chest port in place. There is significant subcutaneous stranding surrounding the right chest  port with some soft tissue air in the lower right neck. Findings may be related to recent port placement; however, soft tissue infection is not excluded. 3. There are 2 right breast fluid collections, indeterminate. The largest measures up to 5.1 cm. 4. Acute uncomplicated sigmoid colon diverticulitis. 5. Pancreatic lipoma. Electronically Signed   By: Ronney Asters M.D.   On: 01/06/2021 20:58   DG Chest Port 1 View  Result Date: 01/06/2021 CLINICAL DATA:  Hypoxia and fevers EXAM: PORTABLE CHEST 1 VIEW COMPARISON:  12/30/2020 FINDINGS: Stable right-sided chest wall port is noted with catheter tip in the mid superior vena cava. No pneumothorax is noted. Cardiac shadow is within normal limits. The lungs are well aerated without focal infiltrate or effusion. No bony abnormality is noted. IMPRESSION: No acute abnormality seen. Electronically Signed   By: Inez Catalina M.D.   On: 01/06/2021 19:26      Assessment/Plan Principal Problem:   SIRS (systemic inflammatory response syndrome) (HCC) Active  Problems:   Malignant neoplasm of upper-inner quadrant of right breast in female, estrogen receptor positive ([place])   Uncontrolled type 2 diabetes mellitus with hyperglycemia ([place])    SIRS -source not clear.  However since patient has dysuria and also CT scan showing features for possible diverticulitis this could be the source.  Since there is some fluid stranding around the Port-A-Cath area with erythema around the Port-A-Cath and fluid also on the right breast area ER physician has discussed with on-call general surgeon will be seeing patient in consult for further recommendations.  We will have to rule out these areas for source of infection.  Continue antibiotics follow cultures.  Continue IV hydration follow lactic acid levels. Uncontrolled diabetes mellitus type 2 with hyperglycemia takes Toujeo 45 units in the morning.  At this time patient blood sugar is elevated we will closely monitor CBGs with sliding scale coverage and metabolic panel. Breast cancer being followed by Dr. [name]. Take losartan.  Patient states for renal protection. Hyperlipidemia on statins.   DVT prophylaxis: Heparin. Code Status: Full code. Family Communication: Discussed with patient. Disposition Plan: Home. Consults called: General surgery. Admission status: Inpatient.   Rise Patience MD [facility] Pager [phone].  If 7PM-7AM, please contact night-coverage [email or web address] Password [email or web address]  01/07/2021, 12:17 AM

## 2021-01-07 NOTE — Progress Notes (Addendum)
Subjective/Chief Complaint: Patient well known to me after lumpectomy/sn complicated by breast infection that resolved with drainage, I put port in 10/10 and she had chemo a week ago.  Comes in with fever and weakness.  Also has some llq ab painToday she feels better   Objective: Vital signs in last 24 hours: Temp:  [98.6 F (37 C)-102.8 F (39.3 C)] 98.6 F (37 C) (10/18 0630) Pulse Rate:  [99-126] 99 (10/18 0454) Resp:  [16-30] 20 (10/18 0454) BP: (126-183)/(56-100) 139/59 (10/18 0454) SpO2:  [90 %-100 %] 93 % (10/18 0454) Weight:  [88.5 kg-92.3 kg] 92.3 kg (10/18 0026) Last BM Date: 01/07/21  Intake/Output from previous day: 10/17 0701 - 10/18 0700 In: 315.9 [I.V.:215.9; IV Piggyback:100] Out: -  Intake/Output this shift: No intake/output data recorded.  Right chest port without infection Right breast with some faint erythema, nontender, no fluctuance palpated Ab mild tender llq  Lab Results:  Recent Labs    01/07/21 0039 01/07/21 0418  WBC 10.3 10.1  HGB 11.8* 10.7*  HCT 36.4 32.4*  PLT 230 209   BMET Recent Labs    01/06/21 1840 01/07/21 0039 01/07/21 0418  NA 128*  --  131*  K 3.7  --  3.3*  CL 94*  --  99  CO2 22  --  27  GLUCOSE 301*  --  227*  BUN 10  --  10  CREATININE 0.61 0.79 0.58  CALCIUM 8.6*  --  8.0*   PT/INR No results for input(s): LABPROT, INR in the last 72 hours. ABG Recent Labs    01/06/21 2014  HCO3 24.3    Studies/Results: CT Angio Chest PE W and/or Wo Contrast  Result Date: 01/06/2021 CLINICAL DATA:  Shortness of breath. Abdominal infection, fever and pain. EXAM: CT ANGIOGRAPHY CHEST CT ABDOMEN AND PELVIS WITH CONTRAST TECHNIQUE: Multidetector CT imaging of the chest was performed using the standard protocol during bolus administration of intravenous contrast. Multiplanar CT image reconstructions and MIPs were obtained to evaluate the vascular anatomy. Multidetector CT imaging of the abdomen and pelvis was performed  using the standard protocol during bolus administration of intravenous contrast. CONTRAST:  60mL OMNIPAQUE IOHEXOL 350 MG/ML SOLN COMPARISON:  None. FINDINGS: CTA CHEST FINDINGS Cardiovascular: Satisfactory opacification of the pulmonary arteries to the segmental level. No evidence of pulmonary embolism. The heart is borderline enlarged. There is no pericardial effusion. Aorta is normal in size. There are atherosclerotic calcifications of the aorta. Chest port catheter tip ends in the SVC. Mediastinum/Nodes: No enlarged mediastinal, hilar, or axillary lymph nodes. Thyroid gland, trachea, and esophagus demonstrate no significant findings. Lungs/Pleura: There is mild atelectasis in the bilateral lower lobes. The lungs otherwise appear clear. There is no pleural effusion or pneumothorax. Trachea and central airways are patent. Musculoskeletal: No acute fracture. Right sided chest port is present. There is marked subcutaneous edema in the anterior right chest wall surrounding the port. There is some soft tissue air in the lower right neck, likely related to port placement, although infection is not excluded. There are 2 fluid collections within the right breast with mild surrounding stranding. One is located in the lateral right breast measuring 5.1 by 3.1 by 4.8 cm. The other is located in the superior central breast measuring 4.2 by 1.5 by 2.5 cm. Review of the MIP images confirms the above findings. CT ABDOMEN and PELVIS FINDINGS Hepatobiliary: The gallbladder surgically absent. No focal liver lesions are identified. There is no biliary ductal dilatation. Pancreas: There is a fat  attenuation lesion in the neck of the pancreas measuring 10 x 17 mm compatible with lipoma. Pancreas otherwise appears within normal limits. Spleen: Normal in size without focal abnormality. Adrenals/Urinary Tract: Adrenal glands are unremarkable. Kidneys are normal, without renal calculi, focal lesion, or hydronephrosis. Bladder is  unremarkable. Stomach/Bowel: There is no evidence for bowel obstruction or free air. The appendix is within normal limits. There is sigmoid colon diverticulosis. There is some mild inflammatory stranding surrounding diverticula in the sigmoid colon compatible with acute diverticulitis. There is no evidence for perforation or abscess. Small bowel and stomach are within normal limits. Vascular/Lymphatic: Aortic atherosclerosis. No enlarged abdominal or pelvic lymph nodes. Reproductive: Status post hysterectomy. No adnexal masses. Other: No abdominal wall hernia or abnormality. No abdominopelvic ascites. Musculoskeletal: Degenerative changes affect the spine. Review of the MIP images confirms the above findings. IMPRESSION: 1. No evidence for pulmonary embolism. 2. Right chest port in place. There is significant subcutaneous stranding surrounding the right chest port with some soft tissue air in the lower right neck. Findings may be related to recent port placement; however, soft tissue infection is not excluded. 3. There are 2 right breast fluid collections, indeterminate. The largest measures up to 5.1 cm. 4. Acute uncomplicated sigmoid colon diverticulitis. 5. Pancreatic lipoma. Electronically Signed   By: Ronney Asters M.D.   On: 01/06/2021 20:58   CT ABDOMEN PELVIS W CONTRAST  Result Date: 01/06/2021 CLINICAL DATA:  Shortness of breath. Abdominal infection, fever and pain. EXAM: CT ANGIOGRAPHY CHEST CT ABDOMEN AND PELVIS WITH CONTRAST TECHNIQUE: Multidetector CT imaging of the chest was performed using the standard protocol during bolus administration of intravenous contrast. Multiplanar CT image reconstructions and MIPs were obtained to evaluate the vascular anatomy. Multidetector CT imaging of the abdomen and pelvis was performed using the standard protocol during bolus administration of intravenous contrast. CONTRAST:  78mL OMNIPAQUE IOHEXOL 350 MG/ML SOLN COMPARISON:  None. FINDINGS: CTA CHEST FINDINGS  Cardiovascular: Satisfactory opacification of the pulmonary arteries to the segmental level. No evidence of pulmonary embolism. The heart is borderline enlarged. There is no pericardial effusion. Aorta is normal in size. There are atherosclerotic calcifications of the aorta. Chest port catheter tip ends in the SVC. Mediastinum/Nodes: No enlarged mediastinal, hilar, or axillary lymph nodes. Thyroid gland, trachea, and esophagus demonstrate no significant findings. Lungs/Pleura: There is mild atelectasis in the bilateral lower lobes. The lungs otherwise appear clear. There is no pleural effusion or pneumothorax. Trachea and central airways are patent. Musculoskeletal: No acute fracture. Right sided chest port is present. There is marked subcutaneous edema in the anterior right chest wall surrounding the port. There is some soft tissue air in the lower right neck, likely related to port placement, although infection is not excluded. There are 2 fluid collections within the right breast with mild surrounding stranding. One is located in the lateral right breast measuring 5.1 by 3.1 by 4.8 cm. The other is located in the superior central breast measuring 4.2 by 1.5 by 2.5 cm. Review of the MIP images confirms the above findings. CT ABDOMEN and PELVIS FINDINGS Hepatobiliary: The gallbladder surgically absent. No focal liver lesions are identified. There is no biliary ductal dilatation. Pancreas: There is a fat attenuation lesion in the neck of the pancreas measuring 10 x 17 mm compatible with lipoma. Pancreas otherwise appears within normal limits. Spleen: Normal in size without focal abnormality. Adrenals/Urinary Tract: Adrenal glands are unremarkable. Kidneys are normal, without renal calculi, focal lesion, or hydronephrosis. Bladder is unremarkable. Stomach/Bowel: There is  no evidence for bowel obstruction or free air. The appendix is within normal limits. There is sigmoid colon diverticulosis. There is some mild  inflammatory stranding surrounding diverticula in the sigmoid colon compatible with acute diverticulitis. There is no evidence for perforation or abscess. Small bowel and stomach are within normal limits. Vascular/Lymphatic: Aortic atherosclerosis. No enlarged abdominal or pelvic lymph nodes. Reproductive: Status post hysterectomy. No adnexal masses. Other: No abdominal wall hernia or abnormality. No abdominopelvic ascites. Musculoskeletal: Degenerative changes affect the spine. Review of the MIP images confirms the above findings. IMPRESSION: 1. No evidence for pulmonary embolism. 2. Right chest port in place. There is significant subcutaneous stranding surrounding the right chest port with some soft tissue air in the lower right neck. Findings may be related to recent port placement; however, soft tissue infection is not excluded. 3. There are 2 right breast fluid collections, indeterminate. The largest measures up to 5.1 cm. 4. Acute uncomplicated sigmoid colon diverticulitis. 5. Pancreatic lipoma. Electronically Signed   By: Ronney Asters M.D.   On: 01/06/2021 20:58   DG Chest Port 1 View  Result Date: 01/06/2021 CLINICAL DATA:  Hypoxia and fevers EXAM: PORTABLE CHEST 1 VIEW COMPARISON:  12/30/2020 FINDINGS: Stable right-sided chest wall port is noted with catheter tip in the mid superior vena cava. No pneumothorax is noted. Cardiac shadow is within normal limits. The lungs are well aerated without focal infiltrate or effusion. No bony abnormality is noted. IMPRESSION: No acute abnormality seen. Electronically Signed   By: Inez Catalina M.D.   On: 01/06/2021 19:26    Anti-infectives: Anti-infectives (From admission, onward)    Start     Dose/Rate Route Frequency Ordered Stop   01/07/21 2200  vancomycin (VANCOREADY) IVPB 1250 mg/250 mL        1,250 mg 166.7 mL/hr over 90 Minutes Intravenous Every 24 hours 01/07/21 0030     01/07/21 0400  ceFEPIme (MAXIPIME) 2 g in sodium chloride 0.9 % 100 mL IVPB         2 g 200 mL/hr over 30 Minutes Intravenous Every 8 hours 01/07/21 0030     01/07/21 0030  ceFEPIme (MAXIPIME) 2 g in sodium chloride 0.9 % 100 mL IVPB  Status:  Discontinued        2 g 200 mL/hr over 30 Minutes Intravenous  Once 01/07/21 0016 01/07/21 0021   01/07/21 0030  metroNIDAZOLE (FLAGYL) IVPB 500 mg        500 mg 100 mL/hr over 60 Minutes Intravenous Every 12 hours 01/07/21 0016 01/14/21 0029   01/07/21 0030  vancomycin (VANCOCIN) IVPB 1000 mg/200 mL premix  Status:  Discontinued        1,000 mg 200 mL/hr over 60 Minutes Intravenous  Once 01/07/21 0016 01/07/21 0021   01/06/21 1930  vancomycin (VANCOCIN) IVPB 1000 mg/200 mL premix        1,000 mg 200 mL/hr over 60 Minutes Intravenous  Once 01/06/21 1916 01/06/21 2238   01/06/21 1930  ceFEPIme (MAXIPIME) 2 g in sodium chloride 0.9 % 100 mL IVPB        2 g 200 mL/hr over 30 Minutes Intravenous  Once 01/06/21 1921 01/06/21 2054       Assessment/Plan: Fever on chemotherapy Breast cancer  -port is not infected, no need for anything further -she does have history of breast infection after surgery and a couple collections, breast has some mild redness. reasonable to US breast and aspirate while here to figure this out.  -she does appear to  have mild diverticulitis as well with stranding on ct and tenderness on exam- abx should resolve this   Rolm Bookbinder 01/07/2021

## 2021-01-08 DIAGNOSIS — R6510 Systemic inflammatory response syndrome (SIRS) of non-infectious origin without acute organ dysfunction: Secondary | ICD-10-CM | POA: Diagnosis not present

## 2021-01-08 LAB — CBC WITH DIFFERENTIAL/PLATELET
Abs Immature Granulocytes: 1.57 10*3/uL — ABNORMAL HIGH (ref 0.00–0.07)
Basophils Absolute: 0 10*3/uL (ref 0.0–0.1)
Basophils Relative: 0 %
Eosinophils Absolute: 0.1 10*3/uL (ref 0.0–0.5)
Eosinophils Relative: 0 %
HCT: 30.4 % — ABNORMAL LOW (ref 36.0–46.0)
Hemoglobin: 9.9 g/dL — ABNORMAL LOW (ref 12.0–15.0)
Immature Granulocytes: 10 %
Lymphocytes Relative: 16 %
Lymphs Abs: 2.6 10*3/uL (ref 0.7–4.0)
MCH: 29.6 pg (ref 26.0–34.0)
MCHC: 32.6 g/dL (ref 30.0–36.0)
MCV: 91 fL (ref 80.0–100.0)
Monocytes Absolute: 2.4 10*3/uL — ABNORMAL HIGH (ref 0.1–1.0)
Monocytes Relative: 15 %
Neutro Abs: 9.8 10*3/uL — ABNORMAL HIGH (ref 1.7–7.7)
Neutrophils Relative %: 59 %
Platelets: 206 10*3/uL (ref 150–400)
RBC: 3.34 MIL/uL — ABNORMAL LOW (ref 3.87–5.11)
RDW: 13.6 % (ref 11.5–15.5)
WBC: 16.5 10*3/uL — ABNORMAL HIGH (ref 4.0–10.5)
nRBC: 0.4 % — ABNORMAL HIGH (ref 0.0–0.2)

## 2021-01-08 LAB — RENAL FUNCTION PANEL
Albumin: 2.9 g/dL — ABNORMAL LOW (ref 3.5–5.0)
Anion gap: 5 (ref 5–15)
BUN: 6 mg/dL — ABNORMAL LOW (ref 8–23)
CO2: 26 mmol/L (ref 22–32)
Calcium: 8.2 mg/dL — ABNORMAL LOW (ref 8.9–10.3)
Chloride: 103 mmol/L (ref 98–111)
Creatinine, Ser: 0.52 mg/dL (ref 0.44–1.00)
GFR, Estimated: >60 mL/min (ref >60)
Glucose, Bld: 181 mg/dL — ABNORMAL HIGH (ref 70–99)
Phosphorus: 2.5 mg/dL (ref 2.5–4.6)
Potassium: 3.5 mmol/L (ref 3.5–5.1)
Sodium: 134 mmol/L — ABNORMAL LOW (ref 135–145)

## 2021-01-08 LAB — GLUCOSE, CAPILLARY
Glucose-Capillary: 239 mg/dL — ABNORMAL HIGH (ref 70–99)
Glucose-Capillary: 165 mg/dL — ABNORMAL HIGH (ref 70–99)
Glucose-Capillary: 221 mg/dL — ABNORMAL HIGH (ref 70–99)
Glucose-Capillary: 173 mg/dL — ABNORMAL HIGH (ref 70–99)
Glucose-Capillary: 310 mg/dL — ABNORMAL HIGH (ref 70–99)

## 2021-01-08 LAB — MAGNESIUM: Magnesium: 1.9 mg/dL (ref 1.7–2.4)

## 2021-01-08 MED ORDER — SACCHAROMYCES BOULARDII 250 MG PO CAPS
250.0000 mg | ORAL_CAPSULE | Freq: Two times a day (BID) | ORAL | Status: DC
  Administered 2021-01-08 – 2021-01-13 (×11): 250 mg via ORAL
  Filled 2021-01-08 (×11): qty 1

## 2021-01-08 MED ORDER — TRAZODONE HCL 50 MG PO TABS
50.0000 mg | ORAL_TABLET | ORAL | Status: AC | PRN
  Administered 2021-01-08: 50 mg via ORAL
  Filled 2021-01-08: qty 1

## 2021-01-08 MED ORDER — SODIUM CHLORIDE 0.9 % IV SOLN
2.0000 g | INTRAVENOUS | Status: DC
  Administered 2021-01-08 – 2021-01-13 (×6): 2 g via INTRAVENOUS
  Filled 2021-01-08 (×6): qty 20

## 2021-01-08 NOTE — Evaluation (Signed)
Occupational Therapy Evaluation Patient Details Name: [name] MRN: [id number] DOB: Feb 19, 1956 Today's Date: 01/08/2021   History of Present Illness 65 y.o. female with history of breast cancer recently started on chemotherapy about a week ago day before that patient had a Port-A-Cath placed on the right side presents to the ER because of fever chills and weakness.  Patient states after her chemotherapy last week she did well for the next 2 days then she has had some constipation and she took over-the-counter medications for constipation following which she started having multiple episodes of loose stools and dysuria started feeling fever chills weakness.  Has nausea no vomiting and has been having some left lower quadrant abdominal discomfort.patient was noted to have 40 mL of fluid aspirated from R axillary area on 10/18. Dx of SIRS.   Clinical Impression   Patient is a 65 year old female who was admitted for above. Patient was noted to require min guard for ADLs with eduction to use RW appropriately and supplemental O2 with patients O2 saturation dropping to 88% on RA during activity. Prior level patient was independent with ADLs with no AD. Patient would continue to benefit from skilled OT services at this time while admitted and after d/c to address noted deficits in order to improve overall safety and independence in ADLs.         Recommendations for follow up therapy are one component of a multi-disciplinary discharge planning process, led by the attending physician.  Recommendations may be updated based on patient status, additional functional criteria and insurance authorization.   Follow Up Recommendations  Home health OT;Supervision/Assistance - 24 hour    Equipment Recommendations  None recommended by OT    Recommendations for Other Services       Precautions / Restrictions Precautions Precautions: Fall Precaution Comments: pt denies falls in past 1  year Restrictions Weight Bearing Restrictions: No      Mobility Bed Mobility Overal bed mobility: Modified Independent             General bed mobility comments: HOB up, used rail    Transfers Overall transfer level: Needs assistance   Transfers: Sit to/from Stand Sit to Stand: Min guard;From elevated surface         General transfer comment: VCs for hand placement, min/guard for safety    Balance Overall balance assessment: Needs assistance   Sitting balance-Leahy Scale: Good     Standing balance support: During functional activity Standing balance-Leahy Scale: Fair                             ADL either performed or assessed with clinical judgement   ADL Overall ADL's : Needs assistance/impaired Eating/Feeding: Set up;Sitting Eating/Feeding Details (indicate cue type and reason): in recliner Grooming: Wash/dry face;Wash/dry hands;Sitting Grooming Details (indicate cue type and reason): in recliner. patient was able to wash hands standing at sink after toileting tasks. Upper Body Bathing: Min guard;Sitting   Lower Body Bathing: Min guard;Sitting/lateral leans   Upper Body Dressing : Min guard;Sitting   Lower Body Dressing: Sitting/lateral leans;Min guard   Toilet Transfer: Pharmacist, community and Hygiene: Min guard;Sit to/from stand Toileting - Clothing Manipulation Details (indicate cue type and reason): with RW     Functional mobility during ADLs: Min guard;Rolling walker General ADL Comments: no LOB observed on this date. patient did need cues for keeping RW close.     Vision  Baseline Vision/History: 1 Wears glasses Ability to See in Adequate Light: 1 Impaired Patient Visual Report: No change from baseline Vision Assessment?: No apparent visual deficits     Perception     Praxis      Pertinent Vitals/Pain Pain Assessment: 0-10 Pain Location: headache Pain Descriptors /  Indicators: Headache;Other (Comment) Pain Intervention(s): Limited activity within patient's tolerance;Monitored during session     Hand Dominance Right   Extremity/Trunk Assessment Upper Extremity Assessment Upper Extremity Assessment: Overall WFL for tasks assessed;RUE deficits/detail LUE Sensation: history of peripheral neuropathy   Lower Extremity Assessment Lower Extremity Assessment: Defer to PT evaluation   Cervical / Trunk Assessment Cervical / Trunk Assessment: Normal   Communication Communication Communication: No difficulties   Cognition Arousal/Alertness: Awake/alert Behavior During Therapy: WFL for tasks assessed/performed Overall Cognitive Status: Within Functional Limits for tasks assessed                                     General Comments       Exercises     Shoulder Instructions      Home Living Family/patient expects to be discharged to:: Private residence Living Arrangements: Spouse/significant other Available Help at Discharge: Family;Available 24 hours/day Type of Home: House Home Access: Stairs to enter [employer] of Steps: 3 Entrance Stairs-Rails: None Home Layout: One level     Bathroom Shower/Tub: Occupational psychologist: Standard     Home Equipment: None          Prior Functioning/Environment Level of Independence: Independent        Comments: denies falls        OT Problem List: Decreased strength;Decreased activity tolerance;Impaired balance (sitting and/or standing);Decreased safety awareness;Decreased knowledge of use of DME or AE      OT Treatment/Interventions: Self-care/ADL training;Therapeutic exercise;Neuromuscular education;Energy conservation;DME and/or AE instruction;Therapeutic activities;Balance training;Patient/family education    OT Goals(Current goals can be found in the care plan section) Acute Rehab OT Goals Patient Stated Goal: to be able to walk 2-3 miles  again OT Goal Formulation: With patient Time For Goal Achievement: 01/22/21 Potential to Achieve Goals: Good  OT Frequency: Min 2X/week   Barriers to D/C:            Co-evaluation              AM-PAC OT "6 Clicks" Daily Activity     Outcome Measure Help from another person eating meals?: None Help from another person taking care of personal grooming?: None Help from another person toileting, which includes using toliet, bedpan, or urinal?: A Little Help from another person bathing (including washing, rinsing, drying)?: A Little Help from another person to put on and taking off regular upper body clothing?: A Little Help from another person to put on and taking off regular lower body clothing?: A Little 6 Click Score: 20   End of Session Equipment Utilized During Treatment: Gait belt;Rolling walker  Activity Tolerance: Patient tolerated treatment well Patient left: in chair;with call bell/phone within reach;with chair alarm set  OT Visit Diagnosis: Unsteadiness on feet (R26.81);Muscle weakness (generalized) (M62.81)                Time: 8250-5397 OT Time Calculation (min): 27 min Charges:  OT General Charges $OT Visit: 1 Visit OT Evaluation $OT Eval Low Complexity: 1 Low OT Treatments $Self Care/Home Management : 8-22 mins  Jezabelle Chisolm OTR/L, MS Acute Rehabilitation Department  Office# [phone] Pager# [phone]   Merlene Morse Amel Kitch 01/08/2021, 12:51 PM

## 2021-01-08 NOTE — Progress Notes (Signed)
Physical Therapy Treatment Patient Details Name: [name] MRN: [id number] DOB: December 03, 1955 Today's Date: 01/08/2021   History of Present Illness 65 y.o. female with history of breast cancer recently started on chemotherapy about a week ago day before that patient had a Port-A-Cath placed on the right side presents to the ER because of fever chills and weakness.  Patient states after her chemotherapy last week she did well for the next 2 days then she has had some constipation and she took over-the-counter medications for constipation following which she started having multiple episodes of loose stools and dysuria started feeling fever chills weakness.  Has nausea no vomiting and has been having some left lower quadrant abdominal discomfort. Dx of SIRS.    PT Comments    Patient progressing mobility gradually and advanced gait distance to ~ 80' this session. Min assist required intermittently to manage walker and 2 short standing breaks taken during gait. EOS pt amb from bathroom back to bed without RW and use of IV pole in Lt UE. Pt reaching for increased support with Rt UE. She will benefit from RW at home for improved support with gait. Acute PT will continue to progress pt as able.     Recommendations for follow up therapy are one component of a multi-disciplinary discharge planning process, led by the attending physician.  Recommendations may be updated based on patient status, additional functional criteria and insurance authorization.  Follow Up Recommendations  Home health PT     Equipment Recommendations  Rolling walker with 5" wheels    Recommendations for Other Services       Precautions / Restrictions Precautions Precautions: Fall Precaution Comments: pt denies falls in past 1 year Restrictions Weight Bearing Restrictions: No     Mobility  Bed Mobility Overal bed mobility: Modified Independent             General bed mobility comments: HOB up, used rail and  extra time to go supine<>sit    Transfers Overall transfer level: Needs assistance Equipment used: Rolling walker (2 wheeled) Transfers: Sit to/from Stand Sit to Stand: Min guard;From elevated surface         General transfer comment: cues for hand placement on RW and EOB for power up. guarding for safety with rise from bed adn toilet. pt steady once standing.  Ambulation/Gait Ambulation/Gait assistance: Min guard;Min assist Gait Distance (Feet): 80 Feet Assistive device: Rolling walker (2 wheeled) Gait Pattern/deviations: Step-through pattern;Decreased stride length Gait velocity: decr   General Gait Details: pt amb in hallway for increasd gait distance. no overt LOB noted, min guard with intermittent assist for walker positioning. pt amb short distance from bathroom to bed with IV pole to steady. min assist IV.   Stairs             Wheelchair Mobility    Modified Rankin (Stroke Patients Only)       Balance Overall balance assessment: Needs assistance   Sitting balance-Leahy Scale: Good     Standing balance support: During functional activity Standing balance-Leahy Scale: Fair Standing balance comment: relies on BUE support for dynamic standing balance                            Cognition Arousal/Alertness: Awake/alert Behavior During Therapy: WFL for tasks assessed/performed Overall Cognitive Status: Within Functional Limits for tasks assessed  Exercises      General Comments        Pertinent Vitals/Pain Pain Assessment: Faces Faces Pain Scale: Hurts a little bit Pain Location: headache Pain Descriptors / Indicators: Headache;Other (Comment) Pain Intervention(s): Limited activity within patient's tolerance;Monitored during session    [place] expects to be discharged to:: Private residence Living Arrangements: Spouse/significant other Available Help at  Discharge: Family;Available 24 hours/day Type of Home: House Home Access: Stairs to enter Entrance Stairs-Rails: None Home Layout: One level Home Equipment: None      Prior Function Level of Independence: Independent      Comments: denies falls   PT Goals (current goals can now be found in the care plan section) Acute Rehab PT Goals Patient Stated Goal: to be able to walk 2-3 miles again PT Goal Formulation: With patient/family Time For Goal Achievement: 01/21/21 Potential to Achieve Goals: Good Progress towards PT goals: Progressing toward goals    Frequency    Min 3X/week      PT Plan Current plan remains appropriate    Co-evaluation              AM-PAC PT "6 Clicks" Mobility   Outcome Measure  Help needed turning from your back to your side while in a flat bed without using bedrails?: None Help needed moving from lying on your back to sitting on the side of a flat bed without using bedrails?: A Little Help needed moving to and from a bed to a chair (including a wheelchair)?: A Little Help needed standing up from a chair using your arms (e.g., wheelchair or bedside chair)?: A Little Help needed to walk in hospital room?: A Little Help needed climbing 3-5 steps with a railing? : A Little 6 Click Score: 19    End of Session Equipment Utilized During Treatment: Gait belt Activity Tolerance: Patient tolerated treatment well Patient left: in bed;with call bell/phone within reach;with family/visitor present Nurse Communication: Mobility status PT Visit Diagnosis: Difficulty in walking, not elsewhere classified (R26.2);Pain;Muscle weakness (generalized) (M62.81)     Time: 1962-2297 (8 minutes not billable, pt talking with MD) PT Time Calculation (min) (ACUTE ONLY): 35 min  Charges:  $Gait Training: 8-22 mins $Therapeutic Activity: 8-22 mins                     Verner Mould, DPT Acute Rehabilitation Services Office [phone] Pager [phone]     Jacques Navy 01/08/2021, 2:05 PM

## 2021-01-08 NOTE — Progress Notes (Addendum)
Patient ID: [name], female   DOB: 05-31-1955, 65 y.o.   MRN: [id number] [facility] Surgery Progress Note     Subjective: CC-  Up in chair. Feels about the same as yesterday. Continues to have some lower/ left lower abdominal pain. Nausea at times, no emesis. She reports having 4-5 loose stools yesterday. Tolerating diet but does not want to eat much. Right breast fluid collection aspirated yesterday, growing gram positive cocci. New complaint is headache and sore throat. WBC up 16.5 from 10.1, TMAX 99.5  Objective: Vital signs in last 24 hours: Temp:  [98.3 F (36.8 C)-99.5 F (37.5 C)] 98.8 F (37.1 C) (10/19 0515) Pulse Rate:  [90-98] 90 (10/19 0515) Resp:  [15-18] 16 (10/19 0515) BP: (122-145)/(55-75) 122/75 (10/19 0515) SpO2:  [92 %-97 %] 92 % (10/19 0515) Last BM Date: 01/07/21  Intake/Output from previous day: 10/18 0701 - 10/19 0700 In: 2041.6 [P.O.:240; I.V.:881.1; IV Piggyback:920.4] Out: 400 [Urine:400] Intake/Output this shift: No intake/output data recorded.  PE: Gen:  Alert, NAD, pleasant HEENT: EOM's intact, pupils equal and round, visualized portion of throat appears normal Pulm:  rate and effort normal Chest: right chest port without infection. Right breast with some faint erythema laterally, no fluctuance Abd: Soft, NT/ND Skin: warm and dry  Lab Results:  Recent Labs    01/07/21 0418 01/08/21 0531  WBC 10.1 16.5*  HGB 10.7* 9.9*  HCT 32.4* 30.4*  PLT 209 206   BMET Recent Labs    01/07/21 0418 01/08/21 0531  NA 131* 134*  K 3.3* 3.5  CL 99 103  CO2 27 26  GLUCOSE 227* 181*  BUN 10 6*  CREATININE 0.58 0.52  CALCIUM 8.0* 8.2*   PT/INR No results for input(s): LABPROT, INR in the last 72 hours. CMP     Component Value Date/Time   NA 134 (L) 01/08/2021 0531   K 3.5 01/08/2021 0531   CL 103 01/08/2021 0531   CO2 26 01/08/2021 0531   GLUCOSE 181 (H) 01/08/2021 0531   BUN 6 (L) 01/08/2021 0531   CREATININE 0.52 01/08/2021  0531   CREATININE 0.76 12/31/2020 0750   CALCIUM 8.2 (L) 01/08/2021 0531   PROT 6.2 (L) 01/07/2021 0418   ALBUMIN 2.9 (L) 01/08/2021 0531   AST 21 01/07/2021 0418   AST 24 12/31/2020 0750   ALT 25 01/07/2021 0418   ALT 25 12/31/2020 0750   ALKPHOS 70 01/07/2021 0418   BILITOT 0.6 01/07/2021 0418   BILITOT 0.3 12/31/2020 0750   GFRNONAA >60 01/08/2021 0531   GFRNONAA >60 12/31/2020 0750   Lipase     Component Value Date/Time   LIPASE 20 01/06/2021 1840       Studies/Results: CT Angio Chest PE W and/or Wo Contrast  Result Date: 01/06/2021 CLINICAL DATA:  Shortness of breath. Abdominal infection, fever and pain. EXAM: CT ANGIOGRAPHY CHEST CT ABDOMEN AND PELVIS WITH CONTRAST TECHNIQUE: Multidetector CT imaging of the chest was performed using the standard protocol during bolus administration of intravenous contrast. Multiplanar CT image reconstructions and MIPs were obtained to evaluate the vascular anatomy. Multidetector CT imaging of the abdomen and pelvis was performed using the standard protocol during bolus administration of intravenous contrast. CONTRAST:  36mL OMNIPAQUE IOHEXOL 350 MG/ML SOLN COMPARISON:  None. FINDINGS: CTA CHEST FINDINGS Cardiovascular: Satisfactory opacification of the pulmonary arteries to the segmental level. No evidence of pulmonary embolism. The heart is borderline enlarged. There is no pericardial effusion. Aorta is normal in size. There are atherosclerotic calcifications of  the aorta. Chest port catheter tip ends in the SVC. Mediastinum/Nodes: No enlarged mediastinal, hilar, or axillary lymph nodes. Thyroid gland, trachea, and esophagus demonstrate no significant findings. Lungs/Pleura: There is mild atelectasis in the bilateral lower lobes. The lungs otherwise appear clear. There is no pleural effusion or pneumothorax. Trachea and central airways are patent. Musculoskeletal: No acute fracture. Right sided chest port is present. There is marked subcutaneous  edema in the anterior right chest wall surrounding the port. There is some soft tissue air in the lower right neck, likely related to port placement, although infection is not excluded. There are 2 fluid collections within the right breast with mild surrounding stranding. One is located in the lateral right breast measuring 5.1 by 3.1 by 4.8 cm. The other is located in the superior central breast measuring 4.2 by 1.5 by 2.5 cm. Review of the MIP images confirms the above findings. CT ABDOMEN and PELVIS FINDINGS Hepatobiliary: The gallbladder surgically absent. No focal liver lesions are identified. There is no biliary ductal dilatation. Pancreas: There is a fat attenuation lesion in the neck of the pancreas measuring 10 x 17 mm compatible with lipoma. Pancreas otherwise appears within normal limits. Spleen: Normal in size without focal abnormality. Adrenals/Urinary Tract: Adrenal glands are unremarkable. Kidneys are normal, without renal calculi, focal lesion, or hydronephrosis. Bladder is unremarkable. Stomach/Bowel: There is no evidence for bowel obstruction or free air. The appendix is within normal limits. There is sigmoid colon diverticulosis. There is some mild inflammatory stranding surrounding diverticula in the sigmoid colon compatible with acute diverticulitis. There is no evidence for perforation or abscess. Small bowel and stomach are within normal limits. Vascular/Lymphatic: Aortic atherosclerosis. No enlarged abdominal or pelvic lymph nodes. Reproductive: Status post hysterectomy. No adnexal masses. Other: No abdominal wall hernia or abnormality. No abdominopelvic ascites. Musculoskeletal: Degenerative changes affect the spine. Review of the MIP images confirms the above findings. IMPRESSION: 1. No evidence for pulmonary embolism. 2. Right chest port in place. There is significant subcutaneous stranding surrounding the right chest port with some soft tissue air in the lower right neck. Findings may be  related to recent port placement; however, soft tissue infection is not excluded. 3. There are 2 right breast fluid collections, indeterminate. The largest measures up to 5.1 cm. 4. Acute uncomplicated sigmoid colon diverticulitis. 5. Pancreatic lipoma. Electronically Signed   By: Ronney Asters M.D.   On: 01/06/2021 20:58   CT ABDOMEN PELVIS W CONTRAST  Result Date: 01/06/2021 CLINICAL DATA:  Shortness of breath. Abdominal infection, fever and pain. EXAM: CT ANGIOGRAPHY CHEST CT ABDOMEN AND PELVIS WITH CONTRAST TECHNIQUE: Multidetector CT imaging of the chest was performed using the standard protocol during bolus administration of intravenous contrast. Multiplanar CT image reconstructions and MIPs were obtained to evaluate the vascular anatomy. Multidetector CT imaging of the abdomen and pelvis was performed using the standard protocol during bolus administration of intravenous contrast. CONTRAST:  41mL OMNIPAQUE IOHEXOL 350 MG/ML SOLN COMPARISON:  None. FINDINGS: CTA CHEST FINDINGS Cardiovascular: Satisfactory opacification of the pulmonary arteries to the segmental level. No evidence of pulmonary embolism. The heart is borderline enlarged. There is no pericardial effusion. Aorta is normal in size. There are atherosclerotic calcifications of the aorta. Chest port catheter tip ends in the SVC. Mediastinum/Nodes: No enlarged mediastinal, hilar, or axillary lymph nodes. Thyroid gland, trachea, and esophagus demonstrate no significant findings. Lungs/Pleura: There is mild atelectasis in the bilateral lower lobes. The lungs otherwise appear clear. There is no pleural effusion or pneumothorax. Trachea  and central airways are patent. Musculoskeletal: No acute fracture. Right sided chest port is present. There is marked subcutaneous edema in the anterior right chest wall surrounding the port. There is some soft tissue air in the lower right neck, likely related to port placement, although infection is not excluded.  There are 2 fluid collections within the right breast with mild surrounding stranding. One is located in the lateral right breast measuring 5.1 by 3.1 by 4.8 cm. The other is located in the superior central breast measuring 4.2 by 1.5 by 2.5 cm. Review of the MIP images confirms the above findings. CT ABDOMEN and PELVIS FINDINGS Hepatobiliary: The gallbladder surgically absent. No focal liver lesions are identified. There is no biliary ductal dilatation. Pancreas: There is a fat attenuation lesion in the neck of the pancreas measuring 10 x 17 mm compatible with lipoma. Pancreas otherwise appears within normal limits. Spleen: Normal in size without focal abnormality. Adrenals/Urinary Tract: Adrenal glands are unremarkable. Kidneys are normal, without renal calculi, focal lesion, or hydronephrosis. Bladder is unremarkable. Stomach/Bowel: There is no evidence for bowel obstruction or free air. The appendix is within normal limits. There is sigmoid colon diverticulosis. There is some mild inflammatory stranding surrounding diverticula in the sigmoid colon compatible with acute diverticulitis. There is no evidence for perforation or abscess. Small bowel and stomach are within normal limits. Vascular/Lymphatic: Aortic atherosclerosis. No enlarged abdominal or pelvic lymph nodes. Reproductive: Status post hysterectomy. No adnexal masses. Other: No abdominal wall hernia or abnormality. No abdominopelvic ascites. Musculoskeletal: Degenerative changes affect the spine. Review of the MIP images confirms the above findings. IMPRESSION: 1. No evidence for pulmonary embolism. 2. Right chest port in place. There is significant subcutaneous stranding surrounding the right chest port with some soft tissue air in the lower right neck. Findings may be related to recent port placement; however, soft tissue infection is not excluded. 3. There are 2 right breast fluid collections, indeterminate. The largest measures up to 5.1 cm. 4.  Acute uncomplicated sigmoid colon diverticulitis. 5. Pancreatic lipoma. Electronically Signed   By: Ronney Asters M.D.   On: 01/06/2021 20:58   DG Chest Port 1 View  Result Date: 01/06/2021 CLINICAL DATA:  Hypoxia and fevers EXAM: PORTABLE CHEST 1 VIEW COMPARISON:  12/30/2020 FINDINGS: Stable right-sided chest wall port is noted with catheter tip in the mid superior vena cava. No pneumothorax is noted. Cardiac shadow is within normal limits. The lungs are well aerated without focal infiltrate or effusion. No bony abnormality is noted. IMPRESSION: No acute abnormality seen. Electronically Signed   By: Inez Catalina M.D.   On: 01/06/2021 19:26   US BREAST ASPIRATION RIGHT  Result Date: 01/07/2021 INDICATION: Breast cancer history status post recent RIGHT lumpectomy and axillary biopsy. Febrile, infectious workup. EXAM: US-GUIDED RIGHT AXILLARY COLLECTION ASPIRATION COMPARISON:  CT chest, 01/06/2021.  OR fluoroscopy, 12/30/2020. MEDICATIONS: The patient is currently admitted to the hospital and receiving intravenous antibiotics. The antibiotics were administered within an appropriate time frame prior to the initiation of the procedure. ANESTHESIA/SEDATION: Local anesthetic was administered. CONTRAST:  None COMPLICATIONS: None immediate. PROCEDURE: Informed written consent was obtained from the patient after a discussion of the risks, benefits and alternatives to treatment. Preprocedural ultrasound scanning demonstrated well circumscribed, round RIGHT axillary anechoic collection measuring approximately 5.2 x 4.5 cm. A timeout was performed prior to the initiation of the procedure. The RIGHT axilla was prepped and draped in the usual sterile fashion. The overlying soft tissues were anesthetized with 1% lidocaine with epinephrine.  Under direct ultrasound guidance, a 18 gauge trocar needle was advanced into the abscess/fluid collection. Multiple ultrasound images were saved for procedural documentation purposes.  Next, approximately 40 mL of serosanguineous fluid was aspirated from the collection. A representative sample of aspirated fluid was capped and sent to the laboratory for analysis. The needle was removed and superficial hemostasis was achieved with manual compression. A dressing was placed. The patient tolerated the procedure well without immediate postprocedural complication. IMPRESSION: Successful US guided of 40 mL of serosanguineous fluid from RIGHT axillary fluid collection. A representative aspirated sample was sent to the laboratory as requested by the ordering clinical team. Michaelle Birks, MD [facility] Electronically Signed   By: Michaelle Birks M.D.   On: 01/07/2021 13:56    Anti-infectives: Anti-infectives (From admission, onward)    Start     Dose/Rate Route Frequency Ordered Stop   01/07/21 2200  vancomycin (VANCOREADY) IVPB 1250 mg/250 mL        1,250 mg 166.7 mL/hr over 90 Minutes Intravenous Every 24 hours 01/07/21 0030     01/07/21 0400  ceFEPIme (MAXIPIME) 2 g in sodium chloride 0.9 % 100 mL IVPB        2 g 200 mL/hr over 30 Minutes Intravenous Every 8 hours 01/07/21 0030     01/07/21 0030  ceFEPIme (MAXIPIME) 2 g in sodium chloride 0.9 % 100 mL IVPB  Status:  Discontinued        2 g 200 mL/hr over 30 Minutes Intravenous  Once 01/07/21 0016 01/07/21 0021   01/07/21 0030  metroNIDAZOLE (FLAGYL) IVPB 500 mg        500 mg 100 mL/hr over 60 Minutes Intravenous Every 12 hours 01/07/21 0016 01/14/21 0029   01/07/21 0030  vancomycin (VANCOCIN) IVPB 1000 mg/200 mL premix  Status:  Discontinued        1,000 mg 200 mL/hr over 60 Minutes Intravenous  Once 01/07/21 0016 01/07/21 0021   01/06/21 1930  vancomycin (VANCOCIN) IVPB 1000 mg/200 mL premix        1,000 mg 200 mL/hr over 60 Minutes Intravenous  Once 01/06/21 1916 01/06/21 2238   01/06/21 1930  ceFEPIme (MAXIPIME) 2 g in sodium chloride 0.9 % 100 mL IVPB        2 g 200  mL/hr over 30 Minutes Intravenous  Once 01/06/21 1921 01/06/21 2054        Assessment/Plan Fever on chemotherapy Breast cancer - port placed 12/30/20 does not appear infected - s/p right breast fluid collection aspiration 10/18 >> culture pending and gram stain with gram positive cocci. Continue broad spectrum antibiotics  Sigmoid diverticulitis - Continue diet as tolerated and antibiotics. Add probiotic  ID - cefepime, flagyl, vancomycin. WBC up 16.5 but afebrile x24 hours, continue to monitor FEN - CM diet VTE - sq heparin Foley - none  HTN HLD DM   LOS: 2 days    Wellington Hampshire, [facility] 01/08/2021, 8:59 AM Please see Amion for pager number during day hours 7:00am-4:30pm

## 2021-01-08 NOTE — Progress Notes (Signed)
[name]   DOB:[birth year]   JA#:[id number]   HAL#:[id number]  Subjective:  65 year old [name] woman whom I follow for early stage breast cancer, s/p cycle 1 chemotherapy with cyclophosphamide and docetaxel 12/31/2020, admitted with nausea, vomiting, dysuria, abdominal pain and fever.  Today is her second day in the hospital.  [name] has continued on broad spectrum antibiotics.  She underwent abscess drainage from her breast yesterday that has preliminarily grown gram positive cocci. Her TMAX overnight was 99.5.  [name] notes her main issue is abdominal discomfort.  She says that her belly feels sore, but less distended than yesterday.  She notes it is slowly improving.    [name] feels weaker than prior to her admission.  She is getting up with PT and working on reconditioning for a brief period this afternoon.     Objective: White woman examined in bed Vitals:   01/07/21 2108 01/08/21 0515  BP: (!) 137/56 122/75  Pulse: 96 90  Resp: 18 16  Temp: 99.5 F (37.5 C) 98.8 F (37.1 C)  SpO2: 97% 92%    Body mass index is 31.87 kg/m.  Intake/Output Summary (Last 24 hours) at 01/08/2021 1341 Last data filed at 01/08/2021 1005 Gross per 24 hour  Intake 2021.55 ml  Output --  Net 2021.55 ml     Sclerae unicteric  Lungs no rales or wheezes  Heart regular rate and rhythm  Abdomen soft, +BS, mildly tender diffusely without rebound  Neuro nonfocal  Skin: Mild tenderness associated with the port but no erythema or fluctuance  CBG (last 3)  Recent Labs    01/08/21 0200 01/08/21 0745 01/08/21 1244  GLUCAP 239* 165* 221*     Labs:  Lab Results  Component Value Date   WBC 16.5 (H) 01/08/2021   HGB 9.9 (L) 01/08/2021   HCT 30.4 (L) 01/08/2021   MCV 91.0 01/08/2021   PLT 206 01/08/2021   NEUTROABS 9.8 (H) 01/08/2021    _0 @  Urine Studies No results for input(s): UHGB, CRYS in the last 72 hours.  Invalid input(s): UACOL, UAPR, USPG, UPH, UTP, UGL, UKET, UBIL,  UNIT, UROB, ULEU, UEPI, UWBC, URBC, UBAC, CAST, [place], [place]  Basic Metabolic Panel: Recent Labs  Lab 01/06/21 1840 01/07/21 0039 01/07/21 0418 01/08/21 0531  NA 128*  --  131* 134*  K 3.7  --  3.3* 3.5  CL 94*  --  99 103  CO2 22  --  27 26  GLUCOSE 301*  --  227* 181*  BUN 10  --  10 6*  CREATININE 0.61 0.79 0.58 0.52  CALCIUM 8.6*  --  8.0* 8.2*  MG  --   --  1.5* 1.9  PHOS  --   --   --  2.5   GFR Estimated Creatinine Clearance: 81.8 mL/min (by C-G formula based on SCr of 0.52 mg/dL). Liver Function Tests: Recent Labs  Lab 01/06/21 1840 01/07/21 0418 01/08/21 0531  AST 29 21  --   ALT 36 25  --   ALKPHOS 72 70  --   BILITOT 0.6 0.6  --   PROT 7.4 6.2*  --   ALBUMIN 3.8 3.1* 2.9*   Recent Labs  Lab 01/06/21 1840  LIPASE 20   No results for input(s): AMMONIA in the last 168 hours. Coagulation profile No results for input(s): INR, PROTIME in the last 168 hours.  CBC: Recent Labs  Lab 01/06/21 1840 01/07/21 0039 01/07/21 0418 01/08/21 0531  WBC 6.9 10.3 10.1 16.5*  NEUTROABS  --   --  5.8 9.8*  HGB 12.7 11.8* 10.7* 9.9*  HCT 38.6 36.4 32.4* 30.4*  MCV 89.4 91.5 89.0 91.0  PLT 255 230 209 206   Cardiac Enzymes: No results for input(s): CKTOTAL, CKMB, CKMBINDEX, TROPONINI in the last 168 hours. BNP: Invalid input(s): POCBNP CBG: Recent Labs  Lab 01/07/21 1657 01/07/21 2108 01/08/21 0200 01/08/21 0745 01/08/21 1244  GLUCAP 274* 295* 239* 165* 221*   D-Dimer No results for input(s): DDIMER in the last 72 hours. Hgb A1c No results for input(s): HGBA1C in the last 72 hours. Lipid Profile No results for input(s): CHOL, HDL, LDLCALC, TRIG, CHOLHDL, LDLDIRECT in the last 72 hours. Thyroid function studies No results for input(s): TSH, T4TOTAL, T3FREE, THYROIDAB in the last 72 hours.  Invalid input(s): FREET3 Anemia work up No results for input(s): VITAMINB12, FOLATE, FERRITIN, TIBC, IRON, RETICCTPCT in the last 72 hours. Microbiology Recent  Results (from the past 240 hour(s))  Resp Panel by RT-PCR (Flu A&B, Covid) Nasopharyngeal Swab     Status: None   Collection Time: 01/06/21  6:12 PM   Specimen: Nasopharyngeal Swab; Nasopharyngeal(NP) swabs in vial transport medium  Result Value Ref Range Status   SARS Coronavirus 2 by RT PCR NEGATIVE NEGATIVE Final    Comment: (NOTE) SARS-CoV-2 target nucleic acids are NOT DETECTED.  The SARS-CoV-2 RNA is generally detectable in upper respiratory specimens during the acute phase of infection. The lowest concentration of SARS-CoV-2 viral copies this assay can detect is 138 copies/mL. A negative result does not preclude SARS-Cov-2 infection and should not be used as the sole basis for treatment or other patient management decisions. A negative result may occur with  improper specimen collection/handling, submission of specimen other than nasopharyngeal swab, presence of viral mutation(s) within the areas targeted by this assay, and inadequate number of viral copies(<138 copies/mL). A negative result must be combined with clinical observations, patient history, and epidemiological information. The expected result is Negative.  Fact Sheet for Patients:  EntrepreneurPulse.com.au  Fact Sheet for Healthcare Providers:  IncredibleEmployment.be  This test is no t yet approved or cleared by the [place] FDA and  has been authorized for detection and/or diagnosis of SARS-CoV-2 by FDA under an Emergency Use Authorization (EUA). This EUA will remain  in effect (meaning this test can be used) for the duration of the COVID-19 declaration under Section 564(b)(1) of the Act, 21 U.S.C.section 360bbb-3(b)(1), unless the authorization is terminated  or revoked sooner.       Influenza A by PCR NEGATIVE NEGATIVE Final   Influenza B by PCR NEGATIVE NEGATIVE Final    Comment: (NOTE) The Xpert Xpress SARS-CoV-2/FLU/RSV plus assay is intended as an aid in the  diagnosis of influenza from Nasopharyngeal swab specimens and should not be used as a sole basis for treatment. Nasal washings and aspirates are unacceptable for Xpert Xpress SARS-CoV-2/FLU/RSV testing.  Fact Sheet for Patients: EntrepreneurPulse.com.au  Fact Sheet for Healthcare Providers: IncredibleEmployment.be  This test is not yet approved or cleared by the [place] FDA and has been authorized for detection and/or diagnosis of SARS-CoV-2 by FDA under an Emergency Use Authorization (EUA). This EUA will remain in effect (meaning this test can be used) for the duration of the COVID-19 declaration under Section 564(b)(1) of the Act, 21 U.S.C. section 360bbb-3(b)(1), unless the authorization is terminated or revoked.  Performed at [facility], [place] [address], [place], [place] [address]   Blood culture (routine x 2)  Status: None (Preliminary result)   Collection Time: 01/06/21  6:40 PM   Specimen: BLOOD  Result Value Ref Range Status   Specimen Description   Final    BLOOD RIGHT ANTECUBITAL Performed at [place] [address], [place], [place] [address]    Special Requests   Final    BOTTLES DRAWN AEROBIC AND ANAEROBIC Blood Culture results may not be optimal due to an inadequate volume of blood received in culture bottles Performed at [place] [address], [place], [place] [address]    Culture   Final    NO GROWTH 2 DAYS Performed at [place] [address] [place] [address], [place], [place] [address]    Report Status PENDING  Incomplete  Urine Culture     Status: Abnormal (Preliminary result)   Collection Time: 01/06/21  9:45 PM   Specimen: Urine, Clean Catch  Result Value Ref Range Status   Specimen Description   Final    URINE, CLEAN CATCH Performed at [facility], [place] [address] [place], [place] [address]    Special Requests   Final     NONE Performed at [facility], [place] [address], [place], [place] [address]    Culture (A)  Final    20,000 COLONIES/mL ESCHERICHIA COLI SUSCEPTIBILITIES TO FOLLOW 80,000 COLONIES/mL LACTOBACILLUS SPECIES Standardized susceptibility testing for this organism is not available. Performed at [facility], [place] [address] [place], [place] [address]    Report Status PENDING  Incomplete  Blood culture (routine x 2)     Status: None (Preliminary result)   Collection Time: 01/07/21 12:39 AM   Specimen: Right Antecubital; Blood  Result Value Ref Range Status   Specimen Description   Final    RIGHT ANTECUBITAL BLOOD Performed at [facility], [place] [address], [place], [place] [address]    Special Requests   Final    BOTTLES DRAWN AEROBIC AND ANAEROBIC Blood Culture adequate volume Performed at [place] [address] [place], [place] [address]    Culture   Final    NO GROWTH 1 DAY Performed at [facility], [place] [address] [place], [place] [address]    Report Status PENDING  Incomplete  Aerobic/Anaerobic Culture w Gram Stain (surgical/deep wound)     Status: None (Preliminary result)   Collection Time: 01/07/21  1:42 PM   Specimen: Abscess  Result Value Ref Range Status   Specimen Description   Final    ABSCESS RIGHT AXILLA Performed at [place] [address] [place], [place] [address]    Special Requests   Final    NONE Performed at [facility], [place] [address] [place], [place] [address]    Gram Stain   Final    FEW WBC PRESENT,BOTH PMN AND MONONUCLEAR MODERATE GRAM POSITIVE COCCI    Culture   Final    FEW STREPTOCOCCUS AGALACTIAE TESTING AGAINST S. AGALACTIAE NOT ROUTINELY PERFORMED DUE TO PREDICTABILITY OF AMP/PEN/VAN SUSCEPTIBILITY. Performed at [facility], [place] [address] [place], [place] [address]    Report Status PENDING  Incomplete      Studies:  CT  Angio Chest PE W and/or Wo Contrast  Result Date: 01/06/2021 CLINICAL DATA:  Shortness of breath. Abdominal infection, fever and pain. EXAM: CT ANGIOGRAPHY CHEST CT ABDOMEN AND PELVIS WITH CONTRAST TECHNIQUE: Multidetector CT imaging of the chest was performed using the standard protocol during bolus  administration of intravenous contrast. Multiplanar CT image reconstructions and MIPs were obtained to evaluate the vascular anatomy. Multidetector CT imaging of the abdomen and pelvis was performed using the standard protocol during bolus administration of intravenous contrast. CONTRAST:  77m OMNIPAQUE IOHEXOL 350 MG/ML SOLN COMPARISON:  None. FINDINGS: CTA CHEST FINDINGS Cardiovascular: Satisfactory opacification of the pulmonary arteries to the segmental level. No evidence of pulmonary embolism. The heart is borderline enlarged. There is no pericardial effusion. Aorta is normal in size. There are atherosclerotic calcifications of the aorta. Chest port catheter tip ends in the SVC. Mediastinum/Nodes: No enlarged mediastinal, hilar, or axillary lymph nodes. Thyroid gland, trachea, and esophagus demonstrate no significant findings. Lungs/Pleura: There is mild atelectasis in the bilateral lower lobes. The lungs otherwise appear clear. There is no pleural effusion or pneumothorax. Trachea and central airways are patent. Musculoskeletal: No acute fracture. Right sided chest port is present. There is marked subcutaneous edema in the anterior right chest wall surrounding the port. There is some soft tissue air in the lower right neck, likely related to port placement, although infection is not excluded. There are 2 fluid collections within the right breast with mild surrounding stranding. One is located in the lateral right breast measuring 5.1 by 3.1 by 4.8 cm. The other is located in the superior central breast measuring 4.2 by 1.5 by 2.5 cm. Review of the MIP images confirms the above findings. CT ABDOMEN and PELVIS  FINDINGS Hepatobiliary: The gallbladder surgically absent. No focal liver lesions are identified. There is no biliary ductal dilatation. Pancreas: There is a fat attenuation lesion in the neck of the pancreas measuring 10 x 17 mm compatible with lipoma. Pancreas otherwise appears within normal limits. Spleen: Normal in size without focal abnormality. Adrenals/Urinary Tract: Adrenal glands are unremarkable. Kidneys are normal, without renal calculi, focal lesion, or hydronephrosis. Bladder is unremarkable. Stomach/Bowel: There is no evidence for bowel obstruction or free air. The appendix is within normal limits. There is sigmoid colon diverticulosis. There is some mild inflammatory stranding surrounding diverticula in the sigmoid colon compatible with acute diverticulitis. There is no evidence for perforation or abscess. Small bowel and stomach are within normal limits. Vascular/Lymphatic: Aortic atherosclerosis. No enlarged abdominal or pelvic lymph nodes. Reproductive: Status post hysterectomy. No adnexal masses. Other: No abdominal wall hernia or abnormality. No abdominopelvic ascites. Musculoskeletal: Degenerative changes affect the spine. Review of the MIP images confirms the above findings. IMPRESSION: 1. No evidence for pulmonary embolism. 2. Right chest port in place. There is significant subcutaneous stranding surrounding the right chest port with some soft tissue air in the lower right neck. Findings may be related to recent port placement; however, soft tissue infection is not excluded. 3. There are 2 right breast fluid collections, indeterminate. The largest measures up to 5.1 cm. 4. Acute uncomplicated sigmoid colon diverticulitis. 5. Pancreatic lipoma. Electronically Signed   By: ARonney AstersM.D.   On: 01/06/2021 20:58   CT ABDOMEN PELVIS W CONTRAST  Result Date: 01/06/2021 CLINICAL DATA:  Shortness of breath. Abdominal infection, fever and pain. EXAM: CT ANGIOGRAPHY CHEST CT ABDOMEN AND PELVIS  WITH CONTRAST TECHNIQUE: Multidetector CT imaging of the chest was performed using the standard protocol during bolus administration of intravenous contrast. Multiplanar CT image reconstructions and MIPs were obtained to evaluate the vascular anatomy. Multidetector CT imaging of the abdomen and pelvis was performed using the standard protocol during bolus administration of intravenous contrast. CONTRAST:  881mOMNIPAQUE IOHEXOL 350 MG/ML SOLN COMPARISON:  None. FINDINGS: CTA CHEST  FINDINGS Cardiovascular: Satisfactory opacification of the pulmonary arteries to the segmental level. No evidence of pulmonary embolism. The heart is borderline enlarged. There is no pericardial effusion. Aorta is normal in size. There are atherosclerotic calcifications of the aorta. Chest port catheter tip ends in the SVC. Mediastinum/Nodes: No enlarged mediastinal, hilar, or axillary lymph nodes. Thyroid gland, trachea, and esophagus demonstrate no significant findings. Lungs/Pleura: There is mild atelectasis in the bilateral lower lobes. The lungs otherwise appear clear. There is no pleural effusion or pneumothorax. Trachea and central airways are patent. Musculoskeletal: No acute fracture. Right sided chest port is present. There is marked subcutaneous edema in the anterior right chest wall surrounding the port. There is some soft tissue air in the lower right neck, likely related to port placement, although infection is not excluded. There are 2 fluid collections within the right breast with mild surrounding stranding. One is located in the lateral right breast measuring 5.1 by 3.1 by 4.8 cm. The other is located in the superior central breast measuring 4.2 by 1.5 by 2.5 cm. Review of the MIP images confirms the above findings. CT ABDOMEN and PELVIS FINDINGS Hepatobiliary: The gallbladder surgically absent. No focal liver lesions are identified. There is no biliary ductal dilatation. Pancreas: There is a fat attenuation lesion in the  neck of the pancreas measuring 10 x 17 mm compatible with lipoma. Pancreas otherwise appears within normal limits. Spleen: Normal in size without focal abnormality. Adrenals/Urinary Tract: Adrenal glands are unremarkable. Kidneys are normal, without renal calculi, focal lesion, or hydronephrosis. Bladder is unremarkable. Stomach/Bowel: There is no evidence for bowel obstruction or free air. The appendix is within normal limits. There is sigmoid colon diverticulosis. There is some mild inflammatory stranding surrounding diverticula in the sigmoid colon compatible with acute diverticulitis. There is no evidence for perforation or abscess. Small bowel and stomach are within normal limits. Vascular/Lymphatic: Aortic atherosclerosis. No enlarged abdominal or pelvic lymph nodes. Reproductive: Status post hysterectomy. No adnexal masses. Other: No abdominal wall hernia or abnormality. No abdominopelvic ascites. Musculoskeletal: Degenerative changes affect the spine. Review of the MIP images confirms the above findings. IMPRESSION: 1. No evidence for pulmonary embolism. 2. Right chest port in place. There is significant subcutaneous stranding surrounding the right chest port with some soft tissue air in the lower right neck. Findings may be related to recent port placement; however, soft tissue infection is not excluded. 3. There are 2 right breast fluid collections, indeterminate. The largest measures up to 5.1 cm. 4. Acute uncomplicated sigmoid colon diverticulitis. 5. Pancreatic lipoma. Electronically Signed   By: Ronney Asters M.D.   On: 01/06/2021 20:58   DG Chest Port 1 View  Result Date: 01/06/2021 CLINICAL DATA:  Hypoxia and fevers EXAM: PORTABLE CHEST 1 VIEW COMPARISON:  12/30/2020 FINDINGS: Stable right-sided chest wall port is noted with catheter tip in the mid superior vena cava. No pneumothorax is noted. Cardiac shadow is within normal limits. The lungs are well aerated without focal infiltrate or  effusion. No bony abnormality is noted. IMPRESSION: No acute abnormality seen. Electronically Signed   By: Inez Catalina M.D.   On: 01/06/2021 19:26   US BREAST ASPIRATION RIGHT  Result Date: 01/07/2021 INDICATION: Breast cancer history status post recent RIGHT lumpectomy and axillary biopsy. Febrile, infectious workup. EXAM: US-GUIDED RIGHT AXILLARY COLLECTION ASPIRATION COMPARISON:  CT chest, 01/06/2021.  OR fluoroscopy, 12/30/2020. MEDICATIONS: The patient is currently admitted to the hospital and receiving intravenous antibiotics. The antibiotics were administered within an appropriate time frame prior  to the initiation of the procedure. ANESTHESIA/SEDATION: Local anesthetic was administered. CONTRAST:  None COMPLICATIONS: None immediate. PROCEDURE: Informed written consent was obtained from the patient after a discussion of the risks, benefits and alternatives to treatment. Preprocedural ultrasound scanning demonstrated well circumscribed, round RIGHT axillary anechoic collection measuring approximately 5.2 x 4.5 cm. A timeout was performed prior to the initiation of the procedure. The RIGHT axilla was prepped and draped in the usual sterile fashion. The overlying soft tissues were anesthetized with 1% lidocaine with epinephrine. Under direct ultrasound guidance, a 18 gauge trocar needle was advanced into the abscess/fluid collection. Multiple ultrasound images were saved for procedural documentation purposes. Next, approximately 40 mL of serosanguineous fluid was aspirated from the collection. A representative sample of aspirated fluid was capped and sent to the laboratory for analysis. The needle was removed and superficial hemostasis was achieved with manual compression. A dressing was placed. The patient tolerated the procedure well without immediate postprocedural complication. IMPRESSION: Successful US guided of 40 mL of serosanguineous fluid from RIGHT axillary fluid collection. A representative  aspirated sample was sent to the laboratory as requested by the ordering clinical team. Michaelle Birks, MD [facility] Electronically Signed   By: Michaelle Birks M.D.   On: 01/07/2021 13:56    Assessment: 66 y.o.[place] woman status post right breast upper inner quadrant biopsy 10/04/2020 for clinical T1b N0, stage IA invasive lobular carcinoma, E-cadherin negative, grade 1, estrogen receptor positive, progesterone receptor and HER2 negative, with an MIB-1 of 10%; admitted 01/06/2021 with fever, nausea, vomiting, dysuria and abdominal discomfort  (A) CT angio chest shows no pulmonary embolus, stranding associated with port, 2 right breast fluid collections   (B) CT abdomen and pelvis shows sigmoid diverticulitis  (1) status post right lumpectomy and sentinel lymph node sampling 11/05/2020 for a PT1b pN0, stage IA invasive lobular carcinoma, grade 2   (2) Oncotype score of 32 predicts a risk of recurrence outside the breast within the next 9 years of 20% if the patient's only systemic therapy is antiestrogens for 5 years.  It also predicts significant benefit from chemotherapy.   (3) adjuvant chemotherapy with cyclophosphamide and docetaxel every 21 days x 4 to start 12/31/2020   (4) adjuvant radiation to follow   (5) antiestrogens to start at the completion of local treatment.    Plan:  Ciel is now day 9 cycle 1 of cyclophosphamide and docetaxel.  She received PEG fill gastrim on 11/13.  She is not neutropenic.  [place] is slowly improving today and continues on antibiotics.  Culture + for Streptococcus Agalactiae.  She is improving on her iv antibiotics.    She and I discussed her continuing to work with PT and regaining her strength.  Her abdomen is still slightly tender and I counseled her on using her incentive spirometer regularly to prevent atelectasis.    She has appt on 11/1 for f/u with [place] and to adjust her chemotherapy  regimen to avoid another hospitalization.  She understands this.    We appreciate the great care the hospitalist team has provided Omayra and will continue to follow with you.    Wilber Bihari, NP 01/08/21 1:41 PM Medical Oncology and Hematology [facility] [place], [place] [address] Tel. [phone]    Fax. [phone]

## 2021-01-08 NOTE — TOC Initial Note (Signed)
Transition of Care [facility]) - Initial/Assessment Note    Patient Details  Name: [name] MRN: [id number] Date of Birth: November 30, 1955  Transition of Care [facility]) CM/SW Contact:    Kimberly Catalan, RN Phone Number: 01/08/2021, 1:47 PM  Clinical Narrative:                 Spoke with pt and husband at bedside for dc planning. Pt declines home health services at this time. She does request Rolator for home. Order received and [employer] contacted for Rolator.  Expected Discharge Plan: Home/Self Care Barriers to Discharge: Continued Medical Work up   Patient Goals and CMS Choice Patient states their goals for this hospitalization and ongoing recovery are:: To get home      Expected Discharge Plan and Services Expected Discharge Plan: Home/Self Care   Discharge Planning Services: CM Consult   Living arrangements for the past 2 months: Single Family Home                 DME Arranged: Walker rolling with seat DME Agency: [employer] Date DME Agency Contacted: 01/08/21 Time DME Agency Contacted: [phone]              Prior Living Arrangements/Services Living arrangements for the past 2 months: Single Family Home Lives with:: Spouse Patient language and need for interpreter reviewed:: Yes        Need for Family Participation in Patient Care: Yes (Comment) Care giver support system in place?: Yes (comment)   Criminal Activity/Legal Involvement Pertinent to Current Situation/Hospitalization: No - Comment as needed  Activities of Daily Living Home Assistive Devices/Equipment: Eyeglasses ADL Screening (condition at time of admission) Patient's cognitive ability adequate to safely complete daily activities?: Yes Is the patient deaf or have difficulty hearing?: No Does the patient have difficulty seeing, even when wearing glasses/contacts?: No Does the patient have difficulty concentrating, remembering, or making decisions?: No Patient able to express need for assistance with  ADLs?: Yes Does the patient have difficulty dressing or bathing?: No Independently performs ADLs?: Yes (appropriate for developmental age) Does the patient have difficulty walking or climbing stairs?: No Weakness of Legs: Both Weakness of Arms/Hands: Both  Permission Sought/Granted                  Emotional Assessment Appearance:: Appears stated age Attitude/Demeanor/Rapport: Gracious Affect (typically observed): Calm Orientation: : Oriented to Self, Oriented to Place, Oriented to  Time, Oriented to Situation Alcohol / Substance Use: Not Applicable Psych Involvement: No (comment)  Admission diagnosis:  Diverticulitis [K57.92] Hypoxia [R09.02] SIRS (systemic inflammatory response syndrome) ([place]) [R65.10] Patient Active Problem List   Diagnosis Date Noted   Uncontrolled type 2 diabetes mellitus with hyperglycemia ([place]) 01/07/2021   SIRS (systemic inflammatory response syndrome) ([place]) 01/06/2021   Malignant neoplasm of upper-inner quadrant of right breast in female, estrogen receptor positive ([place]) 10/14/2020   PCP:  Antony Contras, MD Pharmacy:   [id number] - [place], [place] [address] [place] [address] Phone: [phone] Fax: [phone]     Social Determinants of Health (SDOH) Interventions    Readmission Risk Interventions Readmission Risk Prevention Plan 01/08/2021  Transportation Screening Complete  PCP or Specialist Appt within 5-7 Days Complete  Home Care Screening Complete  Medication Review (RN CM) Complete  Some recent data might be hidden

## 2021-01-08 NOTE — Progress Notes (Signed)
PROGRESS NOTE    [name]  FMB:[id number] DOB: 1955-04-26 DOA: 01/06/2021 PCP: Antony Contras, MD   Chief Complaint  Patient presents with   cancer patient   Emesis   Dysuria   Fever    Brief Narrative:   65 y.o. female with history of breast cancer recently started on chemotherapy about [name] week ago day before that patient had [name] [place]-Cath placed on the right side presents to the ER because of fever chills and weakness.  Patient states after her chemotherapy last week she did well for the next 2 days then she has had some constipation and she took over-the-counter medications for constipation following which she started having multiple episodes of loose stools and dysuria started feeling fever chills weakness and at this point decided to come to the ER.  Has nausea no vomiting and has been having some left lower quadrant abdominal discomfort.   Patient states after her lumpectomy in August she did have some fluid collection and also had been treated with antibiotics for possible infection.   ED Course: In the ER patient was febrile with temperature 102 F tachycardic with lab work showing sodium of 128 blood glucose of 302 anion gap of 12 lactic acid of 2.1 CBC unremarkable COVID test negative.  Patient underwent CT scan of the chest and abdomen pelvis.  Showed features concerning for fluid stranding around the Port-[name] which could be postoperative change or developing infection there is also fluid collection on the right breast area which is also concerning for possible developing infection and also acute sigmoid diverticulitis.  Patient was started on empiric antibiotics fluids admitted for further work-up.  UA is concerning for UTI.     Assessment & Plan:   Principal Problem:   SIRS (systemic inflammatory response syndrome) (HCC) Active Problems:   Malignant neoplasm of upper-inner quadrant of right breast in female, estrogen receptor positive ([place])   Uncontrolled type 2  diabetes mellitus with hyperglycemia ([place])  Sepsis secondary to skin soft tissue infection of right breast and Diverticulitis  Right Breast Abscess  Diverticulitis Fever, tachycardia on presentation in the setting of multiple infectious sources.  Now developed leukocytosis. Concern for infection related to recently placed port (10/10), but based on exam, this seems less likely - general surgery and oncology in agreement Now s/p US guided drainage of 40 ml serosanguinous fluid from right axillary fluid collection Culture showing strep agalactiae Abx narrowed to ceftriaxone/flagyl   Blood cx with ng Urine culture showing e. Coli and lactobacillus, unclear significance  Breast Cancer Following outpatient with Dr. [name], she's s/p lumpectomy/sentinel LN sampling on 8/16  Day 9 cycle 1 cyclophosphamide and docetaxel - s/p pegfilgastrim 11/13 Has follow up 01/21/2021  T2DM Continue basal (45 units) and bolus (4 units TID with meals) along with SSI A1c 8.7 in 11/2020  HTN Losartan (pt notes for renal protection)  Dyslipidemia statin  DVT prophylaxis: heparin Code Status: full  Family Communication: none at bedside Disposition:   Status is: Inpatient  Remains inpatient appropriate because: continued need for IV abx       Consultants:  Surgery IR oncology  Procedures:  IMPRESSION: Successful US guided of 40 mL of serosanguineous fluid from RIGHT axillary fluid collection.   [name] representative aspirated sample was sent to the laboratory as requested by the ordering clinical team.  Antimicrobials: Anti-infectives (From admission, onward)    Start     Dose/Rate Route Frequency Ordered Stop   01/08/21 1200  cefTRIAXone (ROCEPHIN) 2 g in  sodium chloride 0.9 % 100 mL IVPB        2 g 200 mL/hr over 30 Minutes Intravenous Every 24 hours 01/08/21 1118     01/07/21 2200  vancomycin (VANCOREADY) IVPB 1250 mg/250 mL  Status:  Discontinued        1,250 mg 166.7 mL/hr over 90  Minutes Intravenous Every 24 hours 01/07/21 0030 01/08/21 1118   01/07/21 0400  ceFEPIme (MAXIPIME) 2 g in sodium chloride 0.9 % 100 mL IVPB  Status:  Discontinued        2 g 200 mL/hr over 30 Minutes Intravenous Every 8 hours 01/07/21 0030 01/08/21 1118   01/07/21 0030  ceFEPIme (MAXIPIME) 2 g in sodium chloride 0.9 % 100 mL IVPB  Status:  Discontinued        2 g 200 mL/hr over 30 Minutes Intravenous  Once 01/07/21 0016 01/07/21 0021   01/07/21 0030  metroNIDAZOLE (FLAGYL) IVPB 500 mg        500 mg 100 mL/hr over 60 Minutes Intravenous Every 12 hours 01/07/21 0016 01/14/21 0029   01/07/21 0030  vancomycin (VANCOCIN) IVPB 1000 mg/200 mL premix  Status:  Discontinued        1,000 mg 200 mL/hr over 60 Minutes Intravenous  Once 01/07/21 0016 01/07/21 0021   01/06/21 1930  vancomycin (VANCOCIN) IVPB 1000 mg/200 mL premix        1,000 mg 200 mL/hr over 60 Minutes Intravenous  Once 01/06/21 1916 01/06/21 2238   01/06/21 1930  ceFEPIme (MAXIPIME) 2 g in sodium chloride 0.9 % 100 mL IVPB        2 g 200 mL/hr over 30 Minutes Intravenous  Once 01/06/21 1921 01/06/21 2054          Subjective: C/o pain, worsening redness in right axilla  Objective: Vitals:   01/07/21 1500 01/07/21 2108 01/08/21 0515 01/08/21 1426  BP: (!) 145/55 (!) 137/56 122/75 (!) 142/66  Pulse: 98 96 90 85  Resp: 15 18 16 17   Temp: 98.3 F (36.8 C) 99.5 F (37.5 C) 98.8 F (37.1 C) 98.3 F (36.8 C)  TempSrc: Oral Oral Oral Oral  SpO2: 95% 97% 92% 91%  Weight:      Height:        Intake/Output Summary (Last 24 hours) at 01/08/2021 1601 Last data filed at 01/08/2021 1500 Gross per 24 hour  Intake 1290.03 ml  Output --  Net 1290.03 ml   Filed Weights   01/06/21 1718 01/07/21 0026  Weight: 88.5 kg 92.3 kg    Examination:  General exam: Appears calm and comfortable  Respiratory system: unlabored Cardiovascular system:RRR Gastrointestinal system: LLQ ttp  Central nervous system: Alert and oriented. No  focal neurological deficits. Extremities: no LEE Skin: right breast examined with RN present [name]), cellulitis on lateral aspect of breast, spreading to axilla Psychiatry: Judgement and insight appear normal. Mood & affect appropriate.     Data Reviewed: I have personally reviewed following labs and imaging studies  CBC: Recent Labs  Lab 01/06/21 1840 01/07/21 0039 01/07/21 0418 01/08/21 0531  WBC 6.9 10.3 10.1 16.5*  NEUTROABS  --   --  5.8 9.8*  HGB 12.7 11.8* 10.7* 9.9*  HCT 38.6 36.4 32.4* 30.4*  MCV 89.4 91.5 89.0 91.0  PLT 255 230 209 299    Basic Metabolic Panel: Recent Labs  Lab 01/06/21 1840 01/07/21 0039 01/07/21 0418 01/08/21 0531  NA 128*  --  131* 134*  K 3.7  --  3.3* 3.5  CL 94*  --  99 103  CO2 22  --  27 26  GLUCOSE 301*  --  227* 181*  BUN 10  --  10 6*  CREATININE 0.61 0.79 0.58 0.52  CALCIUM 8.6*  --  8.0* 8.2*  MG  --   --  1.5* 1.9  PHOS  --   --   --  2.5    GFR: Estimated Creatinine Clearance: 81.8 mL/min (by C-G formula based on SCr of 0.52 mg/dL).  Liver Function Tests: Recent Labs  Lab 01/06/21 1840 01/07/21 0418 01/08/21 0531  AST 29 21  --   ALT 36 25  --   ALKPHOS 72 70  --   BILITOT 0.6 0.6  --   PROT 7.4 6.2*  --   ALBUMIN 3.8 3.1* 2.9*    CBG: Recent Labs  Lab 01/07/21 1657 01/07/21 2108 01/08/21 0200 01/08/21 0745 01/08/21 1244  GLUCAP 274* 295* 239* 165* 221*     Recent Results (from the past 240 hour(s))  Resp Panel by RT-PCR (Flu Angeletta Goelz&B, Covid) Nasopharyngeal Swab     Status: None   Collection Time: 01/06/21  6:12 PM   Specimen: Nasopharyngeal Swab; Nasopharyngeal(NP) swabs in vial transport medium  Result Value Ref Range Status   SARS Coronavirus 2 by RT PCR NEGATIVE NEGATIVE Final    Comment: (NOTE) SARS-CoV-2 target nucleic acids are NOT DETECTED.  The SARS-CoV-2 RNA is generally detectable in upper respiratory specimens during the acute phase of infection. The lowest concentration of SARS-CoV-2  viral copies this assay can detect is 138 copies/mL. [name] negative result does not preclude SARS-Cov-2 infection and should not be used as the sole basis for treatment or other patient management decisions. [name] negative result may occur with  improper specimen collection/handling, submission of specimen other than nasopharyngeal swab, presence of viral mutation(s) within the areas targeted by this assay, and inadequate number of viral copies(<138 copies/mL). [name] negative result must be combined with clinical observations, patient history, and epidemiological information. The expected result is Negative.  Fact Sheet for Patients:  EntrepreneurPulse.com.au  Fact Sheet for Healthcare Providers:  IncredibleEmployment.be  This test is no t yet approved or cleared by the [place] FDA and  has been authorized for detection and/or diagnosis of SARS-CoV-2 by FDA under an Emergency Use Authorization (EUA). This EUA will remain  in effect (meaning this test can be used) for the duration of the COVID-19 declaration under Section 564(b)(1) of the Act, 21 U.S.C.section 360bbb-3(b)(1), unless the authorization is terminated  or revoked sooner.       Influenza Cherissa Hook by PCR NEGATIVE NEGATIVE Final   Influenza B by PCR NEGATIVE NEGATIVE Final    Comment: (NOTE) The Xpert Xpress SARS-CoV-2/FLU/RSV plus assay is intended as an aid in the diagnosis of influenza from Nasopharyngeal swab specimens and should not be used as Laverda Stribling sole basis for treatment. Nasal washings and aspirates are unacceptable for Xpert Xpress SARS-CoV-2/FLU/RSV testing.  Fact Sheet for Patients: EntrepreneurPulse.com.au  Fact Sheet for Healthcare Providers: IncredibleEmployment.be  This test is not yet approved or cleared by the [place] FDA and has been authorized for detection and/or diagnosis of SARS-CoV-2 by FDA under an Emergency Use Authorization  (EUA). This EUA will remain in effect (meaning this test can be used) for the duration of the COVID-19 declaration under Section 564(b)(1) of the Act, 21 U.S.C. section 360bbb-3(b)(1), unless the authorization is terminated or revoked.  Performed at [facility], [address] [place], [place]  [address]   Blood culture (routine x 2)     Status: None (Preliminary result)   Collection Time: 01/06/21  6:40 PM   Specimen: BLOOD  Result Value Ref Range Status   Specimen Description   Final    BLOOD RIGHT ANTECUBITAL Performed at [facility] [address], [place], [place] [address]    Special Requests   Final    BOTTLES DRAWN AEROBIC AND ANAEROBIC Blood Culture results may not be optimal due to an inadequate volume of blood received in culture bottles Performed at [facility] [address] [place], [place] [address]    Culture   Final    NO GROWTH 2 DAYS Performed at [facility] [address] [place], [place] [address]    Report Status PENDING  Incomplete  Urine Culture     Status: Abnormal (Preliminary result)   Collection Time: 01/06/21  9:45 PM   Specimen: Urine, Clean Catch  Result Value Ref Range Status   Specimen Description   Final    URINE, CLEAN CATCH Performed at [facility], [place] [address], [place], [place] [address]    Special Requests   Final    NONE Performed at [facility], [place] [address] [place], [place] [address]    Culture (Raechel Marcos)  Final    20,000 COLONIES/mL ESCHERICHIA COLI SUSCEPTIBILITIES TO FOLLOW 80,000 COLONIES/mL LACTOBACILLUS SPECIES Standardized susceptibility testing for this organism is not available. Performed at [facility], [place] [address] [place], [place] [address]    Report Status PENDING  Incomplete  Blood culture (routine x 2)     Status: None (Preliminary result)   Collection Time: 01/07/21 12:39 AM   Specimen: Right  Antecubital; Blood  Result Value Ref Range Status   Specimen Description   Final    RIGHT ANTECUBITAL BLOOD Performed at [facility], [place] [address], [place], [place] [address]    Special Requests   Final    BOTTLES DRAWN AEROBIC AND ANAEROBIC Blood Culture adequate volume Performed at [facility] [address], [place], [place] [address]    Culture   Final    NO GROWTH 1 DAY Performed at [facility], [place] [address] [place], [place] [address]    Report Status PENDING  Incomplete  Aerobic/Anaerobic Culture w Gram Stain (surgical/deep wound)     Status: None (Preliminary result)   Collection Time: 01/07/21  1:42 PM   Specimen: Abscess  Result Value Ref Range Status   Specimen Description   Final    ABSCESS RIGHT AXILLA Performed at [facility] [address], [place], [place] [address]    Special Requests   Final    NONE Performed at [facility], [place] [address], [place], [place] [address]    Gram Stain   Final    FEW WBC PRESENT,BOTH PMN AND MONONUCLEAR MODERATE GRAM POSITIVE COCCI    Culture   Final    FEW STREPTOCOCCUS AGALACTIAE TESTING AGAINST S. AGALACTIAE NOT ROUTINELY PERFORMED DUE TO PREDICTABILITY OF AMP/PEN/VAN SUSCEPTIBILITY. Performed at [facility], [place] [address], [place], [place] [address]    Report Status PENDING  Incomplete         Radiology Studies: CT Angio Chest PE W and/or Wo Contrast  Result Date: 01/06/2021 CLINICAL DATA:  Shortness of breath. Abdominal infection, fever and pain. EXAM: CT ANGIOGRAPHY CHEST CT ABDOMEN AND PELVIS WITH CONTRAST TECHNIQUE:  Multidetector CT imaging of the chest was performed using the standard protocol during bolus administration of intravenous contrast. Multiplanar CT image reconstructions and MIPs were obtained to evaluate the vascular anatomy. Multidetector CT imaging of the abdomen and pelvis was performed using the standard protocol  during bolus administration of intravenous contrast. CONTRAST:  76mL OMNIPAQUE IOHEXOL 350 MG/ML SOLN COMPARISON:  None. FINDINGS: CTA CHEST FINDINGS Cardiovascular: Satisfactory opacification of the pulmonary arteries to the segmental level. No evidence of pulmonary embolism. The heart is borderline enlarged. There is no pericardial effusion. Aorta is normal in size. There are atherosclerotic calcifications of the aorta. Chest port catheter tip ends in the SVC. Mediastinum/Nodes: No enlarged mediastinal, hilar, or axillary lymph nodes. Thyroid gland, trachea, and esophagus demonstrate no significant findings. Lungs/Pleura: There is mild atelectasis in the bilateral lower lobes. The lungs otherwise appear clear. There is no pleural effusion or pneumothorax. Trachea and central airways are patent. Musculoskeletal: No acute fracture. Right sided chest port is present. There is marked subcutaneous edema in the anterior right chest wall surrounding the port. There is some soft tissue air in the lower right neck, likely related to port placement, although infection is not excluded. There are 2 fluid collections within the right breast with mild surrounding stranding. One is located in the lateral right breast measuring 5.1 by 3.1 by 4.8 cm. The other is located in the superior central breast measuring 4.2 by 1.5 by 2.5 cm. Review of the MIP images confirms the above findings. CT ABDOMEN and PELVIS FINDINGS Hepatobiliary: The gallbladder surgically absent. No focal liver lesions are identified. There is no biliary ductal dilatation. Pancreas: There is [name] fat attenuation lesion in the neck of the pancreas measuring 10 x 17 mm compatible with lipoma. Pancreas otherwise appears within normal limits. Spleen: Normal in size without focal abnormality. Adrenals/Urinary Tract: Adrenal glands are unremarkable. Kidneys are normal, without renal calculi, focal lesion, or hydronephrosis. Bladder is unremarkable. Stomach/Bowel: There  is no evidence for bowel obstruction or free air. The appendix is within normal limits. There is sigmoid colon diverticulosis. There is some mild inflammatory stranding surrounding diverticula in the sigmoid colon compatible with acute diverticulitis. There is no evidence for perforation or abscess. Small bowel and stomach are within normal limits. Vascular/Lymphatic: Aortic atherosclerosis. No enlarged abdominal or pelvic lymph nodes. Reproductive: Status post hysterectomy. No adnexal masses. Other: No abdominal wall hernia or abnormality. No abdominopelvic ascites. Musculoskeletal: Degenerative changes affect the spine. Review of the MIP images confirms the above findings. IMPRESSION: 1. No evidence for pulmonary embolism. 2. Right chest port in place. There is significant subcutaneous stranding surrounding the right chest port with some soft tissue air in the lower right neck. Findings may be related to recent port placement; however, soft tissue infection is not excluded. 3. There are 2 right breast fluid collections, indeterminate. The largest measures up to 5.1 cm. 4. Acute uncomplicated sigmoid colon diverticulitis. 5. Pancreatic lipoma. Electronically Signed   By: Ronney Asters M.D.   On: 01/06/2021 20:58   CT ABDOMEN PELVIS W CONTRAST  Result Date: 01/06/2021 CLINICAL DATA:  Shortness of breath. Abdominal infection, fever and pain. EXAM: CT ANGIOGRAPHY CHEST CT ABDOMEN AND PELVIS WITH CONTRAST TECHNIQUE: Multidetector CT imaging of the chest was performed using the standard protocol during bolus administration of intravenous contrast. Multiplanar CT image reconstructions and MIPs were obtained to evaluate the vascular anatomy. Multidetector CT imaging of the abdomen and pelvis was performed using the standard protocol during bolus administration of intravenous contrast.  CONTRAST:  88mL OMNIPAQUE IOHEXOL 350 MG/ML SOLN COMPARISON:  None. FINDINGS: CTA CHEST FINDINGS Cardiovascular: Satisfactory  opacification of the pulmonary arteries to the segmental level. No evidence of pulmonary embolism. The heart is borderline enlarged. There is no pericardial effusion. Aorta is normal in size. There are atherosclerotic calcifications of the aorta. Chest port catheter tip ends in the SVC. Mediastinum/Nodes: No enlarged mediastinal, hilar, or axillary lymph nodes. Thyroid gland, trachea, and esophagus demonstrate no significant findings. Lungs/Pleura: There is mild atelectasis in the bilateral lower lobes. The lungs otherwise appear clear. There is no pleural effusion or pneumothorax. Trachea and central airways are patent. Musculoskeletal: No acute fracture. Right sided chest port is present. There is marked subcutaneous edema in the anterior right chest wall surrounding the port. There is some soft tissue air in the lower right neck, likely related to port placement, although infection is not excluded. There are 2 fluid collections within the right breast with mild surrounding stranding. One is located in the lateral right breast measuring 5.1 by 3.1 by 4.8 cm. The other is located in the superior central breast measuring 4.2 by 1.5 by 2.5 cm. Review of the MIP images confirms the above findings. CT ABDOMEN and PELVIS FINDINGS Hepatobiliary: The gallbladder surgically absent. No focal liver lesions are identified. There is no biliary ductal dilatation. Pancreas: There is [name] fat attenuation lesion in the neck of the pancreas measuring 10 x 17 mm compatible with lipoma. Pancreas otherwise appears within normal limits. Spleen: Normal in size without focal abnormality. Adrenals/Urinary Tract: Adrenal glands are unremarkable. Kidneys are normal, without renal calculi, focal lesion, or hydronephrosis. Bladder is unremarkable. Stomach/Bowel: There is no evidence for bowel obstruction or free air. The appendix is within normal limits. There is sigmoid colon diverticulosis. There is some mild inflammatory stranding surrounding  diverticula in the sigmoid colon compatible with acute diverticulitis. There is no evidence for perforation or abscess. Small bowel and stomach are within normal limits. Vascular/Lymphatic: Aortic atherosclerosis. No enlarged abdominal or pelvic lymph nodes. Reproductive: Status post hysterectomy. No adnexal masses. Other: No abdominal wall hernia or abnormality. No abdominopelvic ascites. Musculoskeletal: Degenerative changes affect the spine. Review of the MIP images confirms the above findings. IMPRESSION: 1. No evidence for pulmonary embolism. 2. Right chest port in place. There is significant subcutaneous stranding surrounding the right chest port with some soft tissue air in the lower right neck. Findings may be related to recent port placement; however, soft tissue infection is not excluded. 3. There are 2 right breast fluid collections, indeterminate. The largest measures up to 5.1 cm. 4. Acute uncomplicated sigmoid colon diverticulitis. 5. Pancreatic lipoma. Electronically Signed   By: Ronney Asters M.D.   On: 01/06/2021 20:58   DG Chest Port 1 View  Result Date: 01/06/2021 CLINICAL DATA:  Hypoxia and fevers EXAM: PORTABLE CHEST 1 VIEW COMPARISON:  12/30/2020 FINDINGS: Stable right-sided chest wall port is noted with catheter tip in the mid superior vena cava. No pneumothorax is noted. Cardiac shadow is within normal limits. The lungs are well aerated without focal infiltrate or effusion. No bony abnormality is noted. IMPRESSION: No acute abnormality seen. Electronically Signed   By: Inez Catalina M.D.   On: 01/06/2021 19:26   US BREAST ASPIRATION RIGHT  Result Date: 01/07/2021 INDICATION: Breast cancer history status post recent RIGHT lumpectomy and axillary biopsy. Febrile, infectious workup. EXAM: US-GUIDED RIGHT AXILLARY COLLECTION ASPIRATION COMPARISON:  CT chest, 01/06/2021.  OR fluoroscopy, 12/30/2020. MEDICATIONS: The patient is currently admitted to the hospital  and receiving intravenous  antibiotics. The antibiotics were administered within an appropriate time frame prior to the initiation of the procedure. ANESTHESIA/SEDATION: Local anesthetic was administered. CONTRAST:  None COMPLICATIONS: None immediate. PROCEDURE: Informed written consent was obtained from the patient after [name] discussion of the risks, benefits and alternatives to treatment. Preprocedural ultrasound scanning demonstrated well circumscribed, round RIGHT axillary anechoic collection measuring approximately 5.2 x 4.5 cm. [name] timeout was performed prior to the initiation of the procedure. The RIGHT axilla was prepped and draped in the usual sterile fashion. The overlying soft tissues were anesthetized with 1% lidocaine with epinephrine. Under direct ultrasound guidance, [name] 18 gauge trocar needle was advanced into the abscess/fluid collection. Multiple ultrasound images were saved for procedural documentation purposes. Next, approximately 40 mL of serosanguineous fluid was aspirated from the collection. [name] representative sample of aspirated fluid was capped and sent to the laboratory for analysis. The needle was removed and superficial hemostasis was achieved with manual compression. Rigo Letts dressing was placed. The patient tolerated the procedure well without immediate postprocedural complication. IMPRESSION: Successful US guided of 40 mL of serosanguineous fluid from RIGHT axillary fluid collection. Keeghan Bialy representative aspirated sample was sent to the laboratory as requested by the ordering clinical team. Michaelle Birks, MD [facility] Electronically Signed   By: Michaelle Birks M.D.   On: 01/07/2021 13:56        Scheduled Meds:  amitriptyline  25 mg Oral QHS   Chlorhexidine Gluconate Cloth  6 each Topical Daily   cholecalciferol  5,000 Units Oral Daily   heparin  5,000 Units Subcutaneous Q8H   insulin aspart  0-9 Units Subcutaneous TID AC & HS   insulin aspart  4 Units  Subcutaneous TID WC   insulin glargine-yfgn  45 Units Subcutaneous Daily   loratadine  10 mg Oral Daily   losartan  25 mg Oral Daily   multivitamin with minerals  1 tablet Oral Daily   Ensure Max Protein  11 oz Oral Daily   saccharomyces boulardii  250 mg Oral BID   sertraline  50 mg Oral Daily   simvastatin  40 mg Oral QHS   zolpidem  5 mg Oral Once   Continuous Infusions:  cefTRIAXone (ROCEPHIN)  IV 2 g (01/08/21 1336)   metronidazole 500 mg (01/08/21 1233)     LOS: 2 days    Time spent: over 30 min    Fayrene Helper, MD [facility]   To contact the attending provider between 7A-7P or the covering provider during after hours 7P-7A, please log into the web site [email or web address] and access using universal [place] password for that web site. If you do not have the password, please call the hospital operator.  01/08/2021, 4:01 PM

## 2021-01-09 ENCOUNTER — Inpatient Hospital Stay (HOSPITAL_COMMUNITY): Payer: Medicare HMO | Admitting: Certified Registered Nurse Anesthetist

## 2021-01-09 ENCOUNTER — Encounter (HOSPITAL_COMMUNITY): Payer: Self-pay

## 2021-01-09 ENCOUNTER — Encounter (HOSPITAL_COMMUNITY)
Admission: EM | Disposition: A | Discharge status: Home / Self Care | Payer: Self-pay | Source: Home / Self Care | Attending: Family Medicine

## 2021-01-09 DIAGNOSIS — R6510 Systemic inflammatory response syndrome (SIRS) of non-infectious origin without acute organ dysfunction: Secondary | ICD-10-CM | POA: Diagnosis not present

## 2021-01-09 HISTORY — PX: BREAST LUMPECTOMY: SHX2

## 2021-01-09 LAB — COMPREHENSIVE METABOLIC PANEL
ALT: 18 U/L (ref 0–44)
AST: 18 U/L (ref 15–41)
Albumin: 2.9 g/dL — ABNORMAL LOW (ref 3.5–5.0)
Alkaline Phosphatase: 85 U/L (ref 38–126)
Anion gap: 7 (ref 5–15)
BUN: 6 mg/dL — ABNORMAL LOW (ref 8–23)
CO2: 29 mmol/L (ref 22–32)
Calcium: 8.1 mg/dL — ABNORMAL LOW (ref 8.9–10.3)
Chloride: 100 mmol/L (ref 98–111)
Creatinine, Ser: 0.48 mg/dL (ref 0.44–1.00)
GFR, Estimated: >60 mL/min (ref >60)
Glucose, Bld: 131 mg/dL — ABNORMAL HIGH (ref 70–99)
Potassium: 3.2 mmol/L — ABNORMAL LOW (ref 3.5–5.1)
Sodium: 136 mmol/L (ref 135–145)
Total Bilirubin: 0.4 mg/dL (ref 0.3–1.2)
Total Protein: 5.8 g/dL — ABNORMAL LOW (ref 6.5–8.1)

## 2021-01-09 LAB — CBC WITH DIFFERENTIAL/PLATELET
Abs Immature Granulocytes: 1.29 10*3/uL — ABNORMAL HIGH (ref 0.00–0.07)
Basophils Absolute: 0 10*3/uL (ref 0.0–0.1)
Basophils Relative: 0 %
Eosinophils Absolute: 0.1 10*3/uL (ref 0.0–0.5)
Eosinophils Relative: 1 %
HCT: 30.1 % — ABNORMAL LOW (ref 36.0–46.0)
Hemoglobin: 9.7 g/dL — ABNORMAL LOW (ref 12.0–15.0)
Immature Granulocytes: 10 %
Lymphocytes Relative: 17 %
Lymphs Abs: 2.2 10*3/uL (ref 0.7–4.0)
MCH: 29.3 pg (ref 26.0–34.0)
MCHC: 32.2 g/dL (ref 30.0–36.0)
MCV: 90.9 fL (ref 80.0–100.0)
Monocytes Absolute: 1.4 10*3/uL — ABNORMAL HIGH (ref 0.1–1.0)
Monocytes Relative: 11 %
Neutro Abs: 8.3 10*3/uL — ABNORMAL HIGH (ref 1.7–7.7)
Neutrophils Relative %: 61 %
Platelets: 207 10*3/uL (ref 150–400)
RBC: 3.31 MIL/uL — ABNORMAL LOW (ref 3.87–5.11)
RDW: 13.6 % (ref 11.5–15.5)
WBC: 13.3 10*3/uL — ABNORMAL HIGH (ref 4.0–10.5)
nRBC: 0.5 % — ABNORMAL HIGH (ref 0.0–0.2)

## 2021-01-09 LAB — GLUCOSE, CAPILLARY
Glucose-Capillary: 124 mg/dL — ABNORMAL HIGH (ref 70–99)
Glucose-Capillary: 114 mg/dL — ABNORMAL HIGH (ref 70–99)
Glucose-Capillary: 102 mg/dL — ABNORMAL HIGH (ref 70–99)
Glucose-Capillary: 115 mg/dL — ABNORMAL HIGH (ref 70–99)
Glucose-Capillary: 145 mg/dL — ABNORMAL HIGH (ref 70–99)

## 2021-01-09 LAB — URINE CULTURE: Culture: 20000 — AB

## 2021-01-09 LAB — MAGNESIUM: Magnesium: 1.5 mg/dL — ABNORMAL LOW (ref 1.7–2.4)

## 2021-01-09 LAB — PHOSPHORUS: Phosphorus: 3.2 mg/dL (ref 2.5–4.6)

## 2021-01-09 SURGERY — BREAST LUMPECTOMY
Anesthesia: General | Site: Breast | Laterality: Right

## 2021-01-09 MED ORDER — FENTANYL CITRATE PF 50 MCG/ML IJ SOSY: 25.0000 ug | PREFILLED_SYRINGE | INTRAMUSCULAR | Status: DC | PRN

## 2021-01-09 MED ORDER — MIDAZOLAM HCL 2 MG/2ML IJ SOLN
INTRAMUSCULAR | Status: DC | PRN
  Administered 2021-01-09: 1 mg via INTRAVENOUS

## 2021-01-09 MED ORDER — TRAMADOL HCL 50 MG PO TABS
100.0000 mg | ORAL_TABLET | Freq: Four times a day (QID) | ORAL | Status: DC | PRN
  Administered 2021-01-09 – 2021-01-12 (×7): 100 mg via ORAL
  Filled 2021-01-09 (×7): qty 2

## 2021-01-09 MED ORDER — OXYCODONE HCL 5 MG/5ML PO SOLN: 5.0000 mg | Freq: Once | ORAL | Status: DC | PRN

## 2021-01-09 MED ORDER — PROPOFOL 500 MG/50ML IV EMUL
INTRAVENOUS | Status: AC
  Filled 2021-01-09: qty 50

## 2021-01-09 MED ORDER — FENTANYL CITRATE (PF) 100 MCG/2ML IJ SOLN
INTRAMUSCULAR | Status: AC
  Filled 2021-01-09: qty 2

## 2021-01-09 MED ORDER — FENTANYL CITRATE (PF) 100 MCG/2ML IJ SOLN
INTRAMUSCULAR | Status: DC | PRN
  Administered 2021-01-09 (×2): 50 ug via INTRAVENOUS

## 2021-01-09 MED ORDER — PROMETHAZINE HCL 25 MG/ML IJ SOLN
6.2500 mg | INTRAMUSCULAR | Status: DC | PRN
  Administered 2021-01-09: 6.25 mg via INTRAVENOUS

## 2021-01-09 MED ORDER — PROPOFOL 10 MG/ML IV BOLUS
INTRAVENOUS | Status: DC | PRN
  Administered 2021-01-09: 160 mg via INTRAVENOUS

## 2021-01-09 MED ORDER — CHLORHEXIDINE GLUCONATE 0.12 % MT SOLN
15.0000 mL | Freq: Once | OROMUCOSAL | Status: AC
  Administered 2021-01-09: 15 mL via OROMUCOSAL

## 2021-01-09 MED ORDER — ONDANSETRON HCL 4 MG/2ML IJ SOLN: 4.0000 mg | Freq: Once | INTRAMUSCULAR | Status: DC | PRN

## 2021-01-09 MED ORDER — PHENYLEPHRINE 40 MCG/ML (10ML) SYRINGE FOR IV PUSH (FOR BLOOD PRESSURE SUPPORT)
PREFILLED_SYRINGE | INTRAVENOUS | Status: DC | PRN
  Administered 2021-01-09: 120 ug via INTRAVENOUS
  Administered 2021-01-09 (×2): 80 ug via INTRAVENOUS

## 2021-01-09 MED ORDER — ONDANSETRON HCL 4 MG/2ML IJ SOLN
INTRAMUSCULAR | Status: AC
  Filled 2021-01-09: qty 2

## 2021-01-09 MED ORDER — 0.9 % SODIUM CHLORIDE (POUR BTL) OPTIME
TOPICAL | Status: DC | PRN
  Administered 2021-01-09: 1000 mL

## 2021-01-09 MED ORDER — OXYCODONE HCL 5 MG PO TABS
5.0000 mg | ORAL_TABLET | Freq: Once | ORAL | Status: DC | PRN
Start: 2021-01-09 — End: 2021-01-09

## 2021-01-09 MED ORDER — PHENYLEPHRINE 40 MCG/ML (10ML) SYRINGE FOR IV PUSH (FOR BLOOD PRESSURE SUPPORT)
PREFILLED_SYRINGE | INTRAVENOUS | Status: AC
  Filled 2021-01-09: qty 10

## 2021-01-09 MED ORDER — BUPIVACAINE-EPINEPHRINE 0.25% -1:200000 IJ SOLN
INTRAMUSCULAR | Status: DC | PRN
  Administered 2021-01-09: 5 mL

## 2021-01-09 MED ORDER — LIP MEDEX EX OINT
1.0000 "application " | TOPICAL_OINTMENT | CUTANEOUS | Status: DC | PRN
  Filled 2021-01-09: qty 7

## 2021-01-09 MED ORDER — MIDAZOLAM HCL 2 MG/2ML IJ SOLN
INTRAMUSCULAR | Status: AC
  Filled 2021-01-09: qty 2

## 2021-01-09 MED ORDER — FENTANYL CITRATE PF 50 MCG/ML IJ SOSY
PREFILLED_SYRINGE | INTRAMUSCULAR | Status: AC
  Filled 2021-01-09: qty 1

## 2021-01-09 MED ORDER — BUPIVACAINE-EPINEPHRINE (PF) 0.25% -1:200000 IJ SOLN
INTRAMUSCULAR | Status: AC
  Filled 2021-01-09: qty 30

## 2021-01-09 MED ORDER — ONDANSETRON HCL 4 MG/2ML IJ SOLN
INTRAMUSCULAR | Status: DC | PRN
  Administered 2021-01-09: 4 mg via INTRAVENOUS

## 2021-01-09 MED ORDER — LIDOCAINE HCL (PF) 2 % IJ SOLN
INTRAMUSCULAR | Status: AC
  Filled 2021-01-09: qty 5

## 2021-01-09 MED ORDER — LACTATED RINGERS IV SOLN: INTRAVENOUS | Status: DC

## 2021-01-09 MED ORDER — PROMETHAZINE HCL 25 MG/ML IJ SOLN
INTRAMUSCULAR | Status: AC
  Filled 2021-01-09: qty 1

## 2021-01-09 MED ORDER — LIDOCAINE 2% (20 MG/ML) 5 ML SYRINGE
INTRAMUSCULAR | Status: DC | PRN
  Administered 2021-01-09: 60 mg via INTRAVENOUS

## 2021-01-09 MED ORDER — FENTANYL CITRATE PF 50 MCG/ML IJ SOSY
50.0000 ug | PREFILLED_SYRINGE | INTRAMUSCULAR | Status: DC
  Administered 2021-01-09: 50 ug via INTRAVENOUS

## 2021-01-09 SURGICAL SUPPLY — 42 items
ADH SKN CLS APL DERMABOND .7 (GAUZE/BANDAGES/DRESSINGS)
APL PRP STRL LF DISP 70% ISPRP (MISCELLANEOUS)
BAG COUNTER SPONGE SURGICOUNT (BAG) IMPLANT
BAG SPNG CNTER NS LX DISP (BAG)
BLADE HEX COATED 2.75 (ELECTRODE) ×2 IMPLANT
BLADE SURG 15 STRL LF DISP TIS (BLADE) ×3 IMPLANT
BLADE SURG 15 STRL SS (BLADE) ×6
CHLORAPREP W/TINT 26 (MISCELLANEOUS) IMPLANT
COVER SURGICAL LIGHT HANDLE (MISCELLANEOUS) ×2 IMPLANT
DECANTER SPIKE VIAL GLASS SM (MISCELLANEOUS) ×2 IMPLANT
DERMABOND ADVANCED (GAUZE/BANDAGES/DRESSINGS)
DERMABOND ADVANCED .7 DNX12 (GAUZE/BANDAGES/DRESSINGS) IMPLANT
DRAPE LAPAROSCOPIC ABDOMINAL (DRAPES) ×2 IMPLANT
DRSG PAD ABDOMINAL 8X10 ST (GAUZE/BANDAGES/DRESSINGS) ×1 IMPLANT
ELECT REM PT RETURN 15FT ADLT (MISCELLANEOUS) ×2 IMPLANT
GAUZE 4X4 16PLY ~~LOC~~+RFID DBL (SPONGE) ×2 IMPLANT
GAUZE PACKING IODOFORM 1/4X15 (PACKING) ×1 IMPLANT
GAUZE SPONGE 4X4 12PLY STRL (GAUZE/BANDAGES/DRESSINGS) ×1 IMPLANT
GLOVE SURG ENC MOIS LTX SZ7 (GLOVE) ×2 IMPLANT
GLOVE SURG UNDER POLY LF SZ7 (GLOVE) ×2 IMPLANT
GLOVE SURG UNDER POLY LF SZ7.5 (GLOVE) ×2 IMPLANT
GOWN STRL REUS W/ TWL XL LVL3 (GOWN DISPOSABLE) ×1 IMPLANT
GOWN STRL REUS W/TWL LRG LVL3 (GOWN DISPOSABLE) ×4 IMPLANT
GOWN STRL REUS W/TWL XL LVL3 (GOWN DISPOSABLE) ×4 IMPLANT
KIT BASIN OR (CUSTOM PROCEDURE TRAY) ×2 IMPLANT
MARKER SKIN DUAL TIP RULER LAB (MISCELLANEOUS) ×2 IMPLANT
NDL HYPO 25X1 1.5 SAFETY (NEEDLE) ×1 IMPLANT
NEEDLE HYPO 22GX1.5 SAFETY (NEEDLE) IMPLANT
NEEDLE HYPO 25X1 1.5 SAFETY (NEEDLE) ×2 IMPLANT
PACK BASIC VI WITH GOWN DISP (CUSTOM PROCEDURE TRAY) ×2 IMPLANT
PENCIL SMOKE EVACUATOR (MISCELLANEOUS) IMPLANT
STRIP CLOSURE SKIN 1/2X4 (GAUZE/BANDAGES/DRESSINGS) ×2 IMPLANT
SUT MNCRL AB 4-0 PS2 18 (SUTURE) ×2 IMPLANT
SUT VIC AB 2-0 SH 27 (SUTURE) ×2
SUT VIC AB 2-0 SH 27X BRD (SUTURE) ×1 IMPLANT
SUT VIC AB 3-0 SH 27 (SUTURE) ×2
SUT VIC AB 3-0 SH 27XBRD (SUTURE) ×1 IMPLANT
SYR BULB IRRIG 60ML STRL (SYRINGE) IMPLANT
SYR CONTROL 10ML LL (SYRINGE) ×2 IMPLANT
TAPE PAPER 3X10 WHT MICROPORE (GAUZE/BANDAGES/DRESSINGS) ×1 IMPLANT
TOWEL OR 17X26 10 PK STRL BLUE (TOWEL DISPOSABLE) ×2 IMPLANT
TOWEL OR NON WOVEN STRL DISP B (DISPOSABLE) ×2 IMPLANT

## 2021-01-09 NOTE — Transfer of Care (Signed)
Immediate Anesthesia Transfer of Care Note  Patient: ANNIELEE JEMMOTT  Procedure(s) Performed: incision drainage right breast abscess (Right: Breast)  Patient Location: PACU  Anesthesia Type:General  Level of Consciousness: awake and patient cooperative  Airway & Oxygen Therapy: Patient Spontanous Breathing and Patient connected to face mask  Post-op Assessment: Report given to RN and Post -op Vital signs reviewed and stable  Post vital signs: Reviewed and stable  Last Vitals:  Vitals Value Taken Time  BP 158/76 01/09/21 1433  Temp    Pulse 94 01/09/21 1435  Resp 18 01/09/21 1435  SpO2 97 % 01/09/21 1435  Vitals shown include unvalidated device data.  Last Pain:  Vitals:   01/09/21 1328  TempSrc:   PainSc: 10-Worst pain ever      Patients Stated Pain Goal: 9 (71/29/29 0903)  Complications: No notable events documented.

## 2021-01-09 NOTE — Anesthesia Procedure Notes (Signed)
Procedure Name: LMA Insertion Date/Time: 01/09/2021 2:00 PM Performed by: Claudia Desanctis, CRNA Pre-anesthesia Checklist: Emergency Drugs available, Patient identified, Suction available and Patient being monitored Patient Re-evaluated:Patient Re-evaluated prior to induction Oxygen Delivery Method: Circle system utilized Preoxygenation: Pre-oxygenation with 100% oxygen Induction Type: IV induction Ventilation: Mask ventilation without difficulty LMA: LMA inserted LMA Size: 4.0 Number of attempts: 1 Placement Confirmation: positive ETCO2 and breath sounds checked- equal and bilateral Tube secured with: Tape Dental Injury: Teeth and Oropharynx as per pre-operative assessment

## 2021-01-09 NOTE — Progress Notes (Signed)
Patient ID: [name], female   DOB: 1956-01-19, 65 y.o.   MRN: [id number]  [facility] Surgery Progress Note     Subjective: CC-  States that she feels awful today. Continues to have congestion and sore throat.  Also states that her abdominal pain is unchanged. She has persistent lower pain and nausea with PO intake. No emesis. BM yesterday. She is also concerned about her right breast/chest wall. States that the cellulitis has not improved since admission. The area is very tender. No active drainage. WBC down 13.3, afebrile  Objective: Vital signs in last 24 hours: Temp:  [98 F (36.7 C)-98.4 F (36.9 C)] 98.4 F (36.9 C) (10/20 0425) Pulse Rate:  [85-88] 88 (10/20 0425) Resp:  [17-18] 18 (10/20 0425) BP: (142-148)/(66-77) 147/70 (10/20 0425) SpO2:  [90 %-93 %] 93 % (10/20 0425) Last BM Date: 01/08/21  Intake/Output from previous day: 10/19 0701 - 10/20 0700 In: 970.4 [P.O.:660; IV Piggyback:310.4] Out: -  Intake/Output this shift: No intake/output data recorded.  PE: Gen:  Alert, NAD, pleasant HEENT: EOM's intact, pupils equal and round, visualized portion of throat appears normal Pulm:  rate and effort normal Chest: right chest port without infection. Right breast with persistent erythema laterally unchanged, no fluctuance Abd: Soft, NT, subjective LLQ TTP Skin: warm and dry  Lab Results:  Recent Labs    01/08/21 0531 01/09/21 0514  WBC 16.5* 13.3*  HGB 9.9* 9.7*  HCT 30.4* 30.1*  PLT 206 207   BMET Recent Labs    01/08/21 0531 01/09/21 0514  NA 134* 136  K 3.5 3.2*  CL 103 100  CO2 26 29  GLUCOSE 181* 131*  BUN 6* 6*  CREATININE 0.52 0.48  CALCIUM 8.2* 8.1*   PT/INR No results for input(s): LABPROT, INR in the last 72 hours. CMP     Component Value Date/Time   NA 136 01/09/2021 0514   K 3.2 (L) 01/09/2021 0514   CL 100 01/09/2021 0514   CO2 29 01/09/2021 0514   GLUCOSE 131 (H) 01/09/2021 0514   BUN 6 (L) 01/09/2021 0514    CREATININE 0.48 01/09/2021 0514   CREATININE 0.76 12/31/2020 0750   CALCIUM 8.1 (L) 01/09/2021 0514   PROT 5.8 (L) 01/09/2021 0514   ALBUMIN 2.9 (L) 01/09/2021 0514   AST 18 01/09/2021 0514   AST 24 12/31/2020 0750   ALT 18 01/09/2021 0514   ALT 25 12/31/2020 0750   ALKPHOS 85 01/09/2021 0514   BILITOT 0.4 01/09/2021 0514   BILITOT 0.3 12/31/2020 0750   GFRNONAA >60 01/09/2021 0514   GFRNONAA >60 12/31/2020 0750   Lipase     Component Value Date/Time   LIPASE 20 01/06/2021 1840       Studies/Results: US BREAST ASPIRATION RIGHT  Result Date: 01/07/2021 INDICATION: Breast cancer history status post recent RIGHT lumpectomy and axillary biopsy. Febrile, infectious workup. EXAM: US-GUIDED RIGHT AXILLARY COLLECTION ASPIRATION COMPARISON:  CT chest, 01/06/2021.  OR fluoroscopy, 12/30/2020. MEDICATIONS: The patient is currently admitted to the hospital and receiving intravenous antibiotics. The antibiotics were administered within an appropriate time frame prior to the initiation of the procedure. ANESTHESIA/SEDATION: Local anesthetic was administered. CONTRAST:  None COMPLICATIONS: None immediate. PROCEDURE: Informed written consent was obtained from the patient after a discussion of the risks, benefits and alternatives to treatment. Preprocedural ultrasound scanning demonstrated well circumscribed, round RIGHT axillary anechoic collection measuring approximately 5.2 x 4.5 cm. A timeout was performed prior to the initiation of the procedure. The RIGHT axilla was  prepped and draped in the usual sterile fashion. The overlying soft tissues were anesthetized with 1% lidocaine with epinephrine. Under direct ultrasound guidance, a 18 gauge trocar needle was advanced into the abscess/fluid collection. Multiple ultrasound images were saved for procedural documentation purposes. Next, approximately 40 mL of serosanguineous fluid was aspirated from the collection. A representative sample of aspirated  fluid was capped and sent to the laboratory for analysis. The needle was removed and superficial hemostasis was achieved with manual compression. A dressing was placed. The patient tolerated the procedure well without immediate postprocedural complication. IMPRESSION: Successful US guided of 40 mL of serosanguineous fluid from RIGHT axillary fluid collection. A representative aspirated sample was sent to the laboratory as requested by the ordering clinical team. Michaelle Birks, MD [facility] Radiology Electronically Signed   By: Michaelle Birks M.D.   On: 01/07/2021 13:56    Anti-infectives: Anti-infectives (From admission, onward)    Start     Dose/Rate Route Frequency Ordered Stop   01/08/21 1200  cefTRIAXone (ROCEPHIN) 2 g in sodium chloride 0.9 % 100 mL IVPB        2 g 200 mL/hr over 30 Minutes Intravenous Every 24 hours 01/08/21 1118     01/07/21 2200  vancomycin (VANCOREADY) IVPB 1250 mg/250 mL  Status:  Discontinued        1,250 mg 166.7 mL/hr over 90 Minutes Intravenous Every 24 hours 01/07/21 0030 01/08/21 1118   01/07/21 0400  ceFEPIme (MAXIPIME) 2 g in sodium chloride 0.9 % 100 mL IVPB  Status:  Discontinued        2 g 200 mL/hr over 30 Minutes Intravenous Every 8 hours 01/07/21 0030 01/08/21 1118   01/07/21 0030  ceFEPIme (MAXIPIME) 2 g in sodium chloride 0.9 % 100 mL IVPB  Status:  Discontinued        2 g 200 mL/hr over 30 Minutes Intravenous  Once 01/07/21 0016 01/07/21 0021   01/07/21 0030  metroNIDAZOLE (FLAGYL) IVPB 500 mg        500 mg 100 mL/hr over 60 Minutes Intravenous Every 12 hours 01/07/21 0016 01/14/21 0029   01/07/21 0030  vancomycin (VANCOCIN) IVPB 1000 mg/200 mL premix  Status:  Discontinued        1,000 mg 200 mL/hr over 60 Minutes Intravenous  Once 01/07/21 0016 01/07/21 0021   01/06/21 1930  vancomycin (VANCOCIN) IVPB 1000 mg/200 mL premix        1,000 mg 200 mL/hr over 60 Minutes Intravenous  Once 01/06/21 1916  01/06/21 2238   01/06/21 1930  ceFEPIme (MAXIPIME) 2 g in sodium chloride 0.9 % 100 mL IVPB        2 g 200 mL/hr over 30 Minutes Intravenous  Once 01/06/21 1921 01/06/21 2054        Assessment/Plan Fever on chemotherapy Breast cancer - port placed 12/30/20 does not appear infected - s/p right breast fluid collection aspiration 10/18 >> culture growing strep agalactiae, report pending. Continue broad spectrum antibiotics - Due to persistent cellulitis will obtain CT chest today to reevaluate. Make NPO in case something needs to be drained in the OR.   Sigmoid diverticulitis - Continue antibiotics   ID - cefepime, flagyl, vancomycin. WBC down 13.3 and afebrile x48 hours, continue to monitor FEN - NPO VTE - sq heparin Foley - none   HTN HLD DM   LOS: 3 days    Wellington Hampshire, [facility] Surgery 01/09/2021, 8:33 AM Please see Amion for  pager number during day hours 7:00am-4:30pm

## 2021-01-09 NOTE — Care Management Important Message (Signed)
Important Message  Patient Details IM Letter given to the Patient. Name: [name] MRN: [id number] Date of Birth: [birth year]   Medicare Important Message Given:  Yes     Kerin Salen 01/09/2021, 10:54 AM

## 2021-01-09 NOTE — Progress Notes (Signed)
PROGRESS NOTE    [name]  KZL:[id number] DOB: 29-Apr-1955 DOA: 01/06/2021 PCP: Kimberly Contras, MD   Chief Complaint  Patient presents with   cancer patient   Emesis   Dysuria   Fever    Brief Narrative:   65 y.o. female with history of breast cancer recently started on chemotherapy about [name] week ago day before that patient had [name] [name] placed on the right side presents to the ER because of fever chills and weakness.  Patient states after her chemotherapy last week she did well for the next 2 days then she has had some constipation and she took over-the-counter medications for constipation following which she started having multiple episodes of loose stools and dysuria started feeling fever chills weakness and at this point decided to come to the ER.  Has nausea no vomiting and has been having some left lower quadrant abdominal discomfort.   Patient states after her lumpectomy in August she did have some fluid collection and also had been treated with antibiotics for possible infection.   ED Course: In the ER patient was febrile with temperature 102 F tachycardic with lab work showing sodium of 128 blood glucose of 302 anion gap of 12 lactic acid of 2.1 CBC unremarkable COVID test negative.  Patient underwent CT scan of the chest and abdomen pelvis.  Showed features concerning for fluid stranding around the [place][name] which could be postoperative change or developing infection there is also fluid collection on the right breast area which is also concerning for possible developing infection and also acute sigmoid diverticulitis.  Patient was started on empiric antibiotics fluids admitted for further work-up.  UA is concerning for UTI.     Assessment & Plan:   Principal Problem:   SIRS (systemic inflammatory response syndrome) (HCC) Active Problems:   Malignant neoplasm of upper-inner quadrant of right breast in female, estrogen receptor positive ([name])   Uncontrolled type 2  diabetes mellitus with hyperglycemia ([place])  Sepsis secondary to skin soft tissue infection of right breast and Diverticulitis  Right Breast Abscess  Diverticulitis Fever, tachycardia on presentation in the setting of multiple infectious sources.  Now developed leukocytosis. Concern for infection related to recently placed port (10/10), but based on exam, this seems less likely - general surgery and oncology in agreement Now s/p US guided drainage of 40 ml serosanguinous fluid from right axillary fluid collection S/p  Incision and drainage of right axillary abscess 10/20 per general surgery Culture showing strep agalactiae Abx narrowed to ceftriaxone/flagyl   Blood cx with ng Urine culture showing e. Coli and lactobacillus, unclear significance  Breast Cancer Following outpatient with [name], she's s/p lumpectomy/sentinel LN sampling on 8/16  Day 9 cycle 1 cyclophosphamide and docetaxel - s/p pegfilgastrim 11/13 Has follow up 01/21/2021  T2DM Continue basal (45 units) and bolus (4 units TID with meals) along with SSI A1c 8.7 in 11/2020  HTN Losartan (pt notes for renal protection)  Dyslipidemia statin  DVT prophylaxis: heparin Code Status: full  Family Communication: none at bedside Disposition:   Status is: Inpatient  Remains inpatient appropriate because: continued need for IV abx       Consultants:  Surgery IR oncology  Procedures:  IMPRESSION: Successful US guided of 40 mL of serosanguineous fluid from RIGHT axillary fluid collection.   [name] representative aspirated sample was sent to the laboratory as requested by the ordering clinical team.  Antimicrobials: Anti-infectives (From admission, onward)    Start     Dose/Rate Route  Frequency Ordered Stop   01/08/21 1200  cefTRIAXone (ROCEPHIN) 2 g in sodium chloride 0.9 % 100 mL IVPB        2 g 200 mL/hr over 30 Minutes Intravenous Every 24 hours 01/08/21 1118     01/07/21 2200  vancomycin (VANCOREADY)  IVPB 1250 mg/250 mL  Status:  Discontinued        1,250 mg 166.7 mL/hr over 90 Minutes Intravenous Every 24 hours 01/07/21 0030 01/08/21 1118   01/07/21 0400  ceFEPIme (MAXIPIME) 2 g in sodium chloride 0.9 % 100 mL IVPB  Status:  Discontinued        2 g 200 mL/hr over 30 Minutes Intravenous Every 8 hours 01/07/21 0030 01/08/21 1118   01/07/21 0030  ceFEPIme (MAXIPIME) 2 g in sodium chloride 0.9 % 100 mL IVPB  Status:  Discontinued        2 g 200 mL/hr over 30 Minutes Intravenous  Once 01/07/21 0016 01/07/21 0021   01/07/21 0030  metroNIDAZOLE (FLAGYL) IVPB 500 mg        500 mg 100 mL/hr over 60 Minutes Intravenous Every 12 hours 01/07/21 0016 01/14/21 0029   01/07/21 0030  vancomycin (VANCOCIN) IVPB 1000 mg/200 mL premix  Status:  Discontinued        1,000 mg 200 mL/hr over 60 Minutes Intravenous  Once 01/07/21 0016 01/07/21 0021   01/06/21 1930  vancomycin (VANCOCIN) IVPB 1000 mg/200 mL premix        1,000 mg 200 mL/hr over 60 Minutes Intravenous  Once 01/06/21 1916 01/06/21 2238   01/06/21 1930  ceFEPIme (MAXIPIME) 2 g in sodium chloride 0.9 % 100 mL IVPB        2 g 200 mL/hr over 30 Minutes Intravenous  Once 01/06/21 1921 01/06/21 2054          Subjective: No new complaints Continued redness and pain to right breast  Objective: Vitals:   01/09/21 1530 01/09/21 1545 01/09/21 1600 01/09/21 1605  BP: (!) 164/84 (!) 164/78  (!) 148/71  Pulse: 86 84  82  Resp: 20 (!) 9 14 18   Temp:    98 F (36.7 C)  TempSrc:    Oral  SpO2: 96% 95%  91%  Weight:      Height:        Intake/Output Summary (Last 24 hours) at 01/09/2021 1729 Last data filed at 01/09/2021 1421 Gross per 24 hour  Intake 620 ml  Output 0 ml  Net 620 ml   Filed Weights   01/06/21 1718 01/07/21 0026 01/09/21 1317  Weight: 88.5 kg 92.3 kg 92.3 kg    Examination:  General exam: NAD Respiratory system: unlabored Cardiovascular system:RRR Gastrointestinal system: soft Central nervous system: [name]&O,  moving all extremities Extremities: no LEE Skin: right breast examined with [name] - erythema to lateral aspect of breast, mostly within borders, seems more TTP in some areas    Data Reviewed: I have personally reviewed following labs and imaging studies  CBC: Recent Labs  Lab 01/06/21 1840 01/07/21 0039 01/07/21 0418 01/08/21 0531 01/09/21 0514  WBC 6.9 10.3 10.1 16.5* 13.3*  NEUTROABS  --   --  5.8 9.8* 8.3*  HGB 12.7 11.8* 10.7* 9.9* 9.7*  HCT 38.6 36.4 32.4* 30.4* 30.1*  MCV 89.4 91.5 89.0 91.0 90.9  PLT 255 230 209 206 161    Basic Metabolic Panel: Recent Labs  Lab 01/06/21 1840 01/07/21 0039 01/07/21 0418 01/08/21 0531 01/09/21 0514  NA 128*  --  131*  134* 136  K 3.7  --  3.3* 3.5 3.2*  CL 94*  --  99 103 100  CO2 22  --  27 26 29   GLUCOSE 301*  --  227* 181* 131*  BUN 10  --  10 6* 6*  CREATININE 0.61 0.79 0.58 0.52 0.48  CALCIUM 8.6*  --  8.0* 8.2* 8.1*  MG  --   --  1.5* 1.9 1.5*  PHOS  --   --   --  2.5 3.2    GFR: Estimated Creatinine Clearance: 81.8 mL/min (by C-G formula based on SCr of 0.48 mg/dL).  Liver Function Tests: Recent Labs  Lab 01/06/21 1840 01/07/21 0418 01/08/21 0531 01/09/21 0514  AST 29 21  --  18  ALT 36 25  --  18  ALKPHOS 72 70  --  85  BILITOT 0.6 0.6  --  0.4  PROT 7.4 6.2*  --  5.8*  ALBUMIN 3.8 3.1* 2.9* 2.9*    CBG: Recent Labs  Lab 01/08/21 2039 01/09/21 0806 01/09/21 1130 01/09/21 1439 01/09/21 1607  GLUCAP 310* 124* 114* 102* 115*     Recent Results (from the past 240 hour(s))  Resp Panel by RT-PCR (Flu Mattheo Swindle&B, Covid) Nasopharyngeal Swab     Status: None   Collection Time: 01/06/21  6:12 PM   Specimen: Nasopharyngeal Swab; Nasopharyngeal(NP) swabs in vial transport medium  Result Value Ref Range Status   SARS Coronavirus 2 by RT PCR NEGATIVE NEGATIVE Final    Comment: (NOTE) SARS-CoV-2 target nucleic acids are NOT DETECTED.  The SARS-CoV-2 RNA is generally detectable in upper  respiratory specimens during the acute phase of infection. The lowest concentration of SARS-CoV-2 viral copies this assay can detect is 138 copies/mL. Chisom Aust negative result does not preclude SARS-Cov-2 infection and should not be used as the sole basis for treatment or other patient management decisions. Jerico Grisso negative result may occur with  improper specimen collection/handling, submission of specimen other than nasopharyngeal swab, presence of viral mutation(s) within the areas targeted by this assay, and inadequate number of viral copies(<138 copies/mL). Ivonna Kinnick negative result must be combined with clinical observations, patient history, and epidemiological information. The expected result is Negative.  Fact Sheet for Patients:  EntrepreneurPulse.com.au  Fact Sheet for Healthcare Providers:  IncredibleEmployment.be  This test is no t yet approved or cleared by the [place] FDA and  has been authorized for detection and/or diagnosis of SARS-CoV-2 by FDA under an Emergency Use Authorization (EUA). This EUA will remain  in effect (meaning this test can be used) for the duration of the COVID-19 declaration under Section 564(b)(1) of the Act, 21 U.S.C.section 360bbb-3(b)(1), unless the authorization is terminated  or revoked sooner.       Influenza Evlyn Amason by PCR NEGATIVE NEGATIVE Final   Influenza B by PCR NEGATIVE NEGATIVE Final    Comment: (NOTE) The Xpert Xpress SARS-CoV-2/FLU/RSV plus assay is intended as an aid in the diagnosis of influenza from Nasopharyngeal swab specimens and should not be used as Candyce Gambino sole basis for treatment. Nasal washings and aspirates are unacceptable for Xpert Xpress SARS-CoV-2/FLU/RSV testing.  Fact Sheet for Patients: EntrepreneurPulse.com.au  Fact Sheet for Healthcare Providers: IncredibleEmployment.be  This test is not yet approved or cleared by the [place] FDA and has been  authorized for detection and/or diagnosis of SARS-CoV-2 by FDA under an Emergency Use Authorization (EUA). This EUA will remain in effect (meaning this test can be used) for the duration of the COVID-19 declaration under Section  564(b)(1) of the Act, 21 U.S.C. section 360bbb-3(b)(1), unless the authorization is terminated or revoked.  Performed at [facility], [place] [address], [place], [place] [address]   Blood culture (routine x 2)     Status: None (Preliminary result)   Collection Time: 01/06/21  6:40 PM   Specimen: BLOOD  Result Value Ref Range Status   Specimen Description   Final    BLOOD RIGHT ANTECUBITAL Performed at [facility] [address] [place], [place] [address]    Special Requests   Final    BOTTLES DRAWN AEROBIC AND ANAEROBIC Blood Culture results may not be optimal due to an inadequate volume of blood received in culture bottles Performed at [facility] [address] [place], [place] [address]    Culture   Final    NO GROWTH 3 DAYS Performed at [facility], [place] [address], [place], [place] [address]    Report Status PENDING  Incomplete  Urine Culture     Status: Abnormal   Collection Time: 01/06/21  9:45 PM   Specimen: Urine, Clean Catch  Result Value Ref Range Status   Specimen Description   Final    URINE, CLEAN CATCH Performed at [facility], [place] [address], [place], [place] [address]    Special Requests   Final    NONE Performed at [facility], [place] [address], [place], [place] [address]    Culture (Drexel Ivey)  Final    20,000 COLONIES/mL ESCHERICHIA COLI 80,000 COLONIES/mL LACTOBACILLUS SPECIES Standardized susceptibility testing for this organism is not available. Performed at [facility], [place] [address], [place], [place] [address]    Report Status 01/09/2021 FINAL  Final   Organism ID, Bacteria ESCHERICHIA COLI (Brytani Voth)  Final       Susceptibility   Escherichia coli - MIC*    AMPICILLIN 8 SENSITIVE Sensitive     CEFAZOLIN <=4 SENSITIVE Sensitive     CEFEPIME <=0.12 SENSITIVE Sensitive     CEFTRIAXONE <=0.25 SENSITIVE Sensitive     CIPROFLOXACIN <=0.25 SENSITIVE Sensitive     GENTAMICIN <=1 SENSITIVE Sensitive     IMIPENEM <=0.25 SENSITIVE Sensitive     NITROFURANTOIN <=16 SENSITIVE Sensitive     TRIMETH/SULFA <=20 SENSITIVE Sensitive     AMPICILLIN/SULBACTAM <=2 SENSITIVE Sensitive     PIP/TAZO <=4 SENSITIVE Sensitive     * 20,000 COLONIES/mL ESCHERICHIA COLI  Blood culture (routine x 2)     Status: None (Preliminary result)   Collection Time: 01/07/21 12:39 AM   Specimen: Right Antecubital; Blood  Result Value Ref Range Status   Specimen Description   Final    RIGHT ANTECUBITAL BLOOD Performed at [facility] [address], [place], [place] [address]    Special Requests   Final    BOTTLES DRAWN AEROBIC AND ANAEROBIC Blood Culture adequate volume Performed at [facility] [address] [place], [place] [address]    Culture   Final    NO GROWTH 2 DAYS Performed at [facility] [address] [place], [place] [address]    Report Status PENDING  Incomplete  Aerobic/Anaerobic Culture w Gram Stain (surgical/deep wound)     Status: None (Preliminary result)   Collection Time: 01/07/21  1:42 PM   Specimen: Abscess  Result Value Ref Range Status   Specimen Description   Final    ABSCESS RIGHT AXILLA Performed at [facility]  [address], [place], [place] [address]    Special Requests   Final    NONE Performed at [facility], [place] [address] [place], [place] [address]    Gram Stain   Final    FEW WBC Bridges,BOTH PMN AND MONONUCLEAR MODERATE GRAM POSITIVE COCCI Performed at [facility], [place] [address] [place], [place] [address]    Culture   Final    FEW STREPTOCOCCUS AGALACTIAE TESTING AGAINST S. AGALACTIAE NOT  ROUTINELY PERFORMED DUE TO PREDICTABILITY OF AMP/PEN/VAN SUSCEPTIBILITY. NO ANAEROBES ISOLATED; CULTURE IN PROGRESS FOR 5 DAYS    Report Status PENDING  Incomplete         Radiology Studies: No results found.      Scheduled Meds:  amitriptyline  25 mg Oral QHS   Chlorhexidine Gluconate Cloth  6 each Topical Daily   cholecalciferol  5,000 Units Oral Daily   fentaNYL       heparin  5,000 Units Subcutaneous Q8H   insulin aspart  0-9 Units Subcutaneous TID AC & HS   insulin aspart  4 Units Subcutaneous TID WC   insulin glargine-yfgn  45 Units Subcutaneous Daily   loratadine  10 mg Oral Daily   losartan  25 mg Oral Daily   multivitamin with minerals  1 tablet Oral Daily   promethazine       Ensure Max Protein  11 oz Oral Daily   saccharomyces boulardii  250 mg Oral BID   sertraline  50 mg Oral Daily   simvastatin  40 mg Oral QHS   Continuous Infusions:  cefTRIAXone (ROCEPHIN)  IV 2 g (01/09/21 1136)   metronidazole 500 mg (01/09/21 1134)     LOS: 3 days    Time spent: over 30 min    Fayrene Helper, MD [facility]   To contact the attending provider between 7A-7P or the covering provider during after hours 7P-7A, please log into the web site [email or web address] and access using universal [place] password for that web site. If you do not have the password, please call the hospital operator.  01/09/2021, 5:29 PM

## 2021-01-09 NOTE — Anesthesia Preprocedure Evaluation (Signed)
Anesthesia Evaluation  Patient identified by MRN, date of birth, ID band Patient awake and Patient confused    Reviewed: Allergy & Precautions, NPO status , Patient's Chart, lab work & pertinent test results, reviewed documented beta blocker date and time   History of Anesthesia Complications (+) PONV and history of anesthetic complications  Airway Mallampati: II  TM Distance: >3 FB Neck ROM: Full    Dental no notable dental hx. (+) Teeth Intact, Dental Advisory Given, Caps   Pulmonary former smoker,    Pulmonary exam normal breath sounds clear to auscultation       Cardiovascular hypertension, Pt. on medications Normal cardiovascular exam Rhythm:Regular Rate:Normal     Neuro/Psych negative neurological ROS  negative psych ROS   GI/Hepatic negative GI ROS, Neg liver ROS,   Endo/Other  diabetes, Well Controlled, Type 2, Insulin DependentRight breast abscess S/P lumpectomy followed by hematoma Right breast Ca Obesity Hyperlipidemia  Renal/GU Renal diseaseHx/o renal calculi  negative genitourinary   Musculoskeletal negative musculoskeletal ROS (+)   Abdominal   Peds  Hematology negative hematology ROS (+)   Anesthesia Other Findings   Reproductive/Obstetrics                             Anesthesia Physical Anesthesia Plan  ASA: 2  Anesthesia Plan: General   Post-op Pain Management:    Induction: Intravenous  PONV Risk Score and Plan: 4 or greater and Treatment may vary due to age or medical condition and Ondansetron  Airway Management Planned: LMA  Additional Equipment:   Intra-op Plan:   Post-operative Plan: Extubation in OR  Informed Consent: I have reviewed the patients History and Physical, chart, labs and discussed the procedure including the risks, benefits and alternatives for the proposed anesthesia with the patient or authorized representative who has indicated  his/her understanding and acceptance.     Dental advisory given  Plan Discussed with: CRNA and Anesthesiologist  Anesthesia Plan Comments:         Anesthesia Quick Evaluation

## 2021-01-09 NOTE — Anesthesia Postprocedure Evaluation (Signed)
Anesthesia Post Note  Patient: [name]  Procedure(s) Performed: incision drainage right breast abscess (Right: Breast)     Patient location during evaluation: PACU Anesthesia Type: General Level of consciousness: awake and alert and oriented Pain management: pain level controlled Vital Signs Assessment: post-procedure vital signs reviewed and stable Respiratory status: spontaneous breathing, nonlabored ventilation and respiratory function stable Cardiovascular status: blood pressure returned to baseline Postop Assessment: no apparent nausea or vomiting Anesthetic complications: no   No notable events documented.  Last Vitals:  Vitals:   01/09/21 1445 01/09/21 1500  BP: (!) 161/70 (!) 160/82  Pulse: 92 88  Resp: 18 16  Temp:    SpO2: 96% 96%    Last Pain:  Vitals:   01/09/21 1445  TempSrc:   PainSc: 0-No pain                 Marthenia Rolling

## 2021-01-09 NOTE — Progress Notes (Addendum)
Received report for surgery from [place], [place]

## 2021-01-09 NOTE — Op Note (Signed)
Preoperative diagnosis: right axillary abscess Postoperative diagnosis: same as above Procedure: Incision and drainage of right axillary abscess Surgeon: Dr. Serita Grammes Anesthesia: General Estimated blood loss: Minimal Complications: None Drains: None Specimens: Cultures to microbiology Sponge needle count was correct completion Disposition to recovery stable condition  Indications: This is a 65 year old female who underwent lumpectomy sentinel node biopsy for breast cancer.  This was complicated by an upper inner quadrant infection that resolved.  I then placed a port.  She began chemotherapy and soon thereafter began having fevers.  She does appear actually have diverticulitis by CT scan and exam but has cellulitis over her right lateral chest wall.  A CT scan showed a right axillary collection as well as in the outer part of the breast.  This was aspirated by interventional radiology and has growing strep.  Due to the fact that she is not getting better I discussed going to the operating room and draining this operatively today.  Procedure: After informed consent was obtained the patient was taken the operating.  She was already on antibiotics.  She was placed under general anesthesia without complication.  She had SCDs in place.  She was prepped and draped in the standard sterile surgical fashion.  A surgical timeout was then performed.  I first used the ultrasound.  I did not any identify any collections in the breast.  Near her right axillary incision in the axilla and inferior to that there were a couple of fluid collections.  I then infiltrated Marcaine over her old incision.  I reentered this.  I there was a small cavity in the breast but a larger cavity with purulence was present in the axilla.  I widely drained this after taking cultures.  I then obtained hemostasis.  I irrigated it.  I then packed it.  She tolerated this well was extubated and transferred recovery stable.

## 2021-01-10 ENCOUNTER — Encounter (HOSPITAL_COMMUNITY): Payer: Self-pay | Admitting: General Surgery

## 2021-01-10 DIAGNOSIS — R6510 Systemic inflammatory response syndrome (SIRS) of non-infectious origin without acute organ dysfunction: Secondary | ICD-10-CM | POA: Diagnosis not present

## 2021-01-10 DIAGNOSIS — I10 Essential (primary) hypertension: Secondary | ICD-10-CM

## 2021-01-10 DIAGNOSIS — E119 Type 2 diabetes mellitus without complications: Secondary | ICD-10-CM

## 2021-01-10 DIAGNOSIS — A419 Sepsis, unspecified organism: Secondary | ICD-10-CM

## 2021-01-10 DIAGNOSIS — N611 Abscess of the breast and nipple: Secondary | ICD-10-CM

## 2021-01-10 LAB — COMPREHENSIVE METABOLIC PANEL
ALT: 19 U/L (ref 0–44)
AST: 21 U/L (ref 15–41)
Albumin: 3 g/dL — ABNORMAL LOW (ref 3.5–5.0)
Alkaline Phosphatase: 85 U/L (ref 38–126)
Anion gap: 7 (ref 5–15)
BUN: <5 mg/dL — ABNORMAL LOW (ref 8–23)
CO2: 29 mmol/L (ref 22–32)
Calcium: 8.2 mg/dL — ABNORMAL LOW (ref 8.9–10.3)
Chloride: 100 mmol/L (ref 98–111)
Creatinine, Ser: 0.59 mg/dL (ref 0.44–1.00)
GFR, Estimated: >60 mL/min (ref >60)
Glucose, Bld: 115 mg/dL — ABNORMAL HIGH (ref 70–99)
Potassium: 3.5 mmol/L (ref 3.5–5.1)
Sodium: 136 mmol/L (ref 135–145)
Total Bilirubin: 0.2 mg/dL — ABNORMAL LOW (ref 0.3–1.2)
Total Protein: 6.1 g/dL — ABNORMAL LOW (ref 6.5–8.1)

## 2021-01-10 LAB — CBC WITH DIFFERENTIAL/PLATELET
Abs Immature Granulocytes: 0.83 10*3/uL — ABNORMAL HIGH (ref 0.00–0.07)
Basophils Absolute: 0.1 10*3/uL (ref 0.0–0.1)
Basophils Relative: 1 %
Eosinophils Absolute: 0 10*3/uL (ref 0.0–0.5)
Eosinophils Relative: 0 %
HCT: 30.4 % — ABNORMAL LOW (ref 36.0–46.0)
Hemoglobin: 9.8 g/dL — ABNORMAL LOW (ref 12.0–15.0)
Immature Granulocytes: 8 %
Lymphocytes Relative: 21 %
Lymphs Abs: 2.4 10*3/uL (ref 0.7–4.0)
MCH: 29.5 pg (ref 26.0–34.0)
MCHC: 32.2 g/dL (ref 30.0–36.0)
MCV: 91.6 fL (ref 80.0–100.0)
Monocytes Absolute: 0.9 10*3/uL (ref 0.1–1.0)
Monocytes Relative: 8 %
Neutro Abs: 6.9 10*3/uL (ref 1.7–7.7)
Neutrophils Relative %: 62 %
Platelets: 194 10*3/uL (ref 150–400)
RBC: 3.32 MIL/uL — ABNORMAL LOW (ref 3.87–5.11)
RDW: 13.9 % (ref 11.5–15.5)
WBC: 11.1 10*3/uL — ABNORMAL HIGH (ref 4.0–10.5)
nRBC: 0.9 % — ABNORMAL HIGH (ref 0.0–0.2)

## 2021-01-10 LAB — PHOSPHORUS: Phosphorus: 4.3 mg/dL (ref 2.5–4.6)

## 2021-01-10 LAB — GLUCOSE, CAPILLARY
Glucose-Capillary: 107 mg/dL — ABNORMAL HIGH (ref 70–99)
Glucose-Capillary: 123 mg/dL — ABNORMAL HIGH (ref 70–99)
Glucose-Capillary: 152 mg/dL — ABNORMAL HIGH (ref 70–99)
Glucose-Capillary: 267 mg/dL — ABNORMAL HIGH (ref 70–99)
Glucose-Capillary: 74 mg/dL (ref 70–99)

## 2021-01-10 LAB — MAGNESIUM: Magnesium: 1.6 mg/dL — ABNORMAL LOW (ref 1.7–2.4)

## 2021-01-10 MED ORDER — FLUTICASONE PROPIONATE 50 MCG/ACT NA SUSP
2.0000 | Freq: Every day | NASAL | Status: DC
  Administered 2021-01-10 – 2021-01-12 (×3): 2 via NASAL
  Filled 2021-01-10 (×2): qty 16

## 2021-01-10 MED ORDER — SALINE SPRAY 0.65 % NA SOLN
1.0000 | NASAL | Status: DC | PRN
  Filled 2021-01-10: qty 44

## 2021-01-10 NOTE — Progress Notes (Signed)
1 Day Post-Op   Subjective/Chief Complaint: Feels better after draining axilla   Objective: Vital signs in last 24 hours: Temp:  [97.5 F (36.4 C)-99.1 F (37.3 C)] 97.5 F (36.4 C) (10/21 0519) Pulse Rate:  [81-95] 86 (10/21 0519) Resp:  [9-20] 14 (10/21 0519) BP: (132-174)/(65-84) 137/71 (10/21 0519) SpO2:  [91 %-96 %] 91 % (10/21 0519) Weight:  [92.3 kg] 92.3 kg (10/20 1317) Last BM Date: 01/09/21  Intake/Output from previous day: 10/20 0701 - 10/21 0700 In: 620 [P.O.:120; I.V.:400; IV Piggyback:100] Out: 0  Intake/Output this shift: No intake/output data recorded.  GI soft nontender Right axilla/breast erythema is better, less tender, dressing changed today with some murky fluid present, repacked  Lab Results:  Recent Labs    01/09/21 0514 01/10/21 0530  WBC 13.3* 11.1*  HGB 9.7* 9.8*  HCT 30.1* 30.4*  PLT 207 194   BMET Recent Labs    01/09/21 0514 01/10/21 0530  NA 136 136  K 3.2* 3.5  CL 100 100  CO2 29 29  GLUCOSE 131* 115*  BUN 6* <5*  CREATININE 0.48 0.59  CALCIUM 8.1* 8.2*   PT/INR No results for input(s): LABPROT, INR in the last 72 hours. ABG No results for input(s): PHART, HCO3 in the last 72 hours.  Invalid input(s): PCO2, PO2  Studies/Results: No results found.  Anti-infectives: Anti-infectives (From admission, onward)    Start     Dose/Rate Route Frequency Ordered Stop   01/08/21 1200  cefTRIAXone (ROCEPHIN) 2 g in sodium chloride 0.9 % 100 mL IVPB        2 g 200 mL/hr over 30 Minutes Intravenous Every 24 hours 01/08/21 1118     01/07/21 2200  vancomycin (VANCOREADY) IVPB 1250 mg/250 mL  Status:  Discontinued        1,250 mg 166.7 mL/hr over 90 Minutes Intravenous Every 24 hours 01/07/21 0030 01/08/21 1118   01/07/21 0400  ceFEPIme (MAXIPIME) 2 g in sodium chloride 0.9 % 100 mL IVPB  Status:  Discontinued        2 g 200 mL/hr over 30 Minutes Intravenous Every 8 hours 01/07/21 0030 01/08/21 1118   01/07/21 0030  ceFEPIme  (MAXIPIME) 2 g in sodium chloride 0.9 % 100 mL IVPB  Status:  Discontinued        2 g 200 mL/hr over 30 Minutes Intravenous  Once 01/07/21 0016 01/07/21 0021   01/07/21 0030  metroNIDAZOLE (FLAGYL) IVPB 500 mg        500 mg 100 mL/hr over 60 Minutes Intravenous Every 12 hours 01/07/21 0016 01/14/21 0029   01/07/21 0030  vancomycin (VANCOCIN) IVPB 1000 mg/200 mL premix  Status:  Discontinued        1,000 mg 200 mL/hr over 60 Minutes Intravenous  Once 01/07/21 0016 01/07/21 0021   01/06/21 1930  vancomycin (VANCOCIN) IVPB 1000 mg/200 mL premix        1,000 mg 200 mL/hr over 60 Minutes Intravenous  Once 01/06/21 1916 01/06/21 2238   01/06/21 1930  ceFEPIme (MAXIPIME) 2 g in sodium chloride 0.9 % 100 mL IVPB        2 g 200 mL/hr over 30 Minutes Intravenous  Once 01/06/21 1921 01/06/21 2054       Assessment/Plan: Fever on chemotherapy Breast cancer POD 1 right axillary abscess drainage - port placed 12/30/20 does not appear infected - s/p right breast fluid collection aspiration 10/18 >> culture growing strep agalactiae Continue antibiotics -I think we have source control now, there  were no other collections present by [place] or by exam in or.  Will do bid dressing changes   Sigmoid diverticulitis - Continue antibiotics -this appears resolved   ID - rocephing, flagyl WBC down 11.1 FEN - carb mod VTE - sq heparin Foley - none   HTN HLD DM   Rolm Bookbinder 01/10/2021

## 2021-01-10 NOTE — Progress Notes (Signed)
PROGRESS NOTE    [name]  TOI:[id number] DOB: Feb 11, 1956 DOA: 01/06/2021 PCP: Kimberly Contras, MD   Chief Complaint  Patient presents with   cancer patient   Emesis   Dysuria   Fever    Brief Narrative:   65 y.o. female with history of breast cancer recently started on chemotherapy about [name] week ago day before that patient had [name] placed on the right side presents to the ER because of fever chills and weakness.  Patient states after her chemotherapy last week she did well for the next 2 days then she has had some constipation and she took over-the-counter medications for constipation following which she started having multiple episodes of loose stools and dysuria started feeling fever chills weakness and at this point decided to come to the ER.  Has nausea no vomiting and has been having some left lower quadrant abdominal discomfort.   Patient states after her lumpectomy in August she did have some fluid collection and also had been treated with antibiotics for possible infection.   ED Course: In the ER patient was febrile with temperature 102 F tachycardic with lab work showing sodium of 128 blood glucose of 302 anion gap of 12 lactic acid of 2.1 CBC unremarkable COVID test negative.  Patient underwent CT scan of the chest and abdomen pelvis.  Showed features concerning for fluid stranding around the Port-[name] which could be postoperative change or developing infection there is also fluid collection on the right breast area which is also concerning for possible developing infection and also acute sigmoid diverticulitis.  Patient was started on empiric antibiotics fluids admitted for further work-up.  UA is concerning for UTI.     Assessment & Plan:   Principal Problem:   SIRS (systemic inflammatory response syndrome) (HCC) Active Problems:   Malignant neoplasm of upper-inner quadrant of right breast in female, estrogen receptor positive ([place])   Uncontrolled type 2  diabetes mellitus with hyperglycemia ([place])  Sepsis secondary to skin soft tissue infection of right breast and Diverticulitis  Right Breast Abscess  Diverticulitis Fever, tachycardia on presentation in the setting of multiple infectious sources.  Now developed leukocytosis. Concern for infection related to recently placed port (10/10), but based on exam, this seems less likely - general surgery and oncology in agreement Now s/p US guided drainage of 40 ml serosanguinous fluid from right axillary fluid collection S/p  Incision and drainage of right axillary abscess 10/20 per general surgery Culture showing strep agalactiae 10/20 culture with gram positive cocci Abx narrowed to ceftriaxone/flagyl   Blood cx with ng Urine culture showing e. Coli and lactobacillus, unclear significance  Breast Cancer Following outpatient with Dr. Jana Bridges, she's s/p lumpectomy/sentinel LN sampling on 8/16  Day 9 cycle 1 cyclophosphamide and docetaxel - s/p pegfilgastrim 11/13 Has follow up 01/21/2021  T2DM Continue basal (45 units) and bolus (4 units TID with meals) along with SSI A1c 8.7 in 11/2020  HTN Losartan (pt notes for renal protection)  Dyslipidemia statin  DVT prophylaxis: heparin Code Status: full  Family Communication: none at bedside Disposition:   Status is: Inpatient  Remains inpatient appropriate because: continued need for IV abx       Consultants:  Surgery IR oncology  Procedures:  IMPRESSION: Successful US guided of 40 mL of serosanguineous fluid from RIGHT axillary fluid collection.   Kimberly Prevette representative aspirated sample was sent to the laboratory as requested by the ordering clinical team.  Antimicrobials: Anti-infectives (From admission, onward)    Start  Dose/Rate Route Frequency Ordered Stop   01/08/21 1200  cefTRIAXone (ROCEPHIN) 2 g in sodium chloride 0.9 % 100 mL IVPB        2 g 200 mL/hr over 30 Minutes Intravenous Every 24 hours 01/08/21 1118      01/07/21 2200  vancomycin (VANCOREADY) IVPB 1250 mg/250 mL  Status:  Discontinued        1,250 mg 166.7 mL/hr over 90 Minutes Intravenous Every 24 hours 01/07/21 0030 01/08/21 1118   01/07/21 0400  ceFEPIme (MAXIPIME) 2 g in sodium chloride 0.9 % 100 mL IVPB  Status:  Discontinued        2 g 200 mL/hr over 30 Minutes Intravenous Every 8 hours 01/07/21 0030 01/08/21 1118   01/07/21 0030  ceFEPIme (MAXIPIME) 2 g in sodium chloride 0.9 % 100 mL IVPB  Status:  Discontinued        2 g 200 mL/hr over 30 Minutes Intravenous  Once 01/07/21 0016 01/07/21 0021   01/07/21 0030  metroNIDAZOLE (FLAGYL) IVPB 500 mg        500 mg 100 mL/hr over 60 Minutes Intravenous Every 12 hours 01/07/21 0016 01/14/21 0029   01/07/21 0030  vancomycin (VANCOCIN) IVPB 1000 mg/200 mL premix  Status:  Discontinued        1,000 mg 200 mL/hr over 60 Minutes Intravenous  Once 01/07/21 0016 01/07/21 0021   01/06/21 1930  vancomycin (VANCOCIN) IVPB 1000 mg/200 mL premix        1,000 mg 200 mL/hr over 60 Minutes Intravenous  Once 01/06/21 1916 01/06/21 2238   01/06/21 1930  ceFEPIme (MAXIPIME) 2 g in sodium chloride 0.9 % 100 mL IVPB        2 g 200 mL/hr over 30 Minutes Intravenous  Once 01/06/21 1921 01/06/21 2054          Subjective: Feels better post op  Objective: Vitals:   01/09/21 2148 01/10/21 0519 01/10/21 0936 01/10/21 1443  BP: 132/65 137/71 (!) 154/86 134/64  Pulse: 86 86 91 85  Resp:  14 16 15   Temp: 99.1 F (37.3 C) (!) 97.5 F (36.4 C)  98.8 F (37.1 C)  TempSrc: Oral Oral  Oral  SpO2: 93% 91% 92% (!) 89%  Weight:      Height:        Intake/Output Summary (Last 24 hours) at 01/10/2021 1911 Last data filed at 01/10/2021 1233 Gross per 24 hour  Intake 580 ml  Output --  Net 580 ml   Filed Weights   01/06/21 1718 01/07/21 0026 01/09/21 1317  Weight: 88.5 kg 92.3 kg 92.3 kg    Examination:  General exam: NAD Respiratory system: unlbaored Cardiovascular system:RRR Gastrointestinal  system: no TTP, much improved Central nervous system: [name]&Ox3 Extremities: no LEE Skin: R breast examined with RN present, tender to palpation, erythema relatively stable, dressing intact - port site looks good  Data Reviewed: I have personally reviewed following labs and imaging studies  CBC: Recent Labs  Lab 01/07/21 0039 01/07/21 0418 01/08/21 0531 01/09/21 0514 01/10/21 0530  WBC 10.3 10.1 16.5* 13.3* 11.1*  NEUTROABS  --  5.8 9.8* 8.3* 6.9  HGB 11.8* 10.7* 9.9* 9.7* 9.8*  HCT 36.4 32.4* 30.4* 30.1* 30.4*  MCV 91.5 89.0 91.0 90.9 91.6  PLT 230 209 206 207 326    Basic Metabolic Panel: Recent Labs  Lab 01/06/21 1840 01/07/21 0039 01/07/21 0418 01/08/21 0531 01/09/21 0514 01/10/21 0530  NA 128*  --  131* 134* 136 136  K 3.7  --  3.3* 3.5 3.2* 3.5  CL 94*  --  99 103 100 100  CO2 22  --  27 26 29 29   GLUCOSE 301*  --  227* 181* 131* 115*  BUN 10  --  10 6* 6* <5*  CREATININE 0.61 0.79 0.58 0.52 0.48 0.59  CALCIUM 8.6*  --  8.0* 8.2* 8.1* 8.2*  MG  --   --  1.5* 1.9 1.5* 1.6*  PHOS  --   --   --  2.5 3.2 4.3    GFR: Estimated Creatinine Clearance: 81.8 mL/min (by C-G formula based on SCr of 0.59 mg/dL).  Liver Function Tests: Recent Labs  Lab 01/06/21 1840 01/07/21 0418 01/08/21 0531 01/09/21 0514 01/10/21 0530  AST 29 21  --  18 21  ALT 36 25  --  18 19  ALKPHOS 72 70  --  85 85  BILITOT 0.6 0.6  --  0.4 0.2*  PROT 7.4 6.2*  --  5.8* 6.1*  ALBUMIN 3.8 3.1* 2.9* 2.9* 3.0*    CBG: Recent Labs  Lab 01/09/21 1607 01/09/21 2151 01/10/21 0834 01/10/21 1251 01/10/21 1637  GLUCAP 115* 145* 107* 267* 152*     Recent Results (from the past 240 hour(s))  Resp Panel by RT-PCR (Flu Latrelle Bazar&B, Covid) Nasopharyngeal Swab     Status: None   Collection Time: 01/06/21  6:12 PM   Specimen: Nasopharyngeal Swab; Nasopharyngeal(NP) swabs in vial transport medium  Result Value Ref Range Status   SARS Coronavirus 2 by RT PCR NEGATIVE NEGATIVE Final    Comment:  (NOTE) SARS-CoV-2 target nucleic acids are NOT DETECTED.  The SARS-CoV-2 RNA is generally detectable in upper respiratory specimens during the acute phase of infection. The lowest concentration of SARS-CoV-2 viral copies this assay can detect is 138 copies/mL. Latrina Guttman negative result does not preclude SARS-Cov-2 infection and should not be used as the sole basis for treatment or other patient management decisions. Geraldene Eisel negative result may occur with  improper specimen collection/handling, submission of specimen other than nasopharyngeal swab, presence of viral mutation(s) within the areas targeted by this assay, and inadequate number of viral copies(<138 copies/mL). Harlee Eckroth negative result must be combined with clinical observations, patient history, and epidemiological information. The expected result is Negative.  Fact Sheet for Patients:  EntrepreneurPulse.com.au  Fact Sheet for Healthcare Providers:  IncredibleEmployment.be  This test is no t yet approved or cleared by the [place] FDA and  has been authorized for detection and/or diagnosis of SARS-CoV-2 by FDA under an Emergency Use Authorization (EUA). This EUA will remain  in effect (meaning this test can be used) for the duration of the COVID-19 declaration under Section 564(b)(1) of the Act, 21 U.S.C.section 360bbb-3(b)(1), unless the authorization is terminated  or revoked sooner.       Influenza Lilee Aldea by PCR NEGATIVE NEGATIVE Final   Influenza B by PCR NEGATIVE NEGATIVE Final    Comment: (NOTE) The Xpert Xpress SARS-CoV-2/FLU/RSV plus assay is intended as an aid in the diagnosis of influenza from Nasopharyngeal swab specimens and should not be used as Phinley Schall sole basis for treatment. Nasal washings and aspirates are unacceptable for Xpert Xpress SARS-CoV-2/FLU/RSV testing.  Fact Sheet for Patients: EntrepreneurPulse.com.au  Fact Sheet for Healthcare  Providers: IncredibleEmployment.be  This test is not yet approved or cleared by the [place] FDA and has been authorized for detection and/or diagnosis of SARS-CoV-2 by FDA under an Emergency Use Authorization (EUA). This EUA will remain in effect (meaning this test can be used)  for the duration of the COVID-19 declaration under Section 564(b)(1) of the Act, 21 U.S.C. section 360bbb-3(b)(1), unless the authorization is terminated or revoked.  Performed at [facility], [place] [address] [place], [place] [address]   Blood culture (routine x 2)     Status: None (Preliminary result)   Collection Time: 01/06/21  6:40 PM   Specimen: BLOOD  Result Value Ref Range Status   Specimen Description   Final    BLOOD RIGHT ANTECUBITAL Performed at [facility] [address], [place], [place] [address]    Special Requests   Final    BOTTLES DRAWN AEROBIC AND ANAEROBIC Blood Culture results may not be optimal due to an inadequate volume of blood received in culture bottles Performed at [facility] [address], [place], [place] [address]    Culture   Final    NO GROWTH 4 DAYS Performed at [facility], [place] [address], [place], [place] [address]    Report Status PENDING  Incomplete  Urine Culture     Status: Abnormal   Collection Time: 01/06/21  9:45 PM   Specimen: Urine, Clean Catch  Result Value Ref Range Status   Specimen Description   Final    URINE, CLEAN CATCH Performed at [facility], [place] [address], [place], [place] [address]    Special Requests   Final    NONE Performed at [facility], [place] [address], [place], [place] [address]    Culture (Kemari Mares)  Final    20,000 COLONIES/mL ESCHERICHIA COLI 80,000 COLONIES/mL LACTOBACILLUS SPECIES Standardized susceptibility testing for this organism is not available. Performed at [facility], [place] [address], [place], [place] [address]    Report Status 01/09/2021 FINAL  Final   Organism ID, Bacteria ESCHERICHIA COLI (Eliel Dudding)  Final      Susceptibility   Escherichia coli - MIC*    AMPICILLIN 8 SENSITIVE Sensitive     CEFAZOLIN <=4 SENSITIVE Sensitive     CEFEPIME <=0.12 SENSITIVE Sensitive     CEFTRIAXONE <=0.25 SENSITIVE Sensitive     CIPROFLOXACIN <=0.25 SENSITIVE Sensitive     GENTAMICIN <=1 SENSITIVE Sensitive     IMIPENEM <=0.25 SENSITIVE Sensitive     NITROFURANTOIN <=16 SENSITIVE Sensitive     TRIMETH/SULFA <=20 SENSITIVE Sensitive     AMPICILLIN/SULBACTAM <=2 SENSITIVE Sensitive     PIP/TAZO <=4 SENSITIVE Sensitive     * 20,000 COLONIES/mL ESCHERICHIA COLI  Blood culture (routine x 2)     Status: None (Preliminary result)   Collection Time: 01/07/21 12:39 AM   Specimen: Right Antecubital; Blood  Result Value Ref Range Status   Specimen Description   Final    RIGHT ANTECUBITAL BLOOD Performed at [facility] [address], [place], [place] [address]    Special Requests   Final    BOTTLES DRAWN AEROBIC AND ANAEROBIC Blood Culture adequate volume Performed at [facility] [address], [place], [place] [address]    Culture   Final    NO GROWTH 3 DAYS Performed at [facility], [place] [address] [place], [place] [address]    Report Status PENDING  Incomplete  Aerobic/Anaerobic Culture w Gram Stain (surgical/deep wound)     Status: None (Preliminary result)   Collection Time: 01/07/21  1:42 PM   Specimen: Abscess  Result Value Ref Range Status   Specimen Description   Final    ABSCESS RIGHT  AXILLA Performed at [facility], [place] [address], [place], [place] [address]    Special Requests   Final    NONE Performed at [facility], [place] [address], [place], [place] [address]    Gram Stain   Final    FEW WBC PRESENT,BOTH PMN AND MONONUCLEAR MODERATE GRAM POSITIVE COCCI Performed at [facility], [place] [address] [place], [place] [address]    Culture   Final    FEW STREPTOCOCCUS AGALACTIAE TESTING AGAINST S. AGALACTIAE NOT ROUTINELY PERFORMED DUE TO PREDICTABILITY OF AMP/PEN/VAN SUSCEPTIBILITY. NO ANAEROBES ISOLATED; CULTURE IN PROGRESS FOR 5 DAYS    Report Status PENDING  Incomplete  Aerobic/Anaerobic Culture w Gram Stain (surgical/deep wound)     Status: None (Preliminary result)   Collection Time: 01/09/21  2:16 PM   Specimen: PATH Other; Tissue  Result Value Ref Range Status   Specimen Description   Final    ABSCESS RIGHT BREAST Performed at [facility], [place] [address], [place], [place] [address]    Special Requests   Final    NONE Performed at [facility], [place], [place] [address] [place], [place] [address]    Gram Stain   Final    NO SQUAMOUS EPITHELIAL CELLS SEEN FEW WBC SEEN FEW GRAM POSITIVE COCCI    Culture   Final    CULTURE REINCUBATED FOR BETTER GROWTH Performed at [facility], [place] [address] [place], [place] [address]    Report Status PENDING  Incomplete         Radiology Studies: No results found.      Scheduled Meds:  amitriptyline  25 mg Oral QHS   Chlorhexidine Gluconate Cloth  6 each Topical Daily   cholecalciferol  5,000 Units Oral Daily   fluticasone  2 spray Each Nare QHS   heparin  5,000 Units Subcutaneous Q8H   insulin aspart  0-9 Units Subcutaneous TID AC & HS   insulin aspart  4 Units Subcutaneous TID WC   insulin glargine-yfgn  45 Units Subcutaneous Daily   loratadine  10 mg Oral Daily   losartan  25 mg Oral Daily   multivitamin with minerals  1 tablet Oral Daily   Ensure Max Protein  11 oz Oral Daily   saccharomyces boulardii  250 mg Oral BID   sertraline  50 mg Oral Daily   simvastatin  40 mg Oral QHS   Continuous Infusions:  cefTRIAXone (ROCEPHIN)  IV 2 g (01/10/21 1246)   metronidazole 500 mg (01/10/21 1346)     LOS: 4 days    Time spent: over 30 min    Fayrene Helper, MD [facility]   To contact the attending provider between 7A-7P or the covering provider during after hours 7P-7A, please log into the web site [email or web address] and access using universal [place] password for that web site. If you do not have the password, please call the hospital operator.  01/10/2021, 7:11 PM

## 2021-01-10 NOTE — Progress Notes (Addendum)
Physical Therapy Treatment Patient Details Name: [name] MRN: [id number] DOB: 1955-07-21 Today's Date: 01/10/2021   History of Present Illness 65 y.o. female with history of breast cancer recently started on chemotherapy about a week ago day before that patient had a Port-A-Cath placed on the right side presents to the ER because of fever chills and weakness.  Patient states after her chemotherapy last week she did well for the next 2 days then she has had some constipation and she took over-the-counter medications for constipation following which she started having multiple episodes of loose stools and dysuria started feeling fever chills weakness.  Has nausea no vomiting and has been having some left lower quadrant abdominal discomfort. Dx of SIRS.    PT Comments    Pt tolerated increased ambulation distance of 150' with rollator, no loss of balance. Instructed pt in use of brakes on rollator. She is tolerating increased activity level.  I encouraged pt to ambulate in the halls with assistance at least BID.    Recommendations for follow up therapy are one component of a multi-disciplinary discharge planning process, led by the attending physician.  Recommendations may be updated based on patient status, additional functional criteria and insurance authorization.  Follow Up Recommendations  Home health PT     Equipment Recommendations  Other (comment) (rollator)    Recommendations for Other Services       Precautions / Restrictions Precautions Precautions: Fall Precaution Comments: pt denies falls in past 1 year Restrictions Weight Bearing Restrictions: No     Mobility  Bed Mobility Overal bed mobility: Modified Independent                  Transfers Overall transfer level: Needs assistance Equipment used: 4-wheeled walker Transfers: Sit to/from Stand Sit to Stand: Min guard         General transfer comment: VCs hand  placement  Ambulation/Gait Ambulation/Gait assistance: Min guard Gait Distance (Feet): 150 Feet Assistive device: 4-wheeled walker Gait Pattern/deviations: Step-through pattern;Decreased stride length Gait velocity: decr   General Gait Details: steady with RW, one episode of feeling slightly woozy but this resolved with brief standing rest break   Chief Strategy Officer    Modified Rankin (Stroke Patients Only)       Balance Overall balance assessment: Needs assistance   Sitting balance-Leahy Scale: Good     Standing balance support: During functional activity Standing balance-Leahy Scale: Fair Standing balance comment: relies on BUE support for dynamic standing balance                            Cognition Arousal/Alertness: Awake/alert Behavior During Therapy: WFL for tasks assessed/performed Overall Cognitive Status: Within Functional Limits for tasks assessed                                        Exercises      General Comments        Pertinent Vitals/Pain Pain Score: 4  Pain Location: R chest surgical site Pain Intervention(s): Limited activity within patient's tolerance;Monitored during session    Home Living                      Prior Function            PT  Goals (current goals can now be found in the care plan section) Acute Rehab PT Goals Patient Stated Goal: to be able to walk 2-3 miles again PT Goal Formulation: With patient/family Time For Goal Achievement: 01/21/21 Potential to Achieve Goals: Good Progress towards PT goals: Progressing toward goals    Frequency    Min 3X/week      PT Plan Current plan remains appropriate    Co-evaluation              AM-PAC PT "6 Clicks" Mobility   Outcome Measure  Help needed turning from your back to your side while in a flat bed without using bedrails?: None Help needed moving from lying on your back to sitting on the side  of a flat bed without using bedrails?: A Little Help needed moving to and from a bed to a chair (including a wheelchair)?: A Little Help needed standing up from a chair using your arms (e.g., wheelchair or bedside chair)?: A Little Help needed to walk in hospital room?: A Little Help needed climbing 3-5 steps with a railing? : A Little 6 Click Score: 19    End of Session Equipment Utilized During Treatment: Gait belt Activity Tolerance: Patient tolerated treatment well Patient left: in bed;with call bell/phone within reach Nurse Communication: Mobility status PT Visit Diagnosis: Difficulty in walking, not elsewhere classified (R26.2);Pain;Muscle weakness (generalized) (M62.81)     Time: 0388-8280 PT Time Calculation (min) (ACUTE ONLY): 14 min  Charges:  $Gait Training: 8-22 mins                    Blondell Reveal Kistler PT 01/10/2021  Acute Rehabilitation Services Pager [phone] Office [phone]

## 2021-01-11 DIAGNOSIS — R6510 Systemic inflammatory response syndrome (SIRS) of non-infectious origin without acute organ dysfunction: Secondary | ICD-10-CM | POA: Diagnosis not present

## 2021-01-11 LAB — CULTURE, BLOOD (ROUTINE X 2): Culture: NO GROWTH

## 2021-01-11 LAB — GLUCOSE, CAPILLARY
Glucose-Capillary: 91 mg/dL (ref 70–99)
Glucose-Capillary: 113 mg/dL — ABNORMAL HIGH (ref 70–99)
Glucose-Capillary: 215 mg/dL — ABNORMAL HIGH (ref 70–99)
Glucose-Capillary: 105 mg/dL — ABNORMAL HIGH (ref 70–99)
Glucose-Capillary: 185 mg/dL — ABNORMAL HIGH (ref 70–99)

## 2021-01-11 MED ORDER — KETOROLAC TROMETHAMINE 15 MG/ML IJ SOLN
15.0000 mg | Freq: Four times a day (QID) | INTRAMUSCULAR | Status: DC
  Administered 2021-01-11 – 2021-01-13 (×7): 15 mg via INTRAVENOUS
  Filled 2021-01-11 (×6): qty 1

## 2021-01-11 NOTE — Progress Notes (Signed)
Changed dressing under right arm. Minimal drainage, no odor, patient reported pain. Gauze wet to dry dressing replaced.

## 2021-01-11 NOTE — Progress Notes (Signed)
PROGRESS NOTE    [name]  KZS:[id number] DOB: March 31, 1955 DOA: 01/06/2021 PCP: Antony Contras, MD   Chief Complaint  Patient presents with   cancer patient   Emesis   Dysuria   Fever    Brief Narrative:   65 y.o. female with history of breast cancer recently started on chemotherapy about [name] week ago day before that patient had Safire Gordin [place]-Cath placed on the right side presents to the ER because of fever chills and weakness.  Patient states after her chemotherapy last week she did well for the next 2 days then she has had some constipation and she took over-the-counter medications for constipation following which she started having multiple episodes of loose stools and dysuria started feeling fever chills weakness and at this point decided to come to the ER.  Has nausea no vomiting and has been having some left lower quadrant abdominal discomfort.   Patient states after her lumpectomy in August she did have some fluid collection and also had been treated with antibiotics for possible infection.   ED Course: In the ER patient was febrile with temperature 102 F tachycardic with lab work showing sodium of 128 blood glucose of 302 anion gap of 12 lactic acid of 2.1 CBC unremarkable COVID test negative.  Patient underwent CT scan of the chest and abdomen pelvis.  Showed features concerning for fluid stranding around the [place]-Cath which could be postoperative change or developing infection there is also fluid collection on the right breast area which is also concerning for possible developing infection and also acute sigmoid diverticulitis.  Patient was started on empiric antibiotics fluids admitted for further work-up.  UA is concerning for UTI.     Assessment & Plan:   Principal Problem:   SIRS (systemic inflammatory response syndrome) (HCC) Active Problems:   Malignant neoplasm of upper-inner quadrant of right breast in female, estrogen receptor positive ([place])   Uncontrolled type 2  diabetes mellitus with hyperglycemia ([place])  Sepsis secondary to skin soft tissue infection of right breast and Diverticulitis  Right Breast Abscess  Diverticulitis Fever, tachycardia on presentation in the setting of multiple infectious sources.  Now developed leukocytosis. Concern for infection related to recently placed port (10/10), but based on exam, this seems less likely - general surgery and oncology in agreement Now s/p US guided drainage of 40 ml serosanguinous fluid from right axillary fluid collection S/p  Incision and drainage of right axillary abscess 10/20 per general surgery Culture showing strep agalactiae 10/20 culture with strep agalactiae Abx narrowed to ceftriaxone/flagyl, continue   Blood cx with ng Urine culture showing e. Coli and lactobacillus, unclear significance, abx above should cover Surgery recommends twice daily dressing changes, possible d/c home tmw Will try toradol for pain  Breast Cancer Following outpatient with Dr. Jana Bridges, she's s/p lumpectomy/sentinel LN sampling on 8/16  Day 9 cycle 1 cyclophosphamide and docetaxel - s/p pegfilgastrim 11/13 Has follow up 01/21/2021  T2DM Continue basal (45 units) and bolus (4 units TID with meals) along with SSI A1c 8.7 in 11/2020  HTN Losartan (pt notes for renal protection)  Dyslipidemia statin  DVT prophylaxis: heparin Code Status: full  Family Communication: none at bedside Disposition:   Status is: Inpatient  Remains inpatient appropriate because: continued need for IV abx       Consultants:  Surgery IR oncology  Procedures:  IMPRESSION: Successful US guided of 40 mL of serosanguineous fluid from RIGHT axillary fluid collection.   [name] representative aspirated sample was sent to  the laboratory as requested by the ordering clinical team.  Antimicrobials: Anti-infectives (From admission, onward)    Start     Dose/Rate Route Frequency Ordered Stop   01/08/21 1200  cefTRIAXone  (ROCEPHIN) 2 g in sodium chloride 0.9 % 100 mL IVPB        2 g 200 mL/hr over 30 Minutes Intravenous Every 24 hours 01/08/21 1118     01/07/21 2200  vancomycin (VANCOREADY) IVPB 1250 mg/250 mL  Status:  Discontinued        1,250 mg 166.7 mL/hr over 90 Minutes Intravenous Every 24 hours 01/07/21 0030 01/08/21 1118   01/07/21 0400  ceFEPIme (MAXIPIME) 2 g in sodium chloride 0.9 % 100 mL IVPB  Status:  Discontinued        2 g 200 mL/hr over 30 Minutes Intravenous Every 8 hours 01/07/21 0030 01/08/21 1118   01/07/21 0030  ceFEPIme (MAXIPIME) 2 g in sodium chloride 0.9 % 100 mL IVPB  Status:  Discontinued        2 g 200 mL/hr over 30 Minutes Intravenous  Once 01/07/21 0016 01/07/21 0021   01/07/21 0030  metroNIDAZOLE (FLAGYL) IVPB 500 mg        500 mg 100 mL/hr over 60 Minutes Intravenous Every 12 hours 01/07/21 0016 01/14/21 0029   01/07/21 0030  vancomycin (VANCOCIN) IVPB 1000 mg/200 mL premix  Status:  Discontinued        1,000 mg 200 mL/hr over 60 Minutes Intravenous  Once 01/07/21 0016 01/07/21 0021   01/06/21 1930  vancomycin (VANCOCIN) IVPB 1000 mg/200 mL premix        1,000 mg 200 mL/hr over 60 Minutes Intravenous  Once 01/06/21 1916 01/06/21 2238   01/06/21 1930  ceFEPIme (MAXIPIME) 2 g in sodium chloride 0.9 % 100 mL IVPB        2 g 200 mL/hr over 30 Minutes Intravenous  Once 01/06/21 1921 01/06/21 2054          Subjective: C/o pain related to dressing change  Objective: Vitals:   01/11/21 0353 01/11/21 0545 01/11/21 0833 01/11/21 1453  BP: 138/76  (!) 146/64 (!) 129/55  Pulse: 88  87 85  Resp: 14  18 16   Temp: 98.9 F (37.2 C)  97.7 F (36.5 C) 98.4 F (36.9 C)  TempSrc: Oral  Oral Oral  SpO2: (!) 82%  91% 92%  Weight:  97 kg    Height:        Intake/Output Summary (Last 24 hours) at 01/11/2021 1841 Last data filed at 01/11/2021 1549 Gross per 24 hour  Intake 620 ml  Output --  Net 620 ml   Filed Weights   01/07/21 0026 01/09/21 1317 01/11/21 0545   Weight: 92.3 kg 92.3 kg 97 kg    Examination:  General: No acute distress. Cardiovascular: RRR Lungs: unlabored Abdomen: Soft, nontender, nondistended Neurological: Alert and oriented 3. Moves all extremities 4 . Cranial nerves II through XII grossly intact. Skin: exam deferred today given improvement noted per surgery, will examine tomorrow   Data Reviewed: I have personally reviewed following labs and imaging studies  CBC: Recent Labs  Lab 01/07/21 0039 01/07/21 0418 01/08/21 0531 01/09/21 0514 01/10/21 0530  WBC 10.3 10.1 16.5* 13.3* 11.1*  NEUTROABS  --  5.8 9.8* 8.3* 6.9  HGB 11.8* 10.7* 9.9* 9.7* 9.8*  HCT 36.4 32.4* 30.4* 30.1* 30.4*  MCV 91.5 89.0 91.0 90.9 91.6  PLT 230 209 206 207 712    Basic Metabolic Panel: Recent Labs  Lab 01/06/21 1840 01/07/21 0039 01/07/21 0418 01/08/21 0531 01/09/21 0514 01/10/21 0530  NA 128*  --  131* 134* 136 136  K 3.7  --  3.3* 3.5 3.2* 3.5  CL 94*  --  99 103 100 100  CO2 22  --  27 26 29 29   GLUCOSE 301*  --  227* 181* 131* 115*  BUN 10  --  10 6* 6* <5*  CREATININE 0.61 0.79 0.58 0.52 0.48 0.59  CALCIUM 8.6*  --  8.0* 8.2* 8.1* 8.2*  MG  --   --  1.5* 1.9 1.5* 1.6*  PHOS  --   --   --  2.5 3.2 4.3    GFR: Estimated Creatinine Clearance: 83.9 mL/min (by C-G formula based on SCr of 0.59 mg/dL).  Liver Function Tests: Recent Labs  Lab 01/06/21 1840 01/07/21 0418 01/08/21 0531 01/09/21 0514 01/10/21 0530  AST 29 21  --  18 21  ALT 36 25  --  18 19  ALKPHOS 72 70  --  85 85  BILITOT 0.6 0.6  --  0.4 0.2*  PROT 7.4 6.2*  --  5.8* 6.1*  ALBUMIN 3.8 3.1* 2.9* 2.9* 3.0*    CBG: Recent Labs  Lab 01/10/21 2340 01/11/21 0357 01/11/21 0749 01/11/21 1151 01/11/21 1809  GLUCAP 74 91 113* 215* 105*     Recent Results (from the past 240 hour(s))  Resp Panel by RT-PCR (Flu Gayanne Prescott&B, Covid) Nasopharyngeal Swab     Status: None   Collection Time: 01/06/21  6:12 PM   Specimen: Nasopharyngeal Swab;  Nasopharyngeal(NP) swabs in vial transport medium  Result Value Ref Range Status   SARS Coronavirus 2 by RT PCR NEGATIVE NEGATIVE Final    Comment: (NOTE) SARS-CoV-2 target nucleic acids are NOT DETECTED.  The SARS-CoV-2 RNA is generally detectable in upper respiratory specimens during the acute phase of infection. The lowest concentration of SARS-CoV-2 viral copies this assay can detect is 138 copies/mL. Dorthia Tout negative result does not preclude SARS-Cov-2 infection and should not be used as the sole basis for treatment or other patient management decisions. Iktan Aikman negative result may occur with  improper specimen collection/handling, submission of specimen other than nasopharyngeal swab, presence of viral mutation(s) within the areas targeted by this assay, and inadequate number of viral copies(<138 copies/mL). Zealand Boyett negative result must be combined with clinical observations, patient history, and epidemiological information. The expected result is Negative.  Fact Sheet for Patients:  EntrepreneurPulse.com.au  Fact Sheet for Healthcare Providers:  IncredibleEmployment.be  This test is no t yet approved or cleared by the [place] FDA and  has been authorized for detection and/or diagnosis of SARS-CoV-2 by FDA under an Emergency Use Authorization (EUA). This EUA will remain  in effect (meaning this test can be used) for the duration of the COVID-19 declaration under Section 564(b)(1) of the Act, 21 U.S.C.section 360bbb-3(b)(1), unless the authorization is terminated  or revoked sooner.       Influenza Yuko Coventry by PCR NEGATIVE NEGATIVE Final   Influenza B by PCR NEGATIVE NEGATIVE Final    Comment: (NOTE) The Xpert Xpress SARS-CoV-2/FLU/RSV plus assay is intended as an aid in the diagnosis of influenza from Nasopharyngeal swab specimens and should not be used as Brandis Wixted sole basis for treatment. Nasal washings and aspirates are unacceptable for Xpert Xpress  SARS-CoV-2/FLU/RSV testing.  Fact Sheet for Patients: EntrepreneurPulse.com.au  Fact Sheet for Healthcare Providers: IncredibleEmployment.be  This test is not yet approved or cleared by the [place] and  has been authorized for detection and/or diagnosis of SARS-CoV-2 by FDA under an Emergency Use Authorization (EUA). This EUA will remain in effect (meaning this test can be used) for the duration of the COVID-19 declaration under Section 564(b)(1) of the Act, 21 U.S.C. section 360bbb-3(b)(1), unless the authorization is terminated or revoked.  Performed at [facility], [place] [address], [place], [place] [address]   Blood culture (routine x 2)     Status: None   Collection Time: 01/06/21  6:40 PM   Specimen: BLOOD  Result Value Ref Range Status   Specimen Description   Final    BLOOD RIGHT ANTECUBITAL Performed at [facility] [address] [place], [place] [address]    Special Requests   Final    BOTTLES DRAWN AEROBIC AND ANAEROBIC Blood Culture results may not be optimal due to an inadequate volume of blood received in culture bottles Performed at [place] [address], [place], [place] [address]    Culture   Final    NO GROWTH 5 DAYS Performed at [facility], [place] [address], [place], [place] [address]    Report Status 01/11/2021 FINAL  Final  Urine Culture     Status: Abnormal   Collection Time: 01/06/21  9:45 PM   Specimen: Urine, Clean Catch  Result Value Ref Range Status   Specimen Description   Final    URINE, CLEAN CATCH Performed at [facility], [place] [address] [place], [place] [address]    Special Requests   Final    NONE Performed at [facility], [place] [address] [place], [place] [address]    Culture ([place])  Final    20,000 COLONIES/mL ESCHERICHIA COLI 80,000 COLONIES/mL LACTOBACILLUS SPECIES Standardized  susceptibility testing for this organism is not available. Performed at [facility], [place] [address], [place], [place] [address]    Report Status 01/09/2021 FINAL  Final   Organism ID, Bacteria ESCHERICHIA COLI ([place])  Final      Susceptibility   Escherichia coli - MIC*    AMPICILLIN 8 SENSITIVE Sensitive     CEFAZOLIN <=4 SENSITIVE Sensitive     CEFEPIME <=0.12 SENSITIVE Sensitive     CEFTRIAXONE <=0.25 SENSITIVE Sensitive     CIPROFLOXACIN <=0.25 SENSITIVE Sensitive     GENTAMICIN <=1 SENSITIVE Sensitive     IMIPENEM <=0.25 SENSITIVE Sensitive     NITROFURANTOIN <=16 SENSITIVE Sensitive     TRIMETH/SULFA <=20 SENSITIVE Sensitive     AMPICILLIN/SULBACTAM <=2 SENSITIVE Sensitive     PIP/TAZO <=4 SENSITIVE Sensitive     * 20,000 COLONIES/mL ESCHERICHIA COLI  Blood culture (routine x 2)     Status: None (Preliminary result)   Collection Time: 01/07/21 12:39 AM   Specimen: Right Antecubital; Blood  Result Value Ref Range Status   Specimen Description   Final    RIGHT ANTECUBITAL BLOOD Performed at [facility] [address], [place], [place] [address]    Special Requests   Final    BOTTLES DRAWN AEROBIC AND ANAEROBIC Blood Culture adequate volume Performed at [facility] [address] [place], [place] [address]    Culture   Final    NO GROWTH 4 DAYS Performed at [facility], [place] [address], [place],  [address]    Report Status PENDING  Incomplete  Aerobic/Anaerobic Culture w Gram Stain (surgical/deep wound)     Status: None (Preliminary result)  Collection Time: 01/07/21  1:42 PM   Specimen: Abscess  Result Value Ref Range Status   Specimen Description   Final    ABSCESS RIGHT AXILLA Performed at [facility] [address], [place], [place] [address]    Special Requests   Final    NONE Performed at [facility], [place] [address], [place], [place] [address]    Gram Stain   Final    FEW  WBC PRESENT,BOTH PMN AND MONONUCLEAR MODERATE GRAM POSITIVE COCCI Performed at [facility], [place] [address], [place], [place] [address]    Culture   Final    FEW STREPTOCOCCUS AGALACTIAE TESTING AGAINST S. AGALACTIAE NOT ROUTINELY PERFORMED DUE TO PREDICTABILITY OF AMP/PEN/VAN SUSCEPTIBILITY. NO ANAEROBES ISOLATED; CULTURE IN PROGRESS FOR 5 DAYS    Report Status PENDING  Incomplete  Aerobic/Anaerobic Culture w Gram Stain (surgical/deep wound)     Status: None (Preliminary result)   Collection Time: 01/09/21  2:16 PM   Specimen: PATH Other; Tissue  Result Value Ref Range Status   Specimen Description   Final    ABSCESS RIGHT BREAST Performed at [facility], [place] [address] [place], [place] [address]    Special Requests   Final    NONE Performed at [facility], [place] [address] [place], [place] [address]    Gram Stain   Final    NO SQUAMOUS EPITHELIAL CELLS SEEN FEW WBC SEEN FEW GRAM POSITIVE COCCI    Culture   Final    RARE GROUP B STREP(S.AGALACTIAE)ISOLATED TESTING AGAINST S. AGALACTIAE NOT ROUTINELY PERFORMED DUE TO PREDICTABILITY OF AMP/PEN/VAN SUSCEPTIBILITY. Performed at [facility], [place] [address] [place], [place] [address]    Report Status PENDING  Incomplete         Radiology Studies: No results found.      Scheduled Meds:  amitriptyline  25 mg Oral QHS   Chlorhexidine Gluconate Cloth  6 each Topical Daily   cholecalciferol  5,000 Units Oral Daily   fluticasone  2 spray Each Nare QHS   heparin  5,000 Units Subcutaneous Q8H   insulin aspart  0-9 Units Subcutaneous TID AC & HS   insulin aspart  4 Units Subcutaneous TID WC   insulin glargine-yfgn  45 Units Subcutaneous Daily   ketorolac  15 mg Intravenous Q6H   loratadine  10 mg Oral Daily   losartan  25 mg Oral Daily   multivitamin with minerals  1 tablet Oral Daily   Ensure Max Protein  11 oz Oral Daily   saccharomyces boulardii  250 mg Oral BID    sertraline  50 mg Oral Daily   simvastatin  40 mg Oral QHS   Continuous Infusions:  cefTRIAXone (ROCEPHIN)  IV Stopped (01/11/21 1235)   metronidazole Stopped (01/11/21 1409)     LOS: 5 days    Time spent: over 30 min    Fayrene Helper, MD [facility]   To contact the attending provider between 7A-7P or the covering provider during after hours 7P-7A, please log into the web site [email or web address] and access using universal [place] password for that web site. If you do not have the password, please call the hospital operator.  01/11/2021, 6:41 PM

## 2021-01-11 NOTE — Progress Notes (Signed)
2 Days Post-Op   Subjective/Chief Complaint: Feels well no fevers, drainage out of right axilla   Objective: Vital signs in last 24 hours: Temp:  [98 F (36.7 C)-98.9 F (37.2 C)] 98.9 F (37.2 C) (10/22 0353) Pulse Rate:  [83-91] 88 (10/22 0353) Resp:  [14-16] 14 (10/22 0353) BP: (134-154)/(64-86) 138/76 (10/22 0353) SpO2:  [81 %-93 %] 82 % (10/22 0353) Weight:  [97 kg] 97 kg (10/22 0545) Last BM Date: 01/09/21  Intake/Output from previous day: 10/21 0701 - 10/22 0700 In: 700 [P.O.:600; IV Piggyback:100] Out: -  Intake/Output this shift: No intake/output data recorded.  Right axilla with serous drainage, dressing changed this am, erythema getting better and I think this is not really active infection but related to dependency  Lab Results:  Recent Labs    01/09/21 0514 01/10/21 0530  WBC 13.3* 11.1*  HGB 9.7* 9.8*  HCT 30.1* 30.4*  PLT 207 194   BMET Recent Labs    01/09/21 0514 01/10/21 0530  NA 136 136  K 3.2* 3.5  CL 100 100  CO2 29 29  GLUCOSE 131* 115*  BUN 6* <5*  CREATININE 0.48 0.59  CALCIUM 8.1* 8.2*   PT/INR No results for input(s): LABPROT, INR in the last 72 hours. ABG No results for input(s): PHART, HCO3 in the last 72 hours.  Invalid input(s): PCO2, PO2  Studies/Results: No results found.  Anti-infectives: Anti-infectives (From admission, onward)    Start     Dose/Rate Route Frequency Ordered Stop   01/08/21 1200  cefTRIAXone (ROCEPHIN) 2 g in sodium chloride 0.9 % 100 mL IVPB        2 g 200 mL/hr over 30 Minutes Intravenous Every 24 hours 01/08/21 1118     01/07/21 2200  vancomycin (VANCOREADY) IVPB 1250 mg/250 mL  Status:  Discontinued        1,250 mg 166.7 mL/hr over 90 Minutes Intravenous Every 24 hours 01/07/21 0030 01/08/21 1118   01/07/21 0400  ceFEPIme (MAXIPIME) 2 g in sodium chloride 0.9 % 100 mL IVPB  Status:  Discontinued        2 g 200 mL/hr over 30 Minutes Intravenous Every 8 hours 01/07/21 0030 01/08/21 1118    01/07/21 0030  ceFEPIme (MAXIPIME) 2 g in sodium chloride 0.9 % 100 mL IVPB  Status:  Discontinued        2 g 200 mL/hr over 30 Minutes Intravenous  Once 01/07/21 0016 01/07/21 0021   01/07/21 0030  metroNIDAZOLE (FLAGYL) IVPB 500 mg        500 mg 100 mL/hr over 60 Minutes Intravenous Every 12 hours 01/07/21 0016 01/14/21 0029   01/07/21 0030  vancomycin (VANCOCIN) IVPB 1000 mg/200 mL premix  Status:  Discontinued        1,000 mg 200 mL/hr over 60 Minutes Intravenous  Once 01/07/21 0016 01/07/21 0021   01/06/21 1930  vancomycin (VANCOCIN) IVPB 1000 mg/200 mL premix        1,000 mg 200 mL/hr over 60 Minutes Intravenous  Once 01/06/21 1916 01/06/21 2238   01/06/21 1930  ceFEPIme (MAXIPIME) 2 g in sodium chloride 0.9 % 100 mL IVPB        2 g 200 mL/hr over 30 Minutes Intravenous  Once 01/06/21 1921 01/06/21 2054       Assessment/Plan: Fever on chemotherapy Breast cancer POD 2 right axillary abscess drainage - port placed 12/30/20 does not appear infected - s/p right breast fluid collection aspiration 10/18 >> culture growing strep agalactiae Continue  antibiotics -I think we have source control now, there were no other collections present by [place] or by exam in or.  Will do bid dressing changes, I think another day iv abx and then dc home tomorrow on orals with follow up with me(already in) is appropriate   Sigmoid diverticulitis - Continue antibiotics -this appears resolved   ID - rocephin, flagyl WBC down 11.1 FEN - carb mod VTE - sq heparin Foley - none   HTN HLD DM  Rolm Bookbinder 01/11/2021

## 2021-01-12 ENCOUNTER — Inpatient Hospital Stay (HOSPITAL_COMMUNITY): Payer: Medicare HMO

## 2021-01-12 DIAGNOSIS — R6510 Systemic inflammatory response syndrome (SIRS) of non-infectious origin without acute organ dysfunction: Secondary | ICD-10-CM | POA: Diagnosis not present

## 2021-01-12 LAB — COMPREHENSIVE METABOLIC PANEL
ALT: 15 U/L (ref 0–44)
AST: 18 U/L (ref 15–41)
Albumin: 2.6 g/dL — ABNORMAL LOW (ref 3.5–5.0)
Alkaline Phosphatase: 71 U/L (ref 38–126)
Anion gap: 6 (ref 5–15)
BUN: 7 mg/dL — ABNORMAL LOW (ref 8–23)
CO2: 29 mmol/L (ref 22–32)
Calcium: 8.3 mg/dL — ABNORMAL LOW (ref 8.9–10.3)
Chloride: 102 mmol/L (ref 98–111)
Creatinine, Ser: 0.5 mg/dL (ref 0.44–1.00)
GFR, Estimated: >60 mL/min (ref >60)
Glucose, Bld: 107 mg/dL — ABNORMAL HIGH (ref 70–99)
Potassium: 3.6 mmol/L (ref 3.5–5.1)
Sodium: 137 mmol/L (ref 135–145)
Total Bilirubin: 0.3 mg/dL (ref 0.3–1.2)
Total Protein: 6.1 g/dL — ABNORMAL LOW (ref 6.5–8.1)

## 2021-01-12 LAB — CBC WITH DIFFERENTIAL/PLATELET
Abs Immature Granulocytes: 0.35 10*3/uL — ABNORMAL HIGH (ref 0.00–0.07)
Basophils Absolute: 0.1 10*3/uL (ref 0.0–0.1)
Basophils Relative: 1 %
Eosinophils Absolute: 0 10*3/uL (ref 0.0–0.5)
Eosinophils Relative: 0 %
HCT: 30.2 % — ABNORMAL LOW (ref 36.0–46.0)
Hemoglobin: 9.5 g/dL — ABNORMAL LOW (ref 12.0–15.0)
Immature Granulocytes: 5 %
Lymphocytes Relative: 30 %
Lymphs Abs: 2 10*3/uL (ref 0.7–4.0)
MCH: 29.1 pg (ref 26.0–34.0)
MCHC: 31.5 g/dL (ref 30.0–36.0)
MCV: 92.4 fL (ref 80.0–100.0)
Monocytes Absolute: 0.7 10*3/uL (ref 0.1–1.0)
Monocytes Relative: 10 %
Neutro Abs: 3.5 10*3/uL (ref 1.7–7.7)
Neutrophils Relative %: 54 %
Platelets: 164 10*3/uL (ref 150–400)
RBC: 3.27 MIL/uL — ABNORMAL LOW (ref 3.87–5.11)
RDW: 13.8 % (ref 11.5–15.5)
WBC: 6.6 10*3/uL (ref 4.0–10.5)
nRBC: 0.3 % — ABNORMAL HIGH (ref 0.0–0.2)

## 2021-01-12 LAB — CULTURE, BLOOD (ROUTINE X 2)
Culture: NO GROWTH
Special Requests: ADEQUATE

## 2021-01-12 LAB — AEROBIC/ANAEROBIC CULTURE W GRAM STAIN (SURGICAL/DEEP WOUND)

## 2021-01-12 LAB — GLUCOSE, CAPILLARY
Glucose-Capillary: 109 mg/dL — ABNORMAL HIGH (ref 70–99)
Glucose-Capillary: 294 mg/dL — ABNORMAL HIGH (ref 70–99)
Glucose-Capillary: 173 mg/dL — ABNORMAL HIGH (ref 70–99)
Glucose-Capillary: 152 mg/dL — ABNORMAL HIGH (ref 70–99)

## 2021-01-12 LAB — PHOSPHORUS: Phosphorus: 3.7 mg/dL (ref 2.5–4.6)

## 2021-01-12 LAB — MAGNESIUM: Magnesium: 1.9 mg/dL (ref 1.7–2.4)

## 2021-01-12 MED ORDER — METRONIDAZOLE 500 MG PO TABS
500.0000 mg | ORAL_TABLET | Freq: Two times a day (BID) | ORAL | Status: DC
  Administered 2021-01-12 – 2021-01-13 (×3): 500 mg via ORAL
  Filled 2021-01-12 (×3): qty 1

## 2021-01-12 MED ORDER — FLUCONAZOLE 150 MG PO TABS
150.0000 mg | ORAL_TABLET | Freq: Once | ORAL | Status: AC
  Administered 2021-01-12: 150 mg via ORAL
  Filled 2021-01-12 (×2): qty 1

## 2021-01-12 MED ORDER — POLYETHYLENE GLYCOL 3350 17 G PO PACK
17.0000 g | PACK | Freq: Every day | ORAL | Status: DC
  Filled 2021-01-12 (×2): qty 1

## 2021-01-12 NOTE — Progress Notes (Signed)
PROGRESS NOTE    [name]  TML:[id number] DOB: 14-Jul-1955 DOA: 01/06/2021 PCP: Antony Contras, MD   Chief Complaint  Patient presents with   cancer patient   Emesis   Dysuria   Fever    Brief Narrative:   65 y.o. female with history of breast cancer recently started on chemotherapy about [name] week ago day before that patient had [name] [place] [name] placed on the right side presents to the ER because of fever chills and weakness.  Patient states after her chemotherapy last week she did well for the next 2 days then she has had some constipation and she took over-the-counter medications for constipation following which she started having multiple episodes of loose stools and dysuria started feeling fever chills weakness and at this point decided to come to the ER.  Has nausea no vomiting and has been having some left lower quadrant abdominal discomfort.   Patient states after her lumpectomy in August she did have some fluid collection and also had been treated with antibiotics for possible infection.   ED Course: In the ER patient was febrile with temperature 102 F tachycardic with lab work showing sodium of 128 blood glucose of 302 anion gap of 12 lactic acid of 2.1 CBC unremarkable COVID test negative.  Patient underwent CT scan of the chest and abdomen pelvis.  Showed features concerning for fluid stranding around the [place] Mabe-Cath which could be postoperative change or developing infection there is also fluid collection on the right breast area which is also concerning for possible developing infection and also acute sigmoid diverticulitis.  Patient was started on empiric antibiotics fluids admitted for further work-up.  UA is concerning for UTI.     Assessment & Plan:   Principal Problem:   SIRS (systemic inflammatory response syndrome) (HCC) Active Problems:   Malignant neoplasm of upper-inner quadrant of right breast in female, estrogen receptor positive ([place])   Uncontrolled type 2  diabetes mellitus with hyperglycemia ([place])  Sepsis secondary to skin soft tissue infection of right breast and Diverticulitis  Right Breast Abscess  Diverticulitis Fever, tachycardia on presentation in the setting of multiple infectious sources.  Now developed leukocytosis. Concern for infection related to recently placed port (10/10), but based on exam, this seems less likely - general surgery and oncology in agreement Now s/p US guided drainage of 40 ml serosanguinous fluid from right axillary fluid collection S/p  Incision and drainage of right axillary abscess 10/20 per general surgery CT 10/23 without evidence of undrained component or loculation Culture showing strep agalactiae 10/20 culture with strep agalactiae Abx narrowed to ceftriaxone/flagyl, continue   Blood cx with ng Urine culture showing e. Coli and lactobacillus, unclear significance, abx above should cover Surgery recommends twice daily dressing changes, possible d/c home tmw 10/24 Will try toradol for pain  Breast Cancer Following outpatient with Dr. Jana Bridges, she's s/p lumpectomy/sentinel LN sampling on 8/16  Day 9 cycle 1 cyclophosphamide and docetaxel - s/p pegfilgastrim 11/13 Has follow up 01/21/2021  T2DM Continue basal (45 units) and bolus (4 units TID with meals) along with SSI A1c 8.7 in 11/2020  HTN Losartan (pt notes for renal protection)  Dyslipidemia statin  DVT prophylaxis: heparin Code Status: full  Family Communication: none at bedside Disposition:   Status is: Inpatient  Remains inpatient appropriate because: continued need for IV abx       Consultants:  Surgery IR oncology  Procedures:  IMPRESSION: Successful US guided of 40 mL of serosanguineous fluid from RIGHT axillary fluid  collection.   Hayward Rylander representative aspirated sample was sent to the laboratory as requested by the ordering clinical team.  Antimicrobials: Anti-infectives (From admission, onward)    Start      Dose/Rate Route Frequency Ordered Stop   01/12/21 2115  fluconazole (DIFLUCAN) tablet 150 mg        150 mg Oral  Once 01/12/21 2023     01/12/21 1000  metroNIDAZOLE (FLAGYL) tablet 500 mg        500 mg Oral Every 12 hours 01/12/21 0841 01/14/21 0959   01/08/21 1200  cefTRIAXone (ROCEPHIN) 2 g in sodium chloride 0.9 % 100 mL IVPB        2 g 200 mL/hr over 30 Minutes Intravenous Every 24 hours 01/08/21 1118     01/07/21 2200  vancomycin (VANCOREADY) IVPB 1250 mg/250 mL  Status:  Discontinued        1,250 mg 166.7 mL/hr over 90 Minutes Intravenous Every 24 hours 01/07/21 0030 01/08/21 1118   01/07/21 0400  ceFEPIme (MAXIPIME) 2 g in sodium chloride 0.9 % 100 mL IVPB  Status:  Discontinued        2 g 200 mL/hr over 30 Minutes Intravenous Every 8 hours 01/07/21 0030 01/08/21 1118   01/07/21 0030  ceFEPIme (MAXIPIME) 2 g in sodium chloride 0.9 % 100 mL IVPB  Status:  Discontinued        2 g 200 mL/hr over 30 Minutes Intravenous  Once 01/07/21 0016 01/07/21 0021   01/07/21 0030  metroNIDAZOLE (FLAGYL) IVPB 500 mg  Status:  Discontinued        500 mg 100 mL/hr over 60 Minutes Intravenous Every 12 hours 01/07/21 0016 01/12/21 0841   01/07/21 0030  vancomycin (VANCOCIN) IVPB 1000 mg/200 mL premix  Status:  Discontinued        1,000 mg 200 mL/hr over 60 Minutes Intravenous  Once 01/07/21 0016 01/07/21 0021   01/06/21 1930  vancomycin (VANCOCIN) IVPB 1000 mg/200 mL premix        1,000 mg 200 mL/hr over 60 Minutes Intravenous  Once 01/06/21 1916 01/06/21 2238   01/06/21 1930  ceFEPIme (MAXIPIME) 2 g in sodium chloride 0.9 % 100 mL IVPB        2 g 200 mL/hr over 30 Minutes Intravenous  Once 01/06/21 1921 01/06/21 2054          Subjective: C/o possible yeast infection  Objective: Vitals:   01/11/21 1453 01/11/21 2102 01/12/21 0607 01/12/21 1616  BP: (!) 129/55 (!) 150/76 138/64 (!) 142/73  Pulse: 85 84 81 77  Resp: 16 18 17 16   Temp: 98.4 F (36.9 C) 98.4 F (36.9 C) 98 F (36.7 C)  98.8 F (37.1 C)  TempSrc: Oral Oral Oral Oral  SpO2: 92% 90% 93% 95%  Weight:      Height:        Intake/Output Summary (Last 24 hours) at 01/12/2021 2024 Last data filed at 01/12/2021 1320 Gross per 24 hour  Intake 580 ml  Output --  Net 580 ml   Filed Weights   01/07/21 0026 01/09/21 1317 01/11/21 0545  Weight: 92.3 kg 92.3 kg 97 kg    Examination:  General: No acute distress. Cardiovascular: RRR Lungs:unlabored Abdomen: Soft, nontender, nondistended Neurological: Alert and oriented 3. Moves all extremities 4 . Cranial nerves II through XII grossly intact. Skin: exam done with RN present, R breast with improved redness, less TTP - dressing in place    Data Reviewed: I have personally reviewed following  labs and imaging studies  CBC: Recent Labs  Lab 01/07/21 0418 01/08/21 0531 01/09/21 0514 01/10/21 0530 01/12/21 0500  WBC 10.1 16.5* 13.3* 11.1* 6.6  NEUTROABS 5.8 9.8* 8.3* 6.9 3.5  HGB 10.7* 9.9* 9.7* 9.8* 9.5*  HCT 32.4* 30.4* 30.1* 30.4* 30.2*  MCV 89.0 91.0 90.9 91.6 92.4  PLT 209 206 207 194 578    Basic Metabolic Panel: Recent Labs  Lab 01/07/21 0418 01/08/21 0531 01/09/21 0514 01/10/21 0530 01/12/21 0500  NA 131* 134* 136 136 137  K 3.3* 3.5 3.2* 3.5 3.6  CL 99 103 100 100 102  CO2 27 26 29 29 29   GLUCOSE 227* 181* 131* 115* 107*  BUN 10 6* 6* <5* 7*  CREATININE 0.58 0.52 0.48 0.59 0.50  CALCIUM 8.0* 8.2* 8.1* 8.2* 8.3*  MG 1.5* 1.9 1.5* 1.6* 1.9  PHOS  --  2.5 3.2 4.3 3.7    GFR: Estimated Creatinine Clearance: 83.9 mL/min (by C-G formula based on SCr of 0.5 mg/dL).  Liver Function Tests: Recent Labs  Lab 01/06/21 1840 01/07/21 0418 01/08/21 0531 01/09/21 0514 01/10/21 0530 01/12/21 0500  AST 29 21  --  18 21 18   ALT 36 25  --  18 19 15   ALKPHOS 72 70  --  85 85 71  BILITOT 0.6 0.6  --  0.4 0.2* 0.3  PROT 7.4 6.2*  --  5.8* 6.1* 6.1*  ALBUMIN 3.8 3.1* 2.9* 2.9* 3.0* 2.6*    CBG: Recent Labs  Lab 01/11/21 1809  01/11/21 2104 01/12/21 0809 01/12/21 1211 01/12/21 1710  GLUCAP 105* 185* 109* 294* 173*     Recent Results (from the past 240 hour(s))  Resp Panel by RT-PCR (Flu Lavene Penagos&B, Covid) Nasopharyngeal Swab     Status: None   Collection Time: 01/06/21  6:12 PM   Specimen: Nasopharyngeal Swab; Nasopharyngeal(NP) swabs in vial transport medium  Result Value Ref Range Status   SARS Coronavirus 2 by RT PCR NEGATIVE NEGATIVE Final    Comment: (NOTE) SARS-CoV-2 target nucleic acids are NOT DETECTED.  The SARS-CoV-2 RNA is generally detectable in upper respiratory specimens during the acute phase of infection. The lowest concentration of SARS-CoV-2 viral copies this assay can detect is 138 copies/mL. Toua Stites negative result does not preclude SARS-Cov-2 infection and should not be used as the sole basis for treatment or other patient management decisions. Tomas Schamp negative result may occur with  improper specimen collection/handling, submission of specimen other than nasopharyngeal swab, presence of viral mutation(s) within the areas targeted by this assay, and inadequate number of viral copies(<138 copies/mL). Ferdie Bakken negative result must be combined with clinical observations, patient history, and epidemiological information. The expected result is Negative.  Fact Sheet for Patients:  EntrepreneurPulse.com.au  Fact Sheet for Healthcare Providers:  IncredibleEmployment.be  This test is no t yet approved or cleared by the [place] FDA and  has been authorized for detection and/or diagnosis of SARS-CoV-2 by FDA under an Emergency Use Authorization (EUA). This EUA will remain  in effect (meaning this test can be used) for the duration of the COVID-19 declaration under Section 564(b)(1) of the Act, 21 U.S.C.section 360bbb-3(b)(1), unless the authorization is terminated  or revoked sooner.       Influenza Florine Sprenkle by PCR NEGATIVE NEGATIVE Final   Influenza B by PCR NEGATIVE  NEGATIVE Final    Comment: (NOTE) The Xpert Xpress SARS-CoV-2/FLU/RSV plus assay is intended as an aid in the diagnosis of influenza from Nasopharyngeal swab specimens and should not be used  as Shanel Prazak sole basis for treatment. Nasal washings and aspirates are unacceptable for Xpert Xpress SARS-CoV-2/FLU/RSV testing.  Fact Sheet for Patients: EntrepreneurPulse.com.au  Fact Sheet for Healthcare Providers: IncredibleEmployment.be  This test is not yet approved or cleared by the [place] FDA and has been authorized for detection and/or diagnosis of SARS-CoV-2 by FDA under an Emergency Use Authorization (EUA). This EUA will remain in effect (meaning this test can be used) for the duration of the COVID-19 declaration under Section 564(b)(1) of the Act, 21 U.S.C. section 360bbb-3(b)(1), unless the authorization is terminated or revoked.  Performed at [facility], [place] [address] [place], [place] [address]   Blood culture (routine x 2)     Status: None   Collection Time: 01/06/21  6:40 PM   Specimen: BLOOD  Result Value Ref Range Status   Specimen Description   Final    BLOOD RIGHT ANTECUBITAL Performed at [place] [address], [place], [place] [address]    Special Requests   Final    BOTTLES DRAWN AEROBIC AND ANAEROBIC Blood Culture results may not be optimal due to an inadequate volume of blood received in culture bottles Performed at [place] [address] [place], [place] [address]    Culture   Final    NO GROWTH 5 DAYS Performed at [facility], [place] [address], [place], [place] [address]    Report Status 01/11/2021 FINAL  Final  Urine Culture     Status: Abnormal   Collection Time: 01/06/21  9:45 PM   Specimen: Urine, Clean Catch  Result Value Ref Range Status   Specimen Description   Final    URINE, CLEAN CATCH Performed at [facility],  [place] [address] [place], [place] [address]    Special Requests   Final    NONE Performed at [facility], [place] [address], [place], [place] [address]    Culture (Zaylee Cornia)  Final    20,000 COLONIES/mL ESCHERICHIA COLI 80,000 COLONIES/mL LACTOBACILLUS SPECIES Standardized susceptibility testing for this organism is not available. Performed at [facility], [place] [address], [place], [place] [address]    Report Status 01/09/2021 FINAL  Final   Organism ID, Bacteria ESCHERICHIA COLI (Dex Blakely)  Final      Susceptibility   Escherichia coli - MIC*    AMPICILLIN 8 SENSITIVE Sensitive     CEFAZOLIN <=4 SENSITIVE Sensitive     CEFEPIME <=0.12 SENSITIVE Sensitive     CEFTRIAXONE <=0.25 SENSITIVE Sensitive     CIPROFLOXACIN <=0.25 SENSITIVE Sensitive     GENTAMICIN <=1 SENSITIVE Sensitive     IMIPENEM <=0.25 SENSITIVE Sensitive     NITROFURANTOIN <=16 SENSITIVE Sensitive     TRIMETH/SULFA <=20 SENSITIVE Sensitive     AMPICILLIN/SULBACTAM <=2 SENSITIVE Sensitive     PIP/TAZO <=4 SENSITIVE Sensitive     * 20,000 COLONIES/mL ESCHERICHIA COLI  Blood culture (routine x 2)     Status: None   Collection Time: 01/07/21 12:39 AM   Specimen: Right Antecubital; Blood  Result Value Ref Range Status   Specimen Description   Final    RIGHT ANTECUBITAL BLOOD Performed at [place] [address] [place], [place] [address]    Special Requests   Final    BOTTLES DRAWN AEROBIC AND ANAEROBIC Blood Culture adequate volume Performed at [place] [address], [place], [place] [address]    Culture   Final  NO GROWTH 5 DAYS Performed at [facility], [place] [address] [place], [place] [address]    Report Status 01/12/2021 FINAL  Final  Aerobic/Anaerobic Culture w Gram Stain (surgical/deep wound)     Status: None   Collection Time: 01/07/21  1:42 PM   Specimen: Abscess  Result Value Ref Range Status   Specimen Description   Final    ABSCESS RIGHT  AXILLA Performed at [place] [address] [place], [place] [address]    Special Requests   Final    NONE Performed at [facility], [place] [address] [place], [place] [address]    Gram Stain   Final    FEW WBC PRESENT,BOTH PMN AND MONONUCLEAR MODERATE GRAM POSITIVE COCCI    Culture   Final    FEW STREPTOCOCCUS AGALACTIAE TESTING AGAINST S. AGALACTIAE NOT ROUTINELY PERFORMED DUE TO PREDICTABILITY OF AMP/PEN/VAN SUSCEPTIBILITY. NO ANAEROBES ISOLATED Performed at [facility], [place] [address], [place], [place] [address]    Report Status 01/12/2021 FINAL  Final  Aerobic/Anaerobic Culture w Gram Stain (surgical/deep wound)     Status: None (Preliminary result)   Collection Time: 01/09/21  2:16 PM   Specimen: PATH Other; Tissue  Result Value Ref Range Status   Specimen Description   Final    ABSCESS RIGHT BREAST Performed at [place] [address], [place], [place] [address]    Special Requests   Final    NONE Performed at [facility], [place] [address], [place], [place] [address]    Gram Stain   Final    NO SQUAMOUS EPITHELIAL CELLS SEEN FEW WBC SEEN FEW GRAM POSITIVE COCCI Performed at [facility], [place] [address], [place], [place] [address]    Culture   Final    RARE GROUP B STREP(S.AGALACTIAE)ISOLATED TESTING AGAINST S. AGALACTIAE NOT ROUTINELY PERFORMED DUE TO PREDICTABILITY OF AMP/PEN/VAN SUSCEPTIBILITY. NO ANAEROBES ISOLATED; CULTURE IN PROGRESS FOR 5 DAYS    Report Status PENDING  Incomplete         Radiology Studies: CT CHEST WO CONTRAST  Result Date: 01/12/2021 CLINICAL DATA:  Incision and drainage of axillary fluid collection with continued redness. Rule out undrained fluid. EXAM: CT CHEST WITHOUT CONTRAST TECHNIQUE: Multidetector CT imaging of the chest was performed following the standard protocol without IV contrast. COMPARISON:  01/06/2021 FINDINGS:  Cardiovascular: Heart size is normal. Coronary artery calcification is present. Aortic atherosclerotic calcification is present. Central line in place with the tip in the SVC. Mediastinum/Nodes: No mediastinal or hilar mass or adenopathy. Lungs/Pleura: The lungs are clear.  No pleural effusion. Upper Abdomen: Previous cholecystectomy.  No acute finding. Musculoskeletal: Status post incision and debridement of Douglas Rooks fluid collection in the right axilla/superolateral breast. Air is present centrally within the region. There is no evidence of an undrained component or loculation. IMPRESSION: Status post incision and debridement of Baron Parmelee right axillary/superolateral breast abscess. No evidence of undrained component or loculation. Aortic Atherosclerosis (ICD10-I70.0). coronary artery calcification. Electronically Signed   By: Nelson Chimes M.D.   On: 01/12/2021 14:47        Scheduled Meds:  amitriptyline  25 mg Oral QHS   Chlorhexidine Gluconate Cloth  6 each Topical Daily   cholecalciferol  5,000 Units Oral Daily   fluconazole  150 mg Oral Once   fluticasone  2 spray Each Nare QHS   heparin  5,000 Units Subcutaneous Q8H   insulin aspart  0-9 Units Subcutaneous TID AC &  HS   insulin aspart  4 Units Subcutaneous TID WC   insulin glargine-yfgn  45 Units Subcutaneous Daily   ketorolac  15 mg Intravenous Q6H   loratadine  10 mg Oral Daily   losartan  25 mg Oral Daily   metroNIDAZOLE  500 mg Oral Q12H   multivitamin with minerals  1 tablet Oral Daily   Ensure Max Protein  11 oz Oral Daily   saccharomyces boulardii  250 mg Oral BID   sertraline  50 mg Oral Daily   simvastatin  40 mg Oral QHS   Continuous Infusions:  cefTRIAXone (ROCEPHIN)  IV 2 g (01/12/21 1225)     LOS: 6 days    Time spent: over 30 min    Fayrene Helper, MD [facility]   To contact the attending provider between 7A-7P or the covering provider during after hours 7P-7A, please log into the web site [email or web address] and  access using universal [place] password for that web site. If you do not have the password, please call the hospital operator.  01/12/2021, 8:24 PM

## 2021-01-12 NOTE — Progress Notes (Signed)
3 Days Post-Op   Subjective/Chief Complaint: Feels better still   Objective: Vital signs in last 24 hours: Temp:  [97.7 F (36.5 C)-98.4 F (36.9 C)] 98 F (36.7 C) (10/23 0607) Pulse Rate:  [81-87] 81 (10/23 0607) Resp:  [16-18] 17 (10/23 0607) BP: (129-150)/(55-76) 138/64 (10/23 0607) SpO2:  [90 %-93 %] 93 % (10/23 0607) Last BM Date: 01/09/21  Intake/Output from previous day: 10/22 0701 - 10/23 0700 In: 1220 [P.O.:720; IV Piggyback:500] Out: -  Intake/Output this shift: No intake/output data recorded.  Breasts: right axilla open with granulation tissue, no infection, breast without infection except lateral and onto chest wall there is redness (not sure whether this is still cellulitis or just dependent from prior process), port site clean  Lab Results:  Recent Labs    01/10/21 0530 01/12/21 0500  WBC 11.1* 6.6  HGB 9.8* 9.5*  HCT 30.4* 30.2*  PLT 194 164   BMET Recent Labs    01/10/21 0530 01/12/21 0500  NA 136 137  K 3.5 3.6  CL 100 102  CO2 29 29  GLUCOSE 115* 107*  BUN <5* 7*  CREATININE 0.59 0.50  CALCIUM 8.2* 8.3*   PT/INR No results for input(s): LABPROT, INR in the last 72 hours. ABG No results for input(s): PHART, HCO3 in the last 72 hours.  Invalid input(s): PCO2, PO2  Studies/Results: No results found.  Anti-infectives: Anti-infectives (From admission, onward)    Start     Dose/Rate Route Frequency Ordered Stop   01/08/21 1200  cefTRIAXone (ROCEPHIN) 2 g in sodium chloride 0.9 % 100 mL IVPB        2 g 200 mL/hr over 30 Minutes Intravenous Every 24 hours 01/08/21 1118     01/07/21 2200  vancomycin (VANCOREADY) IVPB 1250 mg/250 mL  Status:  Discontinued        1,250 mg 166.7 mL/hr over 90 Minutes Intravenous Every 24 hours 01/07/21 0030 01/08/21 1118   01/07/21 0400  ceFEPIme (MAXIPIME) 2 g in sodium chloride 0.9 % 100 mL IVPB  Status:  Discontinued        2 g 200 mL/hr over 30 Minutes Intravenous Every 8 hours 01/07/21 0030 01/08/21  1118   01/07/21 0030  ceFEPIme (MAXIPIME) 2 g in sodium chloride 0.9 % 100 mL IVPB  Status:  Discontinued        2 g 200 mL/hr over 30 Minutes Intravenous  Once 01/07/21 0016 01/07/21 0021   01/07/21 0030  metroNIDAZOLE (FLAGYL) IVPB 500 mg        500 mg 100 mL/hr over 60 Minutes Intravenous Every 12 hours 01/07/21 0016 01/14/21 0029   01/07/21 0030  vancomycin (VANCOCIN) IVPB 1000 mg/200 mL premix  Status:  Discontinued        1,000 mg 200 mL/hr over 60 Minutes Intravenous  Once 01/07/21 0016 01/07/21 0021   01/06/21 1930  vancomycin (VANCOCIN) IVPB 1000 mg/200 mL premix        1,000 mg 200 mL/hr over 60 Minutes Intravenous  Once 01/06/21 1916 01/06/21 2238   01/06/21 1930  ceFEPIme (MAXIPIME) 2 g in sodium chloride 0.9 % 100 mL IVPB        2 g 200 mL/hr over 30 Minutes Intravenous  Once 01/06/21 1921 01/06/21 2054       Assessment/Plan: Fever on chemotherapy Breast cancer POD 3 right axillary abscess drainage - port placed 12/30/20 does not appear infected - s/p right breast fluid collection aspiration 10/18 >> culture growing strep agalactiae  -I think  we have source control now, there were no other collections present by [place] or by exam in or.  Will do bid dressing changes, I don't think there is anything that is not drained now but with continued redness will image her today to make sure -she does not appear to have infection now with being afebrile, wbc now normal and feels better -if scan negative I think can go home tomorrow on oral abx   Sigmoid diverticulitis - Continue antibiotics -this appears resolved   ID - rocephin, flagyl WBC normal FEN - carb mod VTE - sq heparin Foley - none   HTN HLD DM  Rolm Bookbinder 01/12/2021

## 2021-01-12 NOTE — Progress Notes (Signed)
The patient is complaining of no BM  since the 20th and asking for something for constipation. Notified [facility] NP and received an order for Miralax oral daily. Will implement order and continue to monitor.

## 2021-01-13 ENCOUNTER — Other Ambulatory Visit: Payer: Self-pay | Admitting: Oncology

## 2021-01-13 DIAGNOSIS — K5909 Other constipation: Secondary | ICD-10-CM

## 2021-01-13 DIAGNOSIS — L042 Acute lymphadenitis of upper limb: Secondary | ICD-10-CM

## 2021-01-13 DIAGNOSIS — N390 Urinary tract infection, site not specified: Secondary | ICD-10-CM

## 2021-01-13 LAB — CBC WITH DIFFERENTIAL/PLATELET
Abs Immature Granulocytes: 0.21 10*3/uL — ABNORMAL HIGH (ref 0.00–0.07)
Basophils Absolute: 0.1 10*3/uL (ref 0.0–0.1)
Basophils Relative: 1 %
Eosinophils Absolute: 0 10*3/uL (ref 0.0–0.5)
Eosinophils Relative: 0 %
HCT: 29.3 % — ABNORMAL LOW (ref 36.0–46.0)
Hemoglobin: 9.4 g/dL — ABNORMAL LOW (ref 12.0–15.0)
Immature Granulocytes: 4 %
Lymphocytes Relative: 32 %
Lymphs Abs: 1.8 10*3/uL (ref 0.7–4.0)
MCH: 29.5 pg (ref 26.0–34.0)
MCHC: 32.1 g/dL (ref 30.0–36.0)
MCV: 91.8 fL (ref 80.0–100.0)
Monocytes Absolute: 0.5 10*3/uL (ref 0.1–1.0)
Monocytes Relative: 8 %
Neutro Abs: 3.1 10*3/uL (ref 1.7–7.7)
Neutrophils Relative %: 55 %
Platelets: 173 10*3/uL (ref 150–400)
RBC: 3.19 MIL/uL — ABNORMAL LOW (ref 3.87–5.11)
RDW: 13.8 % (ref 11.5–15.5)
WBC: 5.7 10*3/uL (ref 4.0–10.5)
nRBC: 0 % (ref 0.0–0.2)

## 2021-01-13 LAB — COMPREHENSIVE METABOLIC PANEL
ALT: 17 U/L (ref 0–44)
AST: 21 U/L (ref 15–41)
Albumin: 2.8 g/dL — ABNORMAL LOW (ref 3.5–5.0)
Alkaline Phosphatase: 64 U/L (ref 38–126)
Anion gap: 3 — ABNORMAL LOW (ref 5–15)
BUN: 10 mg/dL (ref 8–23)
CO2: 31 mmol/L (ref 22–32)
Calcium: 8.2 mg/dL — ABNORMAL LOW (ref 8.9–10.3)
Chloride: 103 mmol/L (ref 98–111)
Creatinine, Ser: 0.56 mg/dL (ref 0.44–1.00)
GFR, Estimated: >60 mL/min (ref >60)
Glucose, Bld: 97 mg/dL (ref 70–99)
Potassium: 3.9 mmol/L (ref 3.5–5.1)
Sodium: 137 mmol/L (ref 135–145)
Total Bilirubin: 0.3 mg/dL (ref 0.3–1.2)
Total Protein: 6 g/dL — ABNORMAL LOW (ref 6.5–8.1)

## 2021-01-13 LAB — GLUCOSE, CAPILLARY
Glucose-Capillary: 108 mg/dL — ABNORMAL HIGH (ref 70–99)
Glucose-Capillary: 195 mg/dL — ABNORMAL HIGH (ref 70–99)

## 2021-01-13 LAB — PHOSPHORUS: Phosphorus: 3.6 mg/dL (ref 2.5–4.6)

## 2021-01-13 LAB — MAGNESIUM: Magnesium: 1.8 mg/dL (ref 1.7–2.4)

## 2021-01-13 MED ORDER — BISACODYL 10 MG RE SUPP
10.0000 mg | Freq: Once | RECTAL | Status: AC
  Administered 2021-01-13: 10 mg via RECTAL
  Filled 2021-01-13: qty 1

## 2021-01-13 MED ORDER — HEPARIN SOD (PORK) LOCK FLUSH 100 UNIT/ML IV SOLN
500.0000 [IU] | Freq: Once | INTRAVENOUS | Status: AC
  Administered 2021-01-13: 500 [IU] via INTRAVENOUS
  Filled 2021-01-13: qty 5

## 2021-01-13 MED ORDER — TRAMADOL HCL 50 MG PO TABS: 50.0000 mg | ORAL_TABLET | Freq: Four times a day (QID) | ORAL | 0 refills | Status: DC | PRN

## 2021-01-13 MED ORDER — AMOXICILLIN 500 MG PO CAPS: 500.0000 mg | ORAL_CAPSULE | Freq: Three times a day (TID) | ORAL | 0 refills | Status: AC

## 2021-01-13 NOTE — Progress Notes (Signed)
4 Days Post-Op   Subjective/Chief Complaint: Continues to feel better.  Still hasn't had a BM   Objective: Vital signs in last 24 hours: Temp:  [97.5 F (36.4 C)-98.8 F (37.1 C)] 97.5 F (36.4 C) (10/24 0427) Pulse Rate:  [74-77] 75 (10/24 0427) Resp:  [16-18] 18 (10/24 0427) BP: (106-142)/(62-73) 106/62 (10/24 0427) SpO2:  [93 %-95 %] 94 % (10/24 0427) Last BM Date: 01/09/21  Intake/Output from previous day: 10/23 0701 - 10/24 0700 In: 480 [P.O.:480] Out: -  Intake/Output this shift: Total I/O In: 120 [P.O.:120] Out: -   Breasts: right axilla open with granulation tissue, no infection, breast without infection except lateral and onto chest wall there is redness (not sure whether this is still cellulitis or just dependent from prior process), port site clean  Lab Results:  Recent Labs    01/12/21 0500 01/13/21 0635  WBC 6.6 5.7  HGB 9.5* 9.4*  HCT 30.2* 29.3*  PLT 164 173   BMET Recent Labs    01/12/21 0500 01/13/21 0635  NA 137 137  K 3.6 3.9  CL 102 103  CO2 29 31  GLUCOSE 107* 97  BUN 7* 10  CREATININE 0.50 0.56  CALCIUM 8.3* 8.2*   PT/INR No results for input(s): LABPROT, INR in the last 72 hours. ABG No results for input(s): PHART, HCO3 in the last 72 hours.  Invalid input(s): PCO2, PO2  Studies/Results: CT CHEST WO CONTRAST  Result Date: 01/12/2021 CLINICAL DATA:  Incision and drainage of axillary fluid collection with continued redness. Rule out undrained fluid. EXAM: CT CHEST WITHOUT CONTRAST TECHNIQUE: Multidetector CT imaging of the chest was performed following the standard protocol without IV contrast. COMPARISON:  01/06/2021 FINDINGS: Cardiovascular: Heart size is normal. Coronary artery calcification is present. Aortic atherosclerotic calcification is present. Central line in place with the tip in the SVC. Mediastinum/Nodes: No mediastinal or hilar mass or adenopathy. Lungs/Pleura: The lungs are clear.  No pleural effusion. Upper Abdomen:  Previous cholecystectomy.  No acute finding. Musculoskeletal: Status post incision and debridement of a fluid collection in the right axilla/superolateral breast. Air is present centrally within the region. There is no evidence of an undrained component or loculation. IMPRESSION: Status post incision and debridement of a right axillary/superolateral breast abscess. No evidence of undrained component or loculation. Aortic Atherosclerosis (ICD10-I70.0). coronary artery calcification. Electronically Signed   By: Nelson Chimes M.D.   On: 01/12/2021 14:47    Anti-infectives: Anti-infectives (From admission, onward)    Start     Dose/Rate Route Frequency Ordered Stop   01/12/21 2130  fluconazole (DIFLUCAN) tablet 150 mg        150 mg Oral  Once 01/12/21 2023 01/12/21 2308   01/12/21 1000  metroNIDAZOLE (FLAGYL) tablet 500 mg        500 mg Oral Every 12 hours 01/12/21 0841 01/14/21 0959   01/08/21 1200  cefTRIAXone (ROCEPHIN) 2 g in sodium chloride 0.9 % 100 mL IVPB        2 g 200 mL/hr over 30 Minutes Intravenous Every 24 hours 01/08/21 1118     01/07/21 2200  vancomycin (VANCOREADY) IVPB 1250 mg/250 mL  Status:  Discontinued        1,250 mg 166.7 mL/hr over 90 Minutes Intravenous Every 24 hours 01/07/21 0030 01/08/21 1118   01/07/21 0400  ceFEPIme (MAXIPIME) 2 g in sodium chloride 0.9 % 100 mL IVPB  Status:  Discontinued        2 g 200 mL/hr over 30 Minutes  Intravenous Every 8 hours 01/07/21 0030 01/08/21 1118   01/07/21 0030  ceFEPIme (MAXIPIME) 2 g in sodium chloride 0.9 % 100 mL IVPB  Status:  Discontinued        2 g 200 mL/hr over 30 Minutes Intravenous  Once 01/07/21 0016 01/07/21 0021   01/07/21 0030  metroNIDAZOLE (FLAGYL) IVPB 500 mg  Status:  Discontinued        500 mg 100 mL/hr over 60 Minutes Intravenous Every 12 hours 01/07/21 0016 01/12/21 0841   01/07/21 0030  vancomycin (VANCOCIN) IVPB 1000 mg/200 mL premix  Status:  Discontinued        1,000 mg 200 mL/hr over 60 Minutes  Intravenous  Once 01/07/21 0016 01/07/21 0021   01/06/21 1930  vancomycin (VANCOCIN) IVPB 1000 mg/200 mL premix        1,000 mg 200 mL/hr over 60 Minutes Intravenous  Once 01/06/21 1916 01/06/21 2238   01/06/21 1930  ceFEPIme (MAXIPIME) 2 g in sodium chloride 0.9 % 100 mL IVPB        2 g 200 mL/hr over 30 Minutes Intravenous  Once 01/06/21 1921 01/06/21 2054       Assessment/Plan: Fever on chemotherapy Breast cancer POD 4 right axillary abscess drainage - port placed 12/30/20 does not appear infected - s/p right breast fluid collection aspiration 10/18 >> culture growing strep agalactiae  -I think we have source control now, there were no other collections present by [place] or by exam in or.  Will do bid dressing changes, I don't think there is anything that is not drained  and this confirmed by CT scan that was completed yesterday -WBC nl and AF -stable for Dc home on oral abx. -she has follow up with Dr. Donne Hazel on Thursday.   Sigmoid diverticulitis - Continue antibiotics -this appears resolved   ID - rocephin, flagyl WBC normal FEN - carb mod VTE - sq heparin Foley - none   HTN HLD DM  Henreitta Cea 01/13/2021

## 2021-01-13 NOTE — Care Management Important Message (Signed)
Important Message  Patient Details IM Letter given to the Patient. Name: [name] MRN: [id number] Date of Birth: 1955/07/31   Medicare Important Message Given:  Yes     Kerin Salen 01/13/2021, 11:44 AM

## 2021-01-13 NOTE — Discharge Summary (Signed)
Physician Discharge Summary  [name] QZR:[id number] DOB: [birth year] DOA: 01/06/2021  PCP: Antony Contras, MD  Admit date: 01/06/2021 Discharge date: 01/13/2021  Time spent: 40 minutes  Recommendations for Outpatient Follow-up:  Follow outpatient CBC/CMP Follow with Dr. Donne Hazel outpatient Follow with Dr. [name] outpatient Complete abx course Follow exam of R breast for resolving skin/soft tissue infection   Discharge Diagnoses:  Principal Problem:   SIRS (systemic inflammatory response syndrome) ([place]) Active Problems:   Malignant neoplasm of upper-inner quadrant of right breast in female, estrogen receptor positive ([place])   Uncontrolled type 2 diabetes mellitus with hyperglycemia [facility])   Discharge Condition: stable  Diet recommendation: heart healthy, diabetic  Filed Weights   01/07/21 0026 01/09/21 1317 01/11/21 0545  Weight: 92.3 kg 92.3 kg 97 kg    History of present illness:  65 y.o. female with history of breast cancer recently started on chemotherapy about Kammie Scioli week ago day before that patient had Gauri Galvao Port-Narcisa Ganesh-Cath placed on the right side presents to the ER because of fever chills and weakness.  Patient states after her chemotherapy last week she did well for the next 2 days then she has had some constipation and she took over-the-counter medications for constipation following which she started having multiple episodes of loose stools and dysuria started feeling fever chills weakness and at this point decided to come to the ER.  Has nausea no vomiting and has been having some left lower quadrant abdominal discomfort.   Patient states after her lumpectomy in August she did have some fluid collection and also had been treated with antibiotics for possible infection.   She was admitted and treated for Tehillah Cipriani right breast/axilla cellulitis and abscess.  She's s/p drainage by IR and I&D by surgery.  Also had diverticulitis.  Discharging on amox.  See below for additional details     Hospital Course:  Sepsis secondary to strep agalactiae abscess and diverticulitis  skin soft tissue infection of right breast and axilla  Right Breast Abscess  Diverticulitis Fever, tachycardia on presentation in the setting of multiple infectious sources.  Now developed leukocytosis. Concern for infection related to recently placed port (10/10), but based on exam, this seems less likely - general surgery and oncology in agreement Now s/p US guided drainage of 40 ml serosanguinous fluid from right axillary fluid collection S/p  Incision and drainage of right axillary abscess 10/20 per general surgery CT 10/23 without evidence of undrained component or loculation Culture showing strep agalactiae 10/20 culture with strep agalactiae Abx narrowed to ceftriaxone/flagyl, discharge on amoxicillin for skin soft tissue infection (she's completed course for diverticulitis) Blood cx with ng Urine culture showing e. Coli and lactobacillus, unclear significance, abx above should cover D/c home in stable condition   Breast Cancer Following outpatient with Dr. [name], she's s/p lumpectomy/sentinel LN sampling on 8/16  Day 9 cycle 1 cyclophosphamide and docetaxel - s/p pegfilgastrim 11/13 Has follow up 01/21/2021   T2DM Continue basal (45 units) and bolus (4 units TID with meals) along with SSI A1c 8.7 in 11/2020   HTN Losartan (pt notes for renal protection)   Dyslipidemia statin    Procedures: IMPRESSION: Successful US guided of 40 mL of serosanguineous fluid from RIGHT axillary fluid collection.   Niemah Schwebke representative aspirated sample was sent to the laboratory as requested by the ordering clinical team.  I&D by surgery of R axillary abscess  Consultations: Surgery IR oncology  Discharge Exam: Vitals:   01/12/21 2127 01/13/21 0427  BP: 130/63 106/62  Pulse: 74 75  Resp: 16 18  Temp: 97.9 F (36.6 C) (!) 97.5 F (36.4 C)  SpO2: 93% 94%   Feels better No  complaints  General: No acute distress. Cardiovascular: Heart sounds show Quenesha Douglass regular rate, and rhythm. Lungs: Clear to auscultation bilaterally  Abdomen: Soft, nontender, nondistended  Neurological: Alert and oriented 3. Moves all extremities 4 . Cranial nerves II through XII grossly intact. Skin: R breast examined with RN at bedside, improved redness and TTP, dressing intact  Discharge Instructions   Discharge Instructions     Call MD for:  difficulty breathing, headache or visual disturbances   Complete by: As directed    Call MD for:  extreme fatigue   Complete by: As directed    Call MD for:  hives   Complete by: As directed    Call MD for:  persistant dizziness or light-headedness   Complete by: As directed    Call MD for:  persistant nausea and vomiting   Complete by: As directed    Call MD for:  redness, tenderness, or signs of infection (pain, swelling, redness, odor or green/yellow discharge around incision site)   Complete by: As directed    Call MD for:  severe uncontrolled pain   Complete by: As directed    Call MD for:  temperature >100.4   Complete by: As directed    Diet - low sodium heart healthy   Complete by: As directed    Diet - low sodium heart healthy   Complete by: As directed    Discharge instructions   Complete by: As directed    You were seen for an abscess and cellulitis of your right breast (infected fluid collection and skin infection).   You've improved with surgical drainage by surgery.  We'll continue you on oral antibiotics for the next 7 days for Ardith Lewman 14 day course (including the antibiotics you received here).  Change your dressings as instructed by surgery.  We also treated you for diverticulitis.  This has improved and your antibiotic course for this is complete.  Please follow up with your PCP outpatient regarding this and follow up.    Return for new, recurrent, or worsening symptoms.  Please ask your PCP to request records from this  hospitalization so they know what was done and what the next steps will be.   Discharge wound care:   Complete by: As directed    Per surgery, twice daily dressing changes   Discharge wound care:   Complete by: As directed    Continue twice daily dressing changes as instructed by general surgery   Increase activity slowly   Complete by: As directed    Increase activity slowly   Complete by: As directed       Allergies as of 01/13/2021       Reactions   Glimepiride    Hypoglycemia         Medication List     TAKE these medications    Accu-Chek Guide Me w/Device Kit   Accu-Chek Guide test strip Generic drug: glucose blood   acetaminophen 500 MG tablet Commonly known as: TYLENOL Take 1,000 mg by mouth every 6 (six) hours as needed for moderate pain.   amitriptyline 25 MG tablet Commonly known as: ELAVIL Take 25 mg by mouth at bedtime.   amoxicillin 500 MG capsule Commonly known as: AMOXIL Take 1 capsule (500 mg total) by mouth 3 (three) times daily for 7 days.   cetirizine 10  MG tablet Commonly known as: ZYRTEC Take 10 mg by mouth at bedtime.   dexamethasone 4 MG tablet Commonly known as: DECADRON Take 2 tablets (8 mg total) by mouth 2 (two) times daily. Start the day before Taxotere. Then again the day after chemo for 3 days. What changed:  when to take this additional instructions   Dialyvite Vitamin D 5000 125 MCG (5000 UT) capsule Generic drug: Cholecalciferol Take 5,000 Units by mouth daily.   ibuprofen 200 MG tablet Commonly known as: ADVIL Take 400 mg by mouth every 6 (six) hours as needed for moderate pain or headache.   INSULIN SYRINGE .5CC/30GX5/16" 30G X 5/16" 0.5 ML Misc See admin instructions.   lidocaine-prilocaine cream Commonly known as: EMLA Apply to affected area once What changed:  how much to take how to take this   LORazepam 0.5 MG tablet Commonly known as: Ativan Take 1 tablet (0.5 mg total) by mouth at bedtime as needed  (Nausea or vomiting).   losartan 25 MG tablet Commonly known as: COZAAR Take 25 mg by mouth daily.   metFORMIN 500 MG tablet Commonly known as: GLUCOPHAGE Take 1,000 mg by mouth 2 (two) times daily.   multivitamin with minerals Tabs tablet Take 1 tablet by mouth daily.   PROBIOTIC PO Take 1 capsule by mouth daily.   prochlorperazine 10 MG tablet Commonly known as: COMPAZINE Take 1 tablet (10 mg total) by mouth every 6 (six) hours as needed (Nausea or vomiting).   sertraline 50 MG tablet Commonly known as: ZOLOFT Take 50 mg by mouth daily.   simvastatin 40 MG tablet Commonly known as: ZOCOR Take 40 mg by mouth at bedtime.   Toujeo SoloStar 300 UNIT/ML Solostar Pen Generic drug: insulin glargine (1 Unit Dial) Inject 45 Units into the skin daily.   traMADol 50 MG tablet Commonly known as: ULTRAM Take 1-2 tablets (50-100 mg total) by mouth every 6 (six) hours as needed for moderate pain.               Durable Medical Equipment  (From admission, onward)           Start     Ordered   01/08/21 1319  For home use only DME 4 wheeled rolling walker with seat  Once       Question:  Patient needs Rockie Vawter walker to treat with the following condition  Answer:  Weakness   01/08/21 1318              Discharge Care Instructions  (From admission, onward)           Start     Ordered   01/13/21 0000  Discharge wound care:       Comments: Per surgery, twice daily dressing changes   01/13/21 1204   01/13/21 0000  Discharge wound care:       Comments: Continue twice daily dressing changes as instructed by general surgery   01/13/21 1204           Allergies  Allergen Reactions   Glimepiride     Hypoglycemia     Follow-up Information     Rolm Bookbinder, MD Follow up on 01/16/2021.   Specialty: General Surgery Why: appointment with Dr Donne Hazel at [facility] at 3 PM on 10/27 Contact information: [address] [place]  [phone]                  The results of significant diagnostics from  this hospitalization (including imaging, microbiology, ancillary and laboratory) are listed below for reference.    Significant Diagnostic Studies: DG Chest 1 View  Result Date: 12/30/2020 CLINICAL DATA:  Chest port insertion EXAM: CHEST  1 VIEW COMPARISON:  None. FINDINGS: Single fluoroscopic image was obtained during the performance of the procedure and is provided for interpretation only. Image demonstrates Anjuli Gemmill right chest wall port via internal jugular approach tip overlying superior vena cava. Please refer to the operative report. FLUOROSCOPY TIME:  19 seconds IMPRESSION: 1. Right chest wall port as above. Electronically Signed   By: Randa Ngo M.D.   On: 12/30/2020 15:32   CT CHEST WO CONTRAST  Result Date: 01/12/2021 CLINICAL DATA:  Incision and drainage of axillary fluid collection with continued redness. Rule out undrained fluid. EXAM: CT CHEST WITHOUT CONTRAST TECHNIQUE: Multidetector CT imaging of the chest was performed following the standard protocol without IV contrast. COMPARISON:  01/06/2021 FINDINGS: Cardiovascular: Heart size is normal. Coronary artery calcification is present. Aortic atherosclerotic calcification is present. Central line in place with the tip in the SVC. Mediastinum/Nodes: No mediastinal or hilar mass or adenopathy. Lungs/Pleura: The lungs are clear.  No pleural effusion. Upper Abdomen: Previous cholecystectomy.  No acute finding. Musculoskeletal: Status post incision and debridement of Joscelin Fray fluid collection in the right axilla/superolateral breast. Air is present centrally within the region. There is no evidence of an undrained component or loculation. IMPRESSION: Status post incision and debridement of Caidin Heidenreich right axillary/superolateral breast abscess. No evidence of undrained component or loculation. Aortic Atherosclerosis (ICD10-I70.0). coronary artery calcification.  Electronically Signed   By: Nelson Chimes M.D.   On: 01/12/2021 14:47   CT Angio Chest PE W and/or Wo Contrast  Result Date: 01/06/2021 CLINICAL DATA:  Shortness of breath. Abdominal infection, fever and pain. EXAM: CT ANGIOGRAPHY CHEST CT ABDOMEN AND PELVIS WITH CONTRAST TECHNIQUE: Multidetector CT imaging of the chest was performed using the standard protocol during bolus administration of intravenous contrast. Multiplanar CT image reconstructions and MIPs were obtained to evaluate the vascular anatomy. Multidetector CT imaging of the abdomen and pelvis was performed using the standard protocol during bolus administration of intravenous contrast. CONTRAST:  41m OMNIPAQUE IOHEXOL 350 MG/ML SOLN COMPARISON:  None. FINDINGS: CTA CHEST FINDINGS Cardiovascular: Satisfactory opacification of the pulmonary arteries to the segmental level. No evidence of pulmonary embolism. The heart is borderline enlarged. There is no pericardial effusion. Aorta is normal in size. There are atherosclerotic calcifications of the aorta. Chest port catheter tip ends in the SVC. Mediastinum/Nodes: No enlarged mediastinal, hilar, or axillary lymph nodes. Thyroid gland, trachea, and esophagus demonstrate no significant findings. Lungs/Pleura: There is mild atelectasis in the bilateral lower lobes. The lungs otherwise appear clear. There is no pleural effusion or pneumothorax. Trachea and central airways are patent. Musculoskeletal: No acute fracture. Right sided chest port is present. There is marked subcutaneous edema in the anterior right chest wall surrounding the port. There is some soft tissue air in the lower right neck, likely related to port placement, although infection is not excluded. There are 2 fluid collections within the right breast with mild surrounding stranding. One is located in the lateral right breast measuring 5.1 by 3.1 by 4.8 cm. The other is located in the superior central breast measuring 4.2 by 1.5 by 2.5 cm.  Review of the MIP images confirms the above findings. CT ABDOMEN and PELVIS FINDINGS Hepatobiliary: The gallbladder surgically absent. No focal liver lesions are identified. There is no biliary ductal dilatation. Pancreas: There is  Mahlon Gabrielle fat attenuation lesion in the neck of the pancreas measuring 10 x 17 mm compatible with lipoma. Pancreas otherwise appears within normal limits. Spleen: Normal in size without focal abnormality. Adrenals/Urinary Tract: Adrenal glands are unremarkable. Kidneys are normal, without renal calculi, focal lesion, or hydronephrosis. Bladder is unremarkable. Stomach/Bowel: There is no evidence for bowel obstruction or free air. The appendix is within normal limits. There is sigmoid colon diverticulosis. There is some mild inflammatory stranding surrounding diverticula in the sigmoid colon compatible with acute diverticulitis. There is no evidence for perforation or abscess. Small bowel and stomach are within normal limits. Vascular/Lymphatic: Aortic atherosclerosis. No enlarged abdominal or pelvic lymph nodes. Reproductive: Status post hysterectomy. No adnexal masses. Other: No abdominal wall hernia or abnormality. No abdominopelvic ascites. Musculoskeletal: Degenerative changes affect the spine. Review of the MIP images confirms the above findings. IMPRESSION: 1. No evidence for pulmonary embolism. 2. Right chest port in place. There is significant subcutaneous stranding surrounding the right chest port with some soft tissue air in the lower right neck. Findings may be related to recent port placement; however, soft tissue infection is not excluded. 3. There are 2 right breast fluid collections, indeterminate. The largest measures up to 5.1 cm. 4. Acute uncomplicated sigmoid colon diverticulitis. 5. Pancreatic lipoma. Electronically Signed   By: Ronney Asters M.D.   On: 01/06/2021 20:58   CT ABDOMEN PELVIS W CONTRAST  Result Date: 01/06/2021 CLINICAL DATA:  Shortness of breath. Abdominal  infection, fever and pain. EXAM: CT ANGIOGRAPHY CHEST CT ABDOMEN AND PELVIS WITH CONTRAST TECHNIQUE: Multidetector CT imaging of the chest was performed using the standard protocol during bolus administration of intravenous contrast. Multiplanar CT image reconstructions and MIPs were obtained to evaluate the vascular anatomy. Multidetector CT imaging of the abdomen and pelvis was performed using the standard protocol during bolus administration of intravenous contrast. CONTRAST:  13m OMNIPAQUE IOHEXOL 350 MG/ML SOLN COMPARISON:  None. FINDINGS: CTA CHEST FINDINGS Cardiovascular: Satisfactory opacification of the pulmonary arteries to the segmental level. No evidence of pulmonary embolism. The heart is borderline enlarged. There is no pericardial effusion. Aorta is normal in size. There are atherosclerotic calcifications of the aorta. Chest port catheter tip ends in the SVC. Mediastinum/Nodes: No enlarged mediastinal, hilar, or axillary lymph nodes. Thyroid gland, trachea, and esophagus demonstrate no significant findings. Lungs/Pleura: There is mild atelectasis in the bilateral lower lobes. The lungs otherwise appear clear. There is no pleural effusion or pneumothorax. Trachea and central airways are patent. Musculoskeletal: No acute fracture. Right sided chest port is present. There is marked subcutaneous edema in the anterior right chest wall surrounding the port. There is some soft tissue air in the lower right neck, likely related to port placement, although infection is not excluded. There are 2 fluid collections within the right breast with mild surrounding stranding. One is located in the lateral right breast measuring 5.1 by 3.1 by 4.8 cm. The other is located in the superior central breast measuring 4.2 by 1.5 by 2.5 cm. Review of the MIP images confirms the above findings. CT ABDOMEN and PELVIS FINDINGS Hepatobiliary: The gallbladder surgically absent. No focal liver lesions are identified. There is no  biliary ductal dilatation. Pancreas: There is Kee Drudge fat attenuation lesion in the neck of the pancreas measuring 10 x 17 mm compatible with lipoma. Pancreas otherwise appears within normal limits. Spleen: Normal in size without focal abnormality. Adrenals/Urinary Tract: Adrenal glands are unremarkable. Kidneys are normal, without renal calculi, focal lesion, or hydronephrosis. Bladder is unremarkable. Stomach/Bowel:  There is no evidence for bowel obstruction or free air. The appendix is within normal limits. There is sigmoid colon diverticulosis. There is some mild inflammatory stranding surrounding diverticula in the sigmoid colon compatible with acute diverticulitis. There is no evidence for perforation or abscess. Small bowel and stomach are within normal limits. Vascular/Lymphatic: Aortic atherosclerosis. No enlarged abdominal or pelvic lymph nodes. Reproductive: Status post hysterectomy. No adnexal masses. Other: No abdominal wall hernia or abnormality. No abdominopelvic ascites. Musculoskeletal: Degenerative changes affect the spine. Review of the MIP images confirms the above findings. IMPRESSION: 1. No evidence for pulmonary embolism. 2. Right chest port in place. There is significant subcutaneous stranding surrounding the right chest port with some soft tissue air in the lower right neck. Findings may be related to recent port placement; however, soft tissue infection is not excluded. 3. There are 2 right breast fluid collections, indeterminate. The largest measures up to 5.1 cm. 4. Acute uncomplicated sigmoid colon diverticulitis. 5. Pancreatic lipoma. Electronically Signed   By: Ronney Asters M.D.   On: 01/06/2021 20:58   DG Chest Port 1 View  Result Date: 01/06/2021 CLINICAL DATA:  Hypoxia and fevers EXAM: PORTABLE CHEST 1 VIEW COMPARISON:  12/30/2020 FINDINGS: Stable right-sided chest wall port is noted with catheter tip in the mid superior vena cava. No pneumothorax is noted. Cardiac shadow is within  normal limits. The lungs are well aerated without focal infiltrate or effusion. No bony abnormality is noted. IMPRESSION: No acute abnormality seen. Electronically Signed   By: Inez Catalina M.D.   On: 01/06/2021 19:26   DG C-Arm 1-60 Min-No Report  Result Date: 12/30/2020 Fluoroscopy was utilized by the requesting physician.  No radiographic interpretation.   US BREAST ASPIRATION RIGHT  Result Date: 01/07/2021 INDICATION: Breast cancer history status post recent RIGHT lumpectomy and axillary biopsy. Febrile, infectious workup. EXAM: US-GUIDED RIGHT AXILLARY COLLECTION ASPIRATION COMPARISON:  CT chest, 01/06/2021.  OR fluoroscopy, 12/30/2020. MEDICATIONS: The patient is currently admitted to the hospital and receiving intravenous antibiotics. The antibiotics were administered within an appropriate time frame prior to the initiation of the procedure. ANESTHESIA/SEDATION: Local anesthetic was administered. CONTRAST:  None COMPLICATIONS: None immediate. PROCEDURE: Informed written consent was obtained from the patient after Ineta Sinning discussion of the risks, benefits and alternatives to treatment. Preprocedural ultrasound scanning demonstrated well circumscribed, round RIGHT axillary anechoic collection measuring approximately 5.2 x 4.5 cm. Rogen Porte timeout was performed prior to the initiation of the procedure. The RIGHT axilla was prepped and draped in the usual sterile fashion. The overlying soft tissues were anesthetized with 1% lidocaine with epinephrine. Under direct ultrasound guidance, Desira Alessandrini 18 gauge trocar needle was advanced into the abscess/fluid collection. Multiple ultrasound images were saved for procedural documentation purposes. Next, approximately 40 mL of serosanguineous fluid was aspirated from the collection. Lynzee Lindquist representative sample of aspirated fluid was capped and sent to the laboratory for analysis. The needle was removed and superficial hemostasis was achieved with manual compression. Christhoper Busbee dressing was  placed. The patient tolerated the procedure well without immediate postprocedural complication. IMPRESSION: Successful US guided of 40 mL of serosanguineous fluid from RIGHT axillary fluid collection. Shanese Riemenschneider representative aspirated sample was sent to the laboratory as requested by the ordering clinical team. Michaelle Birks, MD [facility] Electronically Signed   By: Michaelle Birks M.D.   On: 01/07/2021 13:56    Microbiology: Recent Results (from the past 240 hour(s))  Resp Panel by RT-PCR (Flu Lelar Farewell&B, Covid) Nasopharyngeal Swab  Status: None   Collection Time: 01/06/21  6:12 PM   Specimen: Nasopharyngeal Swab; Nasopharyngeal(NP) swabs in vial transport medium  Result Value Ref Range Status   SARS Coronavirus 2 by RT PCR NEGATIVE NEGATIVE Final    Comment: (NOTE) SARS-CoV-2 target nucleic acids are NOT DETECTED.  The SARS-CoV-2 RNA is generally detectable in upper respiratory specimens during the acute phase of infection. The lowest concentration of SARS-CoV-2 viral copies this assay can detect is 138 copies/mL. Zamzam Whinery negative result does not preclude SARS-Cov-2 infection and should not be used as the sole basis for treatment or other patient management decisions. Anniyah Mood negative result may occur with  improper specimen collection/handling, submission of specimen other than nasopharyngeal swab, presence of viral mutation(s) within the areas targeted by this assay, and inadequate number of viral copies(<138 copies/mL). Teodoro Jeffreys negative result must be combined with clinical observations, patient history, and epidemiological information. The expected result is Negative.  Fact Sheet for Patients:  EntrepreneurPulse.com.au  Fact Sheet for Healthcare Providers:  IncredibleEmployment.be  This test is no t yet approved or cleared by the [place] FDA and  has been authorized for detection and/or diagnosis of SARS-CoV-2  by FDA under an Emergency Use Authorization (EUA). This EUA will remain  in effect (meaning this test can be used) for the duration of the COVID-19 declaration under Section 564(b)(1) of the Act, 21 U.S.C.section 360bbb-3(b)(1), unless the authorization is terminated  or revoked sooner.       Influenza Adnan Vanvoorhis by PCR NEGATIVE NEGATIVE Final   Influenza B by PCR NEGATIVE NEGATIVE Final    Comment: (NOTE) The Xpert Xpress SARS-CoV-2/FLU/RSV plus assay is intended as an aid in the diagnosis of influenza from Nasopharyngeal swab specimens and should not be used as Alasha Mcguinness sole basis for treatment. Nasal washings and aspirates are unacceptable for Xpert Xpress SARS-CoV-2/FLU/RSV testing.  Fact Sheet for Patients: EntrepreneurPulse.com.au  Fact Sheet for Healthcare Providers: IncredibleEmployment.be  This test is not yet approved or cleared by the [place] FDA and has been authorized for detection and/or diagnosis of SARS-CoV-2 by FDA under an Emergency Use Authorization (EUA). This EUA will remain in effect (meaning this test can be used) for the duration of the COVID-19 declaration under Section 564(b)(1) of the Act, 21 U.S.C. section 360bbb-3(b)(1), unless the authorization is terminated or revoked.  Performed at [facility], [place] [address] [place], [place] [address]   Blood culture (routine x 2)     Status: None   Collection Time: 01/06/21  6:40 PM   Specimen: BLOOD  Result Value Ref Range Status   Specimen Description   Final    BLOOD RIGHT ANTECUBITAL Performed at [place] [address], [place], [place] [address]    Special Requests   Final    BOTTLES DRAWN AEROBIC AND ANAEROBIC Blood Culture results may not be optimal due to an inadequate volume of blood received in culture bottles Performed at [facility] [address] [place], [place] [address]    Culture   Final    NO  GROWTH 5 DAYS Performed at [facility], [place] [address], [place], [place] [address]    Report Status 01/11/2021 FINAL  Final  Urine Culture     Status: Abnormal   Collection Time: 01/06/21  9:45 PM   Specimen: Urine, Clean Catch  Result Value Ref Range Status   Specimen Description   Final    URINE, CLEAN CATCH Performed at [facility],  [place] [address], [place], [place] [address]    Special Requests   Final    NONE Performed at [facility], [place] [address], [place], [place] [address]    Culture (Royce Stegman)  Final    20,000 COLONIES/mL ESCHERICHIA COLI 80,000 COLONIES/mL LACTOBACILLUS SPECIES Standardized susceptibility testing for this organism is not available. Performed at [facility], [place] [address] [place], [place] [address]    Report Status 01/09/2021 FINAL  Final   Organism ID, Bacteria ESCHERICHIA COLI (Talayla Doyel)  Final      Susceptibility   Escherichia coli - MIC*    AMPICILLIN 8 SENSITIVE Sensitive     CEFAZOLIN <=4 SENSITIVE Sensitive     CEFEPIME <=0.12 SENSITIVE Sensitive     CEFTRIAXONE <=0.25 SENSITIVE Sensitive     CIPROFLOXACIN <=0.25 SENSITIVE Sensitive     GENTAMICIN <=1 SENSITIVE Sensitive     IMIPENEM <=0.25 SENSITIVE Sensitive     NITROFURANTOIN <=16 SENSITIVE Sensitive     TRIMETH/SULFA <=20 SENSITIVE Sensitive     AMPICILLIN/SULBACTAM <=2 SENSITIVE Sensitive     PIP/TAZO <=4 SENSITIVE Sensitive     * 20,000 COLONIES/mL ESCHERICHIA COLI  Blood culture (routine x 2)     Status: None   Collection Time: 01/07/21 12:39 AM   Specimen: Right Antecubital; Blood  Result Value Ref Range Status   Specimen Description   Final    RIGHT ANTECUBITAL BLOOD Performed at [facility] [place] [address], [place], [place] [address]    Special Requests   Final    BOTTLES DRAWN AEROBIC AND ANAEROBIC Blood Culture adequate volume Performed at [place] [address], [place], [place] [address]    Culture    Final    NO GROWTH 5 DAYS Performed at [facility], [place] [address], [place], [place] [address]    Report Status 01/12/2021 FINAL  Final  Aerobic/Anaerobic Culture w Gram Stain (surgical/deep wound)     Status: None   Collection Time: 01/07/21  1:42 PM   Specimen: Abscess  Result Value Ref Range Status   Specimen Description   Final    ABSCESS RIGHT AXILLA Performed at [place] [address] [place], [place] [address]    Special Requests   Final    NONE Performed at [facility], [place] [address] [place], [place] [address]    Gram Stain   Final    FEW WBC PRESENT,BOTH PMN AND MONONUCLEAR MODERATE GRAM POSITIVE COCCI    Culture   Final    FEW STREPTOCOCCUS AGALACTIAE TESTING AGAINST S. AGALACTIAE NOT ROUTINELY PERFORMED DUE TO PREDICTABILITY OF AMP/PEN/VAN SUSCEPTIBILITY. NO ANAEROBES ISOLATED Performed at [facility], [place] [address] [place], [place] [address]    Report Status 01/12/2021 FINAL  Final  Aerobic/Anaerobic Culture w Gram Stain (surgical/deep wound)     Status: None (Preliminary result)   Collection Time: 01/09/21  2:16 PM   Specimen: PATH Other; Tissue  Result Value Ref Range Status   Specimen Description   Final    ABSCESS RIGHT BREAST Performed at [place] [address], [place], [place] [address]    Special Requests   Final    NONE Performed at [facility], [place] [address] [place], [place] [address]    Gram Stain   Final    NO SQUAMOUS EPITHELIAL CELLS SEEN FEW WBC SEEN FEW GRAM POSITIVE COCCI Performed at [facility], [place]  [address], [place], [place] [address]    Culture   Final    RARE GROUP B STREP(S.AGALACTIAE)ISOLATED TESTING AGAINST S. AGALACTIAE NOT ROUTINELY PERFORMED DUE TO PREDICTABILITY OF AMP/PEN/VAN SUSCEPTIBILITY. NO ANAEROBES ISOLATED; CULTURE IN PROGRESS FOR 5 DAYS    Report Status PENDING  Incomplete     Labs: Basic  Metabolic Panel: Recent Labs  Lab 01/08/21 0531 01/09/21 0514 01/10/21 0530 01/12/21 0500 01/13/21 0635  NA 134* 136 136 137 137  K 3.5 3.2* 3.5 3.6 3.9  CL 103 100 100 102 103  CO2 _0 GLUCOSE 181* 131* 115* 107* 97  BUN 6* 6* <5* 7* 10  CREATININE 0.52 0.48 0.59 0.50 0.56  CALCIUM 8.2* 8.1* 8.2* 8.3* 8.2*  MG 1.9 1.5* 1.6* 1.9 1.8  PHOS 2.5 3.2 4.3 3.7 3.6   Liver Function Tests: Recent Labs  Lab 01/07/21 0418 01/08/21 0531 01/09/21 0514 01/10/21 0530 01/12/21 0500 01/13/21 0635  AST 21  --  _1 ALT 25  --  _2 ALKPHOS 70  --  85 85 71 64  BILITOT 0.6  --  0.4 0.2* 0.3 0.3  PROT 6.2*  --  5.8* 6.1* 6.1* 6.0*  ALBUMIN 3.1* 2.9* 2.9* 3.0* 2.6* 2.8*   Recent Labs  Lab 01/06/21 1840  LIPASE 20   No results for input(s): AMMONIA in the last 168 hours. CBC: Recent Labs  Lab 01/08/21 0531 01/09/21 0514 01/10/21 0530 01/12/21 0500 01/13/21 0635  WBC 16.5* 13.3* 11.1* 6.6 5.7  NEUTROABS 9.8* 8.3* 6.9 3.5 3.1  HGB 9.9* 9.7* 9.8* 9.5* 9.4*  HCT 30.4* 30.1* 30.4* 30.2* 29.3*  MCV 91.0 90.9 91.6 92.4 91.8  PLT 206 207 194 164 173   Cardiac Enzymes: No results for input(s): CKTOTAL, CKMB, CKMBINDEX, TROPONINI in the last 168 hours. BNP: BNP (last 3 results) No results for input(s): BNP in the last 8760 hours.  ProBNP (last 3 results) No results for input(s): PROBNP in the last 8760 hours.  CBG: Recent Labs  Lab 01/12/21 0809 01/12/21 1211 01/12/21 1710 01/12/21 2129 01/13/21 0749  GLUCAP 109* 294* 173* 152* 108*       Signed:  Fayrene Helper MD.  [facility] 01/13/2021, 12:05 PM

## 2021-01-13 NOTE — Progress Notes (Signed)
[name]   DOB:04-23-1955   DJ#:[id number]   HYI#:[id number]  Subjective:  [name] tells me she felt immediately better after her drainage procedure last Thursday 10/20. Her right axillary wound was evaluated by Dr Donne Hazel yesterday and he obtained a CT which is also reassuring. She feels ready for discharge today. No BM >3 days, unable to tolerate Miralax yesterday  Objective: White woman examined in bed Vitals:   01/12/21 2127 01/13/21 0427  BP: 130/63 106/62  Pulse: 74 75  Resp: 16 18  Temp: 97.9 F (36.6 C) (!) 97.5 F (36.4 C)  SpO2: 93% 94%    Body mass index is 33.49 kg/m.  Intake/Output Summary (Last 24 hours) at 01/13/2021 0716 Last data filed at 01/12/2021 1320 Gross per 24 hour  Intake 480 ml  Output --  Net 480 ml    Has not started losing her hair yet  Sclerae unicteric  Lungs no rales or wheezes--auscultated anterolaterally  Heart regular rate and rhythm  Abdomen soft, few BS  Neuro nonfocal  Skin: R axilla bandaged over  CBG (last 3)  Recent Labs    01/12/21 1211 01/12/21 1710 01/12/21 2129  GLUCAP 294* 173* 152*     Labs:  Lab Results  Component Value Date   WBC 5.7 01/13/2021   HGB 9.4 (L) 01/13/2021   HCT 29.3 (L) 01/13/2021   MCV 91.8 01/13/2021   PLT 173 01/13/2021   NEUTROABS 3.1 01/13/2021    _0 @  Urine Studies No results for input(s): UHGB, CRYS in the last 72 hours.  Invalid input(s): UACOL, UAPR, USPG, UPH, UTP, UGL, UKET, UBIL, UNIT, UROB, [place], UEPI, UWBC, [place], [place], [place], [place], [place]  Basic Metabolic Panel: Recent Labs  Lab 01/08/21 0531 01/09/21 0514 01/10/21 0530 01/12/21 0500 01/13/21 0635  NA 134* 136 136 137 137  K 3.5 3.2* 3.5 3.6 3.9  CL 103 100 100 102 103  CO2 _1 GLUCOSE 181* 131* 115* 107* 97  BUN 6* 6* <5* 7* 10  CREATININE 0.52 0.48 0.59 0.50 0.56  CALCIUM 8.2* 8.1* 8.2* 8.3* 8.2*  MG 1.9 1.5* 1.6* 1.9 1.8  PHOS 2.5 3.2 4.3 3.7 3.6   GFR Estimated Creatinine Clearance:  83.9 mL/min (by C-G formula based on SCr of 0.56 mg/dL). Liver Function Tests: Recent Labs  Lab 01/07/21 0418 01/08/21 0531 01/09/21 0514 01/10/21 0530 01/12/21 0500 01/13/21 0635  AST 21  --  _2 ALT 25  --  _3 ALKPHOS 70  --  85 85 71 64  BILITOT 0.6  --  0.4 0.2* 0.3 0.3  PROT 6.2*  --  5.8* 6.1* 6.1* 6.0*  ALBUMIN 3.1* 2.9* 2.9* 3.0* 2.6* 2.8*   Recent Labs  Lab 01/06/21 1840  LIPASE 20   No results for input(s): AMMONIA in the last 168 hours. Coagulation profile No results for input(s): INR, PROTIME in the last 168 hours.  CBC: Recent Labs  Lab 01/08/21 0531 01/09/21 0514 01/10/21 0530 01/12/21 0500 01/13/21 0635  WBC 16.5* 13.3* 11.1* 6.6 5.7  NEUTROABS 9.8* 8.3* 6.9 3.5 3.1  HGB 9.9* 9.7* 9.8* 9.5* 9.4*  HCT 30.4* 30.1* 30.4* 30.2* 29.3*  MCV 91.0 90.9 91.6 92.4 91.8  PLT 206 207 194 164 173   Cardiac Enzymes: No results for input(s): CKTOTAL, CKMB, CKMBINDEX, TROPONINI in the last 168 hours. BNP: Invalid input(s): POCBNP CBG: Recent Labs  Lab 01/11/21 2104 01/12/21 0809 01/12/21 1211 01/12/21 1710 01/12/21 2129  GLUCAP 185* 109* 294* 173* 152*   D-Dimer No results for input(s): DDIMER in the last 72 hours. Hgb A1c No results for input(s): HGBA1C in the last 72 hours. Lipid Profile No results for input(s): CHOL, HDL, LDLCALC, TRIG, CHOLHDL, LDLDIRECT in the last 72 hours. Thyroid function studies No results for input(s): TSH, T4TOTAL, T3FREE, THYROIDAB in the last 72 hours.  Invalid input(s): FREET3 Anemia work up No results for input(s): VITAMINB12, FOLATE, FERRITIN, TIBC, IRON, RETICCTPCT in the last 72 hours. Microbiology Recent Results (from the past 240 hour(s))  Resp Panel by RT-PCR (Flu A&B, Covid) Nasopharyngeal Swab     Status: None   Collection Time: 01/06/21  6:12 PM   Specimen: Nasopharyngeal Swab; Nasopharyngeal(NP) swabs in vial transport medium  Result Value Ref Range Status   SARS Coronavirus 2 by RT  PCR NEGATIVE NEGATIVE Final    Comment: (NOTE) SARS-CoV-2 target nucleic acids are NOT DETECTED.  The SARS-CoV-2 RNA is generally detectable in upper respiratory specimens during the acute phase of infection. The lowest concentration of SARS-CoV-2 viral copies this assay can detect is 138 copies/mL. A negative result does not preclude SARS-Cov-2 infection and should not be used as the sole basis for treatment or other patient management decisions. A negative result may occur with  improper specimen collection/handling, submission of specimen other than nasopharyngeal swab, presence of viral mutation(s) within the areas targeted by this assay, and inadequate number of viral copies(<138 copies/mL). A negative result must be combined with clinical observations, patient history, and epidemiological information. The expected result is Negative.  Fact Sheet for Patients:  EntrepreneurPulse.com.au  Fact Sheet for Healthcare Providers:  IncredibleEmployment.be  This test is no t yet approved or cleared by the [place] FDA and  has been authorized for detection and/or diagnosis of SARS-CoV-2 by FDA under an Emergency Use Authorization (EUA). This EUA will remain  in effect (meaning this test can be used) for the duration of the COVID-19 declaration under Section 564(b)(1) of the Act, 21 U.S.C.section 360bbb-3(b)(1), unless the authorization is terminated  or revoked sooner.       Influenza A by PCR NEGATIVE NEGATIVE Final   Influenza B by PCR NEGATIVE NEGATIVE Final    Comment: (NOTE) The Xpert Xpress SARS-CoV-2/FLU/RSV plus assay is intended as an aid in the diagnosis of influenza from Nasopharyngeal swab specimens and should not be used as a sole basis for treatment. Nasal washings and aspirates are unacceptable for Xpert Xpress SARS-CoV-2/FLU/RSV testing.  Fact Sheet for Patients: EntrepreneurPulse.com.au  Fact Sheet for  Healthcare Providers: IncredibleEmployment.be  This test is not yet approved or cleared by the [place] FDA and has been authorized for detection and/or diagnosis of SARS-CoV-2 by FDA under an Emergency Use Authorization (EUA). This EUA will remain in effect (meaning this test can be used) for the duration of the COVID-19 declaration under Section 564(b)(1) of the Act, 21 U.S.C. section 360bbb-3(b)(1), unless the authorization is terminated or revoked.  Performed at [facility], [place] [address], [place], [place] [address]   Blood culture (routine x 2)     Status: None   Collection Time: 01/06/21  6:40 PM   Specimen: BLOOD  Result Value Ref Range Status   Specimen Description   Final    BLOOD RIGHT ANTECUBITAL Performed at [facility] [address] [place], [place] [address]    Special Requests   Final    BOTTLES DRAWN AEROBIC AND ANAEROBIC Blood Culture results may not be optimal  due to an inadequate volume of blood received in culture bottles Performed at [facility] [address] [place], [place] [address]    Culture   Final    NO GROWTH 5 DAYS Performed at [facility], [place] [address], [place], [place] [address]    Report Status 01/11/2021 FINAL  Final  Urine Culture     Status: Abnormal   Collection Time: 01/06/21  9:45 PM   Specimen: Urine, Clean Catch  Result Value Ref Range Status   Specimen Description   Final    URINE, CLEAN CATCH Performed at [facility], [place] [address] [place], [place] [address]    Special Requests   Final    NONE Performed at [facility], [place] [address], [place], [place] [address]    Culture (A)  Final    20,000 COLONIES/mL ESCHERICHIA COLI 80,000 COLONIES/mL LACTOBACILLUS SPECIES Standardized susceptibility testing for this organism is not available. Performed at [facility], [place] [address],  [place], [place] [address]    Report Status 01/09/2021 FINAL  Final   Organism ID, Bacteria ESCHERICHIA COLI (A)  Final      Susceptibility   Escherichia coli - MIC*    AMPICILLIN 8 SENSITIVE Sensitive     CEFAZOLIN <=4 SENSITIVE Sensitive     CEFEPIME <=0.12 SENSITIVE Sensitive     CEFTRIAXONE <=0.25 SENSITIVE Sensitive     CIPROFLOXACIN <=0.25 SENSITIVE Sensitive     GENTAMICIN <=1 SENSITIVE Sensitive     IMIPENEM <=0.25 SENSITIVE Sensitive     NITROFURANTOIN <=16 SENSITIVE Sensitive     TRIMETH/SULFA <=20 SENSITIVE Sensitive     AMPICILLIN/SULBACTAM <=2 SENSITIVE Sensitive     PIP/TAZO <=4 SENSITIVE Sensitive     * 20,000 COLONIES/mL ESCHERICHIA COLI  Blood culture (routine x 2)     Status: None   Collection Time: 01/07/21 12:39 AM   Specimen: Right Antecubital; Blood  Result Value Ref Range Status   Specimen Description   Final    RIGHT ANTECUBITAL BLOOD Performed at [facility] [address] [place], [place] [address]    Special Requests   Final    BOTTLES DRAWN AEROBIC AND ANAEROBIC Blood Culture adequate volume Performed at [place] [address] [place], [place] [address]    Culture   Final    NO GROWTH 5 DAYS Performed at [facility], [place] [address] [place], [place] [address]    Report Status 01/12/2021 FINAL  Final  Aerobic/Anaerobic Culture w Gram Stain (surgical/deep wound)     Status: None   Collection Time: 01/07/21  1:42 PM   Specimen: Abscess  Result Value Ref Range Status   Specimen Description   Final    ABSCESS RIGHT AXILLA Performed at [place] [address], [place], [place] [address]    Special Requests   Final    NONE Performed at [facility], [place] [address], [place], [place] [address]    Gram Stain   Final    FEW WBC PRESENT,BOTH PMN AND MONONUCLEAR MODERATE GRAM POSITIVE COCCI    Culture   Final    FEW STREPTOCOCCUS AGALACTIAE TESTING AGAINST S. AGALACTIAE  NOT ROUTINELY PERFORMED DUE TO PREDICTABILITY OF AMP/PEN/VAN SUSCEPTIBILITY. NO ANAEROBES ISOLATED Performed at [facility], [place] [address] [place], [place] [address]    Report Status 01/12/2021 FINAL  Final  Aerobic/Anaerobic Culture w Gram Stain (surgical/deep wound)  Status: None (Preliminary result)   Collection Time: 01/09/21  2:16 PM   Specimen: PATH Other; Tissue  Result Value Ref Range Status   Specimen Description   Final    ABSCESS RIGHT BREAST Performed at [place] [address], [place], [place] [address]    Special Requests   Final    NONE Performed at [facility], [place] [address] [place], [place] [address]    Gram Stain   Final    NO SQUAMOUS EPITHELIAL CELLS SEEN FEW WBC SEEN FEW GRAM POSITIVE COCCI Performed at [facility], [place] [address] [place], [place] [address]    Culture   Final    RARE GROUP B STREP(S.AGALACTIAE)ISOLATED TESTING AGAINST S. AGALACTIAE NOT ROUTINELY PERFORMED DUE TO PREDICTABILITY OF AMP/PEN/VAN SUSCEPTIBILITY. NO ANAEROBES ISOLATED; CULTURE IN PROGRESS FOR 5 DAYS    Report Status PENDING  Incomplete      Studies:  CT CHEST WO CONTRAST  Result Date: 01/12/2021 CLINICAL DATA:  Incision and drainage of axillary fluid collection with continued redness. Rule out undrained fluid. EXAM: CT CHEST WITHOUT CONTRAST TECHNIQUE: Multidetector CT imaging of the chest was performed following the standard protocol without IV contrast. COMPARISON:  01/06/2021 FINDINGS: Cardiovascular: Heart size is normal. Coronary artery calcification is present. Aortic atherosclerotic calcification is present. Central line in place with the tip in the SVC. Mediastinum/Nodes: No mediastinal or hilar mass or adenopathy. Lungs/Pleura: The lungs are clear.  No pleural effusion. Upper Abdomen: Previous cholecystectomy.  No acute finding. Musculoskeletal: Status post incision and debridement of a fluid collection in  the right axilla/superolateral breast. Air is present centrally within the region. There is no evidence of an undrained component or loculation. IMPRESSION: Status post incision and debridement of a right axillary/superolateral breast abscess. No evidence of undrained component or loculation. Aortic Atherosclerosis (ICD10-I70.0). coronary artery calcification. Electronically Signed   By: Nelson Chimes M.D.   On: 01/12/2021 14:47    Assessment: 65 y.o.[place] woman status post right breast upper inner quadrant biopsy 10/04/2020 for clinical T1b N0, stage IA invasive lobular carcinoma, E-cadherin negative, grade 1, estrogen receptor positive, progesterone receptor and HER2 negative, with an MIB-1 of 10%; admitted 01/06/2021 with fever, nausea, vomiting, dysuria and abdominal discomfort  (A) CT angio chest shows no pulmonary embolus, stranding associated with port, 2 right breast fluid collections   (B) CT abdomen and pelvis shows sigmoid diverticulitis  (1) status post right lumpectomy and sentinel lymph node sampling 11/05/2020 for a PT1b pN0, stage IA invasive lobular carcinoma, grade 2   (2) Oncotype score of 32 predicts a risk of recurrence outside the breast within the next 9 years of 20% if the patient's only systemic therapy is antiestrogens for 5 years.  It also predicts significant benefit from chemotherapy.   (3) adjuvant chemotherapy with cyclophosphamide and docetaxel every 21 days x 4 started 12/31/2020   (4) adjuvant radiation to follow   (5) antiestrogens to start at the completion of local treatment.    Plan:  Dymphna is now day 14 cycle 1 of cyclophosphamide and docetaxel.  She received PEG fill gastrim on 11/13.  She was never neutropenic, had an appropriate rise in her counts from the peg Filgrastim, and her WBC have normalized. She currently has no symptoms suggestive of diverticulitis. Her right axillary fluid grew S agalactiae, urine grew e coli (20K col.cc), pan-sensitive;  blood cultures remain negative.  She requested something for he constipation. Dulcolax written  I am comfortable with  discharge plan as per yesterday's notes. She has an appointment with [place] 01/21/2021 for her next chemotherapy dose. Please let me know if we can be of further help       [name], MD 01/13/2021  7:16 AM Medical Oncology and Hematology [facility] [address] [place], [place] [address] Tel. [phone]    Fax. [phone]

## 2021-01-14 ENCOUNTER — Encounter: Payer: Self-pay | Admitting: Oncology

## 2021-01-14 NOTE — Telephone Encounter (Signed)
No entry 

## 2021-01-15 LAB — AEROBIC/ANAEROBIC CULTURE W GRAM STAIN (SURGICAL/DEEP WOUND): Gram Stain: NONE SEEN

## 2021-01-20 MED FILL — Dexamethasone Sodium Phosphate Inj 100 MG/10ML: INTRAMUSCULAR | Qty: 1 | Status: AC

## 2021-01-21 ENCOUNTER — Encounter: Payer: Self-pay | Admitting: Adult Health

## 2021-01-21 ENCOUNTER — Other Ambulatory Visit: Payer: Self-pay

## 2021-01-21 ENCOUNTER — Inpatient Hospital Stay: Payer: Medicare HMO | Attending: Oncology

## 2021-01-21 ENCOUNTER — Inpatient Hospital Stay: Payer: Medicare HMO | Admitting: Adult Health

## 2021-01-21 ENCOUNTER — Inpatient Hospital Stay: Payer: Medicare HMO

## 2021-01-21 VITALS — BP 166/83 | HR 119 | Temp 97.9°F | Resp 18 | Ht 67.0 in | Wt 204.8 lb

## 2021-01-21 VITALS — BP 151/73 | HR 99 | Resp 16

## 2021-01-21 DIAGNOSIS — C50211 Malignant neoplasm of upper-inner quadrant of right female breast: Secondary | ICD-10-CM

## 2021-01-21 DIAGNOSIS — Z170 Estrogen receptor positive status [ER+]: Secondary | ICD-10-CM | POA: Diagnosis not present

## 2021-01-21 DIAGNOSIS — Z5189 Encounter for other specified aftercare: Secondary | ICD-10-CM | POA: Diagnosis not present

## 2021-01-21 DIAGNOSIS — Z5111 Encounter for antineoplastic chemotherapy: Secondary | ICD-10-CM | POA: Diagnosis not present

## 2021-01-21 DIAGNOSIS — Z95828 Presence of other vascular implants and grafts: Secondary | ICD-10-CM

## 2021-01-21 LAB — CBC WITH DIFFERENTIAL/PLATELET
Abs Immature Granulocytes: 0.03 10*3/uL (ref 0.00–0.07)
Basophils Absolute: 0 10*3/uL (ref 0.0–0.1)
Basophils Relative: 0 %
Eosinophils Absolute: 0 10*3/uL (ref 0.0–0.5)
Eosinophils Relative: 0 %
HCT: 35.5 % — ABNORMAL LOW (ref 36.0–46.0)
Hemoglobin: 11.4 g/dL — ABNORMAL LOW (ref 12.0–15.0)
Immature Granulocytes: 0 %
Lymphocytes Relative: 16 %
Lymphs Abs: 1.6 10*3/uL (ref 0.7–4.0)
MCH: 29.1 pg (ref 26.0–34.0)
MCHC: 32.1 g/dL (ref 30.0–36.0)
MCV: 90.6 fL (ref 80.0–100.0)
Monocytes Absolute: 0.4 10*3/uL (ref 0.1–1.0)
Monocytes Relative: 4 %
Neutro Abs: 7.9 10*3/uL — ABNORMAL HIGH (ref 1.7–7.7)
Neutrophils Relative %: 80 %
Platelets: 554 10*3/uL — ABNORMAL HIGH (ref 150–400)
RBC: 3.92 MIL/uL (ref 3.87–5.11)
RDW: 13.8 % (ref 11.5–15.5)
WBC: 9.9 10*3/uL (ref 4.0–10.5)
nRBC: 0 % (ref 0.0–0.2)

## 2021-01-21 LAB — COMPREHENSIVE METABOLIC PANEL
ALT: 34 U/L (ref 0–44)
AST: 22 U/L (ref 15–41)
Albumin: 3.8 g/dL (ref 3.5–5.0)
Alkaline Phosphatase: 69 U/L (ref 38–126)
Anion gap: 14 (ref 5–15)
BUN: 14 mg/dL (ref 8–23)
CO2: 22 mmol/L (ref 22–32)
Calcium: 9.7 mg/dL (ref 8.9–10.3)
Chloride: 99 mmol/L (ref 98–111)
Creatinine, Ser: 0.85 mg/dL (ref 0.44–1.00)
GFR, Estimated: >60 mL/min (ref >60)
Glucose, Bld: 288 mg/dL — ABNORMAL HIGH (ref 70–99)
Potassium: 4.9 mmol/L (ref 3.5–5.1)
Sodium: 135 mmol/L (ref 135–145)
Total Bilirubin: 0.4 mg/dL (ref 0.3–1.2)
Total Protein: 7.9 g/dL (ref 6.5–8.1)

## 2021-01-21 MED ORDER — PALONOSETRON HCL INJECTION 0.25 MG/5ML
0.2500 mg | Freq: Once | INTRAVENOUS | Status: AC
  Administered 2021-01-21: 0.25 mg via INTRAVENOUS
  Filled 2021-01-21: qty 5

## 2021-01-21 MED ORDER — SODIUM CHLORIDE 0.9 % IV SOLN
75.0000 mg/m2 | Freq: Once | INTRAVENOUS | Status: AC
  Administered 2021-01-21: 160 mg via INTRAVENOUS
  Filled 2021-01-21: qty 16

## 2021-01-21 MED ORDER — SODIUM CHLORIDE 0.9% FLUSH
10.0000 mL | INTRAVENOUS | Status: AC | PRN
  Administered 2021-01-21: 10 mL

## 2021-01-21 MED ORDER — SODIUM CHLORIDE 0.9 % IV SOLN: 4.0000 mg | Freq: Once | INTRAVENOUS | Status: DC

## 2021-01-21 MED ORDER — DEXAMETHASONE SODIUM PHOSPHATE 10 MG/ML IJ SOLN
4.0000 mg | Freq: Once | INTRAMUSCULAR | Status: AC
  Administered 2021-01-21: 4 mg via INTRAVENOUS
  Filled 2021-01-21: qty 1

## 2021-01-21 MED ORDER — SODIUM CHLORIDE 0.9 % IV SOLN: Freq: Once | INTRAVENOUS | Status: AC

## 2021-01-21 MED ORDER — SODIUM CHLORIDE 0.9% FLUSH
10.0000 mL | INTRAVENOUS | Status: DC | PRN
  Administered 2021-01-21: 10 mL

## 2021-01-21 MED ORDER — HEPARIN SOD (PORK) LOCK FLUSH 100 UNIT/ML IV SOLN
500.0000 [IU] | Freq: Once | INTRAVENOUS | Status: AC | PRN
Start: 2021-01-21 — End: 2021-01-21
  Administered 2021-01-21: 500 [IU]

## 2021-01-21 MED ORDER — FLUCONAZOLE 150 MG PO TABS: 150.0000 mg | ORAL_TABLET | Freq: Every day | ORAL | 0 refills | Status: DC

## 2021-01-21 MED ORDER — SODIUM CHLORIDE 0.9 % IV SOLN
600.0000 mg/m2 | Freq: Once | INTRAVENOUS | Status: AC
  Administered 2021-01-21: 1240 mg via INTRAVENOUS
  Filled 2021-01-21: qty 62

## 2021-01-21 MED ORDER — DEXAMETHASONE 4 MG PO TABS: 4.0000 mg | ORAL_TABLET | Freq: Every day | ORAL | 0 refills | Status: DC

## 2021-01-21 NOTE — Progress Notes (Addendum)
[place]  Telephone:([phone]) [phone] [phone]) [phone]     ID: [name] DOB: 1955/05/22  MR#: [id number]  JKD#:[id number]  Patient Care Team: Antony Contras, MD as PCP - General (Family Medicine) Mauro Kaufmann, RN as Oncology Nurse Navigator Rockwell Germany, RN as Oncology Nurse Navigator Rolm Bookbinder, MD as Consulting Physician (General Surgery) Magrinat, Virgie Dad, MD as Consulting Physician (Oncology) Eppie Gibson, MD as Attending Physician (Radiation Oncology) Arvella Nigh, MD as Consulting Physician (Obstetrics and Gynecology) Jacelyn Pi, MD as Referring Physician (Endocrinology) Scot Dock, NP OTHER MD:  CHIEF COMPLAINT: Estrogen receptor positive breast cancer  CURRENT TREATMENT: Adjuvant chemotherapy   INTERVAL HISTORY: [name] returns today for follow up and treatment of her estrogen receptor positive breast cancer. She is accompanied by her husband.   She began adjuvant docetaxel and cyclophosphamide 12/31/2020. This will be given every 21 days x4. Today is cycle 2 day 1 of treatment.  She receives Udenyca for WBC support on day 3 of treatment.  [name] was hospitalized from 10/17-10/24 for right breast/axilla cellulitis and abscess and diverticulitis.  She was treated with antibiotics with amoxicillin.  She notes she is having a continued yeast infection that she would like some more Diflucan to be treated with.  She says that she has been changing her right breast incision and drainage dressing daily without any issues.  She notes that it is healing well and much improved.  [name] is also concerned about her blood sugar today she took the steroids yesterday and her blood sugar this morning fasting was 260 today in the office it is 288.  She notes that last week prior to her discharge her blood sugar was under much better control.  [name] has resumed her normal activities.  She enjoyed Halloween festivities with her grandchildren yesterday.   She does have some expected mild fatigue.  She is using the cryotherapy for her hands and feet during chemoinfusion to prevent peripheral neuropathy.  She denies no progression of her slight baseline neuropathy from her diabetes.  She is having no motor deficits.    REVIEW OF SYSTEMS: Review of Systems  Constitutional:  Positive for fatigue. Negative for appetite change, chills, fever and unexpected weight change.  HENT:   Negative for hearing loss, lump/mass and trouble swallowing.   Eyes:  Negative for eye problems and icterus.  Respiratory:  Negative for chest tightness, cough and shortness of breath.   Cardiovascular:  Negative for chest pain, leg swelling and palpitations.  Gastrointestinal:  Negative for abdominal distention, abdominal pain, constipation, diarrhea, nausea and vomiting.  Endocrine: Negative for hot flashes.  Genitourinary:  Negative for difficulty urinating.   Musculoskeletal:  Negative for arthralgias.  Skin:  Negative for itching and rash.  Neurological:  Negative for dizziness, extremity weakness, headaches and numbness.  Hematological:  Negative for adenopathy. Does not bruise/bleed easily.  Psychiatric/Behavioral:  Negative for depression. The patient is not nervous/anxious.      COVID 19 VACCINATION STATUS: Pfizer x3 as of July 2022   HISTORY OF CURRENT ILLNESS: From the original intake note:  [name] had routine screening mammography on 09/04/2020 showing a possible abnormality in the right breast. She underwent right diagnostic mammography with tomography and right breast ultrasonography at [facility] on 09/30/2020 showing: breast density category B; 0.7 cm mass in right breast at 12:30; normal right axillary lymph nodes.  Accordingly on 10/04/2020 she proceeded to biopsy of the right breast area in question. The pathology from  this procedure ([id number]) showed: invasive mammary carcinoma, e-cadherin negative, grade 1; atypical lobular hyperplasia.  Prognostic indicators significant for: estrogen receptor, 90% positive with moderate staining intensity and progesterone receptor, 0% negative. Proliferation marker Ki67 at 10%. HER2 negative by immunohistochemistry (1+).  Cancer Staging Malignant neoplasm of upper-inner quadrant of right breast in female, estrogen receptor positive ([place]) Staging form: Breast, AJCC 8th Edition - Clinical stage from 10/16/2020: Stage IA (cT1b, cN0, cM0, G1, ER+, PR-, HER2-) - Signed by [name], MD on 10/16/2020 Stage prefix: Initial diagnosis Histologic grading system: 3 grade system Laterality: Right Staged by: Pathologist and managing physician Stage used in treatment planning: Yes National guidelines used in treatment planning: Yes Type of national guideline used in treatment planning: NCCN  The patient's subsequent history is as detailed below.   PAST MEDICAL HISTORY: Past Medical History:  Diagnosis Date   Cancer ([place])    Cataract    removed   Diabetes mellitus without complication ([place])    History of kidney stones    Hyperlipidemia    Hypertension    PONV (postoperative nausea and vomiting)     PAST SURGICAL HISTORY: Past Surgical History:  Procedure Laterality Date   ABDOMINAL HYSTERECTOMY     BREAST LUMPECTOMY Right 01/09/2021   Procedure: incision drainage right breast abscess;  Surgeon: Rolm Bookbinder, MD;  Location: [facility];  Service: General;  Laterality: Right;   BREAST LUMPECTOMY WITH RADIOACTIVE SEED AND SENTINEL LYMPH NODE BIOPSY Right 11/05/2020   Procedure: RIGHT BREAST LUMPECTOMY WITH RADIOACTIVE SEED AND RIGHT AXILLARY SENTINEL LYMPH NODE BIOPSY;  Surgeon: Rolm Bookbinder, MD;  Location: [facility];  Service: General;  Laterality: Right;   CHOLECYSTECTOMY     KNEE SURGERY     PORTACATH PLACEMENT Right 12/30/2020   Procedure: INSERTION PORT-A-CATH;  Surgeon: Rolm Bookbinder, MD;  Location: [facility];  Service: General;  Laterality: Right;     FAMILY HISTORY: Family History  Problem Relation Age of Onset   Heart attack Mother    Diabetes Father    Colon cancer Maternal Grandmother    Breast cancer Cousin        dx early 22's   Her father died at age 42 from diabetes complications. Her mother died at age 25 from MI. Tanicia has two brothers (and no sisters). She reports breast cancer in a maternal cousin in her early 74's and colon cancer in a maternal grandmother.   GYNECOLOGIC HISTORY:  No LMP recorded. Patient has had a hysterectomy. Menarche: 65 years old Age at first live birth: 65 years old [place] P 3 LMP 02/2000 Contraceptive: used from 29 HRT never used  Hysterectomy? Yes, 02/2000 BSO? 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 Husband [name] is a retired Radiographer, therapeutic. Son [name], age 23, is a Geophysicist/field seismologist and youth pastor in [place]. Daughter [name], age 46, is a high school principal in [place]. Daughter [name], age 45, is a hair stylist here in [place]. Lauriann has two grandchildren. She attends a [employer].    ADVANCED DIRECTIVES: In the absence of any documentation to the contrary, the patient's spouse is their HCPOA.    HEALTH MAINTENANCE: Social History   Tobacco Use   Smoking status: Former    Packs/day: 0.50    Years: 10.00    Pack years: 5.00    Types: Cigarettes  Quit date: 09/25/1981    Years since quitting: 39.3   Smokeless tobacco: Never  Vaping Use   Vaping Use: Never used  Substance Use Topics   Alcohol use: Never   Drug use: Never     Colonoscopy: 2018 (Dr. Earlean Shawl)  PAP: 08/2020  Bone density: 08/2020   Allergies  Allergen Reactions   Glimepiride     Hypoglycemia     Current Outpatient Medications  Medication Sig Dispense Refill   ACCU-CHEK GUIDE test strip      acetaminophen (TYLENOL) 500 MG tablet Take 1,000 mg by mouth  every 6 (six) hours as needed for moderate pain.     amitriptyline (ELAVIL) 25 MG tablet Take 25 mg by mouth at bedtime.     Blood Glucose Monitoring Suppl (ACCU-CHEK GUIDE ME) w/Device KIT      cetirizine (ZYRTEC) 10 MG tablet Take 10 mg by mouth at bedtime.     Cholecalciferol (DIALYVITE VITAMIN D 5000) 125 MCG (5000 UT) capsule Take 5,000 Units by mouth daily.     dexamethasone (DECADRON) 4 MG tablet Take 2 tablets (8 mg total) by mouth 2 (two) times daily. Start the day before Taxotere. Then again the day after chemo for 3 days. (Patient taking differently: Take 8 mg by mouth See admin instructions. Take 2 tablets by mouth on Mon wednesdays and fridays per patient) 30 tablet 1   ibuprofen (ADVIL) 200 MG tablet Take 400 mg by mouth every 6 (six) hours as needed for moderate pain or headache.     Insulin Syringe-Needle U-100 (INSULIN SYRINGE .5CC/30GX5/16") 30G X 5/16" 0.5 ML MISC See admin instructions.     lidocaine-prilocaine (EMLA) cream Apply to affected area once (Patient taking differently: Apply 1 application topically. Apply to affected area once) 30 g 3   LORazepam (ATIVAN) 0.5 MG tablet Take 1 tablet (0.5 mg total) by mouth at bedtime as needed (Nausea or vomiting). 30 tablet 0   losartan (COZAAR) 25 MG tablet Take 25 mg by mouth daily.     metFORMIN (GLUCOPHAGE) 500 MG tablet Take 1,000 mg by mouth 2 (two) times daily.     Multiple Vitamin (MULTIVITAMIN WITH MINERALS) TABS tablet Take 1 tablet by mouth daily.     Probiotic Product (PROBIOTIC PO) Take 1 capsule by mouth daily.     prochlorperazine (COMPAZINE) 10 MG tablet Take 1 tablet (10 mg total) by mouth every 6 (six) hours as needed (Nausea or vomiting). 30 tablet 1   sertraline (ZOLOFT) 50 MG tablet Take 50 mg by mouth daily.     simvastatin (ZOCOR) 40 MG tablet Take 40 mg by mouth at bedtime.     TOUJEO SOLOSTAR 300 UNIT/ML Solostar Pen Inject 45 Units into the skin daily.     traMADol (ULTRAM) 50 MG tablet Take 1-2 tablets  (50-100 mg total) by mouth every 6 (six) hours as needed for moderate pain. 30 tablet 0   No current facility-administered medications for this visit.    OBJECTIVE:   Vitals:   01/21/21 0828  BP: (!) 166/83  Pulse: (!) 119  Resp: 18  Temp: 97.9 F (36.6 C)  SpO2: 95%      Body mass index is 32.08 kg/m.   Wt Readings from Last 3 Encounters:  01/21/21 204 lb 12.8 oz (92.9 kg)  01/11/21 213 lb 13.5 oz (97 kg)  12/31/20 209 lb 9.6 oz (95.1 kg)     ECOG FS:1 - Symptomatic but completely ambulatory GENERAL: Patient is a well appearing female in  no acute distress HEENT:  Sclerae anicteric.  Oropharynx clear and moist. No ulcerations or evidence of oropharyngeal candidiasis. Neck is supple.  NODES:  No cervical, supraclavicular, or axillary lymphadenopathy palpated.  BREAST EXAM: Left breast is benign right breast is status post lumpectomy and right axillary lymph node biopsy.  She does have a incision and drainage scar that is healing well.  There is no sign of infection. LUNGS:  Clear to auscultation bilaterally.  No wheezes or rhonchi. HEART:  Regular rate and rhythm.  Heart rate is 100 to auscultation.No murmur appreciated. ABDOMEN:  Soft, nontender.  Positive, normoactive bowel sounds. No organomegaly palpated. MSK:  No focal spinal tenderness to palpation. Full range of motion bilaterally in the upper extremities. EXTREMITIES:  No peripheral edema.   SKIN:  Clear with no obvious rashes or skin changes. No nail dyscrasia. NEURO:  Nonfocal. Well oriented.  Appropriate affect.    LAB RESULTS:  CMP     Component Value Date/Time   NA 135 01/21/2021 0754   K 4.9 01/21/2021 0754   CL 99 01/21/2021 0754   CO2 22 01/21/2021 0754   GLUCOSE 288 (H) 01/21/2021 0754   BUN 14 01/21/2021 0754   CREATININE 0.85 01/21/2021 0754   CREATININE 0.76 12/31/2020 0750   CALCIUM 9.7 01/21/2021 0754   PROT 7.9 01/21/2021 0754   ALBUMIN 3.8 01/21/2021 0754   AST 22 01/21/2021 0754   AST 24  12/31/2020 0750   ALT 34 01/21/2021 0754   ALT 25 12/31/2020 0750   ALKPHOS 69 01/21/2021 0754   BILITOT 0.4 01/21/2021 0754   BILITOT 0.3 12/31/2020 0750   GFRNONAA >60 01/21/2021 0754   GFRNONAA >60 12/31/2020 0750    No results found for: TOTALPROTELP, ALBUMINELP, A1GS, A2GS, BETS, BETA2SER, GAMS, MSPIKE, SPEI  Lab Results  Component Value Date   WBC 9.9 01/21/2021   NEUTROABS 7.9 (H) 01/21/2021   HGB 11.4 (L) 01/21/2021   HCT 35.5 (L) 01/21/2021   MCV 90.6 01/21/2021   PLT 554 (H) 01/21/2021    No results found for: LABCA2  No components found for: [id number]  No results for input(s): INR in the last 168 hours.  No results found for: LABCA2  No results found for: HGD924  No results found for: QAS341  No results found for: DQQ229  No results found for: CA2729  No components found for: HGQUANT  No results found for: CEA1 / No results found for: CEA1   No results found for: AFPTUMOR  No results found for: CHROMOGRNA  No results found for: KPAFRELGTCHN, LAMBDASER, KAPLAMBRATIO (kappa/lambda light chains)  No results found for: HGBA, HGBA2QUANT, HGBFQUANT, HGBSQUAN (Hemoglobinopathy evaluation)   No results found for: LDH  No results found for: IRON, TIBC, IRONPCTSAT (Iron and TIBC)  No results found for: FERRITIN  Urinalysis    Component Value Date/Time   COLORURINE AMBER (A) 01/06/2021 2145   APPEARANCEUR CLEAR 01/06/2021 2145   LABSPEC >1.046 (H) 01/06/2021 2145   PHURINE 5.0 01/06/2021 2145   GLUCOSEU 150 (A) 01/06/2021 2145   HGBUR NEGATIVE 01/06/2021 2145   [place] NEGATIVE 01/06/2021 2145   [place] NEGATIVE 01/06/2021 2145   PROTEINUR 30 (A) 01/06/2021 2145   NITRITE POSITIVE (A) 01/06/2021 2145   LEUKOCYTESUR TRACE (A) 01/06/2021 2145     STUDIES: DG Chest 1 View  Result Date: 12/30/2020 CLINICAL DATA:  Chest port insertion EXAM: CHEST  1 VIEW COMPARISON:  None. FINDINGS: Single fluoroscopic image was obtained during the  performance of the procedure and  is provided for interpretation only. Image demonstrates a right chest wall port via internal jugular approach tip overlying superior vena cava. Please refer to the operative report. FLUOROSCOPY TIME:  19 seconds IMPRESSION: 1. Right chest wall port as above. Electronically Signed   By: Randa Ngo M.D.   On: 12/30/2020 15:32   CT CHEST WO CONTRAST  Result Date: 01/12/2021 CLINICAL DATA:  Incision and drainage of axillary fluid collection with continued redness. Rule out undrained fluid. EXAM: CT CHEST WITHOUT CONTRAST TECHNIQUE: Multidetector CT imaging of the chest was performed following the standard protocol without IV contrast. COMPARISON:  01/06/2021 FINDINGS: Cardiovascular: Heart size is normal. Coronary artery calcification is present. Aortic atherosclerotic calcification is present. Central line in place with the tip in the SVC. Mediastinum/Nodes: No mediastinal or hilar mass or adenopathy. Lungs/Pleura: The lungs are clear.  No pleural effusion. Upper Abdomen: Previous cholecystectomy.  No acute finding. Musculoskeletal: Status post incision and debridement of a fluid collection in the right axilla/superolateral breast. Air is present centrally within the region. There is no evidence of an undrained component or loculation. IMPRESSION: Status post incision and debridement of a right axillary/superolateral breast abscess. No evidence of undrained component or loculation. Aortic Atherosclerosis (ICD10-I70.0). coronary artery calcification. Electronically Signed   By: Nelson Chimes M.D.   On: 01/12/2021 14:47   CT Angio Chest PE W and/or Wo Contrast  Result Date: 01/06/2021 CLINICAL DATA:  Shortness of breath. Abdominal infection, fever and pain. EXAM: CT ANGIOGRAPHY CHEST CT ABDOMEN AND PELVIS WITH CONTRAST TECHNIQUE: Multidetector CT imaging of the chest was performed using the standard protocol during bolus administration of intravenous contrast. Multiplanar CT  image reconstructions and MIPs were obtained to evaluate the vascular anatomy. Multidetector CT imaging of the abdomen and pelvis was performed using the standard protocol during bolus administration of intravenous contrast. CONTRAST:  69m OMNIPAQUE IOHEXOL 350 MG/ML SOLN COMPARISON:  None. FINDINGS: CTA CHEST FINDINGS Cardiovascular: Satisfactory opacification of the pulmonary arteries to the segmental level. No evidence of pulmonary embolism. The heart is borderline enlarged. There is no pericardial effusion. Aorta is normal in size. There are atherosclerotic calcifications of the aorta. Chest port catheter tip ends in the SVC. Mediastinum/Nodes: No enlarged mediastinal, hilar, or axillary lymph nodes. Thyroid gland, trachea, and esophagus demonstrate no significant findings. Lungs/Pleura: There is mild atelectasis in the bilateral lower lobes. The lungs otherwise appear clear. There is no pleural effusion or pneumothorax. Trachea and central airways are patent. Musculoskeletal: No acute fracture. Right sided chest port is present. There is marked subcutaneous edema in the anterior right chest wall surrounding the port. There is some soft tissue air in the lower right neck, likely related to port placement, although infection is not excluded. There are 2 fluid collections within the right breast with mild surrounding stranding. One is located in the lateral right breast measuring 5.1 by 3.1 by 4.8 cm. The other is located in the superior central breast measuring 4.2 by 1.5 by 2.5 cm. Review of the MIP images confirms the above findings. CT ABDOMEN and PELVIS FINDINGS Hepatobiliary: The gallbladder surgically absent. No focal liver lesions are identified. There is no biliary ductal dilatation. Pancreas: There is a fat attenuation lesion in the neck of the pancreas measuring 10 x 17 mm compatible with lipoma. Pancreas otherwise appears within normal limits. Spleen: Normal in size without focal abnormality.  Adrenals/Urinary Tract: Adrenal glands are unremarkable. Kidneys are normal, without renal calculi, focal lesion, or hydronephrosis. Bladder is unremarkable. Stomach/Bowel: There is no  evidence for bowel obstruction or free air. The appendix is within normal limits. There is sigmoid colon diverticulosis. There is some mild inflammatory stranding surrounding diverticula in the sigmoid colon compatible with acute diverticulitis. There is no evidence for perforation or abscess. Small bowel and stomach are within normal limits. Vascular/Lymphatic: Aortic atherosclerosis. No enlarged abdominal or pelvic lymph nodes. Reproductive: Status post hysterectomy. No adnexal masses. Other: No abdominal wall hernia or abnormality. No abdominopelvic ascites. Musculoskeletal: Degenerative changes affect the spine. Review of the MIP images confirms the above findings. IMPRESSION: 1. No evidence for pulmonary embolism. 2. Right chest port in place. There is significant subcutaneous stranding surrounding the right chest port with some soft tissue air in the lower right neck. Findings may be related to recent port placement; however, soft tissue infection is not excluded. 3. There are 2 right breast fluid collections, indeterminate. The largest measures up to 5.1 cm. 4. Acute uncomplicated sigmoid colon diverticulitis. 5. Pancreatic lipoma. Electronically Signed   By: Ronney Asters M.D.   On: 01/06/2021 20:58   CT ABDOMEN PELVIS W CONTRAST  Result Date: 01/06/2021 CLINICAL DATA:  Shortness of breath. Abdominal infection, fever and pain. EXAM: CT ANGIOGRAPHY CHEST CT ABDOMEN AND PELVIS WITH CONTRAST TECHNIQUE: Multidetector CT imaging of the chest was performed using the standard protocol during bolus administration of intravenous contrast. Multiplanar CT image reconstructions and MIPs were obtained to evaluate the vascular anatomy. Multidetector CT imaging of the abdomen and pelvis was performed using the standard protocol during  bolus administration of intravenous contrast. CONTRAST:  33m OMNIPAQUE IOHEXOL 350 MG/ML SOLN COMPARISON:  None. FINDINGS: CTA CHEST FINDINGS Cardiovascular: Satisfactory opacification of the pulmonary arteries to the segmental level. No evidence of pulmonary embolism. The heart is borderline enlarged. There is no pericardial effusion. Aorta is normal in size. There are atherosclerotic calcifications of the aorta. Chest port catheter tip ends in the SVC. Mediastinum/Nodes: No enlarged mediastinal, hilar, or axillary lymph nodes. Thyroid gland, trachea, and esophagus demonstrate no significant findings. Lungs/Pleura: There is mild atelectasis in the bilateral lower lobes. The lungs otherwise appear clear. There is no pleural effusion or pneumothorax. Trachea and central airways are patent. Musculoskeletal: No acute fracture. Right sided chest port is present. There is marked subcutaneous edema in the anterior right chest wall surrounding the port. There is some soft tissue air in the lower right neck, likely related to port placement, although infection is not excluded. There are 2 fluid collections within the right breast with mild surrounding stranding. One is located in the lateral right breast measuring 5.1 by 3.1 by 4.8 cm. The other is located in the superior central breast measuring 4.2 by 1.5 by 2.5 cm. Review of the MIP images confirms the above findings. CT ABDOMEN and PELVIS FINDINGS Hepatobiliary: The gallbladder surgically absent. No focal liver lesions are identified. There is no biliary ductal dilatation. Pancreas: There is a fat attenuation lesion in the neck of the pancreas measuring 10 x 17 mm compatible with lipoma. Pancreas otherwise appears within normal limits. Spleen: Normal in size without focal abnormality. Adrenals/Urinary Tract: Adrenal glands are unremarkable. Kidneys are normal, without renal calculi, focal lesion, or hydronephrosis. Bladder is unremarkable. Stomach/Bowel: There is no  evidence for bowel obstruction or free air. The appendix is within normal limits. There is sigmoid colon diverticulosis. There is some mild inflammatory stranding surrounding diverticula in the sigmoid colon compatible with acute diverticulitis. There is no evidence for perforation or abscess. Small bowel and stomach are within normal limits. Vascular/Lymphatic:  Aortic atherosclerosis. No enlarged abdominal or pelvic lymph nodes. Reproductive: Status post hysterectomy. No adnexal masses. Other: No abdominal wall hernia or abnormality. No abdominopelvic ascites. Musculoskeletal: Degenerative changes affect the spine. Review of the MIP images confirms the above findings. IMPRESSION: 1. No evidence for pulmonary embolism. 2. Right chest port in place. There is significant subcutaneous stranding surrounding the right chest port with some soft tissue air in the lower right neck. Findings may be related to recent port placement; however, soft tissue infection is not excluded. 3. There are 2 right breast fluid collections, indeterminate. The largest measures up to 5.1 cm. 4. Acute uncomplicated sigmoid colon diverticulitis. 5. Pancreatic lipoma. Electronically Signed   By: Ronney Asters M.D.   On: 01/06/2021 20:58   DG Chest Port 1 View  Result Date: 01/06/2021 CLINICAL DATA:  Hypoxia and fevers EXAM: PORTABLE CHEST 1 VIEW COMPARISON:  12/30/2020 FINDINGS: Stable right-sided chest wall port is noted with catheter tip in the mid superior vena cava. No pneumothorax is noted. Cardiac shadow is within normal limits. The lungs are well aerated without focal infiltrate or effusion. No bony abnormality is noted. IMPRESSION: No acute abnormality seen. Electronically Signed   By: Inez Catalina M.D.   On: 01/06/2021 19:26   DG C-Arm 1-60 Min-No Report  Result Date: 12/30/2020 Fluoroscopy was utilized by the requesting physician.  No radiographic interpretation.   US BREAST ASPIRATION RIGHT  Result Date:  01/07/2021 INDICATION: Breast cancer history status post recent RIGHT lumpectomy and axillary biopsy. Febrile, infectious workup. EXAM: US-GUIDED RIGHT AXILLARY COLLECTION ASPIRATION COMPARISON:  CT chest, 01/06/2021.  OR fluoroscopy, 12/30/2020. MEDICATIONS: The patient is currently admitted to the hospital and receiving intravenous antibiotics. The antibiotics were administered within an appropriate time frame prior to the initiation of the procedure. ANESTHESIA/SEDATION: Local anesthetic was administered. CONTRAST:  None COMPLICATIONS: None immediate. PROCEDURE: Informed written consent was obtained from the patient after a discussion of the risks, benefits and alternatives to treatment. Preprocedural ultrasound scanning demonstrated well circumscribed, round RIGHT axillary anechoic collection measuring approximately 5.2 x 4.5 cm. A timeout was performed prior to the initiation of the procedure. The RIGHT axilla was prepped and draped in the usual sterile fashion. The overlying soft tissues were anesthetized with 1% lidocaine with epinephrine. Under direct ultrasound guidance, a 18 gauge trocar needle was advanced into the abscess/fluid collection. Multiple ultrasound images were saved for procedural documentation purposes. Next, approximately 40 mL of serosanguineous fluid was aspirated from the collection. A representative sample of aspirated fluid was capped and sent to the laboratory for analysis. The needle was removed and superficial hemostasis was achieved with manual compression. A dressing was placed. The patient tolerated the procedure well without immediate postprocedural complication. IMPRESSION: Successful US guided of 40 mL of serosanguineous fluid from RIGHT axillary fluid collection. A representative aspirated sample was sent to the laboratory as requested by the ordering clinical team. Michaelle Birks, MD [facility] Radiology Electronically Signed    By: Michaelle Birks M.D.   On: 01/07/2021 13:56     ELIGIBLE FOR AVAILABLE RESEARCH PROTOCOL: no  ASSESSMENT: 65 y.o. [place] woman status post right breast upper inner quadrant biopsy 10/04/2020 for clinical T1b N0, stage IA invasive lobular carcinoma, E-cadherin negative, grade 1, estrogen receptor positive, progesterone receptor and HER2 negative, with an MIB-1 of 10%  (1) status post right lumpectomy and sentinel lymph node sampling 11/05/2020 for a PT1b pN0, stage IA invasive lobular carcinoma, grade 2  (2) Oncotype score  of 32 predicts a risk of recurrence outside the breast within the next 9 years of 20% if the patient's only systemic therapy is antiestrogens for 5 years.  It also predicts significant benefit from chemotherapy.  (3) adjuvant chemotherapy with cyclophosphamide and docetaxel every 21 days x 4 to start 12/31/2020  (4) adjuvant radiation to follow  (5) antiestrogens to start at the completion of local treatment.   PLAN: Candela has recovered quite well from her previous hospitalization.  She completes antibiotics today.  Her labs have normalized.  In review of her counts prior to and during her hospitalization they never declined.  She met with Dr. [name] today as well.  I discussed whether or not she should have a dose reduction in her chemotherapy due to her previous hospitalization.  He looked at her breast incision and reviewed her medication list.  After discussion they decided to keep the dosage the same and she will return on November 3 for IV fluids and Udenyca, November 4 for IV fluids, and November 5 for IV fluids.  Normal proceed with chemotherapy today with docetaxel and cyclophosphamide.  I am writing her for Diflucan for her to take daily for the next week since she is experiencing a yeast infection.  We reviewed her blood sugars.  She will only take dexamethasone 4 mg the day prior to her chemotherapy.  We dose reduce the IV dexamethasone she receives on  chemo day from 10 mg to 4 mg.  Also, she will only take Compazine the days following her chemotherapy 4 times a day as needed and we we will follow-up with her when she returns for her injection if she is having any issues with increased nausea due to this change.  Hopefully this will help improve her glucose control.  We talked to Naveya about going to church.  Dr. [name] suggested that she sit at the end of the pew with her husband on the other side and she wear a mask.  We reviewed again that her labs did not decline following her chemotherapy and so if she follow safe practices she should be able to participate in church.  Amiayah will return as noted above on November 3 fourth and fifth.  She will also return on November 22 for labs, follow-up, and cycle 3 of her chemotherapy.    Total encounter time 20 minutes.Wilber Bihari, NP 01/21/21 8:49 AM Medical Oncology and Hematology [facility] [place], [place] [address] Tel. [phone]    Fax. [phone]   ADDENDUM: Amalee has recovered remarkably well.  Her counts were never low.  She has not developed any peripheral neuropathy and she is very eager to continue chemotherapy without delays.  Accordingly we are proceeding.  She will have supportive fluids as before.  I offered to move her Thanksgiving week treatment to the week following but again she is very eager to get through with this and so long as there are no new developments I think she will be able to be treated on 02/11/2021 as well.  I personally saw this patient and performed a substantive portion of this encounter with the listed APP documented above.   [name], MD Medical Oncology and Hematology [facility] [address] [place], [place] [address] Tel. [phone]    Fax. [phone]      *Total Encounter Time as defined by the Centers for Medicare and Medicaid Services includes, in addition to the  face-to-face time  of a patient visit (documented in the note above) non-face-to-face time: obtaining and reviewing outside history, ordering and reviewing medications, tests or procedures, care coordination (communications with other health care professionals or caregivers) and documentation in the medical record.

## 2021-01-21 NOTE — Patient Instructions (Signed)
[facility] ONCOLOG[facility]  Discharge Instructions: Thank you for choosing [facility] to provide your oncology and hematology care.   If you have a lab appointment with the [facility], please go directly to the [facility] and check in at the registration area.   Wear comfortable clothing and clothing appropriate for easy access to any Portacath or PICC line.   We strive to give you quality time with your provider. You may need to reschedule your appointment if you arrive late (15 or more minutes).  Arriving late affects you and other patients whose appointments are after yours.  Also, if you miss three or more appointments without notifying the office, you may be dismissed from the clinic at the provider's discretion.      For prescription refill requests, have your pharmacy contact our office and allow 72 hours for refills to be completed.    Today you received the following chemotherapy and/or immunotherapy agents Taxotere/Cytoxan      To help prevent nausea and vomiting after your treatment, we encourage you to take your nausea medication as directed.  BELOW ARE SYMPTOMS THAT SHOULD BE REPORTED IMMEDIATELY: *FEVER GREATER THAN 100.4 F (38 C) OR HIGHER *CHILLS OR SWEATING *NAUSEA AND VOMITING THAT IS NOT CONTROLLED WITH YOUR NAUSEA MEDICATION *UNUSUAL SHORTNESS OF BREATH *UNUSUAL BRUISING OR BLEEDING *URINARY PROBLEMS (pain or burning when urinating, or frequent urination) *BOWEL PROBLEMS (unusual diarrhea, constipation, pain near the anus) TENDERNESS IN MOUTH AND THROAT WITH OR WITHOUT PRESENCE OF ULCERS (sore throat, sores in mouth, or a toothache) UNUSUAL RASH, SWELLING OR PAIN  UNUSUAL VAGINAL DISCHARGE OR ITCHING   Items with * indicate a potential emergency and should be followed up as soon as possible or go to the Emergency Department if any problems should occur.  Please show the CHEMOTHERAPY ALERT CARD or IMMUNOTHERAPY ALERT CARD at  check-in to the Emergency Department and triage nurse.  Should you have questions after your visit or need to cancel or reschedule your appointment, please contact [facility]  Dept: [phone]  and follow the prompts.  Office hours are 8:00 a.m. to 4:30 p.m. Monday - Friday. Please note that voicemails left after 4:00 p.m. may not be returned until the following business day.  We are closed weekends and major holidays. You have access to a nurse at all times for urgent questions. Please call the main number to the clinic Dept: [phone] and follow the prompts.   For any non-urgent questions, you may also contact your provider using MyChart. We now offer e-Visits for anyone 25 and older to request care online for non-urgent symptoms. For details visit [email or web address].   Also download the MyChart app! Go to the app store, search "MyChart", open the app, select [facility], and log in with your MyChart username and password.  Due to Covid, a mask is required upon entering the hospital/clinic. If you do not have a mask, one will be given to you upon arrival. For doctor visits, patients may have 1 support person aged 101 or older with them. For treatment visits, patients cannot have anyone with them due to current Covid guidelines and our immunocompromised population.

## 2021-01-23 ENCOUNTER — Encounter: Payer: Self-pay | Admitting: *Deleted

## 2021-01-23 ENCOUNTER — Inpatient Hospital Stay (HOSPITAL_BASED_OUTPATIENT_CLINIC_OR_DEPARTMENT_OTHER): Payer: Medicare HMO

## 2021-01-23 ENCOUNTER — Ambulatory Visit: Payer: Medicare HMO

## 2021-01-23 ENCOUNTER — Encounter: Payer: Self-pay | Admitting: Oncology

## 2021-01-23 ENCOUNTER — Other Ambulatory Visit: Payer: Self-pay

## 2021-01-23 ENCOUNTER — Other Ambulatory Visit: Payer: Self-pay | Admitting: Oncology

## 2021-01-23 VITALS — BP 137/76 | HR 98 | Temp 98.6°F | Resp 18 | Ht 67.0 in | Wt 204.0 lb

## 2021-01-23 DIAGNOSIS — Z5189 Encounter for other specified aftercare: Secondary | ICD-10-CM | POA: Diagnosis not present

## 2021-01-23 DIAGNOSIS — Z95828 Presence of other vascular implants and grafts: Secondary | ICD-10-CM | POA: Diagnosis not present

## 2021-01-23 DIAGNOSIS — Z170 Estrogen receptor positive status [ER+]: Secondary | ICD-10-CM | POA: Diagnosis not present

## 2021-01-23 DIAGNOSIS — Z5111 Encounter for antineoplastic chemotherapy: Secondary | ICD-10-CM | POA: Diagnosis not present

## 2021-01-23 DIAGNOSIS — C50211 Malignant neoplasm of upper-inner quadrant of right female breast: Secondary | ICD-10-CM

## 2021-01-23 MED ORDER — HEPARIN SOD (PORK) LOCK FLUSH 100 UNIT/ML IV SOLN
500.0000 [IU] | Freq: Once | INTRAVENOUS | Status: AC
  Administered 2021-01-23: 500 [IU]

## 2021-01-23 MED ORDER — PEGFILGRASTIM-CBQV 6 MG/0.6ML ~~LOC~~ SOSY
6.0000 mg | PREFILLED_SYRINGE | Freq: Once | SUBCUTANEOUS | Status: AC
  Administered 2021-01-23: 6 mg via SUBCUTANEOUS

## 2021-01-23 MED ORDER — PEGFILGRASTIM-CBQV 6 MG/0.6ML ~~LOC~~ SOSY: 6.0000 mg | PREFILLED_SYRINGE | Freq: Once | SUBCUTANEOUS | Status: DC

## 2021-01-23 MED ORDER — SODIUM CHLORIDE 0.9% FLUSH
10.0000 mL | Freq: Once | INTRAVENOUS | Status: AC
Start: 2021-01-23 — End: 2021-01-23
  Administered 2021-01-23: 10 mL

## 2021-01-23 MED ORDER — SODIUM CHLORIDE 0.9 % IV SOLN: Freq: Once | INTRAVENOUS | Status: AC

## 2021-01-23 NOTE — Progress Notes (Signed)
[name] was having pretty horrible hiccups.  This is going to be due to the dexamethasone.  When we see her next we will see if we can arrange for her not to need to take Decadron and still have good nausea control through her chemo.

## 2021-01-23 NOTE — Progress Notes (Signed)
Pt tolerated fluids and injection well. Pt had concerns about excessive hiccups and wanted to contact provider about medication to help. Sent inbasket to provider. Pt ambulated with w/c out of facility. Advised to call with concerns. Pt verbalized understanding.

## 2021-01-24 ENCOUNTER — Inpatient Hospital Stay: Payer: Medicare HMO

## 2021-01-24 VITALS — BP 143/72 | HR 107 | Temp 98.7°F | Resp 18

## 2021-01-24 DIAGNOSIS — C50211 Malignant neoplasm of upper-inner quadrant of right female breast: Secondary | ICD-10-CM | POA: Diagnosis not present

## 2021-01-24 DIAGNOSIS — Z170 Estrogen receptor positive status [ER+]: Secondary | ICD-10-CM | POA: Diagnosis not present

## 2021-01-24 DIAGNOSIS — Z5111 Encounter for antineoplastic chemotherapy: Secondary | ICD-10-CM | POA: Diagnosis not present

## 2021-01-24 DIAGNOSIS — Z5189 Encounter for other specified aftercare: Secondary | ICD-10-CM | POA: Diagnosis not present

## 2021-01-24 DIAGNOSIS — Z95828 Presence of other vascular implants and grafts: Secondary | ICD-10-CM

## 2021-01-24 MED ORDER — HEPARIN SOD (PORK) LOCK FLUSH 100 UNIT/ML IV SOLN
500.0000 [IU] | Freq: Once | INTRAVENOUS | Status: AC
  Administered 2021-01-24: 500 [IU]

## 2021-01-24 MED ORDER — SODIUM CHLORIDE 0.9% FLUSH
10.0000 mL | Freq: Once | INTRAVENOUS | Status: AC
  Administered 2021-01-24: 10 mL

## 2021-01-24 MED ORDER — SODIUM CHLORIDE 0.9 % IV SOLN: Freq: Once | INTRAVENOUS | Status: AC

## 2021-01-24 NOTE — Patient Instructions (Signed)

## 2021-01-25 ENCOUNTER — Other Ambulatory Visit: Payer: Self-pay | Admitting: Hematology & Oncology

## 2021-01-25 ENCOUNTER — Inpatient Hospital Stay: Payer: Medicare HMO

## 2021-01-25 ENCOUNTER — Other Ambulatory Visit: Payer: Self-pay

## 2021-01-25 VITALS — BP 149/65 | HR 105 | Temp 98.0°F | Resp 18

## 2021-01-25 DIAGNOSIS — C50211 Malignant neoplasm of upper-inner quadrant of right female breast: Secondary | ICD-10-CM | POA: Diagnosis not present

## 2021-01-25 DIAGNOSIS — Z95828 Presence of other vascular implants and grafts: Secondary | ICD-10-CM

## 2021-01-25 DIAGNOSIS — Z5189 Encounter for other specified aftercare: Secondary | ICD-10-CM | POA: Diagnosis not present

## 2021-01-25 DIAGNOSIS — Z170 Estrogen receptor positive status [ER+]: Secondary | ICD-10-CM

## 2021-01-25 DIAGNOSIS — Z5111 Encounter for antineoplastic chemotherapy: Secondary | ICD-10-CM | POA: Diagnosis not present

## 2021-01-25 MED ORDER — SODIUM CHLORIDE 0.9 % IV SOLN: Freq: Once | INTRAVENOUS | Status: AC

## 2021-01-25 MED ORDER — CIPROFLOXACIN HCL 500 MG PO TABS: 500.0000 mg | ORAL_TABLET | Freq: Two times a day (BID) | ORAL | 0 refills | Status: DC

## 2021-01-25 NOTE — Patient Instructions (Signed)

## 2021-02-04 ENCOUNTER — Telehealth: Payer: Self-pay | Admitting: *Deleted

## 2021-02-04 NOTE — Telephone Encounter (Signed)
This RN spoke with pt per her call stating she has developed sinus congestion and wanted to verify what to take. She denies any fevers,chills or cough " just want to know to help if I needed something.  She is currently using Claritin- and per discussion she can use pseudoephedrine and muccinex if needed.

## 2021-02-11 ENCOUNTER — Telehealth: Payer: Self-pay | Admitting: Adult Health

## 2021-02-11 ENCOUNTER — Inpatient Hospital Stay: Payer: Medicare HMO | Admitting: Adult Health

## 2021-02-11 ENCOUNTER — Encounter: Payer: Self-pay | Admitting: Adult Health

## 2021-02-11 ENCOUNTER — Other Ambulatory Visit: Payer: Self-pay

## 2021-02-11 ENCOUNTER — Inpatient Hospital Stay: Payer: Medicare HMO

## 2021-02-11 DIAGNOSIS — C50211 Malignant neoplasm of upper-inner quadrant of right female breast: Secondary | ICD-10-CM

## 2021-02-11 DIAGNOSIS — Z5111 Encounter for antineoplastic chemotherapy: Secondary | ICD-10-CM | POA: Diagnosis not present

## 2021-02-11 DIAGNOSIS — Z5189 Encounter for other specified aftercare: Secondary | ICD-10-CM | POA: Diagnosis not present

## 2021-02-11 DIAGNOSIS — Z170 Estrogen receptor positive status [ER+]: Secondary | ICD-10-CM

## 2021-02-11 DIAGNOSIS — Z95828 Presence of other vascular implants and grafts: Secondary | ICD-10-CM

## 2021-02-11 LAB — COMPREHENSIVE METABOLIC PANEL
ALT: 19 U/L (ref 0–44)
AST: 19 U/L (ref 15–41)
Albumin: 3.7 g/dL (ref 3.5–5.0)
Alkaline Phosphatase: 73 U/L (ref 38–126)
Anion gap: 9 (ref 5–15)
BUN: 11 mg/dL (ref 8–23)
CO2: 22 mmol/L (ref 22–32)
Calcium: 8.6 mg/dL — ABNORMAL LOW (ref 8.9–10.3)
Chloride: 108 mmol/L (ref 98–111)
Creatinine, Ser: 0.67 mg/dL (ref 0.44–1.00)
GFR, Estimated: >60 mL/min (ref >60)
Glucose, Bld: 171 mg/dL — ABNORMAL HIGH (ref 70–99)
Potassium: 4.2 mmol/L (ref 3.5–5.1)
Sodium: 139 mmol/L (ref 135–145)
Total Bilirubin: <0.2 mg/dL — ABNORMAL LOW (ref 0.3–1.2)
Total Protein: 7.1 g/dL (ref 6.5–8.1)

## 2021-02-11 LAB — CBC WITH DIFFERENTIAL/PLATELET
Abs Immature Granulocytes: 0.02 10*3/uL (ref 0.00–0.07)
Basophils Absolute: 0.2 10*3/uL — ABNORMAL HIGH (ref 0.0–0.1)
Basophils Relative: 3 %
Eosinophils Absolute: 0.1 10*3/uL (ref 0.0–0.5)
Eosinophils Relative: 1 %
HCT: 33.6 % — ABNORMAL LOW (ref 36.0–46.0)
Hemoglobin: 10.9 g/dL — ABNORMAL LOW (ref 12.0–15.0)
Immature Granulocytes: 0 %
Lymphocytes Relative: 28 %
Lymphs Abs: 2 10*3/uL (ref 0.7–4.0)
MCH: 29.3 pg (ref 26.0–34.0)
MCHC: 32.4 g/dL (ref 30.0–36.0)
MCV: 90.3 fL (ref 80.0–100.0)
Monocytes Absolute: 0.7 10*3/uL (ref 0.1–1.0)
Monocytes Relative: 10 %
Neutro Abs: 4.1 10*3/uL (ref 1.7–7.7)
Neutrophils Relative %: 58 %
Platelets: 268 10*3/uL (ref 150–400)
RBC: 3.72 MIL/uL — ABNORMAL LOW (ref 3.87–5.11)
RDW: 15.1 % (ref 11.5–15.5)
WBC: 7 10*3/uL (ref 4.0–10.5)
nRBC: 0 % (ref 0.0–0.2)

## 2021-02-11 MED ORDER — SODIUM CHLORIDE 0.9% FLUSH
10.0000 mL | Freq: Once | INTRAVENOUS | Status: AC
  Administered 2021-02-11: 10 mL

## 2021-02-11 MED ORDER — DEXAMETHASONE SODIUM PHOSPHATE 10 MG/ML IJ SOLN
4.0000 mg | Freq: Once | INTRAMUSCULAR | Status: AC
  Administered 2021-02-11: 4 mg via INTRAVENOUS
  Filled 2021-02-11: qty 1

## 2021-02-11 MED ORDER — SODIUM CHLORIDE 0.9 % IV SOLN
600.0000 mg/m2 | Freq: Once | INTRAVENOUS | Status: AC
  Administered 2021-02-11: 1240 mg via INTRAVENOUS
  Filled 2021-02-11: qty 62

## 2021-02-11 MED ORDER — LORAZEPAM 0.5 MG PO TABS: 0.5000 mg | ORAL_TABLET | Freq: Every evening | ORAL | 0 refills | Status: DC | PRN

## 2021-02-11 MED ORDER — SODIUM CHLORIDE 0.9 % IV SOLN: Freq: Once | INTRAVENOUS | Status: AC

## 2021-02-11 MED ORDER — SODIUM CHLORIDE 0.9% FLUSH
10.0000 mL | INTRAVENOUS | Status: DC | PRN
  Administered 2021-02-11: 10 mL

## 2021-02-11 MED ORDER — HEPARIN SOD (PORK) LOCK FLUSH 100 UNIT/ML IV SOLN
500.0000 [IU] | Freq: Once | INTRAVENOUS | Status: AC | PRN
  Administered 2021-02-11: 500 [IU]

## 2021-02-11 MED ORDER — SODIUM CHLORIDE 0.9 % IV SOLN
75.0000 mg/m2 | Freq: Once | INTRAVENOUS | Status: AC
  Administered 2021-02-11: 160 mg via INTRAVENOUS
  Filled 2021-02-11: qty 16

## 2021-02-11 MED ORDER — SODIUM CHLORIDE 0.9 % IV SOLN: 4.0000 mg | Freq: Once | INTRAVENOUS | Status: DC

## 2021-02-11 MED ORDER — PALONOSETRON HCL INJECTION 0.25 MG/5ML
0.2500 mg | Freq: Once | INTRAVENOUS | Status: AC
  Administered 2021-02-11: 0.25 mg via INTRAVENOUS
  Filled 2021-02-11: qty 5

## 2021-02-11 NOTE — Patient Instructions (Signed)
[facility] MEDICAL ONCOLOGY  Discharge Instructions: Thank you for choosing [facility] to provide your oncology and hematology care.   If you have a lab appointment with the Cancer Center, please go directly to the Cancer Center and check in at the registration area.   Wear comfortable clothing and clothing appropriate for easy access to any Portacath or PICC line.   We strive to give you quality time with your provider. You may need to reschedule your appointment if you arrive late (15 or more minutes).  Arriving late affects you and other patients whose appointments are after yours.  Also, if you miss three or more appointments without notifying the office, you may be dismissed from the clinic at the provider's discretion.      For prescription refill requests, have your pharmacy contact our office and allow 72 hours for refills to be completed.    Today you received the following chemotherapy and/or immunotherapy agents: Docetaxel (Taxotere) and Cyclophosphamide (Cytoxan)   To help prevent nausea and vomiting after your treatment, we encourage you to take your nausea medication as directed.  BELOW ARE SYMPTOMS THAT SHOULD BE REPORTED IMMEDIATELY: *FEVER GREATER THAN 100.4 F (38 C) OR HIGHER *CHILLS OR SWEATING *NAUSEA AND VOMITING THAT IS NOT CONTROLLED WITH YOUR NAUSEA MEDICATION *UNUSUAL SHORTNESS OF BREATH *UNUSUAL BRUISING OR BLEEDING *URINARY PROBLEMS (pain or burning when urinating, or frequent urination) *BOWEL PROBLEMS (unusual diarrhea, constipation, pain near the anus) TENDERNESS IN MOUTH AND THROAT WITH OR WITHOUT PRESENCE OF ULCERS (sore throat, sores in mouth, or a toothache) UNUSUAL RASH, SWELLING OR PAIN  UNUSUAL VAGINAL DISCHARGE OR ITCHING   Items with * indicate a potential emergency and should be followed up as soon as possible or go to the Emergency Department if any problems should occur.  Please show the CHEMOTHERAPY ALERT CARD or  IMMUNOTHERAPY ALERT CARD at check-in to the Emergency Department and triage nurse.  Should you have questions after your visit or need to cancel or reschedule your appointment, please contact [facility] MEDICAL ONCOLOGY  Dept: [phone]  and follow the prompts.  Office hours are 8:00 a.m. to 4:30 p.m. Monday - Friday. Please note that voicemails left after 4:00 p.m. may not be returned until the following business day.  We are closed weekends and major holidays. You have access to a nurse at all times for urgent questions. Please call the main number to the clinic Dept: [phone] and follow the prompts.   For any non-urgent questions, you may also contact your provider using MyChart. We now offer e-Visits for anyone 18 and older to request care online for non-urgent symptoms. For details visit [email or web address].   Also download the MyChart app! Go to the app store, search "MyChart", open the app, select [facility], and log in with your MyChart username and password.  Due to Covid, a mask is required upon entering the hospital/clinic. If you do not have a mask, one will be given to you upon arrival. For doctor visits, patients may have 1 support person aged 18 or older with them. For treatment visits, patients cannot have anyone with them due to current Covid guidelines and our immunocompromised population.  

## 2021-02-11 NOTE — Telephone Encounter (Signed)
Scheduled appointment per 11/22 los. Patient aware.

## 2021-02-11 NOTE — Progress Notes (Signed)
[place]  [phone] [phone]     ID: [name] DOB: 1955/09/02  MR#: [id number]  EGB#:[id number]  Patient Care Team: Antony Contras, MD as PCP - General (Family Medicine) Mauro Kaufmann, RN as Oncology Nurse Navigator Rockwell Germany, RN as Oncology Nurse Navigator Rolm Bookbinder, MD as Consulting Physician (General Surgery) Magrinat, Virgie Dad, MD as Consulting Physician (Oncology) Eppie Gibson, MD as Attending Physician (Radiation Oncology) Arvella Nigh, MD as Consulting Physician (Obstetrics and Gynecology) Jacelyn Pi, MD as Referring Physician (Endocrinology) Scot Dock, NP OTHER MD:  CHIEF COMPLAINT: Estrogen receptor positive breast cancer  CURRENT TREATMENT: Adjuvant chemotherapy   INTERVAL HISTORY: [name] returns today for follow up and treatment of her estrogen receptor positive breast cancer. She is accompanied by her husband.   She began adjuvant docetaxel and cyclophosphamide 12/31/2020. This will be given every 21 days x4. Today is cycle 3 day 1 of treatment.  She receives Udenyca for WBC support on day 3 of treatment.  [name] received IV fluids on days 3 and 4 following her chemotherapy administration.  She notes that she felt much better after receiving the IV fluids.  She would like to receive these with every cycle to avoid further/future hospitalizations.  [name] is doing well she still has her hair she has no peripheral neuropathy her activity level is slightly decreased due to fatigue.  She does note some nasal congestion in her frontal sinuses.  She has been taking extended release Sudafed.  She notes that she has had difficulty sleeping since starting this.  She is not taking any extended release Sudafed at night.  Her blood pressure is slightly elevated today.  She does not have a way to check her blood pressure at home.  She has tried tramadol in the past to help her sleep, however she is ran out of the  tramadol.  She says that she does not have any active pain.  REVIEW OF SYSTEMS: Review of Systems  Constitutional:  Positive for fatigue. Negative for appetite change, chills, fever and unexpected weight change.  HENT:   Negative for hearing loss, lump/mass and trouble swallowing.   Eyes:  Negative for eye problems and icterus.  Respiratory:  Negative for chest tightness, cough and shortness of breath.   Cardiovascular:  Negative for chest pain, leg swelling and palpitations.  Gastrointestinal:  Negative for abdominal distention, abdominal pain, constipation, diarrhea, nausea and vomiting.  Endocrine: Negative for hot flashes.  Genitourinary:  Negative for difficulty urinating.   Musculoskeletal:  Negative for arthralgias.  Skin:  Negative for itching and rash.  Neurological:  Negative for dizziness, extremity weakness, headaches and numbness.  Hematological:  Negative for adenopathy. Does not bruise/bleed easily.  Psychiatric/Behavioral:  Negative for depression. The patient is not nervous/anxious.      COVID 19 VACCINATION STATUS: Pfizer x3 as of July 2022   HISTORY OF CURRENT ILLNESS: From the original intake note:  DELLIA DONNELLY had routine screening mammography on 09/04/2020 showing a possible abnormality in the right breast. She underwent right diagnostic mammography with tomography and right breast ultrasonography at [facility] on 09/30/2020 showing: breast density category B; 0.7 cm mass in right breast at 12:30; normal right axillary lymph nodes.  Accordingly on 10/04/2020 she proceeded to biopsy of the right breast area in question. The pathology from this procedure ([id number]) showed: invasive mammary carcinoma, e-cadherin negative, grade 1; atypical lobular hyperplasia. Prognostic indicators significant for: estrogen receptor, 90% positive with moderate staining  intensity and progesterone receptor, 0% negative. Proliferation marker Ki67 at 10%. HER2 negative by  immunohistochemistry (1+).   Cancer Staging  Malignant neoplasm of upper-inner quadrant of right breast in female, estrogen receptor positive ([place]) Staging form: Breast, AJCC 8th Edition - Clinical stage from 10/16/2020: Stage IA (cT1b, cN0, cM0, G1, ER+, PR-, HER2-) - Signed by [name], MD on 10/16/2020 Stage prefix: Initial diagnosis Histologic grading system: 3 grade system Laterality: Right Staged by: Pathologist and managing physician Stage used in treatment planning: Yes National guidelines used in treatment planning: Yes Type of national guideline used in treatment planning: NCCN  The patient's subsequent history is as detailed below.   PAST MEDICAL HISTORY: Past Medical History:  Diagnosis Date   Cancer ([place])    Cataract    removed   Diabetes mellitus without complication ([place])    History of kidney stones    Hyperlipidemia    Hypertension    PONV (postoperative nausea and vomiting)     PAST SURGICAL HISTORY: Past Surgical History:  Procedure Laterality Date   ABDOMINAL HYSTERECTOMY     BREAST LUMPECTOMY Right 01/09/2021   Procedure: incision drainage right breast abscess;  Surgeon: Rolm Bookbinder, MD;  Location: [facility];  Service: General;  Laterality: Right;   BREAST LUMPECTOMY WITH RADIOACTIVE SEED AND SENTINEL LYMPH NODE BIOPSY Right 11/05/2020   Procedure: RIGHT BREAST LUMPECTOMY WITH RADIOACTIVE SEED AND RIGHT AXILLARY SENTINEL LYMPH NODE BIOPSY;  Surgeon: Rolm Bookbinder, MD;  Location: [facility];  Service: General;  Laterality: Right;   CHOLECYSTECTOMY     KNEE SURGERY     PORTACATH PLACEMENT Right 12/30/2020   Procedure: INSERTION PORT-A-CATH;  Surgeon: Rolm Bookbinder, MD;  Location: [facility];  Service: General;  Laterality: Right;    FAMILY HISTORY: Family History  Problem Relation Age of Onset   Heart attack Mother    Diabetes Father    Colon cancer Maternal Grandmother    Breast cancer Cousin        dx early 59's    Her father died at age 54 from diabetes complications. Her mother died at age 33 from MI. [name] has two brothers (and no sisters). She reports breast cancer in a maternal cousin in her early 58's and colon cancer in a maternal grandmother.   GYNECOLOGIC HISTORY:  No LMP recorded. Patient has had a hysterectomy. Menarche: 65 years old Age at first live birth: 65 years old [place] P 3 LMP 02/2000 Contraceptive: used from 52 HRT never used  Hysterectomy? Yes, 02/2000 BSO? 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 Husband [name] is a retired Radiographer, therapeutic. Son [name], age 16, is a Geophysicist/field seismologist and youth pastor in [place]. Daughter [name], age 49, is a high school principal in [place]. Daughter [name], age 45, is a hair stylist here in [place]. [name] has two grandchildren. She attends a [employer].    ADVANCED DIRECTIVES: In the absence of any documentation to the contrary, the patient's spouse is their HCPOA.    HEALTH MAINTENANCE: Social History   Tobacco Use   Smoking status: Former    Packs/day: 0.50    Years: 10.00    Pack years: 5.00    Types: Cigarettes    Quit date: 09/25/1981    Years since quitting: 39.4   Smokeless tobacco: Never  Vaping Use   Vaping Use:  Never used  Substance Use Topics   Alcohol use: Never   Drug use: Never     Colonoscopy: 2018 (Dr. Earlean Shawl)  PAP: 08/2020  Bone density: 08/2020   Allergies  Allergen Reactions   Glimepiride     Hypoglycemia     Current Outpatient Medications  Medication Sig Dispense Refill   ACCU-CHEK GUIDE test strip      acetaminophen (TYLENOL) 500 MG tablet Take 1,000 mg by mouth every 6 (six) hours as needed for moderate pain.     amitriptyline (ELAVIL) 25 MG tablet Take 25 mg by mouth at bedtime.     Blood Glucose Monitoring Suppl (ACCU-CHEK GUIDE ME) w/Device KIT       Cholecalciferol (DIALYVITE VITAMIN D 5000) 125 MCG (5000 UT) capsule Take 5,000 Units by mouth daily.     fluconazole (DIFLUCAN) 150 MG tablet Take 1 tablet (150 mg total) by mouth daily. 30 tablet 0   ibuprofen (ADVIL) 200 MG tablet Take 400 mg by mouth every 6 (six) hours as needed for moderate pain or headache.     Insulin Syringe-Needle U-100 (INSULIN SYRINGE .5CC/30GX5/16") 30G X 5/16" 0.5 ML MISC See admin instructions.     lidocaine-prilocaine (EMLA) cream Apply to affected area once (Patient taking differently: Apply 1 application topically. Apply to affected area once) 30 g 3   LORazepam (ATIVAN) 0.5 MG tablet Take 1 tablet (0.5 mg total) by mouth at bedtime as needed (Nausea or vomiting). 30 tablet 0   losartan (COZAAR) 25 MG tablet Take 25 mg by mouth daily.     metFORMIN (GLUCOPHAGE) 500 MG tablet Take 1,000 mg by mouth 2 (two) times daily.     Multiple Vitamin (MULTIVITAMIN WITH MINERALS) TABS tablet Take 1 tablet by mouth daily.     Probiotic Product (PROBIOTIC PO) Take 1 capsule by mouth daily.     prochlorperazine (COMPAZINE) 10 MG tablet Take 1 tablet (10 mg total) by mouth every 6 (six) hours as needed (Nausea or vomiting). 30 tablet 1   sertraline (ZOLOFT) 50 MG tablet Take 50 mg by mouth daily.     simvastatin (ZOCOR) 40 MG tablet Take 40 mg by mouth at bedtime.     TOUJEO SOLOSTAR 300 UNIT/ML Solostar Pen Inject 45 Units into the skin daily.     traMADol (ULTRAM) 50 MG tablet Take 1-2 tablets (50-100 mg total) by mouth every 6 (six) hours as needed for moderate pain. 30 tablet 0   No current facility-administered medications for this visit.    OBJECTIVE:   Vitals:   02/11/21 0836  BP: (!) 169/69  Pulse: 96  Resp: 18  Temp: (!) 97.4 F (36.3 C)  SpO2: 100%      Body mass index is 32.77 kg/m.   Wt Readings from Last 3 Encounters:  02/11/21 209 lb 3.2 oz (94.9 kg)  01/23/21 204 lb (92.5 kg)  01/21/21 204 lb 12.8 oz (92.9 kg)     ECOG FS:1 - Symptomatic but  completely ambulatory GENERAL: Patient is a well appearing female in no acute distress HEENT:  Sclerae anicteric.  Oropharynx clear and moist. No ulcerations or evidence of oropharyngeal candidiasis. Neck is supple.  NODES:  No cervical, supraclavicular, or axillary lymphadenopathy palpated.  BREAST EXAM: Deferred LUNGS:  Clear to auscultation bilaterally.  No wheezes or rhonchi. HEART:  Regular rate and rhythm.  Heart rate is 100 to auscultation.No murmur appreciated. ABDOMEN:  Soft, nontender.  Positive, normoactive bowel sounds. No organomegaly palpated. MSK:  No focal spinal tenderness  to palpation. Full range of motion bilaterally in the upper extremities. EXTREMITIES:  No peripheral edema.   SKIN:  Clear with no obvious rashes or skin changes. No nail dyscrasia. NEURO:  Nonfocal. Well oriented.  Appropriate affect.    LAB RESULTS:  CMP     Component Value Date/Time   NA 135 01/21/2021 0754   K 4.9 01/21/2021 0754   CL 99 01/21/2021 0754   CO2 22 01/21/2021 0754   GLUCOSE 288 (H) 01/21/2021 0754   BUN 14 01/21/2021 0754   CREATININE 0.85 01/21/2021 0754   CREATININE 0.76 12/31/2020 0750   CALCIUM 9.7 01/21/2021 0754   PROT 7.9 01/21/2021 0754   ALBUMIN 3.8 01/21/2021 0754   AST 22 01/21/2021 0754   AST 24 12/31/2020 0750   ALT 34 01/21/2021 0754   ALT 25 12/31/2020 0750   ALKPHOS 69 01/21/2021 0754   BILITOT 0.4 01/21/2021 0754   BILITOT 0.3 12/31/2020 0750   GFRNONAA >60 01/21/2021 0754   GFRNONAA >60 12/31/2020 0750    No results found for: TOTALPROTELP, ALBUMINELP, A1GS, A2GS, BETS, BETA2SER, GAMS, MSPIKE, SPEI  Lab Results  Component Value Date   WBC 7.0 02/11/2021   NEUTROABS 4.1 02/11/2021   HGB 10.9 (L) 02/11/2021   HCT 33.6 (L) 02/11/2021   MCV 90.3 02/11/2021   PLT 268 02/11/2021    No results found for: LABCA2  No components found for: [id number]  No results for input(s): INR in the last 168 hours.  No results found for: LABCA2  No results  found for: CHY850  No results found for: YDX412  No results found for: INO676  No results found for: CA2729  No components found for: HGQUANT  No results found for: CEA1 / No results found for: CEA1   No results found for: AFPTUMOR  No results found for: CHROMOGRNA  No results found for: KPAFRELGTCHN, LAMBDASER, KAPLAMBRATIO (kappa/lambda light chains)  No results found for: HGBA, HGBA2QUANT, HGBFQUANT, HGBSQUAN (Hemoglobinopathy evaluation)   No results found for: LDH  No results found for: IRON, TIBC, IRONPCTSAT (Iron and TIBC)  No results found for: FERRITIN  Urinalysis    Component Value Date/Time   COLORURINE AMBER (A) 01/06/2021 2145   APPEARANCEUR CLEAR 01/06/2021 2145   LABSPEC >1.046 (H) 01/06/2021 2145   PHURINE 5.0 01/06/2021 2145   GLUCOSEU 150 (A) 01/06/2021 2145   HGBUR NEGATIVE 01/06/2021 2145   [place] NEGATIVE 01/06/2021 2145   [place] NEGATIVE 01/06/2021 2145   PROTEINUR 30 (A) 01/06/2021 2145   NITRITE POSITIVE (A) 01/06/2021 2145   LEUKOCYTESUR TRACE (A) 01/06/2021 2145     STUDIES: CT CHEST WO CONTRAST  Result Date: 01/12/2021 CLINICAL DATA:  Incision and drainage of axillary fluid collection with continued redness. Rule out undrained fluid. EXAM: CT CHEST WITHOUT CONTRAST TECHNIQUE: Multidetector CT imaging of the chest was performed following the standard protocol without IV contrast. COMPARISON:  01/06/2021 FINDINGS: Cardiovascular: Heart size is normal. Coronary artery calcification is present. Aortic atherosclerotic calcification is present. Central line in place with the tip in the SVC. Mediastinum/Nodes: No mediastinal or hilar mass or adenopathy. Lungs/Pleura: The lungs are clear.  No pleural effusion. Upper Abdomen: Previous cholecystectomy.  No acute finding. Musculoskeletal: Status post incision and debridement of a fluid collection in the right axilla/superolateral breast. Air is present centrally within the region. There is no  evidence of an undrained component or loculation. IMPRESSION: Status post incision and debridement of a right axillary/superolateral breast abscess. No evidence of undrained component or loculation. Aortic  Atherosclerosis (ICD10-I70.0). coronary artery calcification. Electronically Signed   By: Nelson Chimes M.D.   On: 01/12/2021 14:47     ELIGIBLE FOR AVAILABLE RESEARCH PROTOCOL: no  ASSESSMENT: 65 y.o. [place] woman status post right breast upper inner quadrant biopsy 10/04/2020 for clinical T1b N0, stage IA invasive lobular carcinoma, E-cadherin negative, grade 1, estrogen receptor positive, progesterone receptor and HER2 negative, with an MIB-1 of 10%  (1) status post right lumpectomy and sentinel lymph node sampling 11/05/2020 for a PT1b pN0, stage IA invasive lobular carcinoma, grade 2  (2) Oncotype score of 32 predicts a risk of recurrence outside the breast within the next 9 years of 20% if the patient's only systemic therapy is antiestrogens for 5 years.  It also predicts significant benefit from chemotherapy.  (3) adjuvant chemotherapy with cyclophosphamide and docetaxel every 21 days x 4 to start 12/31/2020  (4) adjuvant radiation to follow  (5) antiestrogens to start at the completion of local treatment.   PLAN: Dalicia is here to receive her third cycle of docetaxel and cyclophosphamide.  She will proceed with this treatment today.  I reviewed her CBC with her which is stable.  So long as her c-Met is within treatment parameters she will proceed with therapy.  We talked about her nasal congestion.  It is quite possible that the Sudafed extended release is keeping her up at night.  It also can elevate her blood pressure.  I would recommend that she change to an immediate release Sudafed and take 1 tablet in the morning and 1 tablet in the afternoon.  That would give her a slightly reduced dosage, help with her congestion, and hopefully mitigate her sleep issues.  I do not  recommend that she takes tramadol for sleep.  I suggested she try lorazepam on the nights that she has the most difficulty.  I did send in a refill #30 to her pharmacy.  Walterine will return tomorrow for IV fluids, and Friday for IV fluids.  I have put these request in her schedule message.  We will see her back in 3 weeks for her final cycle of chemotherapy.  She knows to call for any questions or concerns that may arise between now and her next appointment.   Total encounter time 30 minutes*in face-to-face visit time, chart review, lab review, care coordination, order entry, and documentation of the encounter.  Kimberly Bihari, NP 02/11/21 8:37 AM Medical Oncology and Hematology [facility] ([facility] [place], [place] [address] Tel. [phone]    Fax. [phone]    *Total Encounter Time as defined by the Centers for Medicare and Medicaid Services includes, in addition to the face-to-face time of a patient visit (documented in the note above) non-face-to-face time: obtaining and reviewing outside history, ordering and reviewing medications, tests or procedures, care coordination (communications with other health care professionals or caregivers) and documentation in the medical record.

## 2021-02-12 ENCOUNTER — Inpatient Hospital Stay (HOSPITAL_BASED_OUTPATIENT_CLINIC_OR_DEPARTMENT_OTHER): Payer: Medicare HMO

## 2021-02-12 ENCOUNTER — Ambulatory Visit: Payer: Medicare HMO

## 2021-02-12 ENCOUNTER — Other Ambulatory Visit: Payer: Self-pay | Admitting: *Deleted

## 2021-02-12 VITALS — BP 160/88 | HR 89 | Temp 97.7°F | Resp 18 | Ht 67.0 in | Wt 210.4 lb

## 2021-02-12 DIAGNOSIS — C50211 Malignant neoplasm of upper-inner quadrant of right female breast: Secondary | ICD-10-CM

## 2021-02-12 DIAGNOSIS — Z5189 Encounter for other specified aftercare: Secondary | ICD-10-CM | POA: Diagnosis not present

## 2021-02-12 DIAGNOSIS — Z170 Estrogen receptor positive status [ER+]: Secondary | ICD-10-CM

## 2021-02-12 DIAGNOSIS — Z5111 Encounter for antineoplastic chemotherapy: Secondary | ICD-10-CM | POA: Diagnosis not present

## 2021-02-12 DIAGNOSIS — Z95828 Presence of other vascular implants and grafts: Secondary | ICD-10-CM | POA: Diagnosis not present

## 2021-02-12 MED ORDER — SODIUM CHLORIDE 0.9 % IV SOLN: Freq: Once | INTRAVENOUS | Status: AC

## 2021-02-12 MED ORDER — PEGFILGRASTIM-CBQV 6 MG/0.6ML ~~LOC~~ SOSY: 6.0000 mg | PREFILLED_SYRINGE | Freq: Once | SUBCUTANEOUS | Status: DC

## 2021-02-12 MED ORDER — HEPARIN SOD (PORK) LOCK FLUSH 100 UNIT/ML IV SOLN
500.0000 [IU] | Freq: Once | INTRAVENOUS | Status: AC
  Administered 2021-02-12: 500 [IU]

## 2021-02-12 MED ORDER — PEGFILGRASTIM-CBQV 6 MG/0.6ML ~~LOC~~ SOSY
6.0000 mg | PREFILLED_SYRINGE | Freq: Once | SUBCUTANEOUS | Status: AC
  Administered 2021-02-12: 6 mg via SUBCUTANEOUS
  Filled 2021-02-12: qty 0.6

## 2021-02-12 NOTE — Progress Notes (Signed)
Pt tolerated fluids well. Injection was given. Advised pt to call facility with concerns. Pt verbalized understanding.

## 2021-02-14 ENCOUNTER — Other Ambulatory Visit: Payer: Self-pay

## 2021-02-14 ENCOUNTER — Inpatient Hospital Stay: Payer: Medicare HMO

## 2021-02-14 VITALS — BP 150/83 | HR 93 | Temp 98.9°F | Resp 18 | Ht 67.0 in | Wt 208.2 lb

## 2021-02-14 DIAGNOSIS — C50211 Malignant neoplasm of upper-inner quadrant of right female breast: Secondary | ICD-10-CM

## 2021-02-14 DIAGNOSIS — Z170 Estrogen receptor positive status [ER+]: Secondary | ICD-10-CM | POA: Diagnosis not present

## 2021-02-14 DIAGNOSIS — Z5189 Encounter for other specified aftercare: Secondary | ICD-10-CM | POA: Diagnosis not present

## 2021-02-14 DIAGNOSIS — Z5111 Encounter for antineoplastic chemotherapy: Secondary | ICD-10-CM | POA: Diagnosis not present

## 2021-02-14 DIAGNOSIS — Z95828 Presence of other vascular implants and grafts: Secondary | ICD-10-CM

## 2021-02-14 MED ORDER — SODIUM CHLORIDE 0.9 % IV SOLN
Freq: Once | INTRAVENOUS | Status: AC
Start: 2021-02-14 — End: 2021-02-14

## 2021-02-14 NOTE — Patient Instructions (Signed)
Dehydration, Adult Dehydration is condition in which there is not enough water or other fluids in the body. This happens when a person loses more fluids than he or she takes in. Important body parts cannot work right without the right amount of fluids. Any loss of fluids from the body can cause dehydration. Dehydration can be mild, worse, or very bad. It should be treated right away to keep it from getting very bad. What are the causes? This condition may be caused by: Conditions that cause loss of water or other fluids, such as: Watery poop (diarrhea). Vomiting. Sweating a lot. Peeing (urinating) a lot. Not drinking enough fluids, especially when you: Are ill. Are doing things that take a lot of energy to do. Other illnesses and conditions, such as fever or infection. Certain medicines, such as medicines that take extra fluid out of the body (diuretics). Lack of safe drinking water. Not being able to get enough water and food. What increases the risk? The following factors may make you more likely to develop this condition: Having a long-term (chronic) illness that has not been treated the right way, such as: Diabetes. Heart disease. Kidney disease. Being 74 years of age or older. Having a disability. Living in a place that is high above the ground or sea (high in altitude). The thinner, dried air causes more fluid loss. Doing exercises that put stress on your body for a long time. What are the signs or symptoms? Symptoms of dehydration depend on how bad it is. Mild or worse dehydration Thirst. Dry lips or dry mouth. Feeling dizzy or light-headed, especially when you stand up from sitting. Muscle cramps. Your body making: Dark pee (urine). Pee may be the color of tea. Less pee than normal. Less tears than normal. Headache. Very bad dehydration Changes in skin. Skin may: Be cold to the touch (clammy). Be blotchy or pale. Not go back to normal right after you lightly pinch  it and let it go. Little or no tears, pee, or sweat. Changes in vital signs, such as: Fast breathing. Low blood pressure. Weak pulse. Pulse that is more than 100 beats a minute when you are sitting still. Other changes, such as: Feeling very thirsty. Eyes that look hollow (sunken). Cold hands and feet. Being mixed up (confused). Being very tired (lethargic) or having trouble waking from sleep. Short-term weight loss. Loss of consciousness. How is this treated? Treatment for this condition depends on how bad it is. Treatment should start right away. Do not wait until your condition gets very bad. Very bad dehydration is an emergency. You will need to go to a hospital. Mild or worse dehydration can be treated at home. You may be asked to: Drink more fluids. Drink an oral rehydration solution (ORS). This drink helps get the right amounts of fluids and salts and minerals in the blood (electrolytes). Very bad dehydration can be treated: With fluids through an IV tube. By getting normal levels of salts and minerals in your blood. This is often done by giving salts and minerals through a tube. The tube is passed through your nose and into your stomach. By treating the root cause. Follow these instructions at home: Oral rehydration solution If told by your doctor, drink an ORS: Make an ORS. Use instructions on the package. Start by drinking small amounts, about  cup (120 mL) every 5-10 minutes. Slowly drink more until you have had the amount that your doctor said to have. Eating and drinking  Drink enough clear fluid to keep your pee pale yellow. If you were told to drink an ORS, finish the ORS first. Then, start slowly drinking other clear fluids. Drink fluids such as: Water. Do not drink only water. Doing that can make the salt (sodium) level in your body get too low. Water from ice chips you suck on. Fruit juice that you have added water to (diluted). Low-calorie sports  drinks. Eat foods that have the right amounts of salts and minerals, such as: Bananas. Oranges. Potatoes. Tomatoes. Spinach. Do not drink alcohol. Avoid: Drinks that have a lot of sugar. These include: High-calorie sports drinks. Fruit juice that you did not add water to. Soda. Caffeine. Foods that are greasy or have a lot of fat or sugar. General instructions Take over-the-counter and prescription medicines only as told by your doctor. Do not take salt tablets. Doing that can make the salt level in your body get too high. Return to your normal activities as told by your doctor. Ask your doctor what activities are safe for you. Keep all follow-up visits as told by your doctor. This is important. Contact a doctor if: You have pain in your belly (abdomen) and the pain: Gets worse. Stays in one place. You have a rash. You have a stiff neck. You get angry or annoyed (irritable) more easily than normal. You are more tired or have a harder time waking than normal. You feel: Weak or dizzy. Very thirsty. Get help right away if you have: Any symptoms of very bad dehydration. Symptoms of vomiting, such as: You cannot eat or drink without vomiting. Your vomiting gets worse or does not go away. Your vomit has blood or green stuff in it. Symptoms that get worse with treatment. A fever. A very bad headache. Problems with peeing or pooping (having a bowel movement), such as: Watery poop that gets worse or does not go away. Blood in your poop (stool). This may cause poop to look black and tarry. Not peeing in 6-8 hours. Peeing only a small amount of very dark pee in 6-8 hours. Trouble breathing. These symptoms may be an emergency. Do not wait to see if the symptoms will go away. Get medical help right away. Call your local emergency services (911 in the [place] Do not drive yourself to the hospital. Summary Dehydration is a condition in which there is not enough water or other fluids  in the body. This happens when a person loses more fluids than he or she takes in. Treatment for this condition depends on how bad it is. Treatment should be started right away. Do not wait until your condition gets very bad. Drink enough clear fluid to keep your pee pale yellow. If you were told to drink an oral rehydration solution (ORS), finish the ORS first. Then, start slowly drinking other clear fluids. Take over-the-counter and prescription medicines only as told by your doctor. Get help right away if you have any symptoms of very bad dehydration. This information is not intended to replace advice given to you by your health care provider. Make sure you discuss any questions you have with your health care provider. Document Revised: 10/20/2018 Document Reviewed: 10/20/2018 Elsevier Patient Education  [place]. Rehydration, Adult Rehydration is the replacement of body fluids, salts, and minerals (electrolytes) that are lost during dehydration. Dehydration is when there is not enough water or other fluids in the body. This happens when you lose more fluids than you take in. Common causes of  dehydration include: Not drinking enough fluids. This can occur when you are ill or doing activities that require a lot of energy, especially in hot weather. Conditions that cause loss of water or other fluids, such as diarrhea, vomiting, sweating, or urinating a lot. Other illnesses, such as fever or infection. Certain medicines, such as those that remove excess fluid from the body (diuretics). Symptoms of mild or moderate dehydration may include thirst, dry lips and mouth, and dizziness. Symptoms of severe dehydration may include increased heart rate, confusion, fainting, and not urinating. For severe dehydration, you may need to get fluids through an IV at the hospital. For mild or moderate dehydration, you can usually rehydrate at home by drinking certain fluids as told by your health care  provider. What are the risks? Generally, rehydration is safe. However, taking in too much fluid (overhydration) can be a problem. This is rare. Overhydration can cause an electrolyte imbalance, kidney failure, or a decrease in salt (sodium) levels in the body. Supplies needed You will need an oral rehydration solution (ORS) if your health care provider tells you to use one. This is a drink to treat dehydration. It can be found in pharmacies and retail stores. How to rehydrate Fluids Follow instructions from your health care provider for rehydration. The kind of fluid and the amount you should drink depend on your condition. In general, you should choose drinks that you prefer. If told by your health care provider, drink an ORS. Make an ORS by following instructions on the package. Start by drinking small amounts, about  cup (120 mL) every 5-10 minutes. Slowly increase how much you drink until you have taken the amount recommended by your health care provider. Drink enough clear fluids to keep your urine pale yellow. If you were told to drink an ORS, finish it first, then start slowly drinking other clear fluids. Drink fluids such as: Water. This includes sparkling water and flavored water. Drinking only water can lead to having too little sodium in your body (hyponatremia). Follow the advice of your health care provider. Water from ice chips you suck on. Fruit juice with water you add to it (diluted). Sports drinks. Hot or cold herbal teas. Broth-based soups. Milk or milk products. Food Follow instructions from your health care provider about what to eat while you rehydrate. Your health care provider may recommend that you slowly begin eating regular foods in small amounts. Eat foods that contain a healthy balance of electrolytes, such as bananas, oranges, potatoes, tomatoes, and spinach. Avoid foods that are greasy or contain a lot of sugar. In some cases, you may get nutrition through a  feeding tube that is passed through your nose and into your stomach (nasogastric tube, or NG tube). This may be done if you have uncontrolled vomiting or diarrhea. Beverages to avoid Certain beverages may make dehydration worse. While you rehydrate, avoid drinking alcohol. How to tell if you are recovering from dehydration You may be recovering from dehydration if: You are urinating more often than before you started rehydrating. Your urine is pale yellow. Your energy level improves. You vomit less frequently. You have diarrhea less frequently. Your appetite improves or returns to normal. You feel less dizzy or less light-headed. Your skin tone and color start to look more normal. Follow these instructions at home: Take over-the-counter and prescription medicines only as told by your health care provider. Do not take sodium tablets. Doing this can lead to having too much sodium in your body (hypernatremia).  Contact a health care provider if: You continue to have symptoms of mild or moderate dehydration, such as: Thirst. Dry lips. Slightly dry mouth. Dizziness. Dark urine or less urine than normal. Muscle cramps. You continue to vomit or have diarrhea. Get help right away if you: Have symptoms of dehydration that get worse. Have a fever. Have a severe headache. Have been vomiting and the following happens: Your vomiting gets worse or does not go away. Your vomit includes blood or green matter (bile). You cannot eat or drink without vomiting. Have problems with urination or bowel movements, such as: Diarrhea that gets worse or does not go away. Blood in your stool (feces). This may cause stool to look black and tarry. Not urinating, or urinating only a small amount of very dark urine, within 6-8 hours. Have trouble breathing. Have symptoms that get worse with treatment. These symptoms may represent a serious problem that is an emergency. Do not wait to see if the symptoms will go  away. Get medical help right away. Call your local emergency services (911 in the [place] Do not drive yourself to the hospital. Summary Rehydration is the replacement of body fluids and minerals (electrolytes) that are lost during dehydration. Follow instructions from your health care provider for rehydration. The kind of fluid and amount you should drink depend on your condition. Slowly increase how much you drink until you have taken the amount recommended by your health care provider. Contact your health care provider if you continue to show signs of mild or moderate dehydration. This information is not intended to replace advice given to you by your health care provider. Make sure you discuss any questions you have with your health care provider. Document Revised: 05/10/2019 Document Reviewed: 03/20/2019 Elsevier Patient Education  2022 [employer].

## 2021-02-14 NOTE — Progress Notes (Signed)
Pt tolerated fluids well. Advised to call office with concerns. Pt verbalized understanding.

## 2021-02-21 DIAGNOSIS — E7800 Pure hypercholesterolemia, unspecified: Secondary | ICD-10-CM | POA: Diagnosis not present

## 2021-02-21 DIAGNOSIS — G629 Polyneuropathy, unspecified: Secondary | ICD-10-CM | POA: Diagnosis not present

## 2021-02-21 DIAGNOSIS — E1165 Type 2 diabetes mellitus with hyperglycemia: Secondary | ICD-10-CM | POA: Diagnosis not present

## 2021-02-21 DIAGNOSIS — N200 Calculus of kidney: Secondary | ICD-10-CM | POA: Diagnosis not present

## 2021-02-21 DIAGNOSIS — I10 Essential (primary) hypertension: Secondary | ICD-10-CM | POA: Diagnosis not present

## 2021-02-24 ENCOUNTER — Ambulatory Visit: Payer: Medicare HMO | Attending: General Surgery | Admitting: Physical Therapy

## 2021-02-24 ENCOUNTER — Other Ambulatory Visit: Payer: Self-pay

## 2021-02-24 DIAGNOSIS — Z483 Aftercare following surgery for neoplasm: Secondary | ICD-10-CM | POA: Insufficient documentation

## 2021-02-26 ENCOUNTER — Encounter: Payer: Self-pay | Admitting: Physical Therapy

## 2021-02-26 NOTE — Therapy (Signed)
[facility] @ [address] [place], [place], [address] Phone: [phone]   Fax:  [phone]  Physical Therapy Treatment  Patient Details  Name: [name] MRN: [id number] Date of Birth: 1955/04/24 Referring Provider (PT): Dr. Rolm Bookbinder   Encounter Date: 02/24/2021   PT End of Session - 02/26/21 1317     Visit Number 3    Number of Visits 10    PT Start Time 5726    PT Stop Time 2035    PT Time Calculation (min) 10 min    Activity Tolerance Patient tolerated treatment well    Behavior During Therapy [facility] for tasks assessed/performed             Past Medical History:  Diagnosis Date   Cancer ([place])    Cataract    removed   Diabetes mellitus without complication ([place])    History of kidney stones    Hyperlipidemia    Hypertension    PONV (postoperative nausea and vomiting)     Past Surgical History:  Procedure Laterality Date   ABDOMINAL HYSTERECTOMY     BREAST LUMPECTOMY Right 01/09/2021   Procedure: incision drainage right breast abscess;  Surgeon: Rolm Bookbinder, MD;  Location: [facility];  Service: General;  Laterality: Right;   BREAST LUMPECTOMY WITH RADIOACTIVE SEED AND SENTINEL LYMPH NODE BIOPSY Right 11/05/2020   Procedure: RIGHT BREAST LUMPECTOMY WITH RADIOACTIVE SEED AND RIGHT AXILLARY SENTINEL LYMPH NODE BIOPSY;  Surgeon: Rolm Bookbinder, MD;  Location: [facility];  Service: General;  Laterality: Right;   CHOLECYSTECTOMY     KNEE SURGERY     PORTACATH PLACEMENT Right 12/30/2020   Procedure: INSERTION PORT-A-CATH;  Surgeon: Rolm Bookbinder, MD;  Location: [facility];  Service: General;  Laterality: Right;    There were no vitals filed for this visit.   Subjective Assessment - 02/26/21 1316     Subjective Patient is here today for her SOZO screen                    L-DEX FLOWSHEETS - 02/26/21 1300       L-DEX LYMPHEDEMA SCREENING   Measurement Type  Unilateral    L-DEX MEASUREMENT EXTREMITY Upper Extremity    POSITION  Standing    DOMINANT SIDE Right    At Risk Side Right    BASELINE SCORE (UNILATERAL) 0.7    L-DEX SCORE (UNILATERAL) -3.7    VALUE CHANGE (UNILAT) -4.4                                     PT Long Term Goals - 12/17/20 1009       PT LONG TERM GOAL #1   Title Patient will demonstrate she has regained full shoulder ROM and function post operatively compared to baselines.    Status Achieved      PT LONG TERM GOAL #2   Title Patient will increase right shoulder active flexion to >/= 150 degrees for increased ease reaching overhead.    Status Achieved      PT LONG TERM GOAL #3   Title Patient will increase right shoulder active abduction to >/= 160 degrees for ability to obtain radiation positioning with ease.    Status Achieved      PT LONG TERM GOAL #4   Title Patient will verbalize good understanding of lymphedema risk reduction practices.  Status Achieved      PT LONG TERM GOAL #5   Title Patient will improve her DASH score to </= 8 for improved overall upper extremity function.    Baseline down to 9%    Status Partially Met                   Plan - 02/26/21 1317     Clinical Impression Statement Patient has not increased on her SOZO screen so no signs of lymphedema at this time.    PT Treatment/Interventions Spinal Manipulations    PT Next Visit Plan Continue SOZO screens every 3 months    Consulted and Agree with Plan of Care Patient             Patient will benefit from skilled therapeutic intervention in order to improve the following deficits and impairments:     Visit Diagnosis: Aftercare following surgery for neoplasm     Problem List Patient Active Problem List   Diagnosis Date Noted   Port-A-Cath in place 01/23/2021   Uncontrolled type 2 diabetes mellitus with hyperglycemia ([place]) 01/07/2021   SIRS (systemic inflammatory response syndrome)  ([place]) 01/06/2021   Malignant neoplasm of upper-inner quadrant of right breast in female, estrogen receptor positive ([place]) 10/14/2020   Annia Friendly, PT 02/26/21 1:18 PM   [facility] @ [address] [place], [place], [address] Phone: [phone]   Fax:  [phone]  Name: LAURAMAE KNEISLEY MRN: [id number] Date of Birth: 12/07/1955

## 2021-03-03 ENCOUNTER — Inpatient Hospital Stay: Payer: Medicare HMO | Attending: Oncology | Admitting: Oncology

## 2021-03-03 ENCOUNTER — Inpatient Hospital Stay: Payer: Medicare HMO

## 2021-03-03 ENCOUNTER — Encounter: Payer: Self-pay | Admitting: *Deleted

## 2021-03-03 ENCOUNTER — Other Ambulatory Visit: Payer: Self-pay

## 2021-03-03 VITALS — BP 153/63 | HR 95 | Temp 97.6°F

## 2021-03-03 VITALS — BP 150/64 | HR 97 | Temp 98.6°F | Resp 18 | Wt 208.0 lb

## 2021-03-03 DIAGNOSIS — Z170 Estrogen receptor positive status [ER+]: Secondary | ICD-10-CM

## 2021-03-03 DIAGNOSIS — C50211 Malignant neoplasm of upper-inner quadrant of right female breast: Secondary | ICD-10-CM | POA: Insufficient documentation

## 2021-03-03 DIAGNOSIS — Z95828 Presence of other vascular implants and grafts: Secondary | ICD-10-CM

## 2021-03-03 DIAGNOSIS — Z5111 Encounter for antineoplastic chemotherapy: Secondary | ICD-10-CM | POA: Insufficient documentation

## 2021-03-03 DIAGNOSIS — R110 Nausea: Secondary | ICD-10-CM | POA: Insufficient documentation

## 2021-03-03 DIAGNOSIS — Z5189 Encounter for other specified aftercare: Secondary | ICD-10-CM | POA: Insufficient documentation

## 2021-03-03 LAB — CBC WITH DIFFERENTIAL/PLATELET
Abs Immature Granulocytes: 0.02 10*3/uL (ref 0.00–0.07)
Basophils Absolute: 0.2 10*3/uL — ABNORMAL HIGH (ref 0.0–0.1)
Basophils Relative: 3 %
Eosinophils Absolute: 0 10*3/uL (ref 0.0–0.5)
Eosinophils Relative: 1 %
HCT: 34.1 % — ABNORMAL LOW (ref 36.0–46.0)
Hemoglobin: 11 g/dL — ABNORMAL LOW (ref 12.0–15.0)
Immature Granulocytes: 0 %
Lymphocytes Relative: 33 %
Lymphs Abs: 2 10*3/uL (ref 0.7–4.0)
MCH: 29.1 pg (ref 26.0–34.0)
MCHC: 32.3 g/dL (ref 30.0–36.0)
MCV: 90.2 fL (ref 80.0–100.0)
Monocytes Absolute: 0.6 10*3/uL (ref 0.1–1.0)
Monocytes Relative: 11 %
Neutro Abs: 3.2 10*3/uL (ref 1.7–7.7)
Neutrophils Relative %: 52 %
Platelets: 242 10*3/uL (ref 150–400)
RBC: 3.78 MIL/uL — ABNORMAL LOW (ref 3.87–5.11)
RDW: 16.5 % — ABNORMAL HIGH (ref 11.5–15.5)
WBC: 6 10*3/uL (ref 4.0–10.5)
nRBC: 0 % (ref 0.0–0.2)

## 2021-03-03 LAB — COMPREHENSIVE METABOLIC PANEL
ALT: 19 U/L (ref 0–44)
AST: 19 U/L (ref 15–41)
Albumin: 3.7 g/dL (ref 3.5–5.0)
Alkaline Phosphatase: 67 U/L (ref 38–126)
Anion gap: 12 (ref 5–15)
BUN: 10 mg/dL (ref 8–23)
CO2: 22 mmol/L (ref 22–32)
Calcium: 8.6 mg/dL — ABNORMAL LOW (ref 8.9–10.3)
Chloride: 106 mmol/L (ref 98–111)
Creatinine, Ser: 0.74 mg/dL (ref 0.44–1.00)
GFR, Estimated: >60 mL/min (ref >60)
Glucose, Bld: 188 mg/dL — ABNORMAL HIGH (ref 70–99)
Potassium: 4.2 mmol/L (ref 3.5–5.1)
Sodium: 140 mmol/L (ref 135–145)
Total Bilirubin: 0.4 mg/dL (ref 0.3–1.2)
Total Protein: 6.9 g/dL (ref 6.5–8.1)

## 2021-03-03 MED ORDER — DEXAMETHASONE SODIUM PHOSPHATE 10 MG/ML IJ SOLN
4.0000 mg | Freq: Once | INTRAMUSCULAR | Status: AC
  Administered 2021-03-03: 4 mg via INTRAVENOUS
  Filled 2021-03-03: qty 1

## 2021-03-03 MED ORDER — SODIUM CHLORIDE 0.9 % IV SOLN
600.0000 mg/m2 | Freq: Once | INTRAVENOUS | Status: AC
  Administered 2021-03-03: 1240 mg via INTRAVENOUS
  Filled 2021-03-03: qty 62

## 2021-03-03 MED ORDER — SODIUM CHLORIDE 0.9% FLUSH
10.0000 mL | Freq: Once | INTRAVENOUS | Status: AC
  Administered 2021-03-03: 10 mL

## 2021-03-03 MED ORDER — SODIUM CHLORIDE 0.9 % IV SOLN: 4.0000 mg | Freq: Once | INTRAVENOUS | Status: DC

## 2021-03-03 MED ORDER — HEPARIN SOD (PORK) LOCK FLUSH 100 UNIT/ML IV SOLN
500.0000 [IU] | Freq: Once | INTRAVENOUS | Status: AC | PRN
  Administered 2021-03-03: 500 [IU]

## 2021-03-03 MED ORDER — PALONOSETRON HCL INJECTION 0.25 MG/5ML
0.2500 mg | Freq: Once | INTRAVENOUS | Status: AC
  Administered 2021-03-03: 0.25 mg via INTRAVENOUS
  Filled 2021-03-03: qty 5

## 2021-03-03 MED ORDER — SODIUM CHLORIDE 0.9 % IV SOLN
75.0000 mg/m2 | Freq: Once | INTRAVENOUS | Status: AC
  Administered 2021-03-03: 160 mg via INTRAVENOUS
  Filled 2021-03-03: qty 16

## 2021-03-03 MED ORDER — SODIUM CHLORIDE 0.9 % IV SOLN: Freq: Once | INTRAVENOUS | Status: AC

## 2021-03-03 MED ORDER — SODIUM CHLORIDE 0.9% FLUSH
10.0000 mL | INTRAVENOUS | Status: DC | PRN
  Administered 2021-03-03: 10 mL

## 2021-03-03 NOTE — Patient Instructions (Signed)
[facility]  Discharge Instructions: Thank you for choosing [facility] to provide your oncology and hematology care.   If you have a lab appointment with the Cancer Center, please go directly to the Cancer Center and check in at the registration area.   Wear comfortable clothing and clothing appropriate for easy access to any Portacath or PICC line.   We strive to give you quality time with your provider. You may need to reschedule your appointment if you arrive late (15 or more minutes).  Arriving late affects you and other patients whose appointments are after yours.  Also, if you miss three or more appointments without notifying the office, you may be dismissed from the clinic at the provider's discretion.      For prescription refill requests, have your pharmacy contact our office and allow 72 hours for refills to be completed.    Today you received the following chemotherapy and/or immunotherapy agents: Docetaxel (Taxotere) and Cyclophosphamide (Cytoxan)   To help prevent nausea and vomiting after your treatment, we encourage you to take your nausea medication as directed.  BELOW ARE SYMPTOMS THAT SHOULD BE REPORTED IMMEDIATELY: *FEVER GREATER THAN 100.4 F (38 C) OR HIGHER *CHILLS OR SWEATING *NAUSEA AND VOMITING THAT IS NOT CONTROLLED WITH YOUR NAUSEA MEDICATION *UNUSUAL SHORTNESS OF BREATH *UNUSUAL BRUISING OR BLEEDING *URINARY PROBLEMS (pain or burning when urinating, or frequent urination) *BOWEL PROBLEMS (unusual diarrhea, constipation, pain near the anus) TENDERNESS IN MOUTH AND THROAT WITH OR WITHOUT PRESENCE OF ULCERS (sore throat, sores in mouth, or a toothache) UNUSUAL RASH, SWELLING OR PAIN  UNUSUAL VAGINAL DISCHARGE OR ITCHING   Items with * indicate a potential emergency and should be followed up as soon as possible or go to the Emergency Department if any problems should occur.  Please show the CHEMOTHERAPY ALERT CARD or  IMMUNOTHERAPY ALERT CARD at check-in to the Emergency Department and triage nurse.  Should you have questions after your visit or need to cancel or reschedule your appointment, please contact [facility]  Dept: [phone]  and follow the prompts.  Office hours are 8:00 a.m. to 4:30 p.m. Monday - Friday. Please note that voicemails left after 4:00 p.m. may not be returned until the following business day.  We are closed weekends and major holidays. You have access to a nurse at all times for urgent questions. Please call the main number to the clinic Dept: [phone] and follow the prompts.   For any non-urgent questions, you may also contact your provider using MyChart. We now offer e-Visits for anyone 18 and older to request care online for non-urgent symptoms. For details visit [email or web address].   Also download the MyChart app! Go to the app store, search "MyChart", open the app, select [facility], and log in with your MyChart username and password.  Due to Covid, a mask is required upon entering the hospital/clinic. If you do not have a mask, one will be given to you upon arrival. For doctor visits, patients may have 1 support person aged 18 or older with them. For treatment visits, patients cannot have anyone with them due to current Covid guidelines and our immunocompromised population.  

## 2021-03-03 NOTE — Progress Notes (Signed)
[place]  [phone] [phone]     ID: [name] DOB: October 28, 1955  MR#: [id number]  DYN#:[id number]  Patient Care Team: Antony Contras, MD as PCP - General (Family Medicine) Mauro Kaufmann, RN as Oncology Nurse Navigator Rockwell [place], RN as Oncology Nurse Navigator Rolm Bookbinder, MD as Consulting Physician (General Surgery) Elesia Pemberton, Virgie Dad, MD as Consulting Physician (Oncology) Eppie Gibson, MD as Attending Physician (Radiation Oncology) Arvella Nigh, MD as Consulting Physician (Obstetrics and Gynecology) Jacelyn Pi, MD as Referring Physician (Endocrinology) [name], MD OTHER MD:  CHIEF COMPLAINT: Estrogen receptor positive breast cancer  CURRENT TREATMENT: Adjuvant chemotherapy   INTERVAL HISTORY: [name] returns today for follow up and treatment of her estrogen receptor positive breast cancer.   She began adjuvant docetaxel and cyclophosphamide 12/31/2020. This will be given every 21 days x4. Today is cycle 4 day 1, her last day of treatment.  She receives Udenyca for WBC support on day 3 .  [name] received IV fluids on days 3 and 4 following her chemotherapy administration.  She notes that she felt much better after receiving the IV fluids and accordingly we are repeating this after the current cycle   REVIEW OF SYSTEMS: [name] has been able to keep some of her hair which is really extraordinary since she did not use a cold.  She has no peripheral neuropathy symptoms.  She is of course somewhat tired but has started a walking program.  She has problems with diarrhea alternating with constipation.  She has insomnia.  She has some discomfort in the quads bilaterally and they are not as strong as it used to be.  She has had minimal ankle swelling which comes and goes.  A detailed review of systems today was otherwise stable.    COVID 19 VACCINATION STATUS: Pfizer x3 as of July 2022   HISTORY OF CURRENT ILLNESS: From  the original intake note:  [name] had routine screening mammography on 09/04/2020 showing a possible abnormality in the right breast. She underwent right diagnostic mammography with tomography and right breast ultrasonography at [facility] on 09/30/2020 showing: breast density category B; 0.7 cm mass in right breast at 12:30; normal right axillary lymph nodes.  Accordingly on 10/04/2020 she proceeded to biopsy of the right breast area in question. The pathology from this procedure ([id number]) showed: invasive mammary carcinoma, e-cadherin negative, grade 1; atypical lobular hyperplasia. Prognostic indicators significant for: estrogen receptor, 90% positive with moderate staining intensity and progesterone receptor, 0% negative. Proliferation marker Ki67 at 10%. HER2 negative by immunohistochemistry (1+).   Cancer Staging  Malignant neoplasm of upper-inner quadrant of right breast in female, estrogen receptor positive ([place]) Staging form: Breast, AJCC 8th Edition - Clinical stage from 10/16/2020: Stage IA (cT1b, cN0, cM0, G1, ER+, PR-, HER2-) - Signed by [name], MD on 10/16/2020 Stage prefix: Initial diagnosis Histologic grading system: 3 grade system Laterality: Right Staged by: Pathologist and managing physician Stage used in treatment planning: Yes National guidelines used in treatment planning: Yes Type of national guideline used in treatment planning: NCCN  The patient's subsequent history is as detailed below.   PAST MEDICAL HISTORY: Past Medical History:  Diagnosis Date   Cancer ([place])    Cataract    removed   Diabetes mellitus without complication ([place])    History of kidney stones    Hyperlipidemia    Hypertension    PONV (postoperative nausea and vomiting)     PAST SURGICAL  HISTORY: Past Surgical History:  Procedure Laterality Date   ABDOMINAL HYSTERECTOMY     BREAST LUMPECTOMY Right 01/09/2021   Procedure: incision drainage right breast abscess;   Surgeon: Rolm Bookbinder, MD;  Location: [facility];  Service: General;  Laterality: Right;   BREAST LUMPECTOMY WITH RADIOACTIVE SEED AND SENTINEL LYMPH NODE BIOPSY Right 11/05/2020   Procedure: RIGHT BREAST LUMPECTOMY WITH RADIOACTIVE SEED AND RIGHT AXILLARY SENTINEL LYMPH NODE BIOPSY;  Surgeon: Rolm Bookbinder, MD;  Location: [facility];  Service: General;  Laterality: Right;   CHOLECYSTECTOMY     KNEE SURGERY     PORTACATH PLACEMENT Right 12/30/2020   Procedure: INSERTION PORT-A-CATH;  Surgeon: Rolm Bookbinder, MD;  Location: [facility] [place];  Service: General;  Laterality: Right;    FAMILY HISTORY: Family History  Problem Relation Age of Onset   Heart attack Mother    Diabetes Father    Colon cancer Maternal Grandmother    Breast cancer Cousin        dx early 61's   Her father died at age 76 from diabetes complications. Her mother died at age 95 from MI. [name] has two brothers (and no sisters). She reports breast cancer in a maternal cousin in her early 73's and colon cancer in a maternal grandmother.   GYNECOLOGIC HISTORY:  No LMP recorded. Patient has had a hysterectomy. Menarche: 65 years old Age at first live birth: 65 years old [place] P 3 LMP 02/2000 Contraceptive: used from 23 HRT never used  Hysterectomy? Yes, 02/2000 BSO? 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 Husband [name] is a retired Radiographer, therapeutic. Son [name], age 33, is a Geophysicist/field seismologist and youth pastor in [place]. Daughter [name], age 67, is a high school principal in [place]. Daughter [name], age 69, is a hair stylist here in [place]. [name] has two grandchildren. She attends a [employer].    ADVANCED DIRECTIVES: In the absence of any documentation to the contrary, the patient's spouse is their HCPOA.    HEALTH MAINTENANCE: Social History   Tobacco  Use   Smoking status: Former    Packs/day: 0.50    Years: 10.00    Pack years: 5.00    Types: Cigarettes    Quit date: 09/25/1981    Years since quitting: 39.4   Smokeless tobacco: Never  Vaping Use   Vaping Use: Never used  Substance Use Topics   Alcohol use: Never   Drug use: Never     Colonoscopy: 2018 (Dr. Earlean Shawl)  PAP: 08/2020  Bone density: 08/2020   Allergies  Allergen Reactions   Glimepiride     Hypoglycemia     Current Outpatient Medications  Medication Sig Dispense Refill   ACCU-CHEK GUIDE test strip      acetaminophen (TYLENOL) 500 MG tablet Take 1,000 mg by mouth every 6 (six) hours as needed for moderate pain.     amitriptyline (ELAVIL) 25 MG tablet Take 25 mg by mouth at bedtime.     Blood Glucose Monitoring Suppl (ACCU-CHEK GUIDE ME) w/Device KIT      Cholecalciferol (DIALYVITE VITAMIN D 5000) 125 MCG (5000 UT) capsule Take 5,000 Units by mouth daily.     fluconazole (DIFLUCAN) 150 MG tablet Take 1 tablet (150 mg total) by mouth daily. 30 tablet 0   ibuprofen (ADVIL) 200 MG tablet Take 400 mg by mouth every 6 (  six) hours as needed for moderate pain or headache.     Insulin Syringe-Needle U-100 (INSULIN SYRINGE .5CC/30GX5/16") 30G X 5/16" 0.5 ML MISC See admin instructions.     lidocaine-prilocaine (EMLA) cream Apply to affected area once (Patient taking differently: Apply 1 application topically. Apply to affected area once) 30 g 3   LORazepam (ATIVAN) 0.5 MG tablet Take 1 tablet (0.5 mg total) by mouth at bedtime as needed (Nausea or vomiting). 30 tablet 0   losartan (COZAAR) 25 MG tablet Take 25 mg by mouth daily.     metFORMIN (GLUCOPHAGE) 500 MG tablet Take 1,000 mg by mouth 2 (two) times daily.     Multiple Vitamin (MULTIVITAMIN WITH MINERALS) TABS tablet Take 1 tablet by mouth daily.     Probiotic Product (PROBIOTIC PO) Take 1 capsule by mouth daily.     prochlorperazine (COMPAZINE) 10 MG tablet Take 1 tablet (10 mg total) by mouth every 6 (six) hours as  needed (Nausea or vomiting). 30 tablet 1   sertraline (ZOLOFT) 50 MG tablet Take 50 mg by mouth daily.     simvastatin (ZOCOR) 40 MG tablet Take 40 mg by mouth at bedtime.     TOUJEO SOLOSTAR 300 UNIT/ML Solostar Pen Inject 45 Units into the skin daily.     traMADol (ULTRAM) 50 MG tablet Take 1-2 tablets (50-100 mg total) by mouth every 6 (six) hours as needed for moderate pain. 30 tablet 0   No current facility-administered medications for this visit.    OBJECTIVE: White woman in no acute distress  Vitals:   03/03/21 1213  BP: (!) 153/63  Pulse: 95  Temp: 97.6 F (36.4 C)  SpO2: 98%      There is no height or weight on file to calculate BMI.   Wt Readings from Last 3 Encounters:  03/03/21 208 lb (94.3 kg)  02/14/21 208 lb 3.2 oz (94.4 kg)  02/12/21 210 lb 6.4 oz (95.4 kg)   Sclerae unicteric, EOMs intact Oropharynx clear and moist No cervical or supraclavicular adenopathy Lungs no rales or rhonchi Heart regular rate and rhythm Abd soft, nontender, positive bowel sounds MSK no focal spinal tenderness, no upper or lower extremity lymphedema Neuro: nonfocal, well oriented, appropriate affect Breasts: Deferred    LAB RESULTS:  CMP     Component Value Date/Time   NA 140 03/03/2021 0756   K 4.2 03/03/2021 0756   CL 106 03/03/2021 0756   CO2 22 03/03/2021 0756   GLUCOSE 188 (H) 03/03/2021 0756   BUN 10 03/03/2021 0756   CREATININE 0.74 03/03/2021 0756   CREATININE 0.76 12/31/2020 0750   CALCIUM 8.6 (L) 03/03/2021 0756   PROT 6.9 03/03/2021 0756   ALBUMIN 3.7 03/03/2021 0756   AST 19 03/03/2021 0756   AST 24 12/31/2020 0750   ALT 19 03/03/2021 0756   ALT 25 12/31/2020 0750   ALKPHOS 67 03/03/2021 0756   BILITOT 0.4 03/03/2021 0756   BILITOT 0.3 12/31/2020 0750   GFRNONAA >60 03/03/2021 0756   GFRNONAA >60 12/31/2020 0750    No results found for: TOTALPROTELP, ALBUMINELP, A1GS, A2GS, BETS, BETA2SER, GAMS, MSPIKE, SPEI  Lab Results  Component Value Date    WBC 6.0 03/03/2021   NEUTROABS 3.2 03/03/2021   HGB 11.0 (L) 03/03/2021   HCT 34.1 (L) 03/03/2021   MCV 90.2 03/03/2021   PLT 242 03/03/2021    No results found for: LABCA2  No components found for: OFBPZW258  No results for input(s): INR in the last 168  hours.  No results found for: LABCA2  No results found for: MEB583  No results found for: ENM076  No results found for: KGS811  No results found for: CA2729  No components found for: HGQUANT  No results found for: CEA1 / No results found for: CEA1   No results found for: AFPTUMOR  No results found for: CHROMOGRNA  No results found for: KPAFRELGTCHN, LAMBDASER, KAPLAMBRATIO (kappa/lambda light chains)  No results found for: HGBA, HGBA2QUANT, HGBFQUANT, HGBSQUAN (Hemoglobinopathy evaluation)   No results found for: LDH  No results found for: IRON, TIBC, IRONPCTSAT (Iron and TIBC)  No results found for: FERRITIN  Urinalysis    Component Value Date/Time   COLORURINE AMBER (A) 01/06/2021 2145   APPEARANCEUR CLEAR 01/06/2021 2145   LABSPEC >1.046 (H) 01/06/2021 2145   PHURINE 5.0 01/06/2021 2145   GLUCOSEU 150 (A) 01/06/2021 2145   HGBUR NEGATIVE 01/06/2021 2145   [place] NEGATIVE 01/06/2021 2145   [place] NEGATIVE 01/06/2021 2145   PROTEINUR 30 (A) 01/06/2021 2145   NITRITE POSITIVE (A) 01/06/2021 2145   LEUKOCYTESUR TRACE (A) 01/06/2021 2145     STUDIES: No results found.   ELIGIBLE FOR AVAILABLE RESEARCH PROTOCOL: no  ASSESSMENT: 65 y.o. [place] woman status post right breast upper inner quadrant biopsy 10/04/2020 for clinical T1b N0, stage IA invasive lobular carcinoma, E-cadherin negative, grade 1, estrogen receptor positive, progesterone receptor and HER2 negative, with an MIB-1 of 10%  (1) status post right lumpectomy and sentinel lymph node sampling 11/05/2020 for a PT1b pN0, stage IA invasive lobular carcinoma, grade 2  (2) Oncotype score of 32 predicts a risk of recurrence  outside the breast within the next 9 years of 20% if the patient's only systemic therapy is antiestrogens for 5 years.  It also predicts significant benefit from chemotherapy.  (3) adjuvant chemotherapy with cyclophosphamide and docetaxel every 21 days x 4 started 12/31/2020, completed 03/03/2021  (4) adjuvant radiation to follow  (5) antiestrogens to start at the completion of local treatment.   PLAN: [name] did remarkably well with her 4 cycles of chemotherapy and in particular she reports no symptoms of peripheral neuropathy.  She has had some fatigue but is trying to exercise more and that is exactly the right solution.  She will receive fluids over the next few days which we have found in her case as helped.  She understands when she starts radiation she will again develop some fatigue and again the way to overcome that is to be more active.  I expect she will be done with radiation towards the end of January so she will return to see [place] in February.  At that time we will discuss antiestrogen options.  She knows to call for any questions or concerns that may arise between now and her next appointment.  Total encounter time 20 minutes*  Wilber Bihari, NP 03/03/21 6:10 PM Medical Oncology and Hematology [facility] [place], [place] [address] Tel. [phone]    Fax. [phone]    *Total Encounter Time as defined by the Centers for Medicare and Medicaid Services includes, in addition to the face-to-face time of a patient visit (documented in the note above) non-face-to-face time: obtaining and reviewing outside history, ordering and reviewing medications, tests or procedures, care coordination (communications with other health care professionals or caregivers) and documentation in the medical record.

## 2021-03-04 ENCOUNTER — Inpatient Hospital Stay: Payer: Medicare HMO

## 2021-03-04 VITALS — BP 157/83 | HR 95 | Temp 98.7°F | Resp 17

## 2021-03-04 DIAGNOSIS — Z5189 Encounter for other specified aftercare: Secondary | ICD-10-CM | POA: Diagnosis not present

## 2021-03-04 DIAGNOSIS — R110 Nausea: Secondary | ICD-10-CM | POA: Diagnosis not present

## 2021-03-04 DIAGNOSIS — Z170 Estrogen receptor positive status [ER+]: Secondary | ICD-10-CM

## 2021-03-04 DIAGNOSIS — Z95828 Presence of other vascular implants and grafts: Secondary | ICD-10-CM

## 2021-03-04 DIAGNOSIS — C50211 Malignant neoplasm of upper-inner quadrant of right female breast: Secondary | ICD-10-CM | POA: Diagnosis not present

## 2021-03-04 DIAGNOSIS — Z5111 Encounter for antineoplastic chemotherapy: Secondary | ICD-10-CM | POA: Diagnosis not present

## 2021-03-04 MED ORDER — HEPARIN SOD (PORK) LOCK FLUSH 100 UNIT/ML IV SOLN
500.0000 [IU] | Freq: Once | INTRAVENOUS | Status: AC
Start: 2021-03-04 — End: 2021-03-04
  Administered 2021-03-04: 500 [IU]

## 2021-03-04 MED ORDER — SODIUM CHLORIDE 0.9 % IV SOLN
12.5000 mg | Freq: Once | INTRAVENOUS | Status: AC
  Administered 2021-03-04: 12.5 mg via INTRAVENOUS
  Filled 2021-03-04: qty 0.5

## 2021-03-04 MED ORDER — SODIUM CHLORIDE 0.9 % IV SOLN: Freq: Once | INTRAVENOUS | Status: AC

## 2021-03-04 MED ORDER — SODIUM CHLORIDE 0.9% FLUSH
10.0000 mL | Freq: Once | INTRAVENOUS | Status: AC
  Administered 2021-03-04: 10 mL

## 2021-03-04 NOTE — Progress Notes (Signed)
Received verbal order for phenergan from Dr. [name] due to constant nausea since chemotherapy the previous day.

## 2021-03-05 ENCOUNTER — Inpatient Hospital Stay: Payer: Medicare HMO

## 2021-03-05 ENCOUNTER — Other Ambulatory Visit: Payer: Self-pay

## 2021-03-05 VITALS — BP 143/56 | HR 107 | Temp 98.0°F | Resp 18

## 2021-03-05 DIAGNOSIS — C50211 Malignant neoplasm of upper-inner quadrant of right female breast: Secondary | ICD-10-CM

## 2021-03-05 DIAGNOSIS — Z5111 Encounter for antineoplastic chemotherapy: Secondary | ICD-10-CM | POA: Diagnosis not present

## 2021-03-05 DIAGNOSIS — Z170 Estrogen receptor positive status [ER+]: Secondary | ICD-10-CM

## 2021-03-05 DIAGNOSIS — Z95828 Presence of other vascular implants and grafts: Secondary | ICD-10-CM

## 2021-03-05 DIAGNOSIS — R110 Nausea: Secondary | ICD-10-CM | POA: Diagnosis not present

## 2021-03-05 DIAGNOSIS — Z5189 Encounter for other specified aftercare: Secondary | ICD-10-CM | POA: Diagnosis not present

## 2021-03-05 MED ORDER — SODIUM CHLORIDE 0.9% FLUSH
10.0000 mL | Freq: Once | INTRAVENOUS | Status: AC
  Administered 2021-03-05: 16:00:00 10 mL via INTRAVENOUS

## 2021-03-05 MED ORDER — HEPARIN SOD (PORK) LOCK FLUSH 100 UNIT/ML IV SOLN
500.0000 [IU] | Freq: Once | INTRAVENOUS | Status: AC
  Administered 2021-03-05: 16:00:00 500 [IU] via INTRAVENOUS

## 2021-03-05 MED ORDER — SODIUM CHLORIDE 0.9 % IV SOLN: Freq: Once | INTRAVENOUS | Status: AC

## 2021-03-05 MED ORDER — PEGFILGRASTIM-CBQV 6 MG/0.6ML ~~LOC~~ SOSY
6.0000 mg | PREFILLED_SYRINGE | Freq: Once | SUBCUTANEOUS | Status: AC
  Administered 2021-03-05: 15:00:00 6 mg via SUBCUTANEOUS
  Filled 2021-03-05: qty 0.6

## 2021-03-05 NOTE — Patient Instructions (Signed)

## 2021-03-06 ENCOUNTER — Inpatient Hospital Stay (HOSPITAL_BASED_OUTPATIENT_CLINIC_OR_DEPARTMENT_OTHER): Payer: Medicare HMO

## 2021-03-06 VITALS — BP 143/57 | HR 108 | Temp 99.0°F | Resp 18

## 2021-03-06 DIAGNOSIS — Z5189 Encounter for other specified aftercare: Secondary | ICD-10-CM | POA: Diagnosis not present

## 2021-03-06 DIAGNOSIS — R110 Nausea: Secondary | ICD-10-CM | POA: Diagnosis not present

## 2021-03-06 DIAGNOSIS — Z170 Estrogen receptor positive status [ER+]: Secondary | ICD-10-CM | POA: Diagnosis not present

## 2021-03-06 DIAGNOSIS — Z5111 Encounter for antineoplastic chemotherapy: Secondary | ICD-10-CM | POA: Diagnosis not present

## 2021-03-06 DIAGNOSIS — C50211 Malignant neoplasm of upper-inner quadrant of right female breast: Secondary | ICD-10-CM | POA: Diagnosis not present

## 2021-03-06 DIAGNOSIS — Z95828 Presence of other vascular implants and grafts: Secondary | ICD-10-CM

## 2021-03-06 MED ORDER — SODIUM CHLORIDE 0.9 % IV SOLN: Freq: Once | INTRAVENOUS | Status: AC

## 2021-03-06 MED ORDER — HEPARIN SOD (PORK) LOCK FLUSH 100 UNIT/ML IV SOLN
500.0000 [IU] | Freq: Once | INTRAVENOUS | Status: AC
  Administered 2021-03-06: 500 [IU]

## 2021-03-06 MED ORDER — SODIUM CHLORIDE 0.9% FLUSH
10.0000 mL | Freq: Once | INTRAVENOUS | Status: AC
  Administered 2021-03-06: 10 mL

## 2021-03-06 NOTE — Patient Instructions (Signed)

## 2021-03-13 NOTE — Progress Notes (Signed)
°Radiation Oncology         [phone] °________________________________ ° °Name: [name] MRN: [id number]  °Date: 03/14/2021  DOB: 06/18/1955 ° °Follow-Up Visit Note ° °Outpatient ° °CC: Swayne, David, MD  Magrinat, Gustav C, MD ° °Diagnosis:    °  ICD-10-CM   °1. Malignant neoplasm of upper-inner quadrant of right breast in female, estrogen receptor positive ([facility])  C50.211   ° Z17.0   °  °  ° Cancer Staging  °Malignant neoplasm of upper-inner quadrant of right breast in female, estrogen receptor positive ([facility]) °Staging form: Breast, AJCC 8th Edition °- Clinical stage from 10/16/2020: Stage IA (cT1b, cN0, cM0, G1, ER+, PR-, HER2-) - Signed by Magrinat, Gustav C, MD on 10/16/2020 °Stage prefix: Initial diagnosis °Histologic grading system: 3 grade system °Laterality: Right °Staged by: Pathologist and managing physician °Stage used in treatment planning: Yes °National guidelines used in treatment planning: Yes °Type of national guideline used in treatment planning: NCCN ° °pT1b, pN0  ° °CHIEF COMPLAINT: Here to discuss management of right breast cancer ° °Narrative:  The patient returns today for follow-up.   °  °Since breast clinic consultation date of 10/16/20, the patient opted to proceed with right breast lumpectomy and nodal biopsies on the date of 11/05/20. Pathology from the procedure revealed: tumor size of 0.8 cm; histology of grade 2 invasive lobular carcinoma; with resection margins are negative for carcinoma (right superior, medial, and anterior margin additional excisions negative for carcinoma). Nodal status of 2/2 right axillary sentinel lymph node excisions negative for carcinoma.  ER status: 90% positive with moderate staining intensity; PR status negative, Her2 status negative; proliferation marker Ki67 at 10%; Grade 2. ° °Oncotype DX was obtained on the final surgical sample and the recurrence score of 32 predicts a risk of recurrence outside the breast over the next 9 years of 20%, if the  patient's only systemic therapy is an antiestrogen for 5 years. It also predicts a significant benefit from chemotherapy. ° °Systemic therapy, if applicable, involved (dates and therapy as follows): the patient received 4 cycles of adjuvant chemotherapy consisting of docetaxel and cyclophosphamide on 12/31/20 through 03/03/21 under the care of Dr. Magrinat. Per Dr. Magrinat, the patient received IV fluids on days 3 and 4 following her chemotherapy administration.  She noted that she felt much better after receiving the IV fluids and was accordingly given IV fluids following her most recent cycle. On the day of her final infusion, the patient was noted to report chemo side effects such as tiredness, diarrhea, constipation, insomnia, bilateral LE muscle pain and weakness, and minimal ankle swelling that comes and goes. Otherwise, the patient was noted to toleration her 4 cycles of chemotherapy remarkably well. ° °In the midst of receiving systemic treatment, the patient presented to the ED on 01/06/21 with fever, chills, and generalized weakness. The patient reported some constipation, followed by loose stools and dysuria 3 days after her chemotherapy the prior week. The patient then developed fever, chills, LLQ abdominal pain, and weakness following these symptoms and presented to the ED. Following evaluation, the patient was admitted and treated for sepsis secondary to strep agalactiae abscess and diverticulitis. 40 ml of serosanguinous fluid was succesfully drained from the right axillary fluid collection on 01/07/21. While admitted, she completed a course of antibiotics for diverticulitis. She was discharged home on 01/13/21 with amoxicillin for her soft tissue infection.  ° °Imaging performed during her hospital encounter above is as follows:  °--CTA of the chest abdomen and   and pelvis on 01/06/21 revealed no evidence of PE. CTA also showed significant subcutaneous stranding surrounding the right chest port, with  some soft tissue air in the lower right neck. Findings were noted as possibly related to port placement; however, soft tissue infection could not be excluded. Additionally, 2 indeterminate right breast fluid collections were appreciated, the largest of which measuring up to 5.1.  --Chest CT on 01/12/21 showed no evidence of an undrained component or loculation.  SHE COMPLETED chemotherapy 03-05-21.  Symptomatically, the patient reports:  Lymphedema issues, if any:  Patient denies--was evaluated by PT earlier in the month (SOZO screening) and told she had "no signs of lymphedema at this time"  Pain issues, if any:  Patient denies   SAFETY ISSUES: Prior radiation? No Pacemaker/ICD? No Possible current pregnancy? No--hysterectomy Is the patient on methotrexate? No  Current Complaints / other details: recovering from nausea/fatigue        ALLERGIES:  is allergic to glimepiride.  Meds: Current Outpatient Medications  Medication Sig Dispense Refill   ACCU-CHEK GUIDE test strip      acetaminophen (TYLENOL) 500 MG tablet Take 1,000 mg by mouth every 6 (six) hours as needed for moderate pain.     amitriptyline (ELAVIL) 25 MG tablet Take 25 mg by mouth at bedtime.     Blood Glucose Monitoring Suppl (ACCU-CHEK GUIDE ME) w/Device KIT      Cholecalciferol (DIALYVITE VITAMIN D 5000) 125 MCG (5000 UT) capsule Take 5,000 Units by mouth daily.     fluconazole (DIFLUCAN) 150 MG tablet Take 1 tablet (150 mg total) by mouth daily. 30 tablet 0   ibuprofen (ADVIL) 200 MG tablet Take 400 mg by mouth every 6 (six) hours as needed for moderate pain or headache.     Insulin Syringe-Needle U-100 (INSULIN SYRINGE .5CC/30GX5/16") 30G X 5/16" 0.5 ML MISC See admin instructions.     lidocaine-prilocaine (EMLA) cream Apply to affected area once (Patient taking differently: Apply 1 application topically. Apply to affected area once) 30 g 3   LORazepam (ATIVAN) 0.5 MG tablet Take 1 tablet (0.5 mg total) by mouth at  bedtime as needed (Nausea or vomiting). 30 tablet 0   losartan (COZAAR) 25 MG tablet Take 25 mg by mouth daily.     metFORMIN (GLUCOPHAGE) 500 MG tablet Take 1,000 mg by mouth 2 (two) times daily.     Multiple Vitamin (MULTIVITAMIN WITH MINERALS) TABS tablet Take 1 tablet by mouth daily.     Probiotic Product (PROBIOTIC PO) Take 1 capsule by mouth daily.     prochlorperazine (COMPAZINE) 10 MG tablet Take 1 tablet (10 mg total) by mouth every 6 (six) hours as needed (Nausea or vomiting). 30 tablet 1   sertraline (ZOLOFT) 50 MG tablet Take 50 mg by mouth daily.     simvastatin (ZOCOR) 40 MG tablet Take 40 mg by mouth at bedtime.     TOUJEO SOLOSTAR 300 UNIT/ML Solostar Pen Inject 45 Units into the skin daily.     traMADol (ULTRAM) 50 MG tablet Take 1-2 tablets (50-100 mg total) by mouth every 6 (six) hours as needed for moderate pain. 30 tablet 0   No current facility-administered medications for this encounter.    Physical Findings:  height is 5' 7" (1.702 m) and weight is 210 lb 3.2 oz (95.3 kg). Her temperature is 97.9 F (36.6 C). Her blood pressure is 146/73 (abnormal) and her pulse is 102 (abnormal). Her respiration is 20 and oxygen saturation is 97%. Marland Kitchen  General: Alert and oriented, in no acute distress HEENT - alopecia Chest - right PAC in place Psychiatric: Judgment and insight are intact. Affect is appropriate. Breast exam reveals satisfactory healing of right breast surgical scars  Lab Findings: Lab Results  Component Value Date   WBC 6.0 03/03/2021   HGB 11.0 (L) 03/03/2021   HCT 34.1 (L) 03/03/2021   MCV 90.2 03/03/2021   PLT 242 03/03/2021      Radiographic Findings: No results found.  Impression/Plan: We discussed adjuvant radiotherapy today.  I recommend 4 wks of adjuvant RT to the right breast in order to reduce risk of locoregional recurrence by 2/3.  I reviewed the logistics, benefits, risks, and potential side effects of this treatment in detail. Risks may  include but not necessary be limited to acute and late injury tissue in the radiation fields such as skin irritation (change in color/pigmentation, itching, dryness, pain, peeling). She may experience fatigue. We also discussed possible risk of long term cosmetic changes or scar tissue. There is also a smaller risk for lung toxicity,  lymphedema, musculoskeletal changes, rib fragility or induction of a second malignancy, late chronic non-healing soft tissue wound.    The patient asked good questions which I answered to her satisfaction. She is enthusiastic about proceeding with treatment. A consent form has been signed and placed in her chart.  CT simulation today, start treatment in early Jan 2023.  On date of service, in total, I spent 30 minutes on this encounter. Patient was seen in person.  _____________________________________   Eppie Gibson, MD  This document serves as a record of services personally performed by Eppie Gibson, MD. It was created on her behalf by Roney Mans, a trained medical scribe. The creation of this record is based on the scribe's personal observations and the provider's statements to them. This document has been checked and approved by the attending provider.

## 2021-03-13 NOTE — Progress Notes (Signed)
Location of Breast Cancer:  Malignant neoplasm of upper-inner quadrant of right breast in female, estrogen receptor positive  Histology per Pathology Report:  11/05/2020 FINAL MICROSCOPIC DIAGNOSIS:  A. BREAST, RIGHT, LUMPECTOMY:  - Invasive lobular carcinoma, grade 2, 0.8 cm  - Resection margins are negative for carcinoma  - Biopsy site changes  - See oncology table  B. BREAST, RIGHT ADDITIONAL SUPERIOR MARGIN, EXCISION:  - Benign breast parenchyma, negative for carcinoma  C. BREAST, RIGHT ADDITIONAL MEDIAL MARGIN, EXCISION:  - Benign breast parenchyma, negative for carcinoma  D. BREAST, RIGHT ADDITIONAL ANTERIOR MARGIN, EXCISION:  - Benign breast parenchyma, negative for carcinoma  E. LYMPH NODE, RIGHT AXILLARY, SENTINEL, EXCISION:  - Lymph node, negative for carcinoma (0/1)  F. LYMPH NODE, RIGHT AXILLARY, SENTINEL, EXCISION:  - Lymph node, negative for carcinoma (0/1)   Receptor Status: ER(90%), PR (Negative), Her2-neu (Negative), Ki-67(10%)  Did patient present with symptoms (if so, please note symptoms) or was this found on screening mammography?: Patient had a routine screening mammography on 09/04/2020 showing a possible abnormality in the right breast. She underwent right diagnostic mammography with tomography and right breast ultrasonography at [facility] on 09/30/2020 showing: breast density category B; 0.7 cm mass in right breast at 12:30; normal right axillary lymph nodes  Past/Anticipated interventions by surgeon, if any:  11/05/2020 --Dr. Rolm Bookbinder 11/05/2020 1.  Right breast radioactive seed guided lumpectomy 2.  Right axillary sentinel lymph node biopsy, deep  Past/Anticipated interventions by medical oncology, if any:  Under care of Dr. Lurline Del 03/03/2021 Status post right lumpectomy and sentinel lymph node sampling 11/05/2020 for a PT1b pN0, stage IA invasive lobular carcinoma, grade 2 Oncotype score of 32 predicts a risk of recurrence  outside the breast within the next 9 years of 20% if the patient's only systemic therapy is antiestrogens for 5 years.  It also predicts significant benefit from chemotherapy. Adjuvant chemotherapy with cyclophosphamide and docetaxel every 21 days x 4 started 12/31/2020, completed 03/03/2021 Adjuvant radiation to follow Antiestrogens to start at the completion of local treatment --I expect she will be done with radiation towards the end of January so she will return to see [place] in February.  At that time we will discuss antiestrogen options. --She knows to call for any questions or concerns that may arise between now and her next appointment  Lymphedema issues, if any:  Patient denies--was evaluated by PT earlier in the month ([facility] screening) and told she had "no signs of lymphedema at this time"  Pain issues, if any:  Patient denies   SAFETY ISSUES: Prior radiation? No Pacemaker/ICD? No Possible current pregnancy? No--hysterectomy Is the patient on methotrexate? No  Current Complaints / other details:  Nothing else of note

## 2021-03-14 ENCOUNTER — Ambulatory Visit
Admission: RE | Admit: 2021-03-14 | Discharge: 2021-03-14 | Disposition: A | Discharge status: Home / Self Care | Payer: Medicare HMO | Source: Ambulatory Visit | Attending: Radiation Oncology | Admitting: Radiation Oncology

## 2021-03-14 ENCOUNTER — Other Ambulatory Visit: Payer: Self-pay

## 2021-03-14 ENCOUNTER — Encounter: Payer: Self-pay | Admitting: Radiation Oncology

## 2021-03-14 VITALS — BP 146/73 | HR 102 | Temp 97.9°F | Resp 20 | Ht 67.0 in | Wt 210.2 lb

## 2021-03-14 DIAGNOSIS — C50211 Malignant neoplasm of upper-inner quadrant of right female breast: Secondary | ICD-10-CM | POA: Insufficient documentation

## 2021-03-14 DIAGNOSIS — Z7984 Long term (current) use of oral hypoglycemic drugs: Secondary | ICD-10-CM | POA: Insufficient documentation

## 2021-03-14 DIAGNOSIS — R531 Weakness: Secondary | ICD-10-CM | POA: Diagnosis not present

## 2021-03-14 DIAGNOSIS — R5383 Other fatigue: Secondary | ICD-10-CM | POA: Insufficient documentation

## 2021-03-14 DIAGNOSIS — M25473 Effusion, unspecified ankle: Secondary | ICD-10-CM | POA: Diagnosis not present

## 2021-03-14 DIAGNOSIS — M791 Myalgia, unspecified site: Secondary | ICD-10-CM | POA: Insufficient documentation

## 2021-03-14 DIAGNOSIS — R300 Dysuria: Secondary | ICD-10-CM | POA: Diagnosis not present

## 2021-03-14 DIAGNOSIS — Z79899 Other long term (current) drug therapy: Secondary | ICD-10-CM | POA: Insufficient documentation

## 2021-03-14 DIAGNOSIS — Z791 Long term (current) use of non-steroidal anti-inflammatories (NSAID): Secondary | ICD-10-CM | POA: Insufficient documentation

## 2021-03-14 DIAGNOSIS — K5900 Constipation, unspecified: Secondary | ICD-10-CM | POA: Diagnosis not present

## 2021-03-14 DIAGNOSIS — Z170 Estrogen receptor positive status [ER+]: Secondary | ICD-10-CM | POA: Insufficient documentation

## 2021-03-19 ENCOUNTER — Encounter: Payer: Self-pay | Admitting: *Deleted

## 2021-03-25 ENCOUNTER — Other Ambulatory Visit: Payer: Self-pay

## 2021-03-25 ENCOUNTER — Ambulatory Visit: Payer: Medicare HMO

## 2021-03-25 ENCOUNTER — Ambulatory Visit
Admission: RE | Admit: 2021-03-25 | Discharge: 2021-03-25 | Disposition: A | Discharge status: Home / Self Care | Payer: Medicare HMO | Source: Ambulatory Visit | Attending: Radiation Oncology | Admitting: Radiation Oncology

## 2021-03-25 ENCOUNTER — Other Ambulatory Visit: Payer: Medicare HMO

## 2021-03-25 DIAGNOSIS — Z170 Estrogen receptor positive status [ER+]: Secondary | ICD-10-CM | POA: Diagnosis not present

## 2021-03-25 DIAGNOSIS — C50211 Malignant neoplasm of upper-inner quadrant of right female breast: Secondary | ICD-10-CM | POA: Insufficient documentation

## 2021-03-25 MED ORDER — RADIAPLEXRX EX GEL: Freq: Once | CUTANEOUS | Status: AC

## 2021-03-25 MED ORDER — ALRA NON-METALLIC DEODORANT (RAD-ONC)
1.0000 "application " | Freq: Once | TOPICAL | Status: AC
  Administered 2021-03-25: 1 via TOPICAL

## 2021-03-25 NOTE — Progress Notes (Signed)
Pt here for patient teaching.    Pt given skin care instructions, Alra deodorant, and Radiaplex gel.    Reviewed areas of pertinence such as fatigue, hair loss, skin changes, breast tenderness, and breast swelling .   Pt able to give teach back of to pat skin, use unscented/gentle soap, and drink plenty of water,apply Radiaplex bid, avoid applying anything to skin within 4 hours of treatment, avoid wearing an under wire bra, and to use an electric razor if they must shave.   Pt demonstrated understanding and verbalizes understanding of information given and will contact nursing with any questions or concerns.    [email or web address]

## 2021-03-25 NOTE — Patient Instructions (Signed)
°Hypomagnesemia °Hypomagnesemia is a condition in which the level of magnesium in the blood is too low. Magnesium is a mineral that is found in many foods. It is used in many different processes in the body. Hypomagnesemia can affect every organ in the body. In severe cases, it can cause life-threatening problems. °What are the causes? °This condition may be caused by: °Not getting enough magnesium in your diet or not having enough healthy foods to eat (malnutrition). °Problems with magnesium absorption in the intestines. °Dehydration. °Excessive use of alcohol. °Vomiting. °Severe or long-term (chronic) diarrhea. °Some medicines, including medicines that make you urinate more often (diuretics). °Certain diseases, such as kidney disease, diabetes, celiac disease, and overactive thyroid. °What are the signs or symptoms? °Symptoms of this condition include: °Loss of appetite, nausea, and vomiting. °Involuntary shaking or trembling of a body part (tremor). °Muscle weakness or tingling in the arms and legs. °Sudden tightening of muscles (muscle spasms). °Confusion. °Psychiatric issues, such as: °Depression and irritability. °Psychosis. °A feeling of fluttering of the heart (palpitations). °Seizures. °These symptoms are more severe if magnesium levels drop suddenly. °How is this diagnosed? °This condition may be diagnosed based on: °Your symptoms and medical history. °A physical exam. °Blood and urine tests. °How is this treated? °Treatment depends on the cause and the severity of the condition. It may be treated by: °Taking a magnesium supplement. This can be taken in pill form. If the condition is severe, magnesium is usually given through an IV. °Making changes to your diet. You may be directed to eat foods that have a lot of magnesium, such as green leafy vegetables, peas, beans, and nuts. °Not drinking alcohol. If you are struggling not to drink, ask your health care provider for help. °Follow these instructions at  home: °Eating and drinking °  °Make sure that your diet includes foods with magnesium. Foods that have a lot of magnesium in them include: °Green leafy vegetables, such as spinach and broccoli. °Beans and peas. °Nuts and seeds, such as almonds and sunflower seeds. °Whole grains, such as whole grain bread and fortified cereals. °Drink fluids that contain salts and minerals (electrolytes), such as sports drinks, when you are active. °Do not drink alcohol. °General instructions °Take over-the-counter and prescription medicines only as told by your health care provider. °Take magnesium supplements as directed if your health care provider tells you to take them. °Have your magnesium levels monitored as told by your health care provider. °Keep all follow-up visits. This is important. °Contact a health care provider if: °You get worse instead of better. °Your symptoms return. °Get help right away if: °You develop severe muscle weakness. °You have trouble breathing. °You feel that your heart is racing. °These symptoms may represent a serious problem that is an emergency. Do not wait to see if the symptoms will go away. Get medical help right away. Call your local emergency services (911 in the [place] Do not drive yourself to the hospital. °Summary °Hypomagnesemia is a condition in which the level of magnesium in the blood is too low. °Hypomagnesemia can affect every organ in the body. °Treatment may include eating more foods that contain magnesium, taking magnesium supplements, and not drinking alcohol. °Have your magnesium levels monitored as told by your health care provider. °This information is not intended to replace advice given to you by your health care provider. Make sure you discuss any questions you have with your health care provider. °Document Revised: 08/06/2020 Document Reviewed: 08/06/2020 °Elsevier Patient Education © 2022   Elsevier Inc. ° °

## 2021-03-26 ENCOUNTER — Ambulatory Visit
Admission: RE | Admit: 2021-03-26 | Discharge: 2021-03-26 | Disposition: A | Discharge status: Home / Self Care | Payer: Medicare HMO | Source: Ambulatory Visit | Attending: Radiation Oncology | Admitting: Radiation Oncology

## 2021-03-26 DIAGNOSIS — C50211 Malignant neoplasm of upper-inner quadrant of right female breast: Secondary | ICD-10-CM | POA: Diagnosis not present

## 2021-03-26 DIAGNOSIS — Z170 Estrogen receptor positive status [ER+]: Secondary | ICD-10-CM | POA: Diagnosis not present

## 2021-03-27 ENCOUNTER — Other Ambulatory Visit: Payer: Self-pay

## 2021-03-27 ENCOUNTER — Ambulatory Visit
Admission: RE | Admit: 2021-03-27 | Discharge: 2021-03-27 | Disposition: A | Discharge status: Home / Self Care | Payer: Medicare HMO | Source: Ambulatory Visit | Attending: Radiation Oncology | Admitting: Radiation Oncology

## 2021-03-27 DIAGNOSIS — C50211 Malignant neoplasm of upper-inner quadrant of right female breast: Secondary | ICD-10-CM | POA: Diagnosis not present

## 2021-03-27 DIAGNOSIS — Z170 Estrogen receptor positive status [ER+]: Secondary | ICD-10-CM | POA: Diagnosis not present

## 2021-03-28 ENCOUNTER — Ambulatory Visit
Admission: RE | Admit: 2021-03-28 | Discharge: 2021-03-28 | Disposition: A | Discharge status: Home / Self Care | Payer: Medicare HMO | Source: Ambulatory Visit | Attending: Radiation Oncology | Admitting: Radiation Oncology

## 2021-03-28 DIAGNOSIS — C50211 Malignant neoplasm of upper-inner quadrant of right female breast: Secondary | ICD-10-CM | POA: Diagnosis not present

## 2021-03-28 DIAGNOSIS — Z170 Estrogen receptor positive status [ER+]: Secondary | ICD-10-CM | POA: Diagnosis not present

## 2021-03-31 ENCOUNTER — Other Ambulatory Visit: Payer: Self-pay

## 2021-03-31 ENCOUNTER — Ambulatory Visit
Admission: RE | Admit: 2021-03-31 | Discharge: 2021-03-31 | Disposition: A | Discharge status: Home / Self Care | Payer: Medicare HMO | Source: Ambulatory Visit | Attending: Radiation Oncology | Admitting: Radiation Oncology

## 2021-03-31 DIAGNOSIS — Z170 Estrogen receptor positive status [ER+]: Secondary | ICD-10-CM | POA: Diagnosis not present

## 2021-03-31 DIAGNOSIS — C50211 Malignant neoplasm of upper-inner quadrant of right female breast: Secondary | ICD-10-CM | POA: Diagnosis not present

## 2021-04-01 ENCOUNTER — Ambulatory Visit
Admission: RE | Admit: 2021-04-01 | Discharge: 2021-04-01 | Disposition: A | Discharge status: Home / Self Care | Payer: Medicare HMO | Source: Ambulatory Visit | Attending: Radiation Oncology | Admitting: Radiation Oncology

## 2021-04-01 DIAGNOSIS — Z170 Estrogen receptor positive status [ER+]: Secondary | ICD-10-CM | POA: Diagnosis not present

## 2021-04-01 DIAGNOSIS — C50211 Malignant neoplasm of upper-inner quadrant of right female breast: Secondary | ICD-10-CM | POA: Diagnosis not present

## 2021-04-02 ENCOUNTER — Other Ambulatory Visit: Payer: Self-pay

## 2021-04-02 ENCOUNTER — Ambulatory Visit
Admission: RE | Admit: 2021-04-02 | Discharge: 2021-04-02 | Disposition: A | Discharge status: Home / Self Care | Payer: Medicare HMO | Source: Ambulatory Visit | Attending: Radiation Oncology | Admitting: Radiation Oncology

## 2021-04-02 DIAGNOSIS — Z170 Estrogen receptor positive status [ER+]: Secondary | ICD-10-CM | POA: Diagnosis not present

## 2021-04-02 DIAGNOSIS — C50211 Malignant neoplasm of upper-inner quadrant of right female breast: Secondary | ICD-10-CM | POA: Diagnosis not present

## 2021-04-03 ENCOUNTER — Ambulatory Visit
Admission: RE | Admit: 2021-04-03 | Discharge: 2021-04-03 | Disposition: A | Discharge status: Home / Self Care | Payer: Medicare HMO | Source: Ambulatory Visit | Attending: Radiation Oncology | Admitting: Radiation Oncology

## 2021-04-03 DIAGNOSIS — C50211 Malignant neoplasm of upper-inner quadrant of right female breast: Secondary | ICD-10-CM | POA: Diagnosis not present

## 2021-04-03 DIAGNOSIS — Z170 Estrogen receptor positive status [ER+]: Secondary | ICD-10-CM | POA: Diagnosis not present

## 2021-04-04 ENCOUNTER — Other Ambulatory Visit: Payer: Self-pay

## 2021-04-04 ENCOUNTER — Ambulatory Visit
Admission: RE | Admit: 2021-04-04 | Discharge: 2021-04-04 | Disposition: A | Discharge status: Home / Self Care | Payer: Medicare HMO | Source: Ambulatory Visit | Attending: Radiation Oncology | Admitting: Radiation Oncology

## 2021-04-04 ENCOUNTER — Telehealth: Payer: Self-pay | Admitting: Hematology and Oncology

## 2021-04-04 DIAGNOSIS — Z170 Estrogen receptor positive status [ER+]: Secondary | ICD-10-CM | POA: Diagnosis not present

## 2021-04-04 DIAGNOSIS — C50211 Malignant neoplasm of upper-inner quadrant of right female breast: Secondary | ICD-10-CM | POA: Diagnosis not present

## 2021-04-04 NOTE — Telephone Encounter (Signed)
R/s per 1/12 inbasket, pt req appt time be moved, appt r/s pt awae

## 2021-04-07 ENCOUNTER — Ambulatory Visit: Payer: Medicare HMO | Admitting: Radiation Oncology

## 2021-04-07 ENCOUNTER — Ambulatory Visit
Admission: RE | Admit: 2021-04-07 | Discharge: 2021-04-07 | Disposition: A | Discharge status: Home / Self Care | Payer: Medicare HMO | Source: Ambulatory Visit | Attending: Radiation Oncology | Admitting: Radiation Oncology

## 2021-04-07 ENCOUNTER — Other Ambulatory Visit: Payer: Self-pay

## 2021-04-07 DIAGNOSIS — Z170 Estrogen receptor positive status [ER+]: Secondary | ICD-10-CM | POA: Diagnosis not present

## 2021-04-07 DIAGNOSIS — C50211 Malignant neoplasm of upper-inner quadrant of right female breast: Secondary | ICD-10-CM | POA: Diagnosis not present

## 2021-04-08 ENCOUNTER — Ambulatory Visit: Payer: Medicare HMO | Admitting: Radiation Oncology

## 2021-04-08 ENCOUNTER — Ambulatory Visit
Admission: RE | Admit: 2021-04-08 | Discharge: 2021-04-08 | Disposition: A | Discharge status: Home / Self Care | Payer: Medicare HMO | Source: Ambulatory Visit | Attending: Radiation Oncology | Admitting: Radiation Oncology

## 2021-04-08 DIAGNOSIS — C50211 Malignant neoplasm of upper-inner quadrant of right female breast: Secondary | ICD-10-CM | POA: Diagnosis not present

## 2021-04-08 DIAGNOSIS — Z170 Estrogen receptor positive status [ER+]: Secondary | ICD-10-CM | POA: Diagnosis not present

## 2021-04-09 ENCOUNTER — Ambulatory Visit
Admission: RE | Admit: 2021-04-09 | Discharge: 2021-04-09 | Disposition: A | Discharge status: Home / Self Care | Payer: Medicare HMO | Source: Ambulatory Visit | Attending: Radiation Oncology | Admitting: Radiation Oncology

## 2021-04-09 ENCOUNTER — Other Ambulatory Visit: Payer: Self-pay

## 2021-04-09 DIAGNOSIS — C50211 Malignant neoplasm of upper-inner quadrant of right female breast: Secondary | ICD-10-CM | POA: Diagnosis not present

## 2021-04-09 DIAGNOSIS — Z170 Estrogen receptor positive status [ER+]: Secondary | ICD-10-CM | POA: Diagnosis not present

## 2021-04-10 ENCOUNTER — Ambulatory Visit
Admission: RE | Admit: 2021-04-10 | Discharge: 2021-04-10 | Disposition: A | Discharge status: Home / Self Care | Payer: Medicare HMO | Source: Ambulatory Visit | Attending: Radiation Oncology | Admitting: Radiation Oncology

## 2021-04-10 DIAGNOSIS — Z170 Estrogen receptor positive status [ER+]: Secondary | ICD-10-CM | POA: Diagnosis not present

## 2021-04-10 DIAGNOSIS — C50211 Malignant neoplasm of upper-inner quadrant of right female breast: Secondary | ICD-10-CM | POA: Diagnosis not present

## 2021-04-11 ENCOUNTER — Ambulatory Visit: Admission: RE | Admit: 2021-04-11 | Payer: Medicare HMO | Source: Ambulatory Visit

## 2021-04-14 ENCOUNTER — Ambulatory Visit
Admission: RE | Admit: 2021-04-14 | Discharge: 2021-04-14 | Disposition: A | Discharge status: Home / Self Care | Payer: Medicare HMO | Source: Ambulatory Visit | Attending: Radiation Oncology | Admitting: Radiation Oncology

## 2021-04-14 ENCOUNTER — Other Ambulatory Visit: Payer: Self-pay

## 2021-04-14 DIAGNOSIS — Z170 Estrogen receptor positive status [ER+]: Secondary | ICD-10-CM | POA: Diagnosis not present

## 2021-04-14 DIAGNOSIS — C50211 Malignant neoplasm of upper-inner quadrant of right female breast: Secondary | ICD-10-CM | POA: Diagnosis not present

## 2021-04-14 MED ORDER — RADIAPLEXRX EX GEL: Freq: Once | CUTANEOUS | Status: AC

## 2021-04-15 ENCOUNTER — Ambulatory Visit
Admission: RE | Admit: 2021-04-15 | Discharge: 2021-04-15 | Disposition: A | Discharge status: Home / Self Care | Payer: Medicare HMO | Source: Ambulatory Visit | Attending: Radiation Oncology | Admitting: Radiation Oncology

## 2021-04-15 DIAGNOSIS — C50211 Malignant neoplasm of upper-inner quadrant of right female breast: Secondary | ICD-10-CM | POA: Diagnosis not present

## 2021-04-15 DIAGNOSIS — Z170 Estrogen receptor positive status [ER+]: Secondary | ICD-10-CM | POA: Diagnosis not present

## 2021-04-16 ENCOUNTER — Ambulatory Visit
Admission: RE | Admit: 2021-04-16 | Discharge: 2021-04-16 | Disposition: A | Discharge status: Home / Self Care | Payer: Medicare HMO | Source: Ambulatory Visit | Attending: Radiation Oncology | Admitting: Radiation Oncology

## 2021-04-16 ENCOUNTER — Other Ambulatory Visit: Payer: Self-pay

## 2021-04-16 DIAGNOSIS — C50211 Malignant neoplasm of upper-inner quadrant of right female breast: Secondary | ICD-10-CM | POA: Diagnosis not present

## 2021-04-16 DIAGNOSIS — Z170 Estrogen receptor positive status [ER+]: Secondary | ICD-10-CM | POA: Diagnosis not present

## 2021-04-17 ENCOUNTER — Ambulatory Visit
Admission: RE | Admit: 2021-04-17 | Discharge: 2021-04-17 | Disposition: A | Discharge status: Home / Self Care | Payer: Medicare HMO | Source: Ambulatory Visit | Attending: Radiation Oncology | Admitting: Radiation Oncology

## 2021-04-17 DIAGNOSIS — C50211 Malignant neoplasm of upper-inner quadrant of right female breast: Secondary | ICD-10-CM | POA: Diagnosis not present

## 2021-04-17 DIAGNOSIS — Z170 Estrogen receptor positive status [ER+]: Secondary | ICD-10-CM | POA: Diagnosis not present

## 2021-04-18 ENCOUNTER — Ambulatory Visit
Admission: RE | Admit: 2021-04-18 | Discharge: 2021-04-18 | Disposition: A | Discharge status: Home / Self Care | Payer: Medicare HMO | Source: Ambulatory Visit | Attending: Radiation Oncology | Admitting: Radiation Oncology

## 2021-04-18 DIAGNOSIS — Z170 Estrogen receptor positive status [ER+]: Secondary | ICD-10-CM | POA: Diagnosis not present

## 2021-04-18 DIAGNOSIS — C50211 Malignant neoplasm of upper-inner quadrant of right female breast: Secondary | ICD-10-CM | POA: Diagnosis not present

## 2021-04-21 ENCOUNTER — Other Ambulatory Visit: Payer: Self-pay

## 2021-04-21 ENCOUNTER — Ambulatory Visit
Admission: RE | Admit: 2021-04-21 | Discharge: 2021-04-21 | Disposition: A | Discharge status: Home / Self Care | Payer: Medicare HMO | Source: Ambulatory Visit | Attending: Radiation Oncology | Admitting: Radiation Oncology

## 2021-04-21 ENCOUNTER — Encounter: Payer: Self-pay | Admitting: *Deleted

## 2021-04-21 DIAGNOSIS — C50211 Malignant neoplasm of upper-inner quadrant of right female breast: Secondary | ICD-10-CM | POA: Diagnosis not present

## 2021-04-21 DIAGNOSIS — Z170 Estrogen receptor positive status [ER+]: Secondary | ICD-10-CM

## 2021-04-22 ENCOUNTER — Encounter: Payer: Self-pay | Admitting: Radiation Oncology

## 2021-04-22 ENCOUNTER — Ambulatory Visit
Admission: RE | Admit: 2021-04-22 | Discharge: 2021-04-22 | Disposition: A | Discharge status: Home / Self Care | Payer: Medicare HMO | Source: Ambulatory Visit | Attending: Radiation Oncology | Admitting: Radiation Oncology

## 2021-04-22 DIAGNOSIS — C50211 Malignant neoplasm of upper-inner quadrant of right female breast: Secondary | ICD-10-CM | POA: Diagnosis not present

## 2021-04-22 DIAGNOSIS — Z170 Estrogen receptor positive status [ER+]: Secondary | ICD-10-CM | POA: Diagnosis not present

## 2021-04-28 ENCOUNTER — Encounter: Payer: Self-pay | Admitting: Oncology

## 2021-04-28 NOTE — Progress Notes (Signed)
° °                                                                                                                                                          °  Patient Name: [name] MRN: [id number] DOB: 1955/06/05 Referring Physician: Moreen Fowler DAVID (Profile Not Attached) Date of Service: 04/22/2021 [facility] [place], [place]                                                        End Of Treatment Note  Diagnoses: C50.211-Malignant neoplasm of upper-inner quadrant of right female breast  Cancer Staging:  Cancer Staging  Malignant neoplasm of upper-inner quadrant of right breast in female, estrogen receptor positive ([place]) Staging form: Breast, AJCC 8th Edition - Clinical stage from 10/16/2020: Stage IA (cT1b, cN0, cM0, G1, ER+, PR-, HER2-) - Signed by [name], MD on 10/16/2020 Stage prefix: Initial diagnosis Histologic grading system: 3 grade system Laterality: Right Staged by: Pathologist and managing physician Stage used in treatment planning: Yes National guidelines used in treatment planning: Yes Type of national guideline used in treatment planning: NCCN   pT1b, pN0   Intent: Curative  Radiation Treatment Dates: 03/25/2021 through 04/22/2021 Site Technique Total Dose (Gy) Dose per Fx (Gy) Completed Fx Beam Energies  Breast, Right: Breast_R 3D 40.05/40.05 2.67 15/15 6X, 10X  Breast, Right: Breast_R_Bst specialPort 10/10 2 5/5 15E   Narrative: The patient tolerated radiation therapy relatively well.   Plan: The patient will follow-up with radiation oncology in 51mo. -----------------------------------  Eppie Gibson, MD

## 2021-05-05 ENCOUNTER — Inpatient Hospital Stay: Payer: Medicare HMO | Attending: Oncology

## 2021-05-05 ENCOUNTER — Ambulatory Visit: Payer: Medicare HMO | Admitting: Hematology and Oncology

## 2021-05-05 ENCOUNTER — Other Ambulatory Visit: Payer: Medicare HMO

## 2021-05-05 ENCOUNTER — Inpatient Hospital Stay: Payer: Medicare HMO | Admitting: Hematology and Oncology

## 2021-05-05 ENCOUNTER — Other Ambulatory Visit: Payer: Self-pay

## 2021-05-05 ENCOUNTER — Encounter: Payer: Self-pay | Admitting: Hematology and Oncology

## 2021-05-05 VITALS — BP 157/72 | HR 92 | Temp 97.4°F | Resp 18 | Ht 67.0 in | Wt 208.6 lb

## 2021-05-05 DIAGNOSIS — Z794 Long term (current) use of insulin: Secondary | ICD-10-CM | POA: Diagnosis not present

## 2021-05-05 DIAGNOSIS — Z923 Personal history of irradiation: Secondary | ICD-10-CM | POA: Insufficient documentation

## 2021-05-05 DIAGNOSIS — E782 Mixed hyperlipidemia: Secondary | ICD-10-CM | POA: Diagnosis not present

## 2021-05-05 DIAGNOSIS — G4700 Insomnia, unspecified: Secondary | ICD-10-CM | POA: Diagnosis not present

## 2021-05-05 DIAGNOSIS — F419 Anxiety disorder, unspecified: Secondary | ICD-10-CM | POA: Diagnosis not present

## 2021-05-05 DIAGNOSIS — C50211 Malignant neoplasm of upper-inner quadrant of right female breast: Secondary | ICD-10-CM | POA: Diagnosis not present

## 2021-05-05 DIAGNOSIS — Z170 Estrogen receptor positive status [ER+]: Secondary | ICD-10-CM | POA: Insufficient documentation

## 2021-05-05 DIAGNOSIS — Z9221 Personal history of antineoplastic chemotherapy: Secondary | ICD-10-CM | POA: Insufficient documentation

## 2021-05-05 DIAGNOSIS — E1169 Type 2 diabetes mellitus with other specified complication: Secondary | ICD-10-CM | POA: Diagnosis not present

## 2021-05-05 DIAGNOSIS — Z8616 Personal history of COVID-19: Secondary | ICD-10-CM | POA: Diagnosis not present

## 2021-05-05 DIAGNOSIS — C50911 Malignant neoplasm of unspecified site of right female breast: Secondary | ICD-10-CM | POA: Diagnosis not present

## 2021-05-05 DIAGNOSIS — Z95828 Presence of other vascular implants and grafts: Secondary | ICD-10-CM

## 2021-05-05 DIAGNOSIS — I10 Essential (primary) hypertension: Secondary | ICD-10-CM | POA: Diagnosis not present

## 2021-05-05 DIAGNOSIS — G629 Polyneuropathy, unspecified: Secondary | ICD-10-CM | POA: Diagnosis not present

## 2021-05-05 LAB — COMPREHENSIVE METABOLIC PANEL
ALT: 21 U/L (ref 0–44)
AST: 21 U/L (ref 15–41)
Albumin: 4 g/dL (ref 3.5–5.0)
Alkaline Phosphatase: 71 U/L (ref 38–126)
Anion gap: 8 (ref 5–15)
BUN: 8 mg/dL (ref 8–23)
CO2: 28 mmol/L (ref 22–32)
Calcium: 9.4 mg/dL (ref 8.9–10.3)
Chloride: 102 mmol/L (ref 98–111)
Creatinine, Ser: 0.74 mg/dL (ref 0.44–1.00)
GFR, Estimated: >60 mL/min (ref >60)
Glucose, Bld: 158 mg/dL — ABNORMAL HIGH (ref 70–99)
Potassium: 4.1 mmol/L (ref 3.5–5.1)
Sodium: 138 mmol/L (ref 135–145)
Total Bilirubin: 0.3 mg/dL (ref 0.3–1.2)
Total Protein: 6.8 g/dL (ref 6.5–8.1)

## 2021-05-05 LAB — CBC WITH DIFFERENTIAL/PLATELET
Abs Immature Granulocytes: 0.01 10*3/uL (ref 0.00–0.07)
Basophils Absolute: 0.1 10*3/uL (ref 0.0–0.1)
Basophils Relative: 1 %
Eosinophils Absolute: 0.2 10*3/uL (ref 0.0–0.5)
Eosinophils Relative: 5 %
HCT: 35.7 % — ABNORMAL LOW (ref 36.0–46.0)
Hemoglobin: 11.5 g/dL — ABNORMAL LOW (ref 12.0–15.0)
Immature Granulocytes: 0 %
Lymphocytes Relative: 29 %
Lymphs Abs: 1.5 10*3/uL (ref 0.7–4.0)
MCH: 29.3 pg (ref 26.0–34.0)
MCHC: 32.2 g/dL (ref 30.0–36.0)
MCV: 90.8 fL (ref 80.0–100.0)
Monocytes Absolute: 0.4 10*3/uL (ref 0.1–1.0)
Monocytes Relative: 7 %
Neutro Abs: 3 10*3/uL (ref 1.7–7.7)
Neutrophils Relative %: 58 %
Platelets: 190 10*3/uL (ref 150–400)
RBC: 3.93 MIL/uL (ref 3.87–5.11)
RDW: 14.5 % (ref 11.5–15.5)
WBC: 5.1 10*3/uL (ref 4.0–10.5)
nRBC: 0 % (ref 0.0–0.2)

## 2021-05-05 MED ORDER — ANASTROZOLE 1 MG PO TABS: 1.0000 mg | ORAL_TABLET | Freq: Every day | ORAL | 3 refills | Status: DC

## 2021-05-05 MED ORDER — SODIUM CHLORIDE 0.9% FLUSH
10.0000 mL | INTRAVENOUS | Status: DC | PRN
  Administered 2021-05-05: 10 mL via INTRAVENOUS

## 2021-05-05 MED ORDER — HEPARIN SOD (PORK) LOCK FLUSH 100 UNIT/ML IV SOLN
500.0000 [IU] | Freq: Once | INTRAVENOUS | Status: AC
  Administered 2021-05-05: 500 [IU] via INTRAVENOUS

## 2021-05-05 MED ORDER — NYSTATIN 100000 UNIT/GM EX POWD: 1.0000 "application " | Freq: Three times a day (TID) | CUTANEOUS | 0 refills | Status: DC

## 2021-05-05 NOTE — Progress Notes (Signed)
[name]  [phone] [phone]     ID: [name] DOB: [birth year]  MR#: [id number]  SFK#:[id number]  Patient Care Team: Antony Contras, MD as PCP - General (Family Medicine) Mauro Kaufmann, RN as Oncology Nurse Navigator Rockwell Germany, RN as Oncology Nurse Navigator Rolm Bookbinder, MD as Consulting Physician (General Surgery) Magrinat, Virgie Dad, MD (Inactive) as Consulting Physician (Oncology) Eppie Gibson, MD as Attending Physician (Radiation Oncology) Arvella Nigh, MD as Consulting Physician (Obstetrics and Gynecology) Jacelyn Pi, MD as Referring Physician (Endocrinology) Benay Pike, MD OTHER MD:  CHIEF COMPLAINT: Estrogen receptor positive breast cancer  CURRENT TREATMENT: Adjuvant chemotherapy  INTERVAL HISTORY:  [name] returns today for follow up with her husband and daughters for follow up of her estrogen receptor positive breast cancer.  She completed 4 cycles of adjuvant TC, last cycle completed on 03/03/2021 She completed adjuvant radiation 2 weeks ago She is here to discuss anti estrogen therapy She has recovered well from radiation, has some ongoing skin changes in the right breast.  She continues to have insomnia, has difficulty falling asleep, husband states is likely situational given the cancer diagnosis.  She also had some questions about surveillance recommendations. Rest of the pertinent 10 point ROS reviewed and negative.  REVIEW OF SYSTEMS:     A detailed review of systems today was otherwise stable.  COVID 19 VACCINATION STATUS: Pfizer x3 as of July 2022   HISTORY OF CURRENT ILLNESS:  From the original intake note:  [name] had routine screening mammography on 09/04/2020 showing a possible abnormality in the right breast. She underwent right diagnostic mammography with tomography and right breast ultrasonography at [facility] on 09/30/2020 showing: breast density category B; 0.7 cm mass  in right breast at 12:30; normal right axillary lymph nodes.  Accordingly on 10/04/2020 she proceeded to biopsy of the right breast area in question. The pathology from this procedure ([id number]) showed: invasive mammary carcinoma, e-cadherin negative, grade 1; atypical lobular hyperplasia. Prognostic indicators significant for: estrogen receptor, 90% positive with moderate staining intensity and progesterone receptor, 0% negative. Proliferation marker Ki67 at 10%. HER2 negative by immunohistochemistry (1+).   Cancer Staging  Malignant neoplasm of upper-inner quadrant of right breast in female, estrogen receptor positive ([place]) Staging form: Breast, AJCC 8th Edition - Clinical stage from 10/16/2020: Stage IA (cT1b, cN0, cM0, G1, ER+, PR-, HER2-) - Signed by [name], MD on 10/16/2020 Stage prefix: Initial diagnosis Histologic grading system: 3 grade system Laterality: Right Staged by: Pathologist and managing physician Stage used in treatment planning: Yes National guidelines used in treatment planning: Yes Type of national guideline used in treatment planning: NCCN   The patient's subsequent history is as detailed below.   PAST MEDICAL HISTORY: Past Medical History:  Diagnosis Date   Cancer ([place])    Cataract    removed   Diabetes mellitus without complication ([place])    History of kidney stones    Hyperlipidemia    Hypertension    PONV (postoperative nausea and vomiting)     PAST SURGICAL HISTORY: Past Surgical History:  Procedure Laterality Date   ABDOMINAL HYSTERECTOMY     BREAST LUMPECTOMY Right 01/09/2021   Procedure: incision drainage right breast abscess;  Surgeon: Rolm Bookbinder, MD;  Location: [facility];  Service: General;  Laterality: Right;   BREAST LUMPECTOMY WITH RADIOACTIVE SEED AND SENTINEL LYMPH NODE BIOPSY Right 11/05/2020   Procedure: RIGHT BREAST LUMPECTOMY WITH RADIOACTIVE SEED AND RIGHT AXILLARY SENTINEL LYMPH NODE  BIOPSY;  Surgeon: Rolm Bookbinder, MD;  Location: [facility];  Service: General;  Laterality: Right;   CHOLECYSTECTOMY     KNEE SURGERY     PORTACATH PLACEMENT Right 12/30/2020   Procedure: INSERTION PORT-A-CATH;  Surgeon: Rolm Bookbinder, MD;  Location: [facility];  Service: General;  Laterality: Right;    FAMILY HISTORY: Family History  Problem Relation Age of Onset   Heart attack Mother    Diabetes Father    Colon cancer Maternal Grandmother    Breast cancer Cousin        dx early 61's   Her father died at age 38 from diabetes complications. Her mother died at age 10 from MI. [name] has two brothers (and no sisters). She reports breast cancer in a maternal cousin in her early 109's and colon cancer in a maternal grandmother.   GYNECOLOGIC HISTORY:  No LMP recorded. Patient has had a hysterectomy. Menarche: 66 years old Age at first live birth: 66 years old [place] P 3 LMP 02/2000 Contraceptive: used from 82 HRT never used  Hysterectomy? Yes, 02/2000 BSO? 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 Husband [name] is a retired Radiographer, therapeutic. Son [name], age 61, is a Geophysicist/field seismologist and youth pastor in [place]. Daughter [name], age 39, is a high school principal in [place]. Daughter [name], age 71, is a hair stylist here in [place]. [name] has two grandchildren. She attends a [employer].    ADVANCED DIRECTIVES: In the absence of any documentation to the contrary, the patient's spouse is their HCPOA.    HEALTH MAINTENANCE: Social History   Tobacco Use   Smoking status: Former    Packs/day: 0.50    Years: 10.00    Pack years: 5.00    Types: Cigarettes    Quit date: 09/25/1981    Years since quitting: 39.6   Smokeless tobacco: Never  Vaping Use   Vaping Use: Never used  Substance Use Topics   Alcohol use: Never   Drug use: Never      Colonoscopy: 2018 (Dr. Earlean Shawl)  PAP: 08/2020  Bone density: 08/2020   Allergies  Allergen Reactions   Glimepiride     Hypoglycemia     Current Outpatient Medications  Medication Sig Dispense Refill   anastrozole (ARIMIDEX) 1 MG tablet Take 1 tablet (1 mg total) by mouth daily. 90 tablet 3   ACCU-CHEK GUIDE test strip      acetaminophen (TYLENOL) 500 MG tablet Take 1,000 mg by mouth every 6 (six) hours as needed for moderate pain.     amitriptyline (ELAVIL) 25 MG tablet Take 25 mg by mouth at bedtime.     Blood Glucose Monitoring Suppl (ACCU-CHEK GUIDE ME) w/Device KIT      Cholecalciferol (DIALYVITE VITAMIN D 5000) 125 MCG (5000 UT) capsule Take 5,000 Units by mouth daily.     fluconazole (DIFLUCAN) 150 MG tablet Take 1 tablet (150 mg total) by mouth daily. 30 tablet 0   ibuprofen (ADVIL) 200 MG tablet Take 400 mg by mouth every 6 (six) hours as needed for moderate pain or headache.     Insulin Syringe-Needle U-100 (INSULIN SYRINGE .5CC/30GX5/16") 30G X 5/16" 0.5 ML MISC See admin instructions.     lidocaine-prilocaine (EMLA) cream Apply to affected area once (Patient taking differently: Apply 1 application topically. Apply to affected area once) 30  g 3   LORazepam (ATIVAN) 0.5 MG tablet Take 1 tablet (0.5 mg total) by mouth at bedtime as needed (Nausea or vomiting). 30 tablet 0   losartan (COZAAR) 25 MG tablet Take 25 mg by mouth daily.     metFORMIN (GLUCOPHAGE) 500 MG tablet Take 1,000 mg by mouth 2 (two) times daily.     Multiple Vitamin (MULTIVITAMIN WITH MINERALS) TABS tablet Take 1 tablet by mouth daily.     Probiotic Product (PROBIOTIC PO) Take 1 capsule by mouth daily.     prochlorperazine (COMPAZINE) 10 MG tablet Take 1 tablet (10 mg total) by mouth every 6 (six) hours as needed (Nausea or vomiting). 30 tablet 1   sertraline (ZOLOFT) 50 MG tablet Take 50 mg by mouth daily.     simvastatin (ZOCOR) 40 MG tablet Take 40 mg by mouth at bedtime.     TOUJEO SOLOSTAR 300 UNIT/ML  Solostar Pen Inject 45 Units into the skin daily.     traMADol (ULTRAM) 50 MG tablet Take 1-2 tablets (50-100 mg total) by mouth every 6 (six) hours as needed for moderate pain. 30 tablet 0   No current facility-administered medications for this visit.    OBJECTIVE: White woman in no acute distress  Vitals:   05/05/21 1114  BP: (!) 157/72  Pulse: 92  Resp: 18  Temp: (!) 97.4 F (36.3 C)  SpO2: 100%       Body mass index is 32.67 kg/m.   Wt Readings from Last 3 Encounters:  05/05/21 208 lb 9.6 oz (94.6 kg)  03/14/21 210 lb 3.2 oz (95.3 kg)  03/03/21 208 lb (94.3 kg)   Physical Exam Constitutional:      Appearance: Normal appearance.  Cardiovascular:     Rate and Rhythm: Normal rate and regular rhythm.     Pulses: Normal pulses.     Heart sounds: Normal heart sounds.  Pulmonary:     Effort: Pulmonary effort is normal.     Breath sounds: Normal breath sounds.  Chest:     Comments: Bilateral breast exam done, no palpable masses or regional adenopathy.  Right breast erythema consistent with post radiation changes. Abdominal:     General: Abdomen is flat. Bowel sounds are normal.     Palpations: Abdomen is soft.  Musculoskeletal:        General: No swelling or tenderness.  Neurological:     General: No focal deficit present.     Mental Status: She is alert.     LAB RESULTS:  CMP     Component Value Date/Time   NA 138 05/05/2021 1050   K 4.1 05/05/2021 1050   CL 102 05/05/2021 1050   CO2 28 05/05/2021 1050   GLUCOSE 158 (H) 05/05/2021 1050   BUN 8 05/05/2021 1050   CREATININE 0.74 05/05/2021 1050   CREATININE 0.76 12/31/2020 0750   CALCIUM 9.4 05/05/2021 1050   PROT 6.8 05/05/2021 1050   ALBUMIN 4.0 05/05/2021 1050   AST 21 05/05/2021 1050   AST 24 12/31/2020 0750   ALT 21 05/05/2021 1050   ALT 25 12/31/2020 0750   ALKPHOS 71 05/05/2021 1050   BILITOT 0.3 05/05/2021 1050   BILITOT 0.3 12/31/2020 0750   GFRNONAA >60 05/05/2021 1050   GFRNONAA >60  12/31/2020 0750    No results found for: TOTALPROTELP, ALBUMINELP, A1GS, A2GS, BETS, BETA2SER, GAMS, MSPIKE, SPEI  Lab Results  Component Value Date   WBC 5.1 05/05/2021   NEUTROABS 3.0 05/05/2021   HGB 11.5 (L)  05/05/2021   HCT 35.7 (L) 05/05/2021   MCV 90.8 05/05/2021   PLT 190 05/05/2021    No results found for: LABCA2  No components found for: [id number]  No results for input(s): INR in the last 168 hours.  No results found for: LABCA2  No results found for: NWG956  No results found for: OZH086  No results found for: VHQ469  No results found for: CA2729  No components found for: HGQUANT  No results found for: CEA1 / No results found for: CEA1   No results found for: AFPTUMOR  No results found for: CHROMOGRNA  No results found for: KPAFRELGTCHN, LAMBDASER, KAPLAMBRATIO (kappa/lambda light chains)  No results found for: HGBA, HGBA2QUANT, HGBFQUANT, HGBSQUAN (Hemoglobinopathy evaluation)   No results found for: LDH  No results found for: IRON, TIBC, IRONPCTSAT (Iron and TIBC)  No results found for: FERRITIN  Urinalysis    Component Value Date/Time   COLORURINE AMBER (A) 01/06/2021 2145   APPEARANCEUR CLEAR 01/06/2021 2145   LABSPEC >1.046 (H) 01/06/2021 2145   PHURINE 5.0 01/06/2021 2145   GLUCOSEU 150 (A) 01/06/2021 2145   HGBUR NEGATIVE 01/06/2021 2145   [place] NEGATIVE 01/06/2021 2145   [place] NEGATIVE 01/06/2021 2145   PROTEINUR 30 (A) 01/06/2021 2145   NITRITE POSITIVE (A) 01/06/2021 2145   LEUKOCYTESUR TRACE (A) 01/06/2021 2145    STUDIES: No results found.  ELIGIBLE FOR AVAILABLE RESEARCH PROTOCOL: no  ASSESSMENT:   66 y.o. [place] woman status post right breast upper inner quadrant biopsy 10/04/2020 for clinical T1b N0, stage IA invasive lobular carcinoma, E-cadherin negative, grade 1, estrogen receptor positive, progesterone receptor and HER2 negative, with an MIB-1 of 10%  (1) status post right lumpectomy and  sentinel lymph node sampling 11/05/2020 for a PT1b pN0, stage IA invasive lobular carcinoma, grade 2  (2) Oncotype score of 32 predicts a risk of recurrence outside the breast within the next 9 years of 20% if the patient's only systemic therapy is antiestrogens for 5 years.  It also predicts significant benefit from chemotherapy.  (3) adjuvant chemotherapy with cyclophosphamide and docetaxel every 21 days x 4 started 12/31/2020, completed 03/03/2021  (4) adjuvant radiation  completed 2 weeks ago  (5) antiestrogens to start at the completion of local treatment.   PLAN: Patient is here postradiation to discuss about antiestrogen therapy.  She tolerated antiestrogen therapy well.  She is very excited to bring the bowel today, she is here with her 2 daughters and her husband. We have once again discussed about mechanism of action of tamoxifen as well as aromatase inhibitors, adverse effects from tamoxifen and aromatase inhibitors.  With tamoxifen, we have discussed about adverse effects including but not limited to postmenopausal symptoms, increased risk of DVT/PE, increased risk of endometrial cancer, benefit with bone density.  With aromatase inhibitors we have discussed about postmenopausal symptoms including arthralgias, myalgias, bone loss.  We have discussed that antiestrogen therapy can increase risk of cardiovascular events but this was not specifically significant in the study. She is agreeable to trying Arimidex, prescription sent to pharmacy of her choice. We have also discussed about surveillance recommendations, need for diagnostic mammograms for the next 5 years followed by screening mammograms.  There is no role of routine imaging.  We have discussed about history, physical exam and routine follow-up about twice a year for the next 5 years.  Given possible toxicity from aromatase inhibitors, she will follow-up in 3 months to discuss any concerning symptoms.  With regards to insomnia, we  have discussed about sleep hygiene and to avoid any caffeinated drinks. We have discussed about vitamin D supplementation, this for management of bone health.  She had a bone density scan last year with Dr. Ophelia Charter, we will have to request a copy.   Total encounter time 40 minutes*   *Total Encounter Time as defined by the Centers for Medicare and Medicaid Services includes, in addition to the face-to-face time of a patient visit (documented in the note above) non-face-to-face time: obtaining and reviewing outside history, ordering and reviewing medications, tests or procedures, care coordination (communications with other health care professionals or caregivers) and documentation in the medical record.

## 2021-05-07 ENCOUNTER — Telehealth: Payer: Self-pay

## 2021-05-07 NOTE — Telephone Encounter (Signed)
Notified Patient of prior authorization denial for Nystatin Powder and that an e-appeal had been filed with the [facility]).

## 2021-05-08 ENCOUNTER — Telehealth: Payer: Self-pay

## 2021-05-08 NOTE — Telephone Encounter (Signed)
Patient notified of prior authorization approval for Nystatin Powder. Medication is approved through 03/22/2022.

## 2021-05-19 DIAGNOSIS — E782 Mixed hyperlipidemia: Secondary | ICD-10-CM | POA: Diagnosis not present

## 2021-05-19 DIAGNOSIS — I10 Essential (primary) hypertension: Secondary | ICD-10-CM | POA: Diagnosis not present

## 2021-05-19 DIAGNOSIS — E1165 Type 2 diabetes mellitus with hyperglycemia: Secondary | ICD-10-CM | POA: Diagnosis not present

## 2021-05-21 ENCOUNTER — Ambulatory Visit
Admission: RE | Admit: 2021-05-21 | Discharge: 2021-05-21 | Disposition: A | Discharge status: Home / Self Care | Payer: Medicare HMO | Source: Ambulatory Visit | Attending: Radiation Oncology | Admitting: Radiation Oncology

## 2021-05-21 ENCOUNTER — Encounter: Payer: Self-pay | Admitting: Radiation Oncology

## 2021-05-21 ENCOUNTER — Other Ambulatory Visit: Payer: Self-pay

## 2021-05-21 VITALS — BP 146/79 | HR 87 | Temp 96.3°F | Resp 18 | Ht 67.0 in | Wt 209.0 lb

## 2021-05-21 DIAGNOSIS — Z170 Estrogen receptor positive status [ER+]: Secondary | ICD-10-CM | POA: Insufficient documentation

## 2021-05-21 DIAGNOSIS — Z923 Personal history of irradiation: Secondary | ICD-10-CM | POA: Diagnosis not present

## 2021-05-21 DIAGNOSIS — C50211 Malignant neoplasm of upper-inner quadrant of right female breast: Secondary | ICD-10-CM | POA: Diagnosis not present

## 2021-05-21 DIAGNOSIS — Z79899 Other long term (current) drug therapy: Secondary | ICD-10-CM | POA: Diagnosis not present

## 2021-05-21 DIAGNOSIS — Z7984 Long term (current) use of oral hypoglycemic drugs: Secondary | ICD-10-CM | POA: Insufficient documentation

## 2021-05-21 NOTE — Progress Notes (Signed)
[name] presents today for follow-up after completing radiation to her right breast on 04/22/2021 ? ?Pain: Patient denies ?Skin: Reports mild lingering discoloration to breast, but reports her skin is intact and healing well (reports she did have a mild yeast infection under her right breast, but it resolved after using nystatin powder) ?ROM: Patient denies ?Lymphedema: Patient denies ?MedOnc F/U: Scheduled for F/U with Dr. Arletha Pili Iruku on 08/01/2021 ?Other issues of note: Patient reports she has started walking up to 2 miles a day for the past 2 weeks. Saw her PCP to address insomnia/anxiety concerns (plans to continue current recommendations for another month and then reassess if anything needs to be adjusted) ? ?Pt reports Yes No Comments  ?Tamoxifen []  [x]    ?Letrozole []  [x]    ?Anastrazole [x]  []  Started 05/05/21  ?Mammogram []  Date:  []    ? ? ?

## 2021-05-21 NOTE — Progress Notes (Signed)
?Radiation Oncology         [phone] ?________________________________ ? ?Name: [name] MRN: [id number]  ?Date: 05/21/2021  DOB: Mar 02, 1956 ? ?Follow-Up Visit Note ? ?Outpatient ? ?CC: Antony Contras, MD  Antony Contras, MD ? ?Diagnosis and Prior Radiotherapy:  ?  ICD-10-CM   ?1. Malignant neoplasm of upper-inner quadrant of right breast in female, estrogen receptor positive ([place])  C50.211   ? Z17.0   ?  ? ? ?pT1b, pN0  ? ?Intent: Curative ? ?Radiation Treatment Dates: 03/25/2021 through 04/22/2021 ?Site Technique Total Dose (Gy) Dose per Fx (Gy) Completed Fx Beam Energies  ?Breast, Right: Breast_R 3D 40.05/40.05 2.67 15/15 6X, 10X  ?Breast, Right: Breast_R_Bst specialPort 10/10 2 5/5 15E  ? ?CHIEF COMPLAINT: Here for follow-up and surveillance of breast cancer ? ?Narrative:  The patient returns today for routine follow-up.  [name] presents today for follow-up after completing radiation to her right breast on 04/22/2021 ? ?Pain: Patient denies ?Skin: Reports mild lingering discoloration to breast, but reports her skin is intact and healing well (reports she did have a mild yeast infection under her right breast, but it resolved after using anti yeast  powder) ?ROM: Patient denies ?Lymphedema: Patient denies ?MedOnc F/U: Scheduled for F/U with Dr. Arletha Pili Iruku on 08/01/2021 ?Other issues of note: Patient reports she has started walking up to 2 miles a day for the past 2 weeks. Saw her PCP to address insomnia/anxiety concerns (plans to continue current recommendations for another month and then reassess if anything needs to be adjusted) ? ?Pt reports Yes No Comments  ?Tamoxifen '[]'  '[x]'    ?Letrozole '[]'  '[x]'    ?Anastrazole '[x]'  '[]'  Started 05/05/21  ?Mammogram '[]'  Date:  '[]'    ? ?                             ? ?ALLERGIES:  is allergic to glimepiride. ? ?Meds: ?Current Outpatient Medications  ?Medication Sig Dispense Refill  ? ACCU-CHEK GUIDE test strip     ? acetaminophen (TYLENOL) 500 MG tablet Take 1,000 mg by  mouth every 6 (six) hours as needed for moderate pain.    ? amitriptyline (ELAVIL) 25 MG tablet Take 25 mg by mouth at bedtime.    ? anastrozole (ARIMIDEX) 1 MG tablet Take 1 tablet (1 mg total) by mouth daily. 90 tablet 3  ? Blood Glucose Monitoring Suppl (ACCU-CHEK GUIDE ME) w/Device KIT     ? Cholecalciferol (DIALYVITE VITAMIN D 5000) 125 MCG (5000 UT) capsule Take 5,000 Units by mouth daily.    ? fluconazole (DIFLUCAN) 150 MG tablet Take 1 tablet (150 mg total) by mouth daily. 30 tablet 0  ? ibuprofen (ADVIL) 200 MG tablet Take 400 mg by mouth every 6 (six) hours as needed for moderate pain or headache.    ? Insulin Syringe-Needle U-100 (INSULIN SYRINGE .5CC/30GX5/16") 30G X 5/16" 0.5 ML MISC See admin instructions.    ? lidocaine-prilocaine (EMLA) cream Apply to affected area once (Patient taking differently: Apply 1 application topically. Apply to affected area once) 30 g 3  ? LORazepam (ATIVAN) 0.5 MG tablet Take 1 tablet (0.5 mg total) by mouth at bedtime as needed (Nausea or vomiting). 30 tablet 0  ? losartan (COZAAR) 25 MG tablet Take 25 mg by mouth daily.    ? metFORMIN (GLUCOPHAGE) 500 MG tablet Take 1,000 mg by mouth 2 (two) times daily.    ? Multiple Vitamin (MULTIVITAMIN WITH MINERALS) TABS tablet  Take 1 tablet by mouth daily.    ? nystatin (MYCOSTATIN/NYSTOP) powder Apply 1 application topically 3 (three) times daily. 15 g 0  ? Probiotic Product (PROBIOTIC PO) Take 1 capsule by mouth daily.    ? prochlorperazine (COMPAZINE) 10 MG tablet Take 1 tablet (10 mg total) by mouth every 6 (six) hours as needed (Nausea or vomiting). 30 tablet 1  ? sertraline (ZOLOFT) 50 MG tablet Take 50 mg by mouth daily.    ? simvastatin (ZOCOR) 40 MG tablet Take 40 mg by mouth at bedtime.    ? TOUJEO SOLOSTAR 300 UNIT/ML Solostar Pen Inject 45 Units into the skin daily.    ? traMADol (ULTRAM) 50 MG tablet Take 1-2 tablets (50-100 mg total) by mouth every 6 (six) hours as needed for moderate pain. 30 tablet 0  ? ?No current  facility-administered medications for this encounter.  ? ? ?Physical Findings: ?The patient is in no acute distress. Patient is alert and oriented. ? height is '5\' 7"'  (1.702 m) and weight is 209 lb (94.8 kg). Her temporal temperature is 96.3 ?F (35.7 ?C) (abnormal). Her blood pressure is 146/79 (abnormal) and her pulse is 87. Her respiration is 18 and oxygen saturation is 99%. Marland Kitchen    ?Satisfactory skin healing in radiotherapy fields. Skin is intact with some cont'd hyperpigmentation over right breast ? ? ?Lab Findings: ?Lab Results  ?Component Value Date  ? WBC 5.1 05/05/2021  ? HGB 11.5 (L) 05/05/2021  ? HCT 35.7 (L) 05/05/2021  ? MCV 90.8 05/05/2021  ? PLT 190 05/05/2021  ? ? ?Radiographic Findings: ?No results found. ? ?Impression/Plan: Healing well from radiotherapy to the breast tissue. ? ?Continue skin care with topical Vitamin E Oil and / or lotion for at least 2 more months for further healing. ? ?I encouraged her to continue with yearly mammography as appropriate (for intact breast tissue) and followup with medical oncology. I will see her back on an as-needed basis. I have encouraged her to call if she has any issues or concerns in the future. I wished her the very best. ? ?On date of service, in total, I spent 20 minutes on this encounter. Patient was seen in person.  ?_____________________________________ ? ? ?Eppie Gibson, MD  ?

## 2021-05-29 ENCOUNTER — Telehealth: Payer: Self-pay | Admitting: *Deleted

## 2021-06-02 ENCOUNTER — Telehealth: Payer: Self-pay | Admitting: *Deleted

## 2021-06-02 ENCOUNTER — Ambulatory Visit: Payer: Medicare HMO

## 2021-06-16 ENCOUNTER — Other Ambulatory Visit: Payer: Self-pay

## 2021-06-16 ENCOUNTER — Ambulatory Visit: Payer: Medicare HMO | Attending: General Surgery

## 2021-06-16 VITALS — Wt 210.0 lb

## 2021-06-16 DIAGNOSIS — Z483 Aftercare following surgery for neoplasm: Secondary | ICD-10-CM | POA: Insufficient documentation

## 2021-06-16 NOTE — Therapy (Signed)
?  OUTPATIENT PHYSICAL THERAPY SOZO SCREENING NOTE ? ? ?Patient Name: [name] ?MRN: [id number] ?DOB:[birth year], 66 y.o., female ?Today's Date: 06/16/2021 ? ?PCP: Antony Contras, MD ?REFERRING PROVIDER: Rolm Bookbinder, MD ? ? PT End of Session - 06/16/21 1634   ? ? Visit Number 3   # unchanged due to screen only  ? PT Start Time 0277   ? PT Stop Time 4128   ? PT Time Calculation (min) 6 min   ? Activity Tolerance Patient tolerated treatment well   ? Behavior During Therapy [facility] for tasks assessed/performed   ? ?  ?  ? ?  ? ? ?Past Medical History:  ?Diagnosis Date  ? Cancer [facility])   ? Cataract   ? removed  ? Diabetes mellitus without complication ([place])   ? History of kidney stones   ? Hyperlipidemia   ? Hypertension   ? PONV (postoperative nausea and vomiting)   ? ?Past Surgical History:  ?Procedure Laterality Date  ? ABDOMINAL HYSTERECTOMY    ? BREAST LUMPECTOMY Right 01/09/2021  ? Procedure: incision drainage right breast abscess;  Surgeon: Rolm Bookbinder, MD;  Location: [facility];  Service: General;  Laterality: Right;  ? BREAST LUMPECTOMY WITH RADIOACTIVE SEED AND SENTINEL LYMPH NODE BIOPSY Right 11/05/2020  ? Procedure: RIGHT BREAST LUMPECTOMY WITH RADIOACTIVE SEED AND RIGHT AXILLARY SENTINEL LYMPH NODE BIOPSY;  Surgeon: Rolm Bookbinder, MD;  Location: [place];  Service: General;  Laterality: Right;  ? CHOLECYSTECTOMY    ? KNEE SURGERY    ? PORTACATH PLACEMENT Right 12/30/2020  ? Procedure: INSERTION PORT-A-CATH;  Surgeon: Rolm Bookbinder, MD;  Location: [place];  Service: General;  Laterality: Right;  ? ?Patient Active Problem List  ? Diagnosis Date Noted  ? Port-A-Cath in place 01/23/2021  ? Uncontrolled type 2 diabetes mellitus with hyperglycemia ([place]) 01/07/2021  ? SIRS (systemic inflammatory response syndrome) ([place]) 01/06/2021  ? Malignant neoplasm of upper-inner quadrant of right breast in female, estrogen receptor positive ([place]) 10/14/2020  ? ? ?REFERRING DIAG: right breast cancer  at risk for lymphedema ? ?THERAPY DIAG:  ?Aftercare following surgery for neoplasm ? ?PERTINENT HISTORY: Patient was diagnosed on 09/30/2020 with right grade I invasive lobular carcinoma breast cancer. She underwent a right lumpectomy and sentinel node biopsy (2 negative nodes) on 11/05/2020. It is ER positive, PR negative and HER2 negative with a Ki67 of 10%. Will be starting chemotherapy and then radiation. ? ?PRECAUTIONS: right UE Lymphedema risk, None ? ?SUBJECTIVE: Pt returns for her 3 month L-Dex screen.  ? ?PAIN:  ?Are you having pain? No ? ?SOZO SCREENING: ?Patient was assessed today using the SOZO machine to determine the lymphedema index score. This was compared to her baseline score. It was determined that she is within the recommended range when compared to her baseline and no further action is needed at this time. She will continue SOZO screenings. These are done every 3 months for 2 years post operatively followed by every 6 months for 2 years, and then annually. ? ? ? ? ?Otelia Limes, PTA ?06/16/2021, 4:41 PM ? ?  ? ?

## 2021-06-25 DIAGNOSIS — E1169 Type 2 diabetes mellitus with other specified complication: Secondary | ICD-10-CM | POA: Diagnosis not present

## 2021-06-25 DIAGNOSIS — I10 Essential (primary) hypertension: Secondary | ICD-10-CM | POA: Diagnosis not present

## 2021-06-25 DIAGNOSIS — G4700 Insomnia, unspecified: Secondary | ICD-10-CM | POA: Diagnosis not present

## 2021-06-25 DIAGNOSIS — E782 Mixed hyperlipidemia: Secondary | ICD-10-CM | POA: Diagnosis not present

## 2021-06-30 DIAGNOSIS — E7800 Pure hypercholesterolemia, unspecified: Secondary | ICD-10-CM | POA: Diagnosis not present

## 2021-06-30 DIAGNOSIS — N200 Calculus of kidney: Secondary | ICD-10-CM | POA: Diagnosis not present

## 2021-06-30 DIAGNOSIS — E1165 Type 2 diabetes mellitus with hyperglycemia: Secondary | ICD-10-CM | POA: Diagnosis not present

## 2021-06-30 DIAGNOSIS — C50919 Malignant neoplasm of unspecified site of unspecified female breast: Secondary | ICD-10-CM | POA: Diagnosis not present

## 2021-06-30 DIAGNOSIS — G629 Polyneuropathy, unspecified: Secondary | ICD-10-CM | POA: Diagnosis not present

## 2021-06-30 DIAGNOSIS — I10 Essential (primary) hypertension: Secondary | ICD-10-CM | POA: Diagnosis not present

## 2021-07-09 ENCOUNTER — Encounter (HOSPITAL_COMMUNITY): Payer: Self-pay

## 2021-07-09 DIAGNOSIS — Z86010 Personal history of colonic polyps: Secondary | ICD-10-CM | POA: Diagnosis not present

## 2021-07-09 DIAGNOSIS — R1310 Dysphagia, unspecified: Secondary | ICD-10-CM | POA: Diagnosis not present

## 2021-07-09 DIAGNOSIS — K644 Residual hemorrhoidal skin tags: Secondary | ICD-10-CM | POA: Diagnosis not present

## 2021-08-01 ENCOUNTER — Other Ambulatory Visit: Payer: Self-pay

## 2021-08-01 ENCOUNTER — Encounter: Payer: Self-pay | Admitting: Hematology and Oncology

## 2021-08-01 ENCOUNTER — Inpatient Hospital Stay: Payer: Medicare HMO | Attending: Oncology | Admitting: Hematology and Oncology

## 2021-08-01 VITALS — BP 158/74 | HR 88 | Temp 97.3°F | Resp 16 | Wt 207.5 lb

## 2021-08-01 DIAGNOSIS — Z7984 Long term (current) use of oral hypoglycemic drugs: Secondary | ICD-10-CM | POA: Diagnosis not present

## 2021-08-01 DIAGNOSIS — Z923 Personal history of irradiation: Secondary | ICD-10-CM | POA: Diagnosis not present

## 2021-08-01 DIAGNOSIS — R110 Nausea: Secondary | ICD-10-CM | POA: Diagnosis not present

## 2021-08-01 DIAGNOSIS — E119 Type 2 diabetes mellitus without complications: Secondary | ICD-10-CM | POA: Insufficient documentation

## 2021-08-01 DIAGNOSIS — C50211 Malignant neoplasm of upper-inner quadrant of right female breast: Secondary | ICD-10-CM

## 2021-08-01 DIAGNOSIS — Z170 Estrogen receptor positive status [ER+]: Secondary | ICD-10-CM

## 2021-08-01 DIAGNOSIS — R634 Abnormal weight loss: Secondary | ICD-10-CM | POA: Insufficient documentation

## 2021-08-01 DIAGNOSIS — Z9221 Personal history of antineoplastic chemotherapy: Secondary | ICD-10-CM | POA: Insufficient documentation

## 2021-08-01 DIAGNOSIS — Z79811 Long term (current) use of aromatase inhibitors: Secondary | ICD-10-CM | POA: Insufficient documentation

## 2021-08-01 DIAGNOSIS — Z79899 Other long term (current) drug therapy: Secondary | ICD-10-CM | POA: Insufficient documentation

## 2021-08-01 NOTE — Progress Notes (Signed)
?[name]  ?[phone] [phone]  ? ? ? ?ID: [name] DOB: 29-Dec-1955  MR#: [id number]  ZHY#:[id number] ? ?Patient Care Team: ?Antony Contras, MD as PCP - General (Family Medicine) ?Mauro Kaufmann, RN as Oncology Nurse Navigator ?Rockwell Germany, RN as Oncology Nurse Navigator ?Rolm Bookbinder, MD as Consulting Physician (General Surgery) ?Magrinat, Virgie Dad, MD (Inactive) as Consulting Physician (Oncology) ?Eppie Gibson, MD as Attending Physician (Radiation Oncology) ?Arvella Nigh, MD as Consulting Physician (Obstetrics and Gynecology) ?Jacelyn Pi, MD as Referring Physician (Endocrinology) ?Benay Pike, MD ?OTHER MD: ? ?CHIEF COMPLAINT: Estrogen receptor positive breast cancer ? ?CURRENT TREATMENT: Adjuvant chemotherapy ? ?INTERVAL HISTORY: ? ?[name] returns today for follow up with her husband and daughters for follow up of her estrogen receptor positive breast cancer.  ?She completed 4 cycles of adjuvant TC, last cycle completed on 03/03/2021 ?She is now on antiestrogen therapy with anastrozole, tolerating it very well.  She was recently started on Ozempic for diabetes and has lost about 9 pounds of weight, this medicine makes her very nauseous and makes her lose her appetite.  She although has not been feeling well with the nausea appreciates the weight loss from the medication.  Besides this, she has noticed some arthralgias likely related to anastrozole.  She is also going to have an endoscopy and colonoscopy because of family history of colon cancer and she also had 2 or 3 choking episodes recently and wondered if radiation might be causing some strictures or difficulty swallowing.  She will however keep [place] posted with the endoscopy results.  She will proceed with mammogram in July.  She had her last bone density in June 2022.  Rest of the pertinent 10 point ROS reviewed and negative ? ?REVIEW OF SYSTEMS: ?  ? ? A detailed review of systems today was  otherwise stable. ? ?COVID 19 VACCINATION STATUS: Pfizer x3 as of July 2022 ? ? ?HISTORY OF CURRENT ILLNESS: ? ?From the original intake note: ? ?[name] had routine screening mammography on 09/04/2020 showing a possible abnormality in the right breast. She underwent right diagnostic mammography with tomography and right breast ultrasonography at [facility] on 09/30/2020 showing: breast density category B; 0.7 cm mass in right breast at 12:30; normal right axillary lymph nodes. ? ?Accordingly on 10/04/2020 she proceeded to biopsy of the right breast area in question. The pathology from this procedure ([id number]) showed: invasive mammary carcinoma, e-cadherin negative, grade 1; atypical lobular hyperplasia. Prognostic indicators significant for: estrogen receptor, 90% positive with moderate staining intensity and progesterone receptor, 0% negative. Proliferation marker Ki67 at 10%. HER2 negative by immunohistochemistry (1+). ? ? Cancer Staging  ?Malignant neoplasm of upper-inner quadrant of right breast in female, estrogen receptor positive ([place]) ?Staging form: Breast, AJCC 8th Edition ?- Clinical stage from 10/16/2020: Stage IA (cT1b, cN0, cM0, G1, ER+, PR-, HER2-) - Signed by [name], MD on 10/16/2020 ?Stage prefix: Initial diagnosis ?Histologic grading system: 3 grade system ?Laterality: Right ?Staged by: Pathologist and managing physician ?Stage used in treatment planning: Yes ?National guidelines used in treatment planning: Yes ?Type of national guideline used in treatment planning: NCCN ? ? ?The patient's subsequent history is as detailed below. ? ? ?PAST MEDICAL HISTORY: ?Past Medical History:  ?Diagnosis Date  ? Cancer [facility])   ? Cataract   ? removed  ? Diabetes mellitus without complication ([place])   ? History of kidney stones   ? Hyperlipidemia   ? Hypertension   ?  PONV (postoperative nausea and vomiting)   ? ? ?PAST SURGICAL HISTORY: ?Past Surgical History:  ?Procedure Laterality Date  ?  ABDOMINAL HYSTERECTOMY    ? BREAST LUMPECTOMY Right 01/09/2021  ? Procedure: incision drainage right breast abscess;  Surgeon: Rolm Bookbinder, MD;  Location: [facility];  Service: General;  Laterality: Right;  ? BREAST LUMPECTOMY WITH RADIOACTIVE SEED AND SENTINEL LYMPH NODE BIOPSY Right 11/05/2020  ? Procedure: RIGHT BREAST LUMPECTOMY WITH RADIOACTIVE SEED AND RIGHT AXILLARY SENTINEL LYMPH NODE BIOPSY;  Surgeon: Rolm Bookbinder, MD;  Location: [place];  Service: General;  Laterality: Right;  ? CHOLECYSTECTOMY    ? KNEE SURGERY    ? PORTACATH PLACEMENT Right 12/30/2020  ? Procedure: INSERTION PORT-A-CATH;  Surgeon: Rolm Bookbinder, MD;  Location: [place];  Service: General;  Laterality: Right;  ? ? ?FAMILY HISTORY: ?Family History  ?Problem Relation Age of Onset  ? Heart attack Mother   ? Diabetes Father   ? Colon cancer Maternal Grandmother   ? Breast cancer Cousin   ?     dx early 58's  ? Her father died at age 15 from diabetes complications. Her mother died at age 63 from MI. [name] has two brothers (and no sisters). She reports breast cancer in a maternal cousin in her early 41's and colon cancer in a maternal grandmother. ? ? ?GYNECOLOGIC HISTORY:  ?No LMP recorded. Patient has had a hysterectomy. ?Menarche: 66 years old ?Age at first live birth: 66 years old ?GX P 3 ?LMP 02/2000 ?Contraceptive: used from 74 ?HRT never used  ?Hysterectomy? Yes, 02/2000 ?BSO? yes ? ? 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 Husband [name] is a retired Radiographer, therapeutic. Son [name], age 69, is a Geophysicist/field seismologist and youth pastor in [place]. Daughter [name], age 65, is a high school principal in [place]. Daughter [name], age 42, is a hair stylist here in [place]. [name] has two grandchildren. She attends a [employer]. ?  ? ADVANCED DIRECTIVES: In the absence of any  documentation to the contrary, the patient's spouse is their HCPOA.  ? ? ?HEALTH MAINTENANCE: ?Social History  ? ?Tobacco Use  ? Smoking status: Former  ?  Packs/day: 0.50  ?  Years: 10.00  ?  Pack years: 5.00  ?  Types: Cigarettes  ?  Quit date: 09/25/1981  ?  Years since quitting: 39.8  ? Smokeless tobacco: Never  ?Vaping Use  ? Vaping Use: Never used  ?Substance Use Topics  ? Alcohol use: Never  ? Drug use: Never  ? ? ? Colonoscopy: 2018 (Dr. Earlean Shawl) ? PAP: 08/2020 ? Bone density: 08/2020 ?  ?Allergies  ?Allergen Reactions  ? Glimepiride   ?  Hypoglycemia   ? ? ?Current Outpatient Medications  ?Medication Sig Dispense Refill  ? ACCU-CHEK GUIDE test strip     ? acetaminophen (TYLENOL) 500 MG tablet Take 1,000 mg by mouth every 6 (six) hours as needed for moderate pain.    ? amitriptyline (ELAVIL) 25 MG tablet Take 25 mg by mouth at bedtime.    ? anastrozole (ARIMIDEX) 1 MG tablet Take 1 tablet (1 mg total) by mouth daily. 90 tablet 3  ? Blood Glucose Monitoring Suppl (ACCU-CHEK GUIDE ME) w/Device KIT     ? Cholecalciferol (DIALYVITE VITAMIN D 5000) 125 MCG (5000 UT) capsule Take 5,000 Units by mouth daily.    ? fluconazole (  DIFLUCAN) 150 MG tablet Take 1 tablet (150 mg total) by mouth daily. 30 tablet 0  ? ibuprofen (ADVIL) 200 MG tablet Take 400 mg by mouth every 6 (six) hours as needed for moderate pain or headache.    ? Insulin Syringe-Needle U-100 (INSULIN SYRINGE .5CC/30GX5/16") 30G X 5/16" 0.5 ML MISC See admin instructions.    ? lidocaine-prilocaine (EMLA) cream Apply to affected area once (Patient taking differently: Apply 1 application topically. Apply to affected area once) 30 g 3  ? LORazepam (ATIVAN) 0.5 MG tablet Take 1 tablet (0.5 mg total) by mouth at bedtime as needed (Nausea or vomiting). 30 tablet 0  ? losartan (COZAAR) 25 MG tablet Take 25 mg by mouth daily.    ? metFORMIN (GLUCOPHAGE) 500 MG tablet Take 1,000 mg by mouth 2 (two) times daily.    ? Multiple Vitamin (MULTIVITAMIN WITH MINERALS) TABS  tablet Take 1 tablet by mouth daily.    ? nystatin (MYCOSTATIN/NYSTOP) powder Apply 1 application topically 3 (three) times daily. 15 g 0  ? Probiotic Product (PROBIOTIC PO) Take 1 capsule by mouth daily.    ? proc

## 2021-08-05 DIAGNOSIS — K2950 Unspecified chronic gastritis without bleeding: Secondary | ICD-10-CM | POA: Diagnosis not present

## 2021-08-05 DIAGNOSIS — K5289 Other specified noninfective gastroenteritis and colitis: Secondary | ICD-10-CM | POA: Diagnosis not present

## 2021-08-05 DIAGNOSIS — B3781 Candidal esophagitis: Secondary | ICD-10-CM | POA: Diagnosis not present

## 2021-08-05 DIAGNOSIS — K2930 Chronic superficial gastritis without bleeding: Secondary | ICD-10-CM | POA: Diagnosis not present

## 2021-08-05 DIAGNOSIS — K5730 Diverticulosis of large intestine without perforation or abscess without bleeding: Secondary | ICD-10-CM | POA: Diagnosis not present

## 2021-08-05 DIAGNOSIS — R1310 Dysphagia, unspecified: Secondary | ICD-10-CM | POA: Diagnosis not present

## 2021-08-05 DIAGNOSIS — D122 Benign neoplasm of ascending colon: Secondary | ICD-10-CM | POA: Diagnosis not present

## 2021-08-05 DIAGNOSIS — Z86010 Personal history of colonic polyps: Secondary | ICD-10-CM | POA: Diagnosis not present

## 2021-08-05 DIAGNOSIS — B9681 Helicobacter pylori [H. pylori] as the cause of diseases classified elsewhere: Secondary | ICD-10-CM | POA: Diagnosis not present

## 2021-08-05 DIAGNOSIS — Z09 Encounter for follow-up examination after completed treatment for conditions other than malignant neoplasm: Secondary | ICD-10-CM | POA: Diagnosis not present

## 2021-08-05 DIAGNOSIS — K529 Noninfective gastroenteritis and colitis, unspecified: Secondary | ICD-10-CM | POA: Diagnosis not present

## 2021-08-05 DIAGNOSIS — K6389 Other specified diseases of intestine: Secondary | ICD-10-CM | POA: Diagnosis not present

## 2021-08-12 ENCOUNTER — Other Ambulatory Visit: Payer: Self-pay | Admitting: Internal Medicine

## 2021-08-12 DIAGNOSIS — D122 Benign neoplasm of ascending colon: Secondary | ICD-10-CM | POA: Diagnosis not present

## 2021-08-12 DIAGNOSIS — B9681 Helicobacter pylori [H. pylori] as the cause of diseases classified elsewhere: Secondary | ICD-10-CM | POA: Diagnosis not present

## 2021-08-12 DIAGNOSIS — K2930 Chronic superficial gastritis without bleeding: Secondary | ICD-10-CM | POA: Diagnosis not present

## 2021-08-12 DIAGNOSIS — K5289 Other specified noninfective gastroenteritis and colitis: Secondary | ICD-10-CM | POA: Diagnosis not present

## 2021-08-12 DIAGNOSIS — B3781 Candidal esophagitis: Secondary | ICD-10-CM | POA: Diagnosis not present

## 2021-08-13 ENCOUNTER — Ambulatory Visit (HOSPITAL_COMMUNITY)
Admission: RE | Admit: 2021-08-13 | Discharge: 2021-08-13 | Disposition: A | Discharge status: Home / Self Care | Payer: Medicare HMO | Source: Ambulatory Visit | Attending: Hematology and Oncology | Admitting: Hematology and Oncology

## 2021-08-13 DIAGNOSIS — E785 Hyperlipidemia, unspecified: Secondary | ICD-10-CM | POA: Insufficient documentation

## 2021-08-13 DIAGNOSIS — C50211 Malignant neoplasm of upper-inner quadrant of right female breast: Secondary | ICD-10-CM | POA: Insufficient documentation

## 2021-08-13 DIAGNOSIS — E119 Type 2 diabetes mellitus without complications: Secondary | ICD-10-CM | POA: Diagnosis not present

## 2021-08-13 DIAGNOSIS — I10 Essential (primary) hypertension: Secondary | ICD-10-CM | POA: Diagnosis not present

## 2021-08-13 DIAGNOSIS — Z170 Estrogen receptor positive status [ER+]: Secondary | ICD-10-CM | POA: Insufficient documentation

## 2021-08-13 DIAGNOSIS — Z452 Encounter for adjustment and management of vascular access device: Secondary | ICD-10-CM | POA: Diagnosis not present

## 2021-08-13 HISTORY — PX: IR REMOVAL TUN ACCESS W/ PORT W/O FL MOD SED: IMG2290

## 2021-08-13 LAB — GLUCOSE, CAPILLARY: Glucose-Capillary: 108 mg/dL — ABNORMAL HIGH (ref 70–99)

## 2021-08-13 MED ORDER — MIDAZOLAM HCL 2 MG/2ML IJ SOLN
INTRAMUSCULAR | Status: AC
  Filled 2021-08-13: qty 2

## 2021-08-13 MED ORDER — LIDOCAINE-EPINEPHRINE 1 %-1:100000 IJ SOLN
INTRAMUSCULAR | Status: AC
  Filled 2021-08-13: qty 1

## 2021-08-13 MED ORDER — MIDAZOLAM HCL 2 MG/2ML IJ SOLN
INTRAMUSCULAR | Status: AC | PRN
  Administered 2021-08-13: 1 mg via INTRAVENOUS

## 2021-08-13 MED ORDER — LIDOCAINE HCL 1 % IJ SOLN
INTRAMUSCULAR | Status: AC
Start: 2021-08-13 — End: 2021-08-13
  Filled 2021-08-13: qty 20

## 2021-08-13 MED ORDER — SODIUM CHLORIDE 0.9 % IV SOLN: INTRAVENOUS | Status: DC

## 2021-08-13 MED ORDER — FENTANYL CITRATE (PF) 100 MCG/2ML IJ SOLN
INTRAMUSCULAR | Status: AC | PRN
  Administered 2021-08-13: 50 ug via INTRAVENOUS

## 2021-08-13 MED ORDER — FENTANYL CITRATE (PF) 100 MCG/2ML IJ SOLN
INTRAMUSCULAR | Status: AC
  Filled 2021-08-13: qty 2

## 2021-08-13 MED ORDER — LIDOCAINE HCL (PF) 1 % IJ SOLN
INTRAMUSCULAR | Status: AC | PRN
  Administered 2021-08-13: 10 mL via INTRADERMAL

## 2021-08-13 MED ORDER — MIDAZOLAM HCL 2 MG/2ML IJ SOLN
INTRAMUSCULAR | Status: AC | PRN
Start: 2021-08-13 — End: 2021-08-13
  Administered 2021-08-13: 1 mg via INTRAVENOUS

## 2021-08-13 NOTE — Consult Note (Signed)
Chief Complaint: Patient was seen in consultation today for No chief complaint on file.  at the request of [place]  Referring Physician(s): Iruku,Praveena  Supervising Physician: Delma Post  Patient Status: [facility] - Out-pt  History of Present Illness: [name] is a 66 y.o. female with past medical history of diabetes, nephrolithiasis, hyperlipidemia, hypertension, and right breast cancer in 2022 with prior lumpectomy with radiation seed implant and chemoradiation.  She had right chest wall Port-A-Cath placed by [facility] on 12/30/2020. She has now completed treatment and presents today for Port-A-Cath removal.  Past Medical History:  Diagnosis Date   Cancer ([place])    Cataract    removed   Diabetes mellitus without complication ([place])    History of kidney stones    Hyperlipidemia    Hypertension    PONV (postoperative nausea and vomiting)     Past Surgical History:  Procedure Laterality Date   ABDOMINAL HYSTERECTOMY     BREAST LUMPECTOMY Right 01/09/2021   Procedure: incision drainage right breast abscess;  Surgeon: Rolm Bookbinder, MD;  Location: [facility];  Service: General;  Laterality: Right;   BREAST LUMPECTOMY WITH RADIOACTIVE SEED AND SENTINEL LYMPH NODE BIOPSY Right 11/05/2020   Procedure: RIGHT BREAST LUMPECTOMY WITH RADIOACTIVE SEED AND RIGHT AXILLARY SENTINEL LYMPH NODE BIOPSY;  Surgeon: Rolm Bookbinder, MD;  Location: [place];  Service: General;  Laterality: Right;   CHOLECYSTECTOMY     KNEE SURGERY     PORTACATH PLACEMENT Right 12/30/2020   Procedure: INSERTION PORT-A-CATH;  Surgeon: Rolm Bookbinder, MD;  Location: [place];  Service: General;  Laterality: Right;    Allergies: Glimepiride  Medications: Prior to Admission medications   Medication Sig Start Date End Date Taking? Authorizing Provider  ACCU-CHEK GUIDE test strip  07/23/20  Yes [provider]  acetaminophen (TYLENOL) 500 MG tablet Take 1,000 mg by mouth every 6  (six) hours as needed for moderate pain.   Yes [provider]  amitriptyline (ELAVIL) 25 MG tablet Take 25 mg by mouth at bedtime.   Yes [provider]  anastrozole (ARIMIDEX) 1 MG tablet Take 1 tablet (1 mg total) by mouth daily. 05/05/21  Yes Benay Pike, MD  Blood Glucose Monitoring Suppl (ACCU-CHEK GUIDE ME) w/Device KIT  07/23/20  Yes [provider]  Cholecalciferol (DIALYVITE VITAMIN D 5000) 125 MCG (5000 UT) capsule Take 5,000 Units by mouth daily.   Yes [provider]  fluconazole (DIFLUCAN) 150 MG tablet Take 1 tablet (150 mg total) by mouth daily. 01/21/21  Yes Causey, Charlestine Massed, NP  ibuprofen (ADVIL) 200 MG tablet Take 400 mg by mouth every 6 (six) hours as needed for moderate pain or headache.   Yes [provider]  Insulin Syringe-Needle U-100 (INSULIN SYRINGE .5CC/30GX5/16") 30G X 5/16" 0.5 ML MISC See admin instructions. 04/23/20  Yes [provider]  lidocaine-prilocaine (EMLA) cream Apply to affected area once Patient taking differently: Apply 1 application. topically. Apply to affected area once 12/11/20  Yes Magrinat, Virgie Dad, MD  LORazepam (ATIVAN) 0.5 MG tablet Take 1 tablet (0.5 mg total) by mouth at bedtime as needed (Nausea or vomiting). 02/11/21  Yes Causey, Charlestine Massed, NP  losartan (COZAAR) 25 MG tablet Take 25 mg by mouth daily.   Yes [provider]  metFORMIN (GLUCOPHAGE) 500 MG tablet Take 1,000 mg by mouth 2 (two) times daily.   Yes [provider]  Multiple Vitamin (MULTIVITAMIN WITH MINERALS) TABS tablet Take 1 tablet by mouth daily.   Yes [provider]  nystatin (MYCOSTATIN/NYSTOP) powder Apply 1 application topically 3 (three) times daily. 05/05/21  Yes Benay Pike, MD  Probiotic Product (PROBIOTIC PO) Take 1 capsule by mouth daily.   Yes [provider]  prochlorperazine (COMPAZINE) 10 MG tablet Take 1 tablet (10 mg total) by mouth every 6 (six) hours as  needed (Nausea or vomiting). 12/11/20  Yes Magrinat, Virgie Dad, MD  sertraline (ZOLOFT) 50 MG tablet Take 50 mg by mouth daily.   Yes [provider]  simvastatin (ZOCOR) 40 MG tablet Take 40 mg by mouth at bedtime.   Yes [provider]  TOUJEO SOLOSTAR 300 UNIT/ML Solostar Pen Inject 45 Units into the skin daily. 07/22/20  Yes [provider]  traMADol (ULTRAM) 50 MG tablet Take 1-2 tablets (50-100 mg total) by mouth every 6 (six) hours as needed for moderate pain. 01/13/21   Saverio Danker, PA-C     Family History  Problem Relation Age of Onset   Heart attack Mother    Diabetes Father    Colon cancer Maternal Grandmother    Breast cancer Cousin        dx early 29's    Social History   Socioeconomic History   Marital status: Married    Spouse name: Not on file   Number of children: Not on file   Years of education: Not on file   Highest education level: Not on file  Occupational History   Not on file  Tobacco Use   Smoking status: Former    Packs/day: 0.50    Years: 10.00    Pack years: 5.00    Types: Cigarettes    Quit date: 09/25/1981    Years since quitting: 39.9   Smokeless tobacco: Never  Vaping Use   Vaping Use: Never used  Substance and Sexual Activity   Alcohol use: Never   Drug use: Never   Sexual activity: Yes    Birth control/protection: Surgical  Other Topics Concern   Not on file  Social History Narrative   Not on file   Social Determinants of Health   Financial Resource Strain: Medium Risk   Difficulty of Paying Living Expenses: Somewhat hard  Food Insecurity: No Food Insecurity   Worried About Charity fundraiser in the Last Year: Never true   Ran Out of Food in the Last Year: Never true  Transportation Needs: No Transportation Needs   Lack of Transportation (Medical): No   Lack of Transportation (Non-Medical): No  Physical Activity: Not on file  Stress: Not on file  Social Connections: Not on file      Review of  Systems denies fever, chest pain, dyspnea, cough, abdominal/back pain, nausea, vomiting or bleeding.  She does have occasional headaches.  Vital Signs: BP (!) 147/86   Temp 98.1 F (36.7 C) (Oral)   Resp 18   SpO2 98%   Physical Exam awake, alert.  Chest clear to auscultation bilaterally.  Clean, intact right chest wall Port-A-Cath.  Heart with regular rate and rhythm.  Abdomen obese, soft, positive bowel sounds, nontender.  No lower extremity edema  Imaging: No results found.  Labs:  CBC: Recent Labs    01/21/21 0754 02/11/21 0814 03/03/21 0756 05/05/21 1050  WBC 9.9 7.0 6.0 5.1  HGB 11.4* 10.9* 11.0* 11.5*  HCT 35.5* 33.6* 34.1* 35.7*  PLT 554* 268 242 190    COAGS: No results for input(s): INR, APTT in the last 8760 hours.  BMP: Recent Labs  01/21/21 0754 02/11/21 0814 03/03/21 0756 05/05/21 1050  NA 135 139 140 138  K 4.9 4.2 4.2 4.1  CL 99 108 106 102  CO2 '22 22 22 28  ' GLUCOSE 288* 171* 188* 158*  BUN '14 11 10 8  ' CALCIUM 9.7 8.6* 8.6* 9.4  CREATININE 0.85 0.67 0.74 0.74  GFRNONAA >60 >60 >60 >60    LIVER FUNCTION TESTS: Recent Labs    01/21/21 0754 02/11/21 0814 03/03/21 0756 05/05/21 1050  BILITOT 0.4 <0.2* 0.4 0.3  AST '22 19 19 21  ' ALT 34 '19 19 21  ' ALKPHOS 69 73 67 71  PROT 7.9 7.1 6.9 6.8  ALBUMIN 3.8 3.7 3.7 4.0    TUMOR MARKERS: No results for input(s): AFPTM, CEA, CA199, CHROMGRNA in the last 8760 hours.  Assessment and Plan: 65 y.o. female with past medical history of diabetes, nephrolithiasis, hyperlipidemia, hypertension, and right breast cancer in 2022 with prior lumpectomy with radiation seed implant and chemoradiation.  She had right chest wall Port-A-Cath placed by [facility] on 12/30/2020. She has now completed treatment and presents today for Port-A-Cath removal. Details/risks of port a cath removal including, but not limited to bleeding, infection, injury to adjacent structures discussed with patient with her understanding and  consent.     Thank you for this interesting consult.  I greatly enjoyed meeting [name] and look forward to participating in their care.  A copy of this report was sent to the requesting provider on this date.  Electronically Signed: D. Rowe Robert, PA-C 08/13/2021, 11:11 AM   I spent a total of  20 minutes   in face to face in clinical consultation, greater than 50% of which was counseling/coordinating care for Port-A-Cath removal

## 2021-08-13 NOTE — Procedures (Signed)
  Procedure:  Rt sided chest port  removal Preprocedure diagnosis: Completed chemo Postprocedure diagnosis: same EBL:    minimal Complications:   none immediate  See full dictation in [employer].  Frazier Richards, MD Main # [phone] Mobile [phone]

## 2021-08-13 NOTE — Discharge Instructions (Signed)
Please call Interventional Radiology clinic [phone] with any questions or concerns.  You may remove your dressing and shower tomorrow.   Implanted Port Removal, Care After This sheet gives you information about how to care for yourself after your procedure. Your health care provider may also give you more specific instructions. If you have problems or questions, contact your health care provider. What can I expect after the procedure? After the procedure, it is common to have: Soreness or pain near your incision. Some swelling or bruising near your incision. Follow these instructions at home: Medicines Take over-the-counter and prescription medicines only as told by your health care provider. If you were prescribed an antibiotic medicine, take it as told by your health care provider. Do not stop taking the antibiotic even if you start to feel better. Bathing Do not take baths, swim, or use a hot tub until your health care provider approves. Ask your health care provider if you can take showers. You may only be allowed to take sponge baths. Incision care Follow instructions from your health care provider about how to take care of your incision. Make sure you: Wash your hands with soap and water before you change your bandage (dressing). If soap and water are not available, use hand sanitizer. Change your dressing as told by your health care provider. Keep your dressing dry. Leave stitches (sutures), skin glue, or adhesive strips in place. These skin closures may need to stay in place for 2 weeks or longer. If adhesive strip edges start to loosen and curl up, you may trim the loose edges. Do not remove adhesive strips completely unless your health care provider tells you to do that. Check your incision area every day for signs of infection. Check for: More redness, swelling, or pain. More fluid or blood. Warmth. Pus or a bad smell.    Driving Do not drive for 24 hours if you were  given a medicine to help you relax (sedative) during your procedure. If you did not receive a sedative, ask your health care provider when it is safe to drive.    Activity Return to your normal activities as told by your health care provider. Ask your health care provider what activities are safe for you. Do not lift anything that is heavier than 10 lb (4.5 kg), or the limit that you are told, until your health care provider says that it is safe. Do not do activities that involve lifting your arms over your head. General instructions Do not use any products that contain nicotine or tobacco, such as cigarettes and e-cigarettes. These can delay healing. If you need help quitting, ask your health care provider. Keep all follow-up visits as told by your health care provider. This is important. Contact a health care provider if: You have more redness, swelling, or pain around your incision. You have more fluid or blood coming from your incision. Your incision feels warm to the touch. You have pus or a bad smell coming from your incision. You have pain that is not relieved by your pain medicine. Get help right away if you have: A fever or chills. Chest pain. Difficulty breathing. Summary After the procedure, it is common to have pain, soreness, swelling, or bruising near your incision. If you were prescribed an antibiotic medicine, take it as told by your health care provider. Do not stop taking the antibiotic even if you start to feel better. Do not drive for 24 hours if you were given a sedative during   your procedure. Return to your normal activities as told by your health care provider. Ask your health care provider what activities are safe for you. This information is not intended to replace advice given to you by your health care provider. Make sure you discuss any questions you have with your health care provider. Document Revised: 04/22/2017 Document Reviewed: 04/22/2017 Elsevier Patient  Education  2021 Elsevier Inc.   Moderate Conscious Sedation, Adult, Care After This sheet gives you information about how to care for yourself after your procedure. Your health care provider may also give you more specific instructions. If you have problems or questions, contact your health care provider. What can I expect after the procedure? After the procedure, it is common to have: Sleepiness for several hours. Impaired judgment for several hours. Difficulty with balance. Vomiting if you eat too soon. Follow these instructions at home: For the time period you were told by your health care provider: Rest. Do not participate in activities where you could fall or become injured. Do not drive or use machinery. Do not drink alcohol. Do not take sleeping pills or medicines that cause drowsiness. Do not make important decisions or sign legal documents. Do not take care of children on your own.        Eating and drinking Follow the diet recommended by your health care provider. Drink enough fluid to keep your urine pale yellow. If you vomit: Drink water, juice, or soup when you can drink without vomiting. Make sure you have little or no nausea before eating solid foods.    General instructions Take over-the-counter and prescription medicines only as told by your health care provider. Have a responsible adult stay with you for the time you are told. It is important to have someone help care for you until you are awake and alert. Do not smoke. Keep all follow-up visits as told by your health care provider. This is important. Contact a health care provider if: You are still sleepy or having trouble with balance after 24 hours. You feel light-headed. You keep feeling nauseous or you keep vomiting. You develop a rash. You have a fever. You have redness or swelling around the IV site. Get help right away if: You have trouble breathing. You have new-onset confusion at  home. Summary After the procedure, it is common to feel sleepy, have impaired judgment, or feel nauseous if you eat too soon. Rest after you get home. Know the things you should not do after the procedure. Follow the diet recommended by your health care provider and drink enough fluid to keep your urine pale yellow. Get help right away if you have trouble breathing or new-onset confusion at home. This information is not intended to replace advice given to you by your health care provider. Make sure you discuss any questions you have with your health care provider. Document Revised: 07/07/2019 Document Reviewed: 02/02/2019 Elsevier Patient Education  2021 Elsevier Inc.  

## 2021-08-26 ENCOUNTER — Other Ambulatory Visit: Payer: Self-pay | Admitting: Oncology

## 2021-09-18 DIAGNOSIS — H40023 Open angle with borderline findings, high risk, bilateral: Secondary | ICD-10-CM | POA: Diagnosis not present

## 2021-09-18 DIAGNOSIS — H524 Presbyopia: Secondary | ICD-10-CM | POA: Diagnosis not present

## 2021-09-18 DIAGNOSIS — Z961 Presence of intraocular lens: Secondary | ICD-10-CM | POA: Diagnosis not present

## 2021-09-26 DIAGNOSIS — H524 Presbyopia: Secondary | ICD-10-CM | POA: Diagnosis not present

## 2021-09-26 DIAGNOSIS — H52209 Unspecified astigmatism, unspecified eye: Secondary | ICD-10-CM | POA: Diagnosis not present

## 2021-09-26 DIAGNOSIS — H5213 Myopia, bilateral: Secondary | ICD-10-CM | POA: Diagnosis not present

## 2021-09-30 ENCOUNTER — Ambulatory Visit
Admission: RE | Admit: 2021-09-30 | Discharge: 2021-09-30 | Disposition: A | Discharge status: Home / Self Care | Payer: Medicare HMO | Source: Ambulatory Visit | Attending: Hematology and Oncology | Admitting: Hematology and Oncology

## 2021-09-30 DIAGNOSIS — Z853 Personal history of malignant neoplasm of breast: Secondary | ICD-10-CM | POA: Diagnosis not present

## 2021-09-30 DIAGNOSIS — Z170 Estrogen receptor positive status [ER+]: Secondary | ICD-10-CM

## 2021-10-27 DIAGNOSIS — K2900 Acute gastritis without bleeding: Secondary | ICD-10-CM | POA: Diagnosis not present

## 2021-10-27 DIAGNOSIS — K649 Unspecified hemorrhoids: Secondary | ICD-10-CM | POA: Diagnosis not present

## 2021-10-27 DIAGNOSIS — B3781 Candidal esophagitis: Secondary | ICD-10-CM | POA: Diagnosis not present

## 2021-10-27 DIAGNOSIS — Z86010 Personal history of colonic polyps: Secondary | ICD-10-CM | POA: Diagnosis not present

## 2021-10-27 DIAGNOSIS — K5900 Constipation, unspecified: Secondary | ICD-10-CM | POA: Diagnosis not present

## 2021-10-28 DIAGNOSIS — Z124 Encounter for screening for malignant neoplasm of cervix: Secondary | ICD-10-CM | POA: Diagnosis not present

## 2021-10-28 DIAGNOSIS — R300 Dysuria: Secondary | ICD-10-CM | POA: Diagnosis not present

## 2021-10-28 DIAGNOSIS — Z1272 Encounter for screening for malignant neoplasm of vagina: Secondary | ICD-10-CM | POA: Diagnosis not present

## 2021-10-28 DIAGNOSIS — Z6832 Body mass index (BMI) 32.0-32.9, adult: Secondary | ICD-10-CM | POA: Diagnosis not present

## 2021-10-30 DIAGNOSIS — N200 Calculus of kidney: Secondary | ICD-10-CM | POA: Diagnosis not present

## 2021-10-30 DIAGNOSIS — C50919 Malignant neoplasm of unspecified site of unspecified female breast: Secondary | ICD-10-CM | POA: Diagnosis not present

## 2021-10-30 DIAGNOSIS — G629 Polyneuropathy, unspecified: Secondary | ICD-10-CM | POA: Diagnosis not present

## 2021-10-30 DIAGNOSIS — E7800 Pure hypercholesterolemia, unspecified: Secondary | ICD-10-CM | POA: Diagnosis not present

## 2021-10-30 DIAGNOSIS — I10 Essential (primary) hypertension: Secondary | ICD-10-CM | POA: Diagnosis not present

## 2021-10-30 DIAGNOSIS — H40023 Open angle with borderline findings, high risk, bilateral: Secondary | ICD-10-CM | POA: Diagnosis not present

## 2021-10-30 DIAGNOSIS — E1165 Type 2 diabetes mellitus with hyperglycemia: Secondary | ICD-10-CM | POA: Diagnosis not present

## 2021-11-03 ENCOUNTER — Ambulatory Visit: Payer: Medicare HMO | Attending: General Surgery

## 2021-11-03 VITALS — Wt 208.0 lb

## 2021-11-03 DIAGNOSIS — Z483 Aftercare following surgery for neoplasm: Secondary | ICD-10-CM | POA: Insufficient documentation

## 2021-11-03 NOTE — Therapy (Signed)
OUTPATIENT PHYSICAL THERAPY SOZO SCREENING NOTE   Patient Name: [name] MRN: [id number] DOB:October 21, 1955, 66 y.o., female Today's Date: 11/03/2021  PCP: Antony Contras, MD REFERRING PROVIDER: Rolm Bookbinder, MD   PT End of Session - 11/03/21 1024     Visit Number 3   # unchanged due to screen only   PT Start Time 1021    PT Stop Time 1026    PT Time Calculation (min) 5 min    Activity Tolerance Patient tolerated treatment well    Behavior During Therapy [facility] for tasks assessed/performed             Past Medical History:  Diagnosis Date   Breast cancer ([place]) 10/04/2020   Cancer ([place])    Cataract    removed   Diabetes mellitus without complication ([place])    History of kidney stones    Hyperlipidemia    Hypertension    PONV (postoperative nausea and vomiting)    Past Surgical History:  Procedure Laterality Date   ABDOMINAL HYSTERECTOMY     BREAST LUMPECTOMY Right 01/09/2021   Procedure: incision drainage right breast abscess;  Surgeon: Rolm Bookbinder, MD;  Location: [facility];  Service: General;  Laterality: Right;   BREAST LUMPECTOMY WITH RADIOACTIVE SEED AND SENTINEL LYMPH NODE BIOPSY Right 11/05/2020   Procedure: RIGHT BREAST LUMPECTOMY WITH RADIOACTIVE SEED AND RIGHT AXILLARY SENTINEL LYMPH NODE BIOPSY;  Surgeon: Rolm Bookbinder, MD;  Location: [place];  Service: General;  Laterality: Right;   CHOLECYSTECTOMY     IR REMOVAL TUN ACCESS W/ PORT W/O FL MOD SED  08/13/2021   KNEE SURGERY     PORTACATH PLACEMENT Right 12/30/2020   Procedure: INSERTION PORT-A-CATH;  Surgeon: Rolm Bookbinder, MD;  Location: [place];  Service: General;  Laterality: Right;   Patient Active Problem List   Diagnosis Date Noted   Port-A-Cath in place 01/23/2021   Uncontrolled type 2 diabetes mellitus with hyperglycemia ([place]) 01/07/2021   SIRS (systemic inflammatory response syndrome) ([place]) 01/06/2021   Malignant neoplasm of upper-inner quadrant of right breast in  female, estrogen receptor positive ([place]) 10/14/2020    REFERRING DIAG: right breast cancer at risk for lymphedema  THERAPY DIAG: Aftercare following surgery for neoplasm  PERTINENT HISTORY: Patient was diagnosed on 09/30/2020 with right grade I invasive lobular carcinoma breast cancer. She underwent a right lumpectomy and sentinel node biopsy (2 negative nodes) on 11/05/2020. It is ER positive, PR negative and HER2 negative with a Ki67 of 10%. Will be starting chemotherapy and then radiation.  PRECAUTIONS: right UE Lymphedema risk, None  SUBJECTIVE: Pt returns for her 3 month L-Dex screen.   PAIN:  Are you having pain? No  SOZO SCREENING: Patient was assessed today using the SOZO machine to determine the lymphedema index score. This was compared to her baseline score. It was determined that she is within the recommended range when compared to her baseline and no further action is needed at this time. She will continue SOZO screenings. These are done every 3 months for 2 years post operatively followed by every 6 months for 2 years, and then annually.    L-DEX FLOWSHEETS - 11/03/21 1000       L-DEX LYMPHEDEMA SCREENING   Measurement Type Unilateral    L-DEX MEASUREMENT EXTREMITY Upper Extremity    POSITION  Standing    DOMINANT SIDE Right    At Risk Side Right    BASELINE SCORE (UNILATERAL) 0.7    L-DEX SCORE (UNILATERAL) 6.1  VALUE CHANGE (UNILAT) 5.4              Otelia Limes, PTA 11/03/2021, 10:26 AM

## 2021-12-16 DIAGNOSIS — G629 Polyneuropathy, unspecified: Secondary | ICD-10-CM | POA: Diagnosis not present

## 2021-12-16 DIAGNOSIS — Z0000 Encounter for general adult medical examination without abnormal findings: Secondary | ICD-10-CM | POA: Diagnosis not present

## 2021-12-16 DIAGNOSIS — G4700 Insomnia, unspecified: Secondary | ICD-10-CM | POA: Diagnosis not present

## 2021-12-16 DIAGNOSIS — I10 Essential (primary) hypertension: Secondary | ICD-10-CM | POA: Diagnosis not present

## 2021-12-16 DIAGNOSIS — Z23 Encounter for immunization: Secondary | ICD-10-CM | POA: Diagnosis not present

## 2021-12-16 DIAGNOSIS — E782 Mixed hyperlipidemia: Secondary | ICD-10-CM | POA: Diagnosis not present

## 2021-12-16 DIAGNOSIS — E1169 Type 2 diabetes mellitus with other specified complication: Secondary | ICD-10-CM | POA: Diagnosis not present

## 2021-12-16 DIAGNOSIS — F419 Anxiety disorder, unspecified: Secondary | ICD-10-CM | POA: Diagnosis not present

## 2021-12-16 DIAGNOSIS — R0683 Snoring: Secondary | ICD-10-CM | POA: Diagnosis not present

## 2021-12-31 DIAGNOSIS — E1165 Type 2 diabetes mellitus with hyperglycemia: Secondary | ICD-10-CM | POA: Diagnosis not present

## 2021-12-31 DIAGNOSIS — E669 Obesity, unspecified: Secondary | ICD-10-CM | POA: Diagnosis not present

## 2021-12-31 DIAGNOSIS — G4719 Other hypersomnia: Secondary | ICD-10-CM | POA: Diagnosis not present

## 2022-01-14 DIAGNOSIS — G4733 Obstructive sleep apnea (adult) (pediatric): Secondary | ICD-10-CM | POA: Diagnosis not present

## 2022-01-16 DIAGNOSIS — E669 Obesity, unspecified: Secondary | ICD-10-CM | POA: Diagnosis not present

## 2022-01-16 DIAGNOSIS — E1165 Type 2 diabetes mellitus with hyperglycemia: Secondary | ICD-10-CM | POA: Diagnosis not present

## 2022-01-16 DIAGNOSIS — G4733 Obstructive sleep apnea (adult) (pediatric): Secondary | ICD-10-CM | POA: Diagnosis not present

## 2022-01-30 ENCOUNTER — Other Ambulatory Visit: Payer: Self-pay

## 2022-01-30 DIAGNOSIS — Z170 Estrogen receptor positive status [ER+]: Secondary | ICD-10-CM

## 2022-02-02 ENCOUNTER — Inpatient Hospital Stay: Payer: Medicare HMO | Attending: Hematology and Oncology | Admitting: Hematology and Oncology

## 2022-02-02 ENCOUNTER — Inpatient Hospital Stay: Payer: Medicare HMO

## 2022-02-02 ENCOUNTER — Encounter: Payer: Self-pay | Admitting: Hematology and Oncology

## 2022-02-02 VITALS — BP 145/69 | HR 85 | Temp 97.7°F | Resp 18 | Ht 67.0 in | Wt 213.7 lb

## 2022-02-02 DIAGNOSIS — Z9221 Personal history of antineoplastic chemotherapy: Secondary | ICD-10-CM | POA: Diagnosis not present

## 2022-02-02 DIAGNOSIS — C50211 Malignant neoplasm of upper-inner quadrant of right female breast: Secondary | ICD-10-CM | POA: Insufficient documentation

## 2022-02-02 DIAGNOSIS — E1140 Type 2 diabetes mellitus with diabetic neuropathy, unspecified: Secondary | ICD-10-CM | POA: Insufficient documentation

## 2022-02-02 DIAGNOSIS — Z923 Personal history of irradiation: Secondary | ICD-10-CM | POA: Insufficient documentation

## 2022-02-02 DIAGNOSIS — Z79899 Other long term (current) drug therapy: Secondary | ICD-10-CM | POA: Insufficient documentation

## 2022-02-02 DIAGNOSIS — Z170 Estrogen receptor positive status [ER+]: Secondary | ICD-10-CM | POA: Insufficient documentation

## 2022-02-02 LAB — CBC WITH DIFFERENTIAL (CANCER CENTER ONLY)
Abs Immature Granulocytes: 0 10*3/uL (ref 0.00–0.07)
Basophils Absolute: 0 10*3/uL (ref 0.0–0.1)
Basophils Relative: 1 %
Eosinophils Absolute: 0.1 10*3/uL (ref 0.0–0.5)
Eosinophils Relative: 3 %
HCT: 37.8 % (ref 36.0–46.0)
Hemoglobin: 12.6 g/dL (ref 12.0–15.0)
Immature Granulocytes: 0 %
Lymphocytes Relative: 37 %
Lymphs Abs: 2 10*3/uL (ref 0.7–4.0)
MCH: 31 pg (ref 26.0–34.0)
MCHC: 33.3 g/dL (ref 30.0–36.0)
MCV: 93.1 fL (ref 80.0–100.0)
Monocytes Absolute: 0.4 10*3/uL (ref 0.1–1.0)
Monocytes Relative: 8 %
Neutro Abs: 2.8 10*3/uL (ref 1.7–7.7)
Neutrophils Relative %: 51 %
Platelet Count: 219 10*3/uL (ref 150–400)
RBC: 4.06 MIL/uL (ref 3.87–5.11)
RDW: 13.1 % (ref 11.5–15.5)
WBC Count: 5.4 10*3/uL (ref 4.0–10.5)
nRBC: 0 % (ref 0.0–0.2)

## 2022-02-02 LAB — CMP (CANCER CENTER ONLY)
ALT: 23 U/L (ref 0–44)
AST: 21 U/L (ref 15–41)
Albumin: 4.2 g/dL (ref 3.5–5.0)
Alkaline Phosphatase: 69 U/L (ref 38–126)
Anion gap: 7 (ref 5–15)
BUN: 11 mg/dL (ref 8–23)
CO2: 28 mmol/L (ref 22–32)
Calcium: 9.5 mg/dL (ref 8.9–10.3)
Chloride: 104 mmol/L (ref 98–111)
Creatinine: 0.73 mg/dL (ref 0.44–1.00)
GFR, Estimated: >60 mL/min (ref >60)
Glucose, Bld: 173 mg/dL — ABNORMAL HIGH (ref 70–99)
Potassium: 4.9 mmol/L (ref 3.5–5.1)
Sodium: 139 mmol/L (ref 135–145)
Total Bilirubin: 0.3 mg/dL (ref 0.3–1.2)
Total Protein: 7.2 g/dL (ref 6.5–8.1)

## 2022-02-02 NOTE — Progress Notes (Signed)
[facility]  [phone] [phone]     ID: [name] DOB: 06/24/1955  MR#: [id number]  CSN#:[id number]  Patient Care Team: Iruku, Praveena, MD as PCP - General (Hematology and Oncology) Stuart, Dawn C, RN as Oncology Nurse Navigator Martini, Keisha N, RN as Oncology Nurse Navigator Wakefield, Matthew, MD as Consulting Physician (General Surgery) Squire, Sarah, MD as Attending Physician (Radiation Oncology) McComb, John, MD as Consulting Physician (Obstetrics and Gynecology) Balan, Bindubal, MD as Referring Physician (Endocrinology) Bridges Iruku, MD OTHER MD:  CHIEF COMPLAINT: Estrogen receptor positive breast cancer  CURRENT TREATMENT: Adjuvant chemotherapy  INTERVAL HISTORY:  [name] returns today for follow up with her husband and daughters for follow up of her estrogen receptor positive breast cancer.  She completed 4 cycles of adjuvant TC, last cycle completed on 03/03/2021 She continues with some residual neuropathy.  She is still struggling to lose weight but has much better control of her diabetes.  She continues to complain of some arthralgias, tolerable.  She also reports some dental issues that need to be taken care of.  She mentions that her hemoglobin A1c prior to chemotherapy was almost 15 but now it is at 6.6.  She is working on losing weight as well. She denies any breast changes today.  Last mammogram in July 2023 with no concerns for malignancy.  Rest of the pertinent 10 point ROS reviewed and negative   A detailed review of systems today was otherwise stable.  COVID 19 VACCINATION STATUS: Pfizer x3 as of July 2022   HISTORY OF CURRENT ILLNESS:  From the original intake note:  [name] had routine screening mammography on 09/04/2020 showing a possible abnormality in the right breast. She underwent right diagnostic mammography with tomography and right breast ultrasonography at [facility] on 09/30/2020 showing: breast  density category B; 0.7 cm mass in right breast at 12:30; normal right axillary lymph nodes.  Accordingly on 10/04/2020 she proceeded to biopsy of the right breast area in question. The pathology from this procedure ([id number]) showed: invasive mammary carcinoma, e-cadherin negative, grade 1; atypical lobular hyperplasia. Prognostic indicators significant for: estrogen receptor, 90% positive with moderate staining intensity and progesterone receptor, 0% negative. Proliferation marker Ki67 at 10%. HER2 negative by immunohistochemistry (1+).   Cancer Staging  Malignant neoplasm of upper-inner quadrant of right breast in female, estrogen receptor positive (HCC) Staging form: Breast, AJCC 8th Edition - Clinical stage from 10/16/2020: Stage IA (cT1b, cN0, cM0, G1, ER+, PR-, HER2-) - Signed by Magrinat, Gustav C, MD on 10/16/2020 Stage prefix: Initial diagnosis Histologic grading system: 3 grade system Laterality: Right Staged by: Pathologist and managing physician Stage used in treatment planning: Yes National guidelines used in treatment planning: Yes Type of national guideline used in treatment planning: NCCN   The patient's subsequent history is as detailed below.   PAST MEDICAL HISTORY: Past Medical History:  Diagnosis Date   Breast cancer (HCC) 10/04/2020   Cancer (HCC)    Cataract    removed   Diabetes mellitus without complication (HCC)    History of kidney stones    Hyperlipidemia    Hypertension    PONV (postoperative nausea and vomiting)     PAST SURGICAL HISTORY: Past Surgical History:  Procedure Laterality Date   ABDOMINAL HYSTERECTOMY     BREAST LUMPECTOMY Right 01/09/2021   Procedure: incision drainage right breast abscess;  Surgeon: Wakefield, Matthew, MD;  Location: [facility];  Service: General;  Laterality: Right;   BREAST LUMPECTOMY   WITH RADIOACTIVE SEED AND SENTINEL LYMPH NODE BIOPSY Right 11/05/2020   Procedure: RIGHT BREAST LUMPECTOMY WITH RADIOACTIVE SEED AND RIGHT  AXILLARY SENTINEL LYMPH NODE BIOPSY;  Surgeon: Rolm Bookbinder, MD;  Location: [facility];  Service: General;  Laterality: Right;   CHOLECYSTECTOMY     IR REMOVAL TUN ACCESS W/ PORT W/O FL MOD SED  08/13/2021   KNEE SURGERY     PORTACATH PLACEMENT Right 12/30/2020   Procedure: INSERTION PORT-A-CATH;  Surgeon: Rolm Bookbinder, MD;  Location: [place];  Service: General;  Laterality: Right;    FAMILY HISTORY: Family History  Problem Relation Age of Onset   Heart attack Mother    Diabetes Father    Colon cancer Maternal Grandmother    Breast cancer Cousin        dx early 74's   Her father died at age 52 from diabetes complications. Her mother died at age 52 from MI. [name] has two brothers (and no sisters). She reports breast cancer in a maternal cousin in her early 22's and colon cancer in a maternal grandmother.   GYNECOLOGIC HISTORY:  No LMP recorded. Patient has had a hysterectomy. Menarche: 66 years old Age at first live birth: 66 years old [name] 3 LMP 02/2000 Contraceptive: used from 88 HRT never used  Hysterectomy? Yes, 02/2000 BSO? 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 Husband [name] is a retired Radiographer, therapeutic. Son [name], age 99, is a Geophysicist/field seismologist and youth pastor in [place]. Daughter [name], age 52, is a high school principal in [place]. Daughter [name], age 56, is a hair stylist here in [place]. [name] has two grandchildren. She attends a [employer].    ADVANCED DIRECTIVES: In the absence of any documentation to the contrary, the patient's spouse is their HCPOA.    HEALTH MAINTENANCE: Social History   Tobacco Use   Smoking status: Former    Packs/day: 0.50    Years: 10.00    Total pack years: 5.00    Types: Cigarettes    Quit date: 09/25/1981    Years since quitting: 40.3   Smokeless tobacco:  Never  Vaping Use   Vaping Use: Never used  Substance Use Topics   Alcohol use: Never   Drug use: Never     Colonoscopy: 2018 (Dr. Earlean Shawl)  PAP: 08/2020  Bone density: 08/2020   Allergies  Allergen Reactions   Glimepiride     Hypoglycemia     Current Outpatient Medications  Medication Sig Dispense Refill   ACCU-CHEK GUIDE test strip      acetaminophen (TYLENOL) 500 MG tablet Take 1,000 mg by mouth every 6 (six) hours as needed for moderate pain.     amitriptyline (ELAVIL) 25 MG tablet Take 25 mg by mouth at bedtime.     anastrozole (ARIMIDEX) 1 MG tablet Take 1 tablet (1 mg total) by mouth daily. 90 tablet 3   Blood Glucose Monitoring Suppl (ACCU-CHEK GUIDE ME) w/Device KIT      Cholecalciferol (DIALYVITE VITAMIN D 5000) 125 MCG (5000 UT) capsule Take 5,000 Units by mouth daily.     fluconazole (DIFLUCAN) 150 MG tablet Take 1 tablet (150 mg total) by mouth daily. 30 tablet 0   ibuprofen (ADVIL) 200 MG tablet Take 400 mg by mouth every 6 (six) hours as needed for moderate pain or headache.  Insulin Syringe-Needle U-100 (INSULIN SYRINGE .5CC/30GX5/16") 30G X 5/16" 0.5 ML MISC See admin instructions.     lidocaine-prilocaine (EMLA) cream Apply to affected area once (Patient taking differently: Apply 1 application. topically. Apply to affected area once) 30 g 3   LORazepam (ATIVAN) 0.5 MG tablet Take 1 tablet (0.5 mg total) by mouth at bedtime as needed (Nausea or vomiting). 30 tablet 0   losartan (COZAAR) 25 MG tablet Take 25 mg by mouth daily.     metFORMIN (GLUCOPHAGE) 500 MG tablet Take 1,000 mg by mouth 2 (two) times daily.     Multiple Vitamin (MULTIVITAMIN WITH MINERALS) TABS tablet Take 1 tablet by mouth daily.     nystatin (MYCOSTATIN/NYSTOP) powder Apply 1 application topically 3 (three) times daily. 15 g 0   Probiotic Product (PROBIOTIC PO) Take 1 capsule by mouth daily.     prochlorperazine (COMPAZINE) 10 MG tablet Take 1 tablet (10 mg total) by mouth every 6 (six) hours  as needed (Nausea or vomiting). 30 tablet 1   sertraline (ZOLOFT) 50 MG tablet Take 50 mg by mouth daily.     simvastatin (ZOCOR) 40 MG tablet Take 40 mg by mouth at bedtime.     TOUJEO SOLOSTAR 300 UNIT/ML Solostar Pen Inject 45 Units into the skin daily.     traMADol (ULTRAM) 50 MG tablet Take 1-2 tablets (50-100 mg total) by mouth every 6 (six) hours as needed for moderate pain. 30 tablet 0   No current facility-administered medications for this visit.    OBJECTIVE: White woman in no acute distress  Vitals:   02/02/22 0918  BP: (!) 145/69  Pulse: 85  Resp: 18  Temp: 97.7 F (36.5 Bridges)  SpO2: 97%        Body mass index is 33.47 kg/m.   Wt Readings from Last 3 Encounters:  02/02/22 213 lb 11.2 oz (96.9 kg)  11/03/21 208 lb (94.3 kg)  08/01/21 207 lb 8 oz (94.1 kg)   Physical Exam Constitutional:      Appearance: Normal appearance.  Cardiovascular:     Rate and Rhythm: Normal rate and regular rhythm.     Pulses: Normal pulses.     Heart sounds: Normal heart sounds.  Pulmonary:     Effort: Pulmonary effort is normal.     Breath sounds: Normal breath sounds.  Chest:     Comments: Bilateral breast exam done, excellent cosmetic outcome in the right breast.  No palpable masses or regional adenopathy. Abdominal:     General: Abdomen is flat. Bowel sounds are normal.     Palpations: Abdomen is soft.  Musculoskeletal:        General: No swelling or tenderness.  Neurological:     General: No focal deficit present.     Mental Status: She is alert.      LAB RESULTS:  CMP     Component Value Date/Time   NA 139 02/02/2022 0848   K 4.9 02/02/2022 0848   CL 104 02/02/2022 0848   CO2 28 02/02/2022 0848   GLUCOSE 173 (H) 02/02/2022 0848   BUN 11 02/02/2022 0848   CREATININE 0.73 02/02/2022 0848   CALCIUM 9.5 02/02/2022 0848   PROT 7.2 02/02/2022 0848   ALBUMIN 4.2 02/02/2022 0848   AST 21 02/02/2022 0848   ALT 23 02/02/2022 0848   ALKPHOS 69 02/02/2022 0848    BILITOT 0.3 02/02/2022 0848   GFRNONAA >60 02/02/2022 0848    No results found for: "TOTALPROTELP", "ALBUMINELP", "A1GS", "A2GS", "BETS", "  BETA2SER", "GAMS", "MSPIKE", "SPEI"  Lab Results  Component Value Date   WBC 5.4 02/02/2022   NEUTROABS 2.8 02/02/2022   HGB 12.6 02/02/2022   HCT 37.8 02/02/2022   MCV 93.1 02/02/2022   PLT 219 02/02/2022    No results found for: "LABCA2"  No components found for: "UTMLYY503"  No results for input(s): "INR" in the last 168 hours.  No results found for: "LABCA2"  No results found for: "TWS568"  No results found for: "CAN125"  No results found for: "CAN153"  No results found for: "CA2729"  No components found for: "HGQUANT"  No results found for: "CEA1", "CEA" / No results found for: "CEA1", "CEA"   No results found for: "AFPTUMOR"  No results found for: "CHROMOGRNA"  No results found for: "KPAFRELGTCHN", "LAMBDASER", "KAPLAMBRATIO" (kappa/lambda light chains)  No results found for: "HGBA", "HGBA2QUANT", "HGBFQUANT", "HGBSQUAN" (Hemoglobinopathy evaluation)   No results found for: "LDH"  No results found for: "IRON", "TIBC", "IRONPCTSAT" (Iron and TIBC)  No results found for: "FERRITIN"  Urinalysis    Component Value Date/Time   COLORURINE AMBER (A) 01/06/2021 2145   APPEARANCEUR CLEAR 01/06/2021 2145   LABSPEC >1.046 (H) 01/06/2021 2145   PHURINE 5.0 01/06/2021 2145   GLUCOSEU 150 (A) 01/06/2021 2145   [place] NEGATIVE 01/06/2021 2145   [place] NEGATIVE 01/06/2021 2145   [place] NEGATIVE 01/06/2021 2145   PROTEINUR 30 (A) 01/06/2021 2145   NITRITE POSITIVE (A) 01/06/2021 2145   LEUKOCYTESUR TRACE (A) 01/06/2021 2145    STUDIES: No results found.  ELIGIBLE FOR AVAILABLE RESEARCH PROTOCOL: no  ASSESSMENT:   66 y.o. [place] woman status post right breast upper inner quadrant biopsy 10/04/2020 for clinical T1b N0, stage IA invasive lobular carcinoma, E-cadherin negative, grade 1, estrogen receptor  positive, progesterone receptor and HER2 negative, with an MIB-1 of 10%  (1) status post right lumpectomy and sentinel lymph node sampling 11/05/2020 for a PT1b pN0, stage IA invasive lobular carcinoma, grade 2  (2) Oncotype score of 32 predicts a risk of recurrence outside the breast within the next 9 years of 20% if the patient's only systemic therapy is antiestrogens for 5 years.  It also predicts significant benefit from chemotherapy.  (3) adjuvant chemotherapy with cyclophosphamide and docetaxel every 21 days x 4 started 12/31/2020, completed 03/03/2021  (4) completed adjuvant radiation and is now on antiestrogen therapy, anastrozole  Ms. Welty is here for follow-up.  She has been compliant with anastrozole as recommended.  She denies any new breast changes.  She has much better control of her diabetes at this time.  She however could not afford Ozempic hence had to discontinue it.  She continues to work on her weight loss. Physical examination today without any concerns.  Most recent mammogram in July with no findings concerning for recurrence. She will return to clinic in 6 months or we will alternate with Dr. Donne Hazel if she has a follow-up appointment coming up in 6 months. We have discussed about portion control, regular exercise and hydratio for weight control.  Total encounter time 30 minutes*   *Total Encounter Time as defined by the Centers for Medicare and Medicaid Services includes, in addition to the face-to-face time of a patient visit (documented in the note above) non-face-to-face time: obtaining and reviewing outside history, ordering and reviewing medications, tests or procedures, care coordination (communications with other health care professionals or caregivers) and documentation in the medical record.

## 2022-02-16 ENCOUNTER — Ambulatory Visit: Payer: Medicare HMO | Attending: General Surgery

## 2022-02-16 VITALS — Wt 211.0 lb

## 2022-02-16 DIAGNOSIS — Z483 Aftercare following surgery for neoplasm: Secondary | ICD-10-CM | POA: Insufficient documentation

## 2022-02-16 NOTE — Therapy (Signed)
OUTPATIENT PHYSICAL THERAPY SOZO SCREENING NOTE   Patient Name: [name] MRN: [id number] DOB:1955/07/07, 66 y.o., female Today's Date: 02/16/2022  PCP: Benay Pike, MD REFERRING PROVIDER: Antony Contras, MD   PT End of Session - 02/16/22 1026     Visit Number 3   # unchanged due to screen only   PT Start Time 1023    PT Stop Time 1028    PT Time Calculation (min) 5 min    Activity Tolerance Patient tolerated treatment well    Behavior During Therapy [facility] for tasks assessed/performed             Past Medical History:  Diagnosis Date   Breast cancer ([place]) 10/04/2020   Cancer ([place])    Cataract    removed   Diabetes mellitus without complication ([place])    History of kidney stones    Hyperlipidemia    Hypertension    PONV (postoperative nausea and vomiting)    Past Surgical History:  Procedure Laterality Date   ABDOMINAL HYSTERECTOMY     BREAST LUMPECTOMY Right 01/09/2021   Procedure: incision drainage right breast abscess;  Surgeon: Rolm Bookbinder, MD;  [facility];  Service: General;  Laterality: Right;   BREAST LUMPECTOMY WITH RADIOACTIVE SEED AND SENTINEL LYMPH NODE BIOPSY Right 11/05/2020   Procedure: RIGHT BREAST LUMPECTOMY WITH RADIOACTIVE SEED AND RIGHT AXILLARY SENTINEL LYMPH NODE BIOPSY;  Surgeon: Rolm Bookbinder, MD;  Location: [place];  Service: General;  Laterality: Right;   CHOLECYSTECTOMY     IR REMOVAL TUN ACCESS W/ PORT W/O FL MOD SED  08/13/2021   KNEE SURGERY     PORTACATH PLACEMENT Right 12/30/2020   Procedure: INSERTION PORT-A-CATH;  Surgeon: Rolm Bookbinder, MD;  Location: [facility];  Service: General;  Laterality: Right;   Patient Active Problem List   Diagnosis Date Noted   Port-A-Cath in place 01/23/2021   Uncontrolled type 2 diabetes mellitus with hyperglycemia ([place]) 01/07/2021   SIRS (systemic inflammatory response syndrome) ([place]) 01/06/2021   Malignant neoplasm of upper-inner quadrant of right breast in  female, estrogen receptor positive ([place]) 10/14/2020    REFERRING DIAG: right breast cancer at risk for lymphedema  THERAPY DIAG: Aftercare following surgery for neoplasm  PERTINENT HISTORY: Patient was diagnosed on 09/30/2020 with right grade I invasive lobular carcinoma breast cancer. She underwent a right lumpectomy and sentinel node biopsy (2 negative nodes) on 11/05/2020. It is ER positive, PR negative and HER2 negative with a Ki67 of 10%. Will be starting chemotherapy and then radiation.  PRECAUTIONS: right UE Lymphedema risk, None  SUBJECTIVE: Pt returns for her 3 month L-Dex screen.   PAIN:  Are you having pain? No  SOZO SCREENING: Patient was assessed today using the SOZO machine to determine the lymphedema index score. This was compared to her baseline score. It was determined that she is within the recommended range when compared to her baseline and no further action is needed at this time. She will continue SOZO screenings. These are done every 3 months for 2 years post operatively followed by every 6 months for 2 years, and then annually.    L-DEX FLOWSHEETS - 02/16/22 1000       L-DEX LYMPHEDEMA SCREENING   Measurement Type Unilateral    L-DEX MEASUREMENT EXTREMITY Upper Extremity    POSITION  Standing    DOMINANT SIDE Right    At Risk Side Right    BASELINE SCORE (UNILATERAL) 0.7    L-DEX SCORE (UNILATERAL) 2.3  VALUE CHANGE (UNILAT) 1.6              Otelia Limes, PTA 02/16/2022, 10:28 AM

## 2022-03-10 DIAGNOSIS — M545 Low back pain, unspecified: Secondary | ICD-10-CM | POA: Diagnosis not present

## 2022-04-15 ENCOUNTER — Other Ambulatory Visit: Payer: Self-pay | Admitting: Hematology and Oncology

## 2022-04-28 DIAGNOSIS — G4733 Obstructive sleep apnea (adult) (pediatric): Secondary | ICD-10-CM | POA: Diagnosis not present

## 2022-04-28 DIAGNOSIS — E1165 Type 2 diabetes mellitus with hyperglycemia: Secondary | ICD-10-CM | POA: Diagnosis not present

## 2022-04-28 DIAGNOSIS — I10 Essential (primary) hypertension: Secondary | ICD-10-CM | POA: Diagnosis not present

## 2022-04-28 DIAGNOSIS — E669 Obesity, unspecified: Secondary | ICD-10-CM | POA: Diagnosis not present

## 2022-05-04 DIAGNOSIS — E1165 Type 2 diabetes mellitus with hyperglycemia: Secondary | ICD-10-CM | POA: Diagnosis not present

## 2022-05-04 DIAGNOSIS — E7800 Pure hypercholesterolemia, unspecified: Secondary | ICD-10-CM | POA: Diagnosis not present

## 2022-05-04 DIAGNOSIS — G629 Polyneuropathy, unspecified: Secondary | ICD-10-CM | POA: Diagnosis not present

## 2022-05-11 DIAGNOSIS — I10 Essential (primary) hypertension: Secondary | ICD-10-CM | POA: Diagnosis not present

## 2022-05-11 DIAGNOSIS — G629 Polyneuropathy, unspecified: Secondary | ICD-10-CM | POA: Diagnosis not present

## 2022-05-11 DIAGNOSIS — C50919 Malignant neoplasm of unspecified site of unspecified female breast: Secondary | ICD-10-CM | POA: Diagnosis not present

## 2022-05-11 DIAGNOSIS — N200 Calculus of kidney: Secondary | ICD-10-CM | POA: Diagnosis not present

## 2022-05-11 DIAGNOSIS — E1165 Type 2 diabetes mellitus with hyperglycemia: Secondary | ICD-10-CM | POA: Diagnosis not present

## 2022-05-11 DIAGNOSIS — E7800 Pure hypercholesterolemia, unspecified: Secondary | ICD-10-CM | POA: Diagnosis not present

## 2022-05-14 DIAGNOSIS — E1165 Type 2 diabetes mellitus with hyperglycemia: Secondary | ICD-10-CM | POA: Diagnosis not present

## 2022-05-18 ENCOUNTER — Ambulatory Visit: Payer: Medicare HMO | Attending: General Surgery

## 2022-05-18 VITALS — Wt 212.1 lb

## 2022-05-18 DIAGNOSIS — Z483 Aftercare following surgery for neoplasm: Secondary | ICD-10-CM | POA: Insufficient documentation

## 2022-05-18 NOTE — Therapy (Signed)
OUTPATIENT PHYSICAL THERAPY [facility]ZO SCREENING NOTE   Patient Name: [name] MRN: [id number] DOB:[birth year], 67 y.o., female Today's Date: 05/18/2022  PCP: Benay Pike, MD REFERRING PROVIDER: Rolm Bookbinder, MD   PT End of Session - 05/18/22 1011     Visit Number 3   # unchanged due to screen only   PT Start Time 1009    PT Stop Time 1013    PT Time Calculation (min) 4 min    Activity Tolerance Patient tolerated treatment well    Behavior During Therapy [facility] for tasks assessed/performed             Past Medical History:  Diagnosis Date   Breast cancer ([place]) 10/04/2020   Cancer ([place])    Cataract    removed   Diabetes mellitus without complication ([place])    History of kidney stones    Hyperlipidemia    Hypertension    PONV (postoperative nausea and vomiting)    Past Surgical History:  Procedure Laterality Date   ABDOMINAL HYSTERECTOMY     BREAST LUMPECTOMY Right 01/09/2021   Procedure: incision drainage right breast abscess;  Surgeon: Rolm Bookbinder, MD;  [facility];  Service: General;  Laterality: Right;   BREAST LUMPECTOMY WITH RADIOACTIVE SEED AND SENTINEL LYMPH NODE BIOPSY Right 11/05/2020   Procedure: RIGHT BREAST LUMPECTOMY WITH RADIOACTIVE SEED AND RIGHT AXILLARY SENTINEL LYMPH NODE BIOPSY;  Surgeon: Rolm Bookbinder, MD;  Location: [facility];  Service: General;  Laterality: Right;   CHOLECYSTECTOMY     IR REMOVAL TUN ACCESS W/ PORT W/O FL MOD SED  08/13/2021   KNEE SURGERY     PORTACATH PLACEMENT Right 12/30/2020   Procedure: INSERTION PORT-A-CATH;  Surgeon: Rolm Bookbinder, MD;  Location: [facility];  Service: General;  Laterality: Right;   Patient Active Problem List   Diagnosis Date Noted   Port-A-Cath in place 01/23/2021   Uncontrolled type 2 diabetes mellitus with hyperglycemia ([place]) 01/07/2021   SIRS (systemic inflammatory response syndrome) ([employer]) 01/06/2021   Malignant neoplasm of upper-inner quadrant of right breast in  female, estrogen receptor positive ([place]) 10/14/2020    REFERRING DIAG: right breast cancer at risk for lymphedema  THERAPY DIAG: Aftercare following surgery for neoplasm  PERTINENT HISTORY: Patient was diagnosed on 09/30/2020 with right grade I invasive lobular carcinoma breast cancer. She underwent a right lumpectomy and sentinel node biopsy (2 negative nodes) on 11/05/2020. It is ER positive, PR negative and HER2 negative with a Ki67 of 10%. Will be starting chemotherapy and then radiation.  PRECAUTIONS: right UE Lymphedema risk, None  SUBJECTIVE: Pt returns for her 3 month L-Dex screen.   PAIN:  Are you having pain? No  SOZO SCREENING: Patient was assessed today using the SOZO machine to determine the lymphedema index score. This was compared to her baseline score. It was determined that she is within the recommended range when compared to her baseline and no further action is needed at this time. She will continue SOZO screenings. These are done every 3 months for 2 years post operatively followed by every 6 months for 2 years, and then annually.    L-DEX FLOWSHEETS - 05/18/22 1000       L-DEX LYMPHEDEMA SCREENING   Measurement Type Unilateral    L-DEX MEASUREMENT EXTREMITY Upper Extremity    POSITION  Standing    DOMINANT SIDE Right    At Risk Side Right    BASELINE SCORE (UNILATERAL) 0.7    L-DEX SCORE (UNILATERAL) -4.6  VALUE CHANGE (UNILAT) -5.3              Otelia Limes, PTA 05/18/2022, 10:13 AM

## 2022-05-21 DIAGNOSIS — E1165 Type 2 diabetes mellitus with hyperglycemia: Secondary | ICD-10-CM | POA: Diagnosis not present

## 2022-06-03 DIAGNOSIS — E7800 Pure hypercholesterolemia, unspecified: Secondary | ICD-10-CM | POA: Diagnosis not present

## 2022-06-03 DIAGNOSIS — E1165 Type 2 diabetes mellitus with hyperglycemia: Secondary | ICD-10-CM | POA: Diagnosis not present

## 2022-06-10 DIAGNOSIS — N200 Calculus of kidney: Secondary | ICD-10-CM | POA: Diagnosis not present

## 2022-06-10 DIAGNOSIS — G629 Polyneuropathy, unspecified: Secondary | ICD-10-CM | POA: Diagnosis not present

## 2022-06-10 DIAGNOSIS — E1165 Type 2 diabetes mellitus with hyperglycemia: Secondary | ICD-10-CM | POA: Diagnosis not present

## 2022-06-10 DIAGNOSIS — E7800 Pure hypercholesterolemia, unspecified: Secondary | ICD-10-CM | POA: Diagnosis not present

## 2022-06-10 DIAGNOSIS — I10 Essential (primary) hypertension: Secondary | ICD-10-CM | POA: Diagnosis not present

## 2022-06-10 DIAGNOSIS — C50919 Malignant neoplasm of unspecified site of unspecified female breast: Secondary | ICD-10-CM | POA: Diagnosis not present

## 2022-06-18 DIAGNOSIS — I10 Essential (primary) hypertension: Secondary | ICD-10-CM | POA: Diagnosis not present

## 2022-06-18 DIAGNOSIS — E1142 Type 2 diabetes mellitus with diabetic polyneuropathy: Secondary | ICD-10-CM | POA: Diagnosis not present

## 2022-06-18 DIAGNOSIS — C50911 Malignant neoplasm of unspecified site of right female breast: Secondary | ICD-10-CM | POA: Diagnosis not present

## 2022-06-18 DIAGNOSIS — R0981 Nasal congestion: Secondary | ICD-10-CM | POA: Diagnosis not present

## 2022-06-18 DIAGNOSIS — G4700 Insomnia, unspecified: Secondary | ICD-10-CM | POA: Diagnosis not present

## 2022-06-18 DIAGNOSIS — E782 Mixed hyperlipidemia: Secondary | ICD-10-CM | POA: Diagnosis not present

## 2022-06-18 DIAGNOSIS — F419 Anxiety disorder, unspecified: Secondary | ICD-10-CM | POA: Diagnosis not present

## 2022-07-13 DIAGNOSIS — E1165 Type 2 diabetes mellitus with hyperglycemia: Secondary | ICD-10-CM | POA: Diagnosis not present

## 2022-08-03 ENCOUNTER — Other Ambulatory Visit: Payer: Self-pay

## 2022-08-03 ENCOUNTER — Inpatient Hospital Stay: Payer: Medicare HMO | Attending: Hematology and Oncology | Admitting: Hematology and Oncology

## 2022-08-03 ENCOUNTER — Ambulatory Visit: Payer: Medicare HMO

## 2022-08-03 VITALS — BP 148/75 | HR 80 | Temp 97.2°F | Resp 15 | Wt 206.0 lb

## 2022-08-03 DIAGNOSIS — Z79899 Other long term (current) drug therapy: Secondary | ICD-10-CM | POA: Diagnosis not present

## 2022-08-03 DIAGNOSIS — Z9221 Personal history of antineoplastic chemotherapy: Secondary | ICD-10-CM | POA: Insufficient documentation

## 2022-08-03 DIAGNOSIS — Z87891 Personal history of nicotine dependence: Secondary | ICD-10-CM | POA: Diagnosis not present

## 2022-08-03 DIAGNOSIS — R682 Dry mouth, unspecified: Secondary | ICD-10-CM | POA: Diagnosis not present

## 2022-08-03 DIAGNOSIS — E1140 Type 2 diabetes mellitus with diabetic neuropathy, unspecified: Secondary | ICD-10-CM | POA: Diagnosis not present

## 2022-08-03 DIAGNOSIS — Z170 Estrogen receptor positive status [ER+]: Secondary | ICD-10-CM

## 2022-08-03 DIAGNOSIS — Z79811 Long term (current) use of aromatase inhibitors: Secondary | ICD-10-CM | POA: Insufficient documentation

## 2022-08-03 DIAGNOSIS — Z923 Personal history of irradiation: Secondary | ICD-10-CM | POA: Diagnosis not present

## 2022-08-03 DIAGNOSIS — C50211 Malignant neoplasm of upper-inner quadrant of right female breast: Secondary | ICD-10-CM

## 2022-08-03 NOTE — Progress Notes (Signed)
[facility]  [phone] [phone]     ID: GENESIA [id number] DOB: [birth year]  MR#: [id number]  [name]:[id number]  Patient Care Team: [name], MD as PCP - General (Hematology and Oncology) [name], RN as Oncology Nurse Navigator [name], RN as Oncology Nurse Navigator [name], MD as Consulting Physician (General Surgery) [name], MD as Attending Physician (Radiation Oncology) [name], MD as Consulting Physician (Obstetrics and Gynecology) [name], MD as Referring Physician (Endocrinology) [name], MD OTHER MD:  CHIEF COMPLAINT: Estrogen receptor positive breast cancer  CURRENT TREATMENT: Adjuvant chemotherapy  INTERVAL HISTORY:  Sanai returns today for follow up with her husband and daughters for follow up of her estrogen receptor positive breast cancer.  She completed 4 cycles of adjuvant TC, last cycle completed on 03/03/2021 She is feeling very well today. She is taking amitriptyline for neuropathy. She says this is helping her. She also has dry mouth which is chronic. She had to have dental work done too. She also was found to have some esophageal stenosis, so required dilation which was though to be from radiation. Rest of the pertinent 10 point ROS reviewed and neg.   A detailed review of systems today was otherwise stable.  COVID 19 VACCINATION STATUS: Pfizer x3 as of July 2022   HISTORY OF CURRENT ILLNESS:  From the original intake note:  JOPLIN KAMERER had routine screening mammography on 09/04/2020 showing a possible abnormality in the right breast. She underwent right diagnostic mammography with tomography and right breast ultrasonography at [facility] on 09/30/2020 showing: breast density category B; 0.7 cm mass in right breast at 12:30; normal right axillary lymph nodes.  Accordingly on 10/04/2020 she proceeded to biopsy of the right breast area in question. The pathology  from this procedure ([id number]) showed: invasive mammary carcinoma, e-cadherin negative, grade 1; atypical lobular hyperplasia. Prognostic indicators significant for: estrogen receptor, 90% positive with moderate staining intensity and progesterone receptor, 0% negative. Proliferation marker Ki67 at 10%. HER2 negative by immunohistochemistry (1+).   Cancer Staging  Malignant neoplasm of upper-inner quadrant of right breast in female, estrogen receptor positive (HCC) Staging form: Breast, AJCC 8th Edition - Clinical stage from 10/16/2020: Stage IA (cT1b, cN0, cM0, G1, ER+, PR-, HER2-) - Signed by [name], MD on 10/16/2020 Stage prefix: Initial diagnosis Histologic grading system: 3 grade system Laterality: Right Staged by: Pathologist and managing physician Stage used in treatment planning: Yes National guidelines used in treatment planning: Yes Type of national guideline used in treatment planning: NCCN   The patient's subsequent history is as detailed below.   PAST MEDICAL HISTORY: Past Medical History:  Diagnosis Date   Breast cancer (HCC) 10/04/2020   Cancer (HCC)    Cataract    removed   Diabetes mellitus without complication (HCC)    History of kidney stones    Hyperlipidemia    Hypertension    PONV (postoperative nausea and vomiting)     PAST SURGICAL HISTORY: Past Surgical History:  Procedure Laterality Date   ABDOMINAL HYSTERECTOMY     BREAST LUMPECTOMY Right 01/09/2021   Procedure: incision drainage right breast abscess;  Surgeon: [name], MD;  Location: [facility];  Service: General;  Laterality: Right;   BREAST LUMPECTOMY WITH RADIOACTIVE SEED AND SENTINEL LYMPH NODE BIOPSY Right 11/05/2020   Procedure: RIGHT BREAST LUMPECTOMY WITH RADIOACTIVE SEED AND RIGHT AXILLARY SENTINEL LYMPH NODE BIOPSY;  Surgeon: [name], MD;  Location: [facility];  Service: General;  Laterality: Right;   CHOLECYSTECTOMY     IR REMOVAL TUN ACCESS W/  PORT W/O FL MOD SED  08/13/2021   KNEE SURGERY     PORTACATH PLACEMENT Right 12/30/2020   Procedure: INSERTION PORT-A-CATH;  Surgeon: [name], MD;  Location: [facility] OR;  Service: General;  Laterality: Right;    FAMILY HISTORY: Family History  Problem Relation Age of Onset   Heart attack Mother    Diabetes Father    Colon cancer Maternal Grandmother    Breast cancer Cousin        dx early 67's   Her father died at age 67 from diabetes complications. Her mother died at age 67 from MI. Anayra has two brothers (and no sisters). She reports breast cancer in a maternal cousin in her early 80's and colon cancer in a maternal grandmother.   GYNECOLOGIC HISTORY:  No LMP recorded. Patient has had a hysterectomy. Menarche: 67 years old Age at first live birth: 67 years old years old GX P 3 LMP 02/2000 Contraceptive: used from 20 HRT never used  Hysterectomy? Yes, 02/2000 BSO? 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 Husband [name] is a retired Scientific laboratory technician. Son [name], age 41, is a Firefighter and youth pastor in [place]. Daughter [name], age 37, is a high school principal in [place]. Daughter [name], age 30, is a hair stylist here in [place]. Kachina has two grandchildren. She attends a [employer].    ADVANCED DIRECTIVES: In the absence of any documentation to the contrary, the patient's spouse is their HCPOA.    HEALTH MAINTENANCE: Social History   Tobacco Use   Smoking status: Former    Packs/day: 0.50    Years: 10.00    Additional pack years: 0.00    Total pack years: 5.00    Types: Cigarettes    Quit date: 09/25/1981    Years since quitting: 40.8   Smokeless tobacco: Never  Vaping Use   Vaping Use: Never used  Substance Use Topics   Alcohol use: Never   Drug use: Never     Colonoscopy: 2018 (Dr. Kinnie Scales)  PAP:  08/2020  Bone density: 08/2020   Allergies  Allergen Reactions   Glimepiride     Hypoglycemia     Current Outpatient Medications  Medication Sig Dispense Refill   ACCU-CHEK GUIDE test strip      acetaminophen (TYLENOL) 500 MG tablet Take 1,000 mg by mouth every 6 (six) hours as needed for moderate pain.     amitriptyline (ELAVIL) 25 MG tablet Take 25 mg by mouth at bedtime.     anastrozole (ARIMIDEX) 1 MG tablet TAKE 1 TABLET EVERY DAY 90 tablet 3   Blood Glucose Monitoring Suppl (ACCU-CHEK GUIDE ME) w/Device KIT      Cholecalciferol (DIALYVITE VITAMIN D 5000) 125 MCG (5000 UT) capsule Take 5,000 Units by mouth daily.     fluconazole (DIFLUCAN) 150 MG tablet Take 1 tablet (150 mg total) by mouth daily. 30 tablet 0   ibuprofen (ADVIL) 200 MG tablet Take 400 mg by mouth every 6 (six) hours as needed for moderate pain or headache.     Insulin Syringe-Needle U-100 (INSULIN SYRINGE .5CC/30GX5/16") 30G X 5/16" 0.5 ML MISC See admin instructions.     lidocaine-prilocaine (EMLA) cream Apply to affected area once (Patient taking differently: Apply 1 application. topically. Apply  to affected area once) 30 g 3   LORazepam (ATIVAN) 0.5 MG tablet Take 1 tablet (0.5 mg total) by mouth at bedtime as needed (Nausea or vomiting). 30 tablet 0   losartan (COZAAR) 25 MG tablet Take 25 mg by mouth daily.     metFORMIN (GLUCOPHAGE) 500 MG tablet Take 1,000 mg by mouth 2 (two) times daily.     Multiple Vitamin (MULTIVITAMIN WITH MINERALS) TABS tablet Take 1 tablet by mouth daily.     nystatin (MYCOSTATIN/NYSTOP) powder Apply 1 application topically 3 (three) times daily. 15 g 0   Probiotic Product (PROBIOTIC PO) Take 1 capsule by mouth daily.     prochlorperazine (COMPAZINE) 10 MG tablet Take 1 tablet (10 mg total) by mouth every 6 (six) hours as needed (Nausea or vomiting). 30 tablet 1   sertraline (ZOLOFT) 50 MG tablet Take 50 mg by mouth daily.     simvastatin (ZOCOR) 40 MG tablet Take 40 mg by mouth at  bedtime.     TOUJEO SOLOSTAR 300 UNIT/ML Solostar Pen Inject 45 Units into the skin daily.     traMADol (ULTRAM) 50 MG tablet Take 1-2 tablets (50-100 mg total) by mouth every 6 (six) hours as needed for moderate pain. 30 tablet 0   No current facility-administered medications for this visit.    OBJECTIVE: White woman in no acute distress  Vitals:   08/03/22 0957  BP: (!) 148/75  Pulse: 80  Resp: 15  Temp: (!) 97.2 F (36.2 C)  SpO2: 96%        Body mass index is 32.26 kg/m.   Wt Readings from Last 3 Encounters:  08/03/22 206 lb (93.4 kg)  05/18/22 212 lb 2 oz (96.2 kg)  02/16/22 211 lb (95.7 kg)   Physical Exam Constitutional:      Appearance: Normal appearance.  Chest:     Comments: Bilateral breast exam done, excellent cosmetic outcome in the right breast.  No palpable masses or regional adenopathy. Musculoskeletal:        General: No swelling or tenderness.     Cervical back: Normal range of motion and neck supple. No rigidity.  Neurological:     General: No focal deficit present.     Mental Status: She is alert.      LAB RESULTS:  CMP     Component Value Date/Time   NA 139 02/02/2022 0848   K 4.9 02/02/2022 0848   CL 104 02/02/2022 0848   CO2 28 02/02/2022 0848   GLUCOSE 173 (H) 02/02/2022 0848   BUN 11 02/02/2022 0848   CREATININE 0.73 02/02/2022 0848   CALCIUM 9.5 02/02/2022 0848   PROT 7.2 02/02/2022 0848   ALBUMIN 4.2 02/02/2022 0848   AST 21 02/02/2022 0848   ALT 23 02/02/2022 0848   ALKPHOS 69 02/02/2022 0848   BILITOT 0.3 02/02/2022 0848   GFRNONAA >60 02/02/2022 0848    No results found for: "TOTALPROTELP", "ALBUMINELP", "A1GS", "A2GS", "BETS", "BETA2SER", "GAMS", "MSPIKE", "SPEI"  Lab Results  Component Value Date   WBC 5.4 02/02/2022   NEUTROABS 2.8 02/02/2022   HGB 12.6 02/02/2022   HCT 37.8 02/02/2022   MCV 93.1 02/02/2022   PLT 219 02/02/2022    No results found for: "LABCA2"  No components found for: "ZOXWRU045"  No  results for input(s): "INR" in the last 168 hours.  No results found for: "LABCA2"  No results found for: "WUJ811"  No results found for: "CAN125"  No results found for: "BJY782"  No results  found for: "CA2729"  No components found for: "HGQUANT"  No results found for: "CEA1", "CEA" / No results found for: "CEA1", "CEA"   No results found for: "AFPTUMOR"  No results found for: "CHROMOGRNA"  No results found for: "KPAFRELGTCHN", "LAMBDASER", "KAPLAMBRATIO" (kappa/lambda light chains)  No results found for: "HGBA", "HGBA2QUANT", "HGBFQUANT", "HGBSQUAN" (Hemoglobinopathy evaluation)   No results found for: "LDH"  No results found for: "IRON", "TIBC", "IRONPCTSAT" (Iron and TIBC)  No results found for: "FERRITIN"  Urinalysis    Component Value Date/Time   COLORURINE AMBER (A) 01/06/2021 2145   APPEARANCEUR CLEAR 01/06/2021 2145   LABSPEC >1.046 (H) 01/06/2021 2145   PHURINE 5.0 01/06/2021 2145   GLUCOSEU 150 (A) 01/06/2021 2145   HGBUR NEGATIVE 01/06/2021 2145   BILIRUBINUR NEGATIVE 01/06/2021 2145   KETONESUR NEGATIVE 01/06/2021 2145   PROTEINUR 30 (A) 01/06/2021 2145   NITRITE POSITIVE (A) 01/06/2021 2145   LEUKOCYTESUR TRACE (A) 01/06/2021 2145    STUDIES: No results found.  ELIGIBLE FOR AVAILABLE RESEARCH PROTOCOL: no  ASSESSMENT:   67 y.o. [place] woman status post right breast upper inner quadrant biopsy 10/04/2020 for clinical T1b N0, stage IA invasive lobular carcinoma, E-cadherin negative, grade 1, estrogen receptor positive, progesterone receptor and HER2 negative, with an MIB-1 of 10%  (1) status post right lumpectomy and sentinel lymph node sampling 11/05/2020 for a PT1b pN0, stage IA invasive lobular carcinoma, grade 2  (2) Oncotype score of 32 predicts a risk of recurrence outside the breast within the next 9 years of 20% if the patient's only systemic therapy is antiestrogens for 5 years.  It also predicts significant benefit from  chemotherapy.  (3) adjuvant chemotherapy with cyclophosphamide and docetaxel every 21 days x 4 started 12/31/2020, completed 03/03/2021  (4) completed adjuvant radiation and is now on antiestrogen therapy, anastrozole  Ms. Placzek is here for follow-up.  She has been compliant with anastrozole as recommended.  She denies any new breast changes.  She has much better control of her diabetes at this time.  Physical examination today without any concerns.  Most recent mammogram in July with no findings concerning for recurrence. Repeat mammogram in July. I don't believe her dry mouth is related to chemotherapy.  Neuropathy well controlled on amitriptyline Esophageal stenosis status post dilatation. Continue walking, regular exercise. RTC in 6 months.  Total encounter time 30 minutes*   *Total Encounter Time as defined by the Centers for Medicare and Medicaid Services includes, in addition to the face-to-face time of a patient visit (documented in the note above) non-face-to-face time: obtaining and reviewing outside history, ordering and reviewing medications, tests or procedures, care coordination (communications with other health care professionals or caregivers) and documentation in the medical record.

## 2022-08-06 DIAGNOSIS — I10 Essential (primary) hypertension: Secondary | ICD-10-CM | POA: Diagnosis not present

## 2022-08-06 DIAGNOSIS — E1165 Type 2 diabetes mellitus with hyperglycemia: Secondary | ICD-10-CM | POA: Diagnosis not present

## 2022-08-06 DIAGNOSIS — E669 Obesity, unspecified: Secondary | ICD-10-CM | POA: Diagnosis not present

## 2022-08-06 DIAGNOSIS — G4733 Obstructive sleep apnea (adult) (pediatric): Secondary | ICD-10-CM | POA: Diagnosis not present

## 2022-08-10 ENCOUNTER — Ambulatory Visit: Payer: Medicare HMO | Attending: General Surgery

## 2022-08-10 VITALS — Wt 206.4 lb

## 2022-08-10 DIAGNOSIS — Z483 Aftercare following surgery for neoplasm: Secondary | ICD-10-CM

## 2022-08-10 NOTE — Therapy (Signed)
OUTPATIENT PHYSICAL THERAPY SOZO SCREENING NOTE   Patient Name: [name] MRN: [id number] DOB:1955-09-13, 67 y.o., female Today's Date: 08/10/2022  PCP: [name], MD REFERRING PROVIDER: [name], MD   PT End of Session - 08/10/22 1017     Visit Number 3   # unchanged due to screen only   PT Start Time 1014    PT Stop Time 1019    PT Time Calculation (min) 5 min    Activity Tolerance Patient tolerated treatment well    Behavior During Therapy [facility] for tasks assessed/performed             Past Medical History:  Diagnosis Date   Breast cancer (HCC) 10/04/2020   Cancer (HCC)    Cataract    removed   Diabetes mellitus without complication (HCC)    History of kidney stones    Hyperlipidemia    Hypertension    PONV (postoperative nausea and vomiting)    Past Surgical History:  Procedure Laterality Date   ABDOMINAL HYSTERECTOMY     BREAST LUMPECTOMY Right 01/09/2021   Procedure: incision drainage right breast abscess;  Surgeon: [name], MD;  Location: [facility];  Service: General;  Laterality: Right;   BREAST LUMPECTOMY WITH RADIOACTIVE SEED AND SENTINEL LYMPH NODE BIOPSY Right 11/05/2020   Procedure: RIGHT BREAST LUMPECTOMY WITH RADIOACTIVE SEED AND RIGHT AXILLARY SENTINEL LYMPH NODE BIOPSY;  Surgeon: [name], MD;  Location: [facility];  Service: General;  Laterality: Right;   CHOLECYSTECTOMY     IR REMOVAL TUN ACCESS W/ PORT W/O FL MOD SED  08/13/2021   KNEE SURGERY     PORTACATH PLACEMENT Right 12/30/2020   Procedure: INSERTION PORT-A-CATH;  Surgeon: [name], MD;  Location: [facility] OR;  Service: General;  Laterality: Right;   Patient Active Problem List   Diagnosis Date Noted   Port-A-Cath in place 01/23/2021   Uncontrolled type 2 diabetes mellitus with hyperglycemia (HCC) 01/07/2021   SIRS (systemic inflammatory response syndrome) (HCC) 01/06/2021   Malignant neoplasm of upper-inner quadrant of right breast in  female, estrogen receptor positive (HCC) 10/14/2020    REFERRING DIAG: right breast cancer at risk for lymphedema  THERAPY DIAG: Aftercare following surgery for neoplasm  PERTINENT HISTORY: Patient was diagnosed on 09/30/2020 with right grade I invasive lobular carcinoma breast cancer. She underwent a right lumpectomy and sentinel node biopsy (2 negative nodes) on 11/05/2020. It is ER positive, PR negative and HER2 negative with a Ki67 of 10%. Will be starting chemotherapy and then radiation.  PRECAUTIONS: right UE Lymphedema risk, None  SUBJECTIVE: Pt returns for her 3 month L-Dex screen.   PAIN:  Are you having pain? No  SOZO SCREENING: Patient was assessed today using the SOZO machine to determine the lymphedema index score. This was compared to her baseline score. It was determined that she is within the recommended range when compared to her baseline and no further action is needed at this time. She will continue SOZO screenings. These are done every 3 months for 2 years post operatively followed by every 6 months for 2 years, and then annually.    L-DEX FLOWSHEETS - 08/10/22 1000       L-DEX LYMPHEDEMA SCREENING   Measurement Type Unilateral    L-DEX MEASUREMENT EXTREMITY Upper Extremity    POSITION  Standing    DOMINANT SIDE Right    At Risk Side Right    BASELINE SCORE (UNILATERAL) 0.7    L-DEX SCORE (UNILATERAL) 1.1  VALUE CHANGE (UNILAT) 0.4              Hermenia Bers, PTA 08/10/2022, 10:19 AM

## 2022-08-11 ENCOUNTER — Encounter (HOSPITAL_BASED_OUTPATIENT_CLINIC_OR_DEPARTMENT_OTHER): Payer: Self-pay | Admitting: Pharmacist

## 2022-08-11 ENCOUNTER — Other Ambulatory Visit (HOSPITAL_BASED_OUTPATIENT_CLINIC_OR_DEPARTMENT_OTHER): Payer: Self-pay

## 2022-08-11 DIAGNOSIS — G629 Polyneuropathy, unspecified: Secondary | ICD-10-CM | POA: Diagnosis not present

## 2022-08-11 DIAGNOSIS — E7800 Pure hypercholesterolemia, unspecified: Secondary | ICD-10-CM | POA: Diagnosis not present

## 2022-08-11 DIAGNOSIS — E1165 Type 2 diabetes mellitus with hyperglycemia: Secondary | ICD-10-CM | POA: Diagnosis not present

## 2022-08-11 DIAGNOSIS — I10 Essential (primary) hypertension: Secondary | ICD-10-CM | POA: Diagnosis not present

## 2022-08-11 MED ORDER — MOUNJARO 5 MG/0.5ML ~~LOC~~ SOAJ
5.0000 mg | SUBCUTANEOUS | 1 refills | Status: DC
  Filled 2022-08-11: qty 2, 28d supply, fill #0
  Filled 2022-09-11: qty 2, 28d supply, fill #1

## 2022-08-12 DIAGNOSIS — E1165 Type 2 diabetes mellitus with hyperglycemia: Secondary | ICD-10-CM | POA: Diagnosis not present

## 2022-09-11 ENCOUNTER — Other Ambulatory Visit (HOSPITAL_BASED_OUTPATIENT_CLINIC_OR_DEPARTMENT_OTHER): Payer: Self-pay

## 2022-09-14 ENCOUNTER — Telehealth: Payer: Self-pay | Admitting: *Deleted

## 2022-09-14 NOTE — Telephone Encounter (Signed)
Pt left VM stating she is able to apply for exclusion of repayment of student loan relating to her history of breast cancer and therapy and needs Dr Al Pimple to complete a portion of the application.  " This would really alleviate a burden especially since we had multiple medical bills we have had to pay"  This note will be forwarded to MD for review and recommendation.  ( Note pt has 3 children who attended college that they have been paying on)

## 2022-09-23 ENCOUNTER — Encounter: Payer: Self-pay | Admitting: Hematology and Oncology

## 2022-10-05 ENCOUNTER — Telehealth: Payer: Self-pay

## 2022-10-05 ENCOUNTER — Ambulatory Visit
Admission: RE | Admit: 2022-10-05 | Discharge: 2022-10-05 | Disposition: A | Discharge status: Home / Self Care | Payer: Medicare HMO | Source: Ambulatory Visit | Attending: Hematology and Oncology | Admitting: Hematology and Oncology

## 2022-10-05 DIAGNOSIS — Z853 Personal history of malignant neoplasm of breast: Secondary | ICD-10-CM | POA: Diagnosis not present

## 2022-10-05 DIAGNOSIS — C50211 Malignant neoplasm of upper-inner quadrant of right female breast: Secondary | ICD-10-CM

## 2022-10-05 HISTORY — DX: Personal history of antineoplastic chemotherapy: Z92.21

## 2022-10-05 HISTORY — DX: Personal history of irradiation: Z92.3

## 2022-10-05 NOTE — Telephone Encounter (Signed)
Notified Patient of completion of [place] [employer] Form. Copy of form emailed to Patient as requested. No other needs or concerns voiced at this time.

## 2022-10-12 DIAGNOSIS — E7800 Pure hypercholesterolemia, unspecified: Secondary | ICD-10-CM | POA: Diagnosis not present

## 2022-10-12 DIAGNOSIS — E1165 Type 2 diabetes mellitus with hyperglycemia: Secondary | ICD-10-CM | POA: Diagnosis not present

## 2022-10-12 DIAGNOSIS — G629 Polyneuropathy, unspecified: Secondary | ICD-10-CM | POA: Diagnosis not present

## 2022-10-19 ENCOUNTER — Other Ambulatory Visit (HOSPITAL_BASED_OUTPATIENT_CLINIC_OR_DEPARTMENT_OTHER): Payer: Self-pay

## 2022-10-19 DIAGNOSIS — E1165 Type 2 diabetes mellitus with hyperglycemia: Secondary | ICD-10-CM | POA: Diagnosis not present

## 2022-10-19 DIAGNOSIS — C50919 Malignant neoplasm of unspecified site of unspecified female breast: Secondary | ICD-10-CM | POA: Diagnosis not present

## 2022-10-19 DIAGNOSIS — N200 Calculus of kidney: Secondary | ICD-10-CM | POA: Diagnosis not present

## 2022-10-19 DIAGNOSIS — I10 Essential (primary) hypertension: Secondary | ICD-10-CM | POA: Diagnosis not present

## 2022-10-19 DIAGNOSIS — G629 Polyneuropathy, unspecified: Secondary | ICD-10-CM | POA: Diagnosis not present

## 2022-10-19 DIAGNOSIS — E7800 Pure hypercholesterolemia, unspecified: Secondary | ICD-10-CM | POA: Diagnosis not present

## 2022-10-19 MED ORDER — MOUNJARO 5 MG/0.5ML ~~LOC~~ SOAJ
5.0000 mg | SUBCUTANEOUS | 5 refills | Status: DC
  Filled 2022-10-19: qty 2, 28d supply, fill #0
  Filled 2022-11-15: qty 2, 28d supply, fill #1
  Filled 2023-01-06: qty 2, 28d supply, fill #2

## 2022-10-22 IMAGING — MG MM PLC BREAST LOC DEV 1ST LESION INC*R*
7 series · 7 of 7 positions shown · non-contrast
Comparison: Previous exam(s).

CLINICAL DATA: 65-year-old female presenting for radioactive seed
localization of the right breast prior to lumpectomy.

EXAM:
MAMMOGRAPHIC GUIDED RADIOACTIVE SEED LOCALIZATION OF THE RIGHT
BREAST

[R CC (1 of 3)]
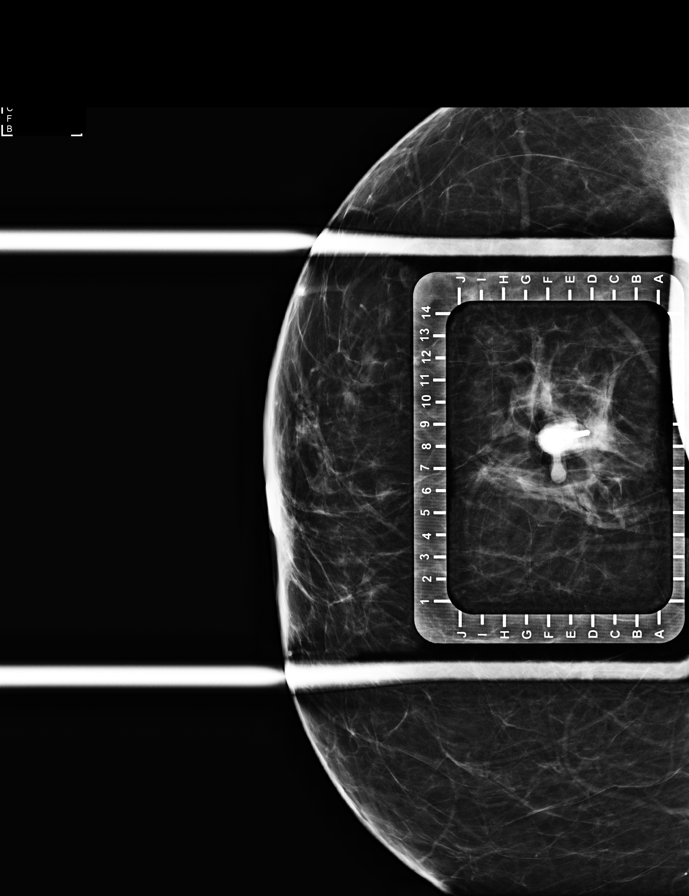

[R ML (1 of 4)]
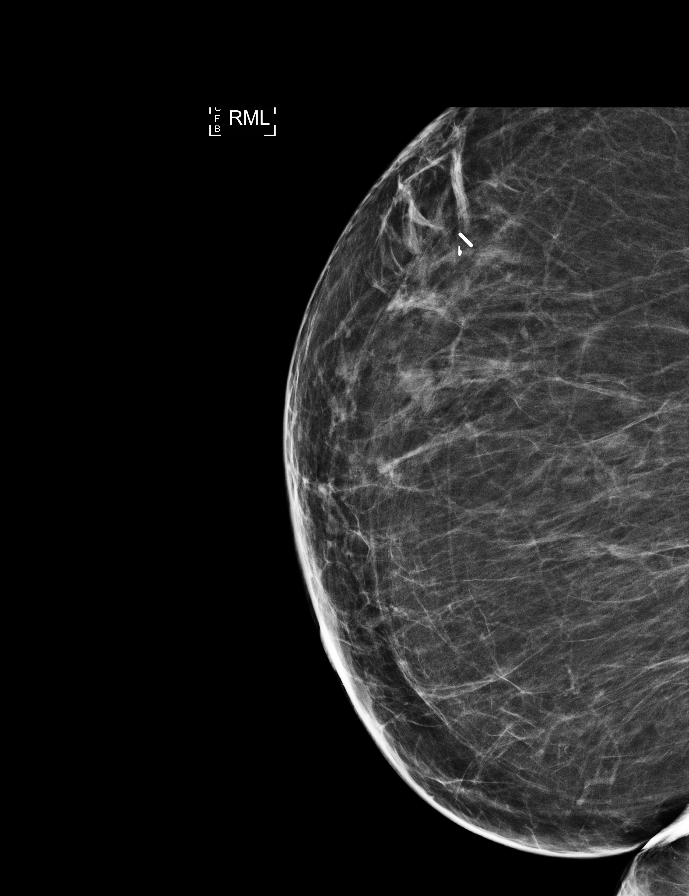

[R ML (2 of 4)]
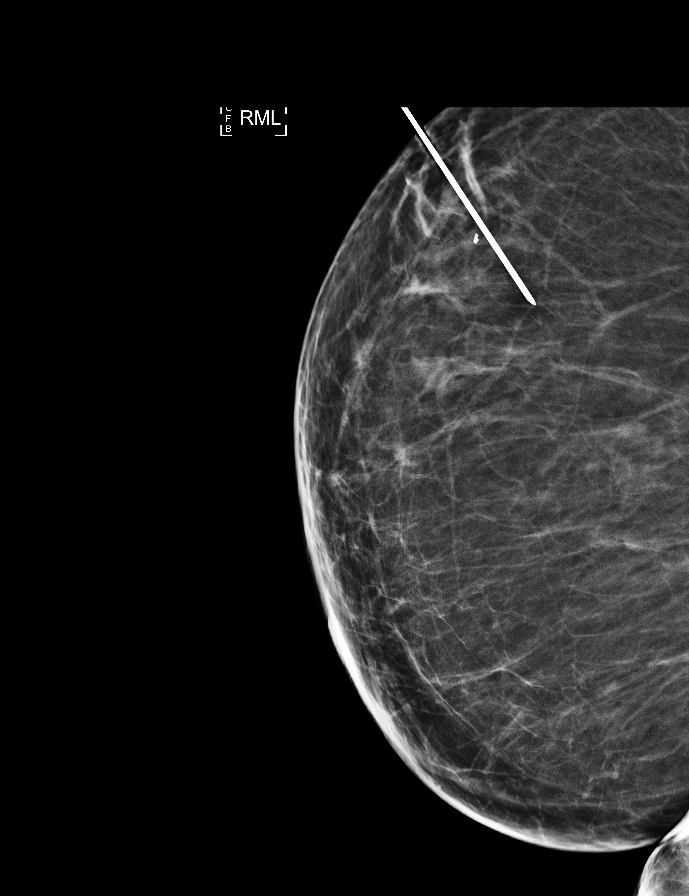

[R ML (3 of 4)]
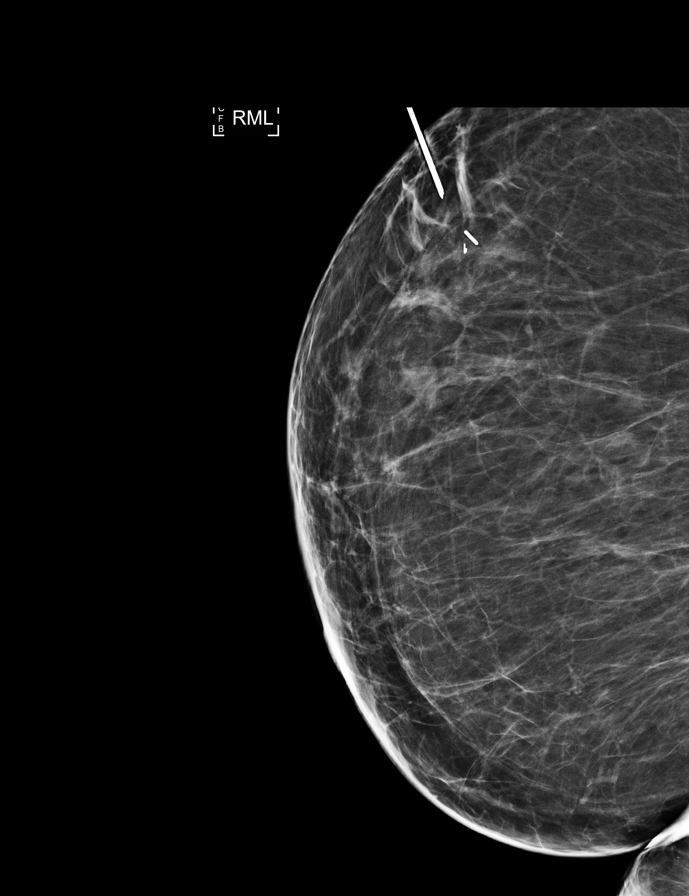

[R CC (2 of 3)]
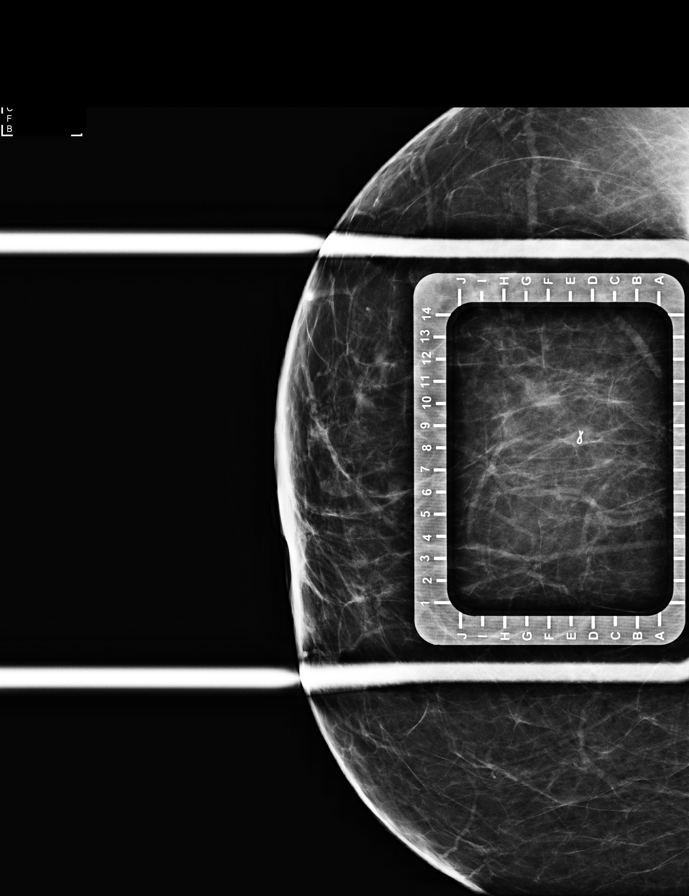

[R CC (3 of 3)]
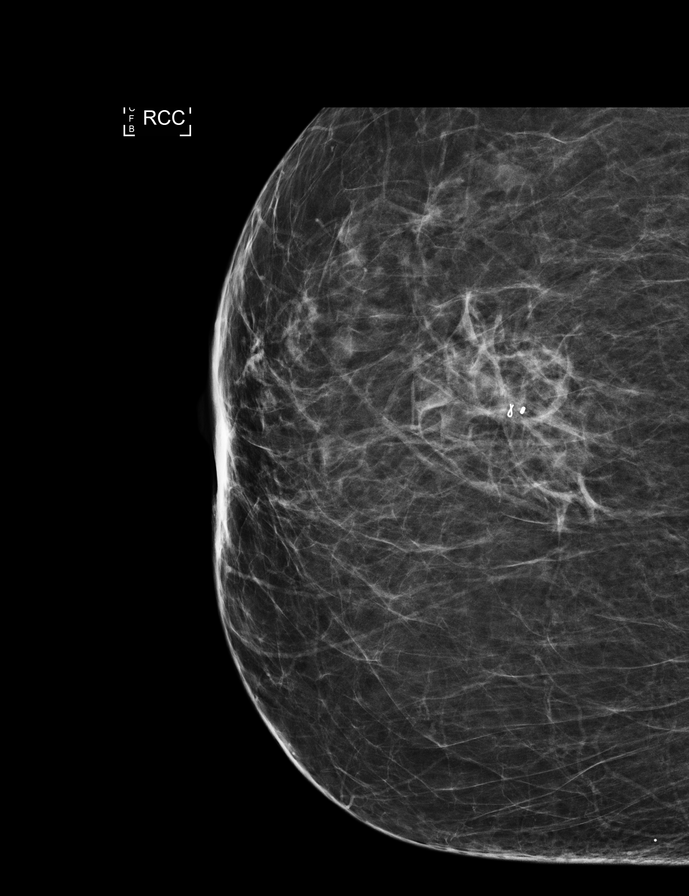

[R ML (4 of 4)]
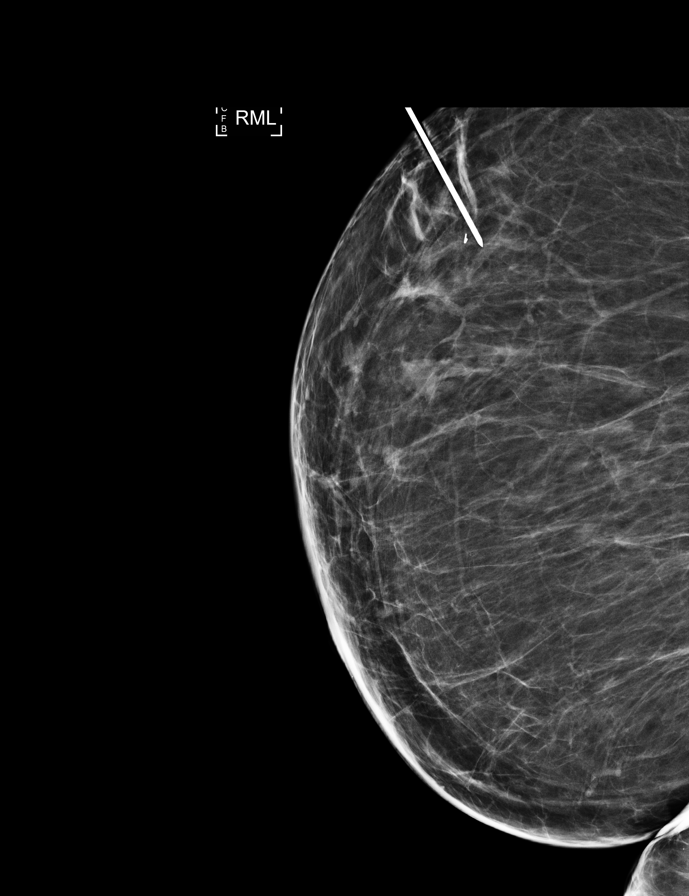

[7 of 7 positions shown; findings below may reference images not displayed]

FINDINGS: Patient presents for radioactive seed localization prior to right
breast lumpectomy. I met with the patient and we discussed the
procedure of seed localization including benefits and alternatives.
We discussed the high likelihood of a successful procedure. We
discussed the risks of the procedure including infection, bleeding,
tissue injury and further surgery. We discussed the low dose of
radioactivity involved in the procedure. Informed, written consent
was given.

The usual time-out protocol was performed immediately prior to the
procedure.

Using mammographic guidance, sterile technique, 1% lidocaine and an
F-N80 radioactive seed, the ribbon shaped biopsy marking clip was
localized using a superior approach. The follow-up mammogram images
confirm the seed in the expected location and were marked for Dr.
Joshjax.

Follow-up survey of the patient confirms presence of the radioactive
seed.

Order number of F-N80 seed:  [id number].

Total activity:  0.247 millicuries reference Date: 10/28/2020

The patient tolerated the procedure well and was released from the
[REDACTED]. She was given instructions regarding seed removal.
IMPRESSION: Radioactive seed localization right breast. No apparent
complications.

## 2022-11-10 DIAGNOSIS — E1165 Type 2 diabetes mellitus with hyperglycemia: Secondary | ICD-10-CM | POA: Diagnosis not present

## 2022-11-16 ENCOUNTER — Other Ambulatory Visit (HOSPITAL_BASED_OUTPATIENT_CLINIC_OR_DEPARTMENT_OTHER): Payer: Self-pay

## 2022-11-30 ENCOUNTER — Ambulatory Visit: Payer: Medicare HMO | Attending: General Surgery

## 2022-11-30 VITALS — Wt 203.2 lb

## 2022-11-30 DIAGNOSIS — Z483 Aftercare following surgery for neoplasm: Secondary | ICD-10-CM | POA: Insufficient documentation

## 2022-11-30 NOTE — Therapy (Signed)
OUTPATIENT PHYSICAL THERAPY SOZO SCREENING NOTE   Patient Name: [name] MRN: [id number] DOB:October 09, 1955, 67 y.o., female Today's Date: 11/30/2022  PCP: [name], MD REFERRING PROVIDER: [name], MD   PT End of Session - 11/30/22 1009     Visit Number 3   # unchanged due to screen only   PT Start Time 1008    PT Stop Time 1017    PT Time Calculation (min) 9 min    Activity Tolerance Patient tolerated treatment well    Behavior During Therapy [facility] for tasks assessed/performed             Past Medical History:  Diagnosis Date   Breast cancer (HCC) 10/04/2020   Cancer (HCC)    Cataract    removed   Diabetes mellitus without complication (HCC)    History of kidney stones    Hyperlipidemia    Hypertension    Personal history of chemotherapy    Personal history of radiation therapy    PONV (postoperative nausea and vomiting)    Past Surgical History:  Procedure Laterality Date   ABDOMINAL HYSTERECTOMY     BREAST BIOPSY Right 10/04/2020   BREAST LUMPECTOMY Right 01/09/2021   Procedure: incision drainage right breast abscess;  Surgeon: [name], MD;  Location: [facility];  Service: General;  Laterality: Right;   BREAST LUMPECTOMY WITH RADIOACTIVE SEED AND SENTINEL LYMPH NODE BIOPSY Right 11/05/2020   Procedure: RIGHT BREAST LUMPECTOMY WITH RADIOACTIVE SEED AND RIGHT AXILLARY SENTINEL LYMPH NODE BIOPSY;  Surgeon: [name], MD;  Location: [facility];  Service: General;  Laterality: Right;   CHOLECYSTECTOMY     IR REMOVAL TUN ACCESS W/ PORT W/O FL MOD SED  08/13/2021   KNEE SURGERY     PORTACATH PLACEMENT Right 12/30/2020   Procedure: INSERTION PORT-A-CATH;  Surgeon: [name], MD;  Location: [facility] OR;  Service: General;  Laterality: Right;   Patient Active Problem List   Diagnosis Date Noted   Port-A-Cath in place 01/23/2021   Uncontrolled type 2 diabetes mellitus with hyperglycemia (HCC) 01/07/2021   SIRS (systemic  inflammatory response syndrome) (HCC) 01/06/2021   Malignant neoplasm of upper-inner quadrant of right breast in female, estrogen receptor positive (HCC) 10/14/2020    REFERRING DIAG: right breast cancer at risk for lymphedema  THERAPY DIAG: Aftercare following surgery for neoplasm  PERTINENT HISTORY: Patient was diagnosed on 09/30/2020 with right grade I invasive lobular carcinoma breast cancer. She underwent a right lumpectomy and sentinel node biopsy (2 negative nodes) on 11/05/2020. It is ER positive, PR negative and HER2 negative with a Ki67 of 10%. Will be starting chemotherapy and then radiation.  PRECAUTIONS: right UE Lymphedema risk, None  SUBJECTIVE: Pt returns for her 3 month L-Dex screen.   PAIN:  Are you having pain? No  SOZO SCREENING: Patient was assessed today using the SOZO machine to determine the lymphedema index score. This was compared to her baseline score. It was determined that she is within the recommended range when compared to her baseline and no further action is needed at this time. She will continue SOZO screenings. These are done every 3 months for 2 years post operatively followed by every 6 months for 2 years, and then annually.    L-DEX FLOWSHEETS - 11/30/22 1000       L-DEX LYMPHEDEMA SCREENING   Measurement Type Unilateral    L-DEX MEASUREMENT EXTREMITY Upper Extremity    POSITION  Standing    DOMINANT SIDE Right  At Risk Side Right    BASELINE SCORE (UNILATERAL) 0.7    L-DEX SCORE (UNILATERAL) 0.8    VALUE CHANGE (UNILAT) 0.1    Comment --              Hermenia Bers, PTA 11/30/2022, 10:17 AM

## 2022-12-09 DIAGNOSIS — Z124 Encounter for screening for malignant neoplasm of cervix: Secondary | ICD-10-CM | POA: Diagnosis not present

## 2022-12-09 DIAGNOSIS — Z1272 Encounter for screening for malignant neoplasm of vagina: Secondary | ICD-10-CM | POA: Diagnosis not present

## 2022-12-09 DIAGNOSIS — Z6832 Body mass index (BMI) 32.0-32.9, adult: Secondary | ICD-10-CM | POA: Diagnosis not present

## 2022-12-14 DIAGNOSIS — R1319 Other dysphagia: Secondary | ICD-10-CM | POA: Diagnosis not present

## 2022-12-14 DIAGNOSIS — K219 Gastro-esophageal reflux disease without esophagitis: Secondary | ICD-10-CM | POA: Diagnosis not present

## 2022-12-14 DIAGNOSIS — Z853 Personal history of malignant neoplasm of breast: Secondary | ICD-10-CM | POA: Diagnosis not present

## 2022-12-17 IMAGING — RF DG CHEST 1V
1 series · 1 of 1 positions shown · non-contrast
Comparison: None.

CLINICAL DATA: Chest port insertion

EXAM:
CHEST  1 VIEW

[Series 1: unknown protocol · 0.20mm/px · 1 of 1 slices shown]
[im 1/1]
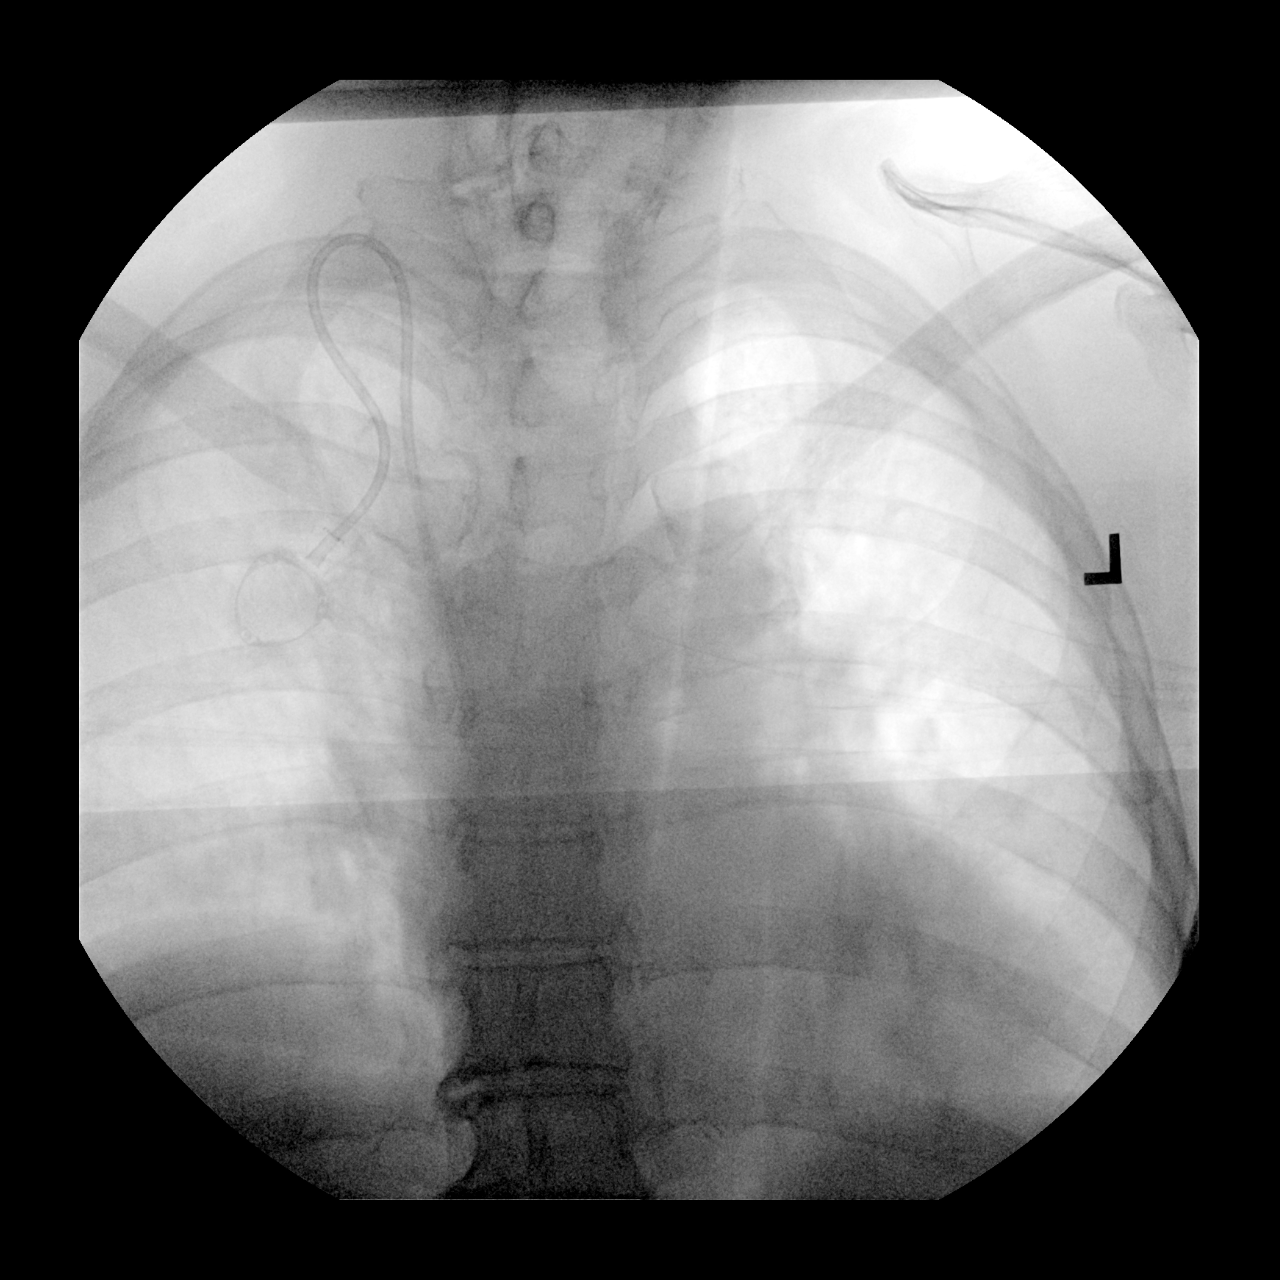

[1 of 1 positions shown; findings below may reference images not displayed]

FINDINGS: Single fluoroscopic image was obtained during the performance of the
procedure and is provided for interpretation only. Image
demonstrates a right chest wall port via internal jugular approach
tip overlying superior vena cava. Please refer to the operative
report.

FLUOROSCOPY TIME:  19 seconds
IMPRESSION: 1. Right chest wall port as above.

## 2023-01-05 DIAGNOSIS — K2950 Unspecified chronic gastritis without bleeding: Secondary | ICD-10-CM | POA: Diagnosis not present

## 2023-01-05 DIAGNOSIS — R1310 Dysphagia, unspecified: Secondary | ICD-10-CM | POA: Diagnosis not present

## 2023-01-06 ENCOUNTER — Other Ambulatory Visit (HOSPITAL_BASED_OUTPATIENT_CLINIC_OR_DEPARTMENT_OTHER): Payer: Self-pay

## 2023-01-13 ENCOUNTER — Other Ambulatory Visit (HOSPITAL_BASED_OUTPATIENT_CLINIC_OR_DEPARTMENT_OTHER): Payer: Self-pay

## 2023-01-13 MED ORDER — MOUNJARO 5 MG/0.5ML ~~LOC~~ SOAJ
5.0000 mg | SUBCUTANEOUS | 5 refills | Status: AC
  Filled 2023-01-13 – 2023-01-30 (×2): qty 2, 28d supply, fill #0
  Filled 2023-03-22: qty 2, 28d supply, fill #1
  Filled 2023-05-07: qty 2, 28d supply, fill #2
  Filled 2023-07-09: qty 2, 28d supply, fill #3

## 2023-01-30 ENCOUNTER — Other Ambulatory Visit (HOSPITAL_BASED_OUTPATIENT_CLINIC_OR_DEPARTMENT_OTHER): Payer: Self-pay

## 2023-02-02 DIAGNOSIS — I10 Essential (primary) hypertension: Secondary | ICD-10-CM | POA: Diagnosis not present

## 2023-02-02 DIAGNOSIS — C50211 Malignant neoplasm of upper-inner quadrant of right female breast: Secondary | ICD-10-CM | POA: Diagnosis not present

## 2023-02-02 DIAGNOSIS — E1142 Type 2 diabetes mellitus with diabetic polyneuropathy: Secondary | ICD-10-CM | POA: Diagnosis not present

## 2023-02-02 DIAGNOSIS — Z794 Long term (current) use of insulin: Secondary | ICD-10-CM | POA: Diagnosis not present

## 2023-02-02 DIAGNOSIS — Z1331 Encounter for screening for depression: Secondary | ICD-10-CM | POA: Diagnosis not present

## 2023-02-02 DIAGNOSIS — Z9989 Dependence on other enabling machines and devices: Secondary | ICD-10-CM | POA: Diagnosis not present

## 2023-02-02 DIAGNOSIS — E782 Mixed hyperlipidemia: Secondary | ICD-10-CM | POA: Diagnosis not present

## 2023-02-02 DIAGNOSIS — Z0000 Encounter for general adult medical examination without abnormal findings: Secondary | ICD-10-CM | POA: Diagnosis not present

## 2023-02-02 DIAGNOSIS — F320 Major depressive disorder, single episode, mild: Secondary | ICD-10-CM | POA: Diagnosis not present

## 2023-02-04 ENCOUNTER — Inpatient Hospital Stay: Payer: Medicare HMO | Admitting: Hematology and Oncology

## 2023-02-05 ENCOUNTER — Other Ambulatory Visit: Payer: Self-pay | Admitting: Hematology and Oncology

## 2023-02-08 DIAGNOSIS — E1165 Type 2 diabetes mellitus with hyperglycemia: Secondary | ICD-10-CM | POA: Diagnosis not present

## 2023-02-09 DIAGNOSIS — G4733 Obstructive sleep apnea (adult) (pediatric): Secondary | ICD-10-CM | POA: Diagnosis not present

## 2023-02-09 DIAGNOSIS — H40013 Open angle with borderline findings, low risk, bilateral: Secondary | ICD-10-CM | POA: Diagnosis not present

## 2023-02-09 DIAGNOSIS — H524 Presbyopia: Secondary | ICD-10-CM | POA: Diagnosis not present

## 2023-02-15 ENCOUNTER — Inpatient Hospital Stay: Payer: Medicare HMO | Attending: Hematology and Oncology | Admitting: Hematology and Oncology

## 2023-02-15 VITALS — BP 147/63 | HR 86 | Temp 98.0°F | Resp 16 | Wt 204.8 lb

## 2023-02-15 DIAGNOSIS — Z803 Family history of malignant neoplasm of breast: Secondary | ICD-10-CM | POA: Diagnosis not present

## 2023-02-15 DIAGNOSIS — K5790 Diverticulosis of intestine, part unspecified, without perforation or abscess without bleeding: Secondary | ICD-10-CM | POA: Diagnosis not present

## 2023-02-15 DIAGNOSIS — Z9221 Personal history of antineoplastic chemotherapy: Secondary | ICD-10-CM | POA: Diagnosis not present

## 2023-02-15 DIAGNOSIS — Z923 Personal history of irradiation: Secondary | ICD-10-CM | POA: Insufficient documentation

## 2023-02-15 DIAGNOSIS — Z800 Family history of malignant neoplasm of digestive organs: Secondary | ICD-10-CM | POA: Diagnosis not present

## 2023-02-15 DIAGNOSIS — G629 Polyneuropathy, unspecified: Secondary | ICD-10-CM | POA: Insufficient documentation

## 2023-02-15 DIAGNOSIS — Z79811 Long term (current) use of aromatase inhibitors: Secondary | ICD-10-CM | POA: Diagnosis not present

## 2023-02-15 DIAGNOSIS — R682 Dry mouth, unspecified: Secondary | ICD-10-CM | POA: Diagnosis not present

## 2023-02-15 DIAGNOSIS — Z170 Estrogen receptor positive status [ER+]: Secondary | ICD-10-CM | POA: Insufficient documentation

## 2023-02-15 DIAGNOSIS — Z79899 Other long term (current) drug therapy: Secondary | ICD-10-CM | POA: Diagnosis not present

## 2023-02-15 DIAGNOSIS — C50211 Malignant neoplasm of upper-inner quadrant of right female breast: Secondary | ICD-10-CM | POA: Diagnosis not present

## 2023-02-15 NOTE — Progress Notes (Signed)
[name]  [phone] Fax:([phone]     ID: [name] DOB: 1955/09/30  MR#: [id number]  [name]:[id number]  Patient Care Team: [name], MD as PCP - General (Hematology and Oncology) [name], RN as Oncology Nurse Navigator [name], RN as Oncology Nurse Navigator [name], MD as Consulting Physician (General Surgery) [name], MD as Attending Physician (Radiation Oncology) [name], MD as Consulting Physician (Obstetrics and Gynecology) [name], MD as Referring Physician (Endocrinology) [name], MD OTHER MD:  CHIEF COMPLAINT: Estrogen receptor positive breast cancer  CURRENT TREATMENT: Adjuvant chemotherapy  INTERVAL HISTORY:  Discussed the use of AI scribe software for clinical note transcription with the patient, who gave verbal consent to proceed.  History of Present Illness    The patient, with a history of breast cancer, presents for a follow-up visit. She reports that her hair has grown back differently after treatment, now thick and curly compared to previously thin and straight. She also mentions experiencing chronic dry mouth, which she believes has led to an increase in cavities. She continues on amitriptyline for neuropathy. Additionally, the patient has been dealing with diverticulosis, which has worsened recently. She underwent an endoscopy and had her esophagus stretched due to damage. Despite these health issues, the patient maintains a positive attitude, stating "nothing I can't handle." She is tolerating anastrozole very well. No major side effects reported.   A detailed review of systems today was otherwise stable.  COVID 19 VACCINATION STATUS: Pfizer x3 as of July 2022   HISTORY OF CURRENT ILLNESS:  From the original intake note:  [name] had routine screening mammography on 09/04/2020 showing a possible abnormality in the right breast. She underwent right  diagnostic mammography with tomography and right breast ultrasonography at [facility] on 09/30/2020 showing: breast density category B; 0.7 cm mass in right breast at 12:30; normal right axillary lymph nodes.  Accordingly on 10/04/2020 she proceeded to biopsy of the right breast area in question. The pathology from this procedure ([id number]) showed: invasive mammary carcinoma, e-cadherin negative, grade 1; atypical lobular hyperplasia. Prognostic indicators significant for: estrogen receptor, 90% positive with moderate staining intensity and progesterone receptor, 0% negative. Proliferation marker Ki67 at 10%. HER2 negative by immunohistochemistry (1+).   Cancer Staging  Malignant neoplasm of upper-inner quadrant of right breast in female, estrogen receptor positive (HCC) Staging form: Breast, AJCC 8th Edition - Clinical stage from 10/16/2020: Stage IA (cT1b, cN0, cM0, G1, ER+, PR-, HER2-) - Signed by [name], MD on 10/16/2020 Stage prefix: Initial diagnosis Histologic grading system: 3 grade system Laterality: Right Staged by: Pathologist and managing physician Stage used in treatment planning: Yes National guidelines used in treatment planning: Yes Type of national guideline used in treatment planning: NCCN   The patient's subsequent history is as detailed below.   PAST MEDICAL HISTORY: Past Medical History:  Diagnosis Date   Breast cancer (HCC) 10/04/2020   Cancer (HCC)    Cataract    removed   Diabetes mellitus without complication (HCC)    History of kidney stones    Hyperlipidemia    Hypertension    Personal history of chemotherapy    Personal history of radiation therapy    PONV (postoperative nausea and vomiting)     PAST SURGICAL HISTORY: Past Surgical History:  Procedure Laterality Date   ABDOMINAL HYSTERECTOMY     BREAST BIOPSY Right 10/04/2020   BREAST LUMPECTOMY Right 01/09/2021   Procedure: incision drainage right  breast abscess;  Surgeon:  [name], MD;  Location: [facility];  Service: General;  Laterality: Right;   BREAST LUMPECTOMY WITH RADIOACTIVE SEED AND SENTINEL LYMPH NODE BIOPSY Right 11/05/2020   Procedure: RIGHT BREAST LUMPECTOMY WITH RADIOACTIVE SEED AND RIGHT AXILLARY SENTINEL LYMPH NODE BIOPSY;  Surgeon: [name], MD;  Location: [facility];  Service: General;  Laterality: Right;   CHOLECYSTECTOMY     IR REMOVAL TUN ACCESS W/ PORT W/O FL MOD SED  08/13/2021   KNEE SURGERY     PORTACATH PLACEMENT Right 12/30/2020   Procedure: INSERTION PORT-A-CATH;  Surgeon: [name], MD;  Location: [facility];  Service: General;  Laterality: Right;    FAMILY HISTORY: Family History  Problem Relation Age of Onset   Heart attack Mother    Diabetes Father    Colon cancer Maternal Grandmother    Breast cancer Cousin        dx early 67's   Her father died at age 36 from diabetes complications. Her mother died at age 40 from MI. Ok has two brothers (and no sisters). She reports breast cancer in a maternal cousin in her early 27's and colon cancer in a maternal grandmother.   GYNECOLOGIC HISTORY:  No LMP recorded. Patient has had a hysterectomy. Menarche: 67 years old Age at first live birth: 67 years old GX P 3 LMP 02/2000 Contraceptive: used from 46 HRT never used  Hysterectomy? Yes, 02/2000 BSO? 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 Husband [name] is a retired Scientific laboratory technician. Son [name], age 12, is a Firefighter and youth pastor in [place]. Daughter [name], age 24, is a high school principal in [place]. Daughter [name], age 78, is a hair stylist here in [place]. [name] has two grandchildren. She attends a [employer].    ADVANCED DIRECTIVES: In the absence of any documentation to the contrary, the patient's spouse is their HCPOA.     HEALTH MAINTENANCE: Social History   Tobacco Use   Smoking status: Former    Current packs/day: 0.00    Average packs/day: 0.5 packs/day for 10.0 years (5.0 ttl pk-yrs)    Types: Cigarettes    Start date: 09/26/1971    Quit date: 09/25/1981    Years since quitting: 41.4   Smokeless tobacco: Never  Vaping Use   Vaping status: Never Used  Substance Use Topics   Alcohol use: Never   Drug use: Never     Colonoscopy: 2018 (Dr. Kinnie Scales)  PAP: 08/2020  Bone density: 08/2020   Allergies  Allergen Reactions   Glimepiride     Hypoglycemia     Current Outpatient Medications  Medication Sig Dispense Refill   ACCU-CHEK GUIDE test strip      acetaminophen (TYLENOL) 500 MG tablet Take 1,000 mg by mouth every 6 (six) hours as needed for moderate pain.     amitriptyline (ELAVIL) 25 MG tablet Take 25 mg by mouth at bedtime.     anastrozole (ARIMIDEX) 1 MG tablet TAKE 1 TABLET EVERY DAY 90 tablet 3   Blood Glucose Monitoring Suppl (ACCU-CHEK GUIDE ME) w/Device KIT      ibuprofen (ADVIL) 200 MG tablet Take 400 mg by mouth every 6 (six) hours as needed for moderate pain or headache.     Insulin Syringe-Needle U-100 (INSULIN SYRINGE .5CC/30GX5/16") 30G X 5/16" 0.5 ML MISC See admin  instructions.     losartan (COZAAR) 25 MG tablet Take 25 mg by mouth daily.     Multiple Vitamin (MULTIVITAMIN WITH MINERALS) TABS tablet Take 1 tablet by mouth daily.     sertraline (ZOLOFT) 50 MG tablet Take 50 mg by mouth daily.     simvastatin (ZOCOR) 40 MG tablet Take 40 mg by mouth at bedtime.     tirzepatide [facility]) 2.5 MG/0.5ML Pen Inject 2.5 mg into the skin once a week.     tirzepatide [facility]) 5 MG/0.5ML Pen Inject 5 mg into the skin once a week. 2 mL 5   tirzepatide (MOUNJARO) 5 MG/0.5ML Pen Inject 5 mg into the skin every 7 (seven) days. 2 mL 5   TOUJEO SOLOSTAR 300 UNIT/ML Solostar Pen Inject 45 Units into the skin daily.     No current facility-administered medications for this visit.     OBJECTIVE: White woman in no acute distress  Vitals:   02/15/23 0933  BP: (!) 147/63  Pulse: 86  Resp: 16  Temp: 98 F (36.7 C)  SpO2: 94%        Body mass index is 32.08 kg/m.   Wt Readings from Last 3 Encounters:  02/15/23 204 lb 12.8 oz (92.9 kg)  11/30/22 203 lb 4 oz (92.2 kg)  08/10/22 206 lb 6 oz (93.6 kg)   Physical Exam Constitutional:      Appearance: Normal appearance.  Chest:     Comments: Bilateral breast exam done, excellent cosmetic outcome in the right breast.  No palpable masses or regional adenopathy. Musculoskeletal:        General: No swelling or tenderness.     Cervical back: Normal range of motion and neck supple. No rigidity.  Neurological:     General: No focal deficit present.     Mental Status: She is alert.      LAB RESULTS:  CMP     Component Value Date/Time   NA 139 02/02/2022 0848   K 4.9 02/02/2022 0848   CL 104 02/02/2022 0848   CO2 28 02/02/2022 0848   GLUCOSE 173 (H) 02/02/2022 0848   BUN 11 02/02/2022 0848   CREATININE 0.73 02/02/2022 0848   CALCIUM 9.5 02/02/2022 0848   PROT 7.2 02/02/2022 0848   ALBUMIN 4.2 02/02/2022 0848   AST 21 02/02/2022 0848   ALT 23 02/02/2022 0848   ALKPHOS 69 02/02/2022 0848   BILITOT 0.3 02/02/2022 0848   GFRNONAA >60 02/02/2022 0848    No results found for: "TOTALPROTELP", "ALBUMINELP", "A1GS", "A2GS", "BETS", "BETA2SER", "GAMS", "MSPIKE", "SPEI"  Lab Results  Component Value Date   WBC 5.4 02/02/2022   NEUTROABS 2.8 02/02/2022   HGB 12.6 02/02/2022   HCT 37.8 02/02/2022   MCV 93.1 02/02/2022   PLT 219 02/02/2022    No results found for: "LABCA2"  No components found for: "WGNFAO130"  No results for input(s): "INR" in the last 168 hours.  No results found for: "LABCA2"  No results found for: "QMV784"  No results found for: "CAN125"  No results found for: "CAN153"  No results found for: "CA2729"  No components found for: "HGQUANT"  No results found for: "CEA1",  "CEA" / No results found for: "CEA1", "CEA"   No results found for: "AFPTUMOR"  No results found for: "CHROMOGRNA"  No results found for: "KPAFRELGTCHN", "LAMBDASER", "KAPLAMBRATIO" (kappa/lambda light chains)  No results found for: "HGBA", "HGBA2QUANT", "HGBFQUANT", "HGBSQUAN" (Hemoglobinopathy evaluation)   No results found for: "LDH"  No results found for: "IRON", "  TIBC", "IRONPCTSAT" (Iron and TIBC)  No results found for: "FERRITIN"  Urinalysis    Component Value Date/Time   COLORURINE AMBER (A) 01/06/2021 2145   APPEARANCEUR CLEAR 01/06/2021 2145   LABSPEC >1.046 (H) 01/06/2021 2145   PHURINE 5.0 01/06/2021 2145   GLUCOSEU 150 (A) 01/06/2021 2145   HGBUR NEGATIVE 01/06/2021 2145   BILIRUBINUR NEGATIVE 01/06/2021 2145   KETONESUR NEGATIVE 01/06/2021 2145   PROTEINUR 30 (A) 01/06/2021 2145   NITRITE POSITIVE (A) 01/06/2021 2145   LEUKOCYTESUR TRACE (A) 01/06/2021 2145    STUDIES: No results found.  ELIGIBLE FOR AVAILABLE RESEARCH PROTOCOL: no  ASSESSMENT:   67 y.o. [place] woman status post right breast upper inner quadrant biopsy 10/04/2020 for clinical T1b N0, stage IA invasive lobular carcinoma, E-cadherin negative, grade 1, estrogen receptor positive, progesterone receptor and HER2 negative, with an MIB-1 of 10%  (1) status post right lumpectomy and sentinel lymph node sampling 11/05/2020 for a PT1b pN0, stage IA invasive lobular carcinoma, grade 2  (2) Oncotype score of 32 predicts a risk of recurrence outside the breast within the next 9 years of 20% if the patient's only systemic therapy is antiestrogens for 5 years.  It also predicts significant benefit from chemotherapy.  (3) adjuvant chemotherapy with cyclophosphamide and docetaxel every 21 days x 4 started 12/31/2020, completed 03/03/2021  (4) completed adjuvant radiation and is now on antiestrogen therapy, anastrozole  PLAN  Breast Cancer No changes in breasts. Mammogram due in July.  Continues on Anastrozole. -Continue Anastrozole. -Schedule mammogram for July.  Neuropathy Reports improvement. Currently on Amitriptyline. -Continue Amitriptyline.  Dry Mouth Reports chronic dry mouth, possibly exacerbated by Amitriptyline. -Monitor and manage symptoms.  Diverticulosis Reports worsening symptoms. Recent endoscopy revealed esophageal damage. -Manage symptoms and monitor condition.  Bone Health Last bone density scan was the previous year and normal. Advised to take Vitamin D and Calcium. -Start Vitamin D and Calcium supplements.  General Health Maintenance -Encouraged to walk 30 minutes five times a week. -Return in six months for follow-up.  Total encounter time 30 minutes*   *Total Encounter Time as defined by the Centers for Medicare and Medicaid Services includes, in addition to the face-to-face time of a patient visit (documented in the note above) non-face-to-face time: obtaining and reviewing outside history, ordering and reviewing medications, tests or procedures, care coordination (communications with other health care professionals or caregivers) and documentation in the medical record.

## 2023-02-16 ENCOUNTER — Encounter: Payer: Self-pay | Admitting: Obstetrics and Gynecology

## 2023-02-16 DIAGNOSIS — E7800 Pure hypercholesterolemia, unspecified: Secondary | ICD-10-CM | POA: Diagnosis not present

## 2023-02-16 DIAGNOSIS — E1165 Type 2 diabetes mellitus with hyperglycemia: Secondary | ICD-10-CM | POA: Diagnosis not present

## 2023-02-23 DIAGNOSIS — E1165 Type 2 diabetes mellitus with hyperglycemia: Secondary | ICD-10-CM | POA: Diagnosis not present

## 2023-02-23 DIAGNOSIS — N200 Calculus of kidney: Secondary | ICD-10-CM | POA: Diagnosis not present

## 2023-02-23 DIAGNOSIS — G629 Polyneuropathy, unspecified: Secondary | ICD-10-CM | POA: Diagnosis not present

## 2023-02-23 DIAGNOSIS — I10 Essential (primary) hypertension: Secondary | ICD-10-CM | POA: Diagnosis not present

## 2023-02-23 DIAGNOSIS — C50919 Malignant neoplasm of unspecified site of unspecified female breast: Secondary | ICD-10-CM | POA: Diagnosis not present

## 2023-02-23 DIAGNOSIS — E7800 Pure hypercholesterolemia, unspecified: Secondary | ICD-10-CM | POA: Diagnosis not present

## 2023-03-09 DIAGNOSIS — H5213 Myopia, bilateral: Secondary | ICD-10-CM | POA: Diagnosis not present

## 2023-03-09 DIAGNOSIS — H52209 Unspecified astigmatism, unspecified eye: Secondary | ICD-10-CM | POA: Diagnosis not present

## 2023-03-09 DIAGNOSIS — H524 Presbyopia: Secondary | ICD-10-CM | POA: Diagnosis not present

## 2023-03-09 DIAGNOSIS — H5203 Hypermetropia, bilateral: Secondary | ICD-10-CM | POA: Diagnosis not present

## 2023-03-09 DIAGNOSIS — H04123 Dry eye syndrome of bilateral lacrimal glands: Secondary | ICD-10-CM | POA: Diagnosis not present

## 2023-03-09 DIAGNOSIS — H40013 Open angle with borderline findings, low risk, bilateral: Secondary | ICD-10-CM | POA: Diagnosis not present

## 2023-03-22 ENCOUNTER — Other Ambulatory Visit (HOSPITAL_BASED_OUTPATIENT_CLINIC_OR_DEPARTMENT_OTHER): Payer: Self-pay

## 2023-04-12 DIAGNOSIS — M5416 Radiculopathy, lumbar region: Secondary | ICD-10-CM | POA: Diagnosis not present

## 2023-04-13 ENCOUNTER — Other Ambulatory Visit: Payer: Self-pay | Admitting: Orthopedic Surgery

## 2023-04-13 DIAGNOSIS — G4733 Obstructive sleep apnea (adult) (pediatric): Secondary | ICD-10-CM | POA: Diagnosis not present

## 2023-04-13 DIAGNOSIS — M545 Low back pain, unspecified: Secondary | ICD-10-CM

## 2023-04-25 ENCOUNTER — Ambulatory Visit
Admission: RE | Admit: 2023-04-25 | Discharge: 2023-04-25 | Disposition: A | Discharge status: Home / Self Care | Payer: Medicare HMO | Source: Ambulatory Visit | Attending: Orthopedic Surgery | Admitting: Orthopedic Surgery

## 2023-04-25 DIAGNOSIS — M48061 Spinal stenosis, lumbar region without neurogenic claudication: Secondary | ICD-10-CM | POA: Diagnosis not present

## 2023-04-25 DIAGNOSIS — M47816 Spondylosis without myelopathy or radiculopathy, lumbar region: Secondary | ICD-10-CM | POA: Diagnosis not present

## 2023-04-25 DIAGNOSIS — M5126 Other intervertebral disc displacement, lumbar region: Secondary | ICD-10-CM | POA: Diagnosis not present

## 2023-04-25 DIAGNOSIS — M545 Low back pain, unspecified: Secondary | ICD-10-CM

## 2023-04-25 DIAGNOSIS — M419 Scoliosis, unspecified: Secondary | ICD-10-CM | POA: Diagnosis not present

## 2023-04-26 ENCOUNTER — Other Ambulatory Visit: Payer: Self-pay | Admitting: Orthopedic Surgery

## 2023-04-26 DIAGNOSIS — M545 Low back pain, unspecified: Secondary | ICD-10-CM

## 2023-05-03 DIAGNOSIS — M545 Low back pain, unspecified: Secondary | ICD-10-CM | POA: Diagnosis not present

## 2023-05-07 ENCOUNTER — Other Ambulatory Visit (HOSPITAL_BASED_OUTPATIENT_CLINIC_OR_DEPARTMENT_OTHER): Payer: Self-pay

## 2023-05-09 DIAGNOSIS — E1165 Type 2 diabetes mellitus with hyperglycemia: Secondary | ICD-10-CM | POA: Diagnosis not present

## 2023-05-12 ENCOUNTER — Ambulatory Visit
Admission: RE | Admit: 2023-05-12 | Discharge: 2023-05-12 | Disposition: A | Discharge status: Home / Self Care | Payer: Medicare HMO | Source: Ambulatory Visit | Attending: Orthopedic Surgery | Admitting: Orthopedic Surgery

## 2023-05-12 DIAGNOSIS — M533 Sacrococcygeal disorders, not elsewhere classified: Secondary | ICD-10-CM | POA: Diagnosis not present

## 2023-05-12 DIAGNOSIS — M545 Low back pain, unspecified: Secondary | ICD-10-CM

## 2023-05-14 ENCOUNTER — Inpatient Hospital Stay (HOSPITAL_COMMUNITY)
Admission: EM | Admit: 2023-05-14 | Discharge: 2023-05-22 | DRG: 854 | Disposition: A | Discharge status: Home / Self Care | Payer: Medicare HMO | Attending: Internal Medicine | Admitting: Internal Medicine

## 2023-05-14 ENCOUNTER — Encounter (HOSPITAL_COMMUNITY): Payer: Self-pay | Admitting: Emergency Medicine

## 2023-05-14 ENCOUNTER — Emergency Department (HOSPITAL_COMMUNITY): Payer: Medicare HMO

## 2023-05-14 DIAGNOSIS — Z923 Personal history of irradiation: Secondary | ICD-10-CM

## 2023-05-14 DIAGNOSIS — Z853 Personal history of malignant neoplasm of breast: Secondary | ICD-10-CM | POA: Diagnosis not present

## 2023-05-14 DIAGNOSIS — Z87891 Personal history of nicotine dependence: Secondary | ICD-10-CM | POA: Diagnosis not present

## 2023-05-14 DIAGNOSIS — E785 Hyperlipidemia, unspecified: Secondary | ICD-10-CM | POA: Diagnosis present

## 2023-05-14 DIAGNOSIS — K760 Fatty (change of) liver, not elsewhere classified: Secondary | ICD-10-CM | POA: Diagnosis present

## 2023-05-14 DIAGNOSIS — Z6832 Body mass index (BMI) 32.0-32.9, adult: Secondary | ICD-10-CM

## 2023-05-14 DIAGNOSIS — N390 Urinary tract infection, site not specified: Secondary | ICD-10-CM | POA: Diagnosis present

## 2023-05-14 DIAGNOSIS — E669 Obesity, unspecified: Secondary | ICD-10-CM | POA: Diagnosis present

## 2023-05-14 DIAGNOSIS — E1165 Type 2 diabetes mellitus with hyperglycemia: Secondary | ICD-10-CM | POA: Diagnosis present

## 2023-05-14 DIAGNOSIS — Z79899 Other long term (current) drug therapy: Secondary | ICD-10-CM

## 2023-05-14 DIAGNOSIS — Z9221 Personal history of antineoplastic chemotherapy: Secondary | ICD-10-CM

## 2023-05-14 DIAGNOSIS — Z87442 Personal history of urinary calculi: Secondary | ICD-10-CM

## 2023-05-14 DIAGNOSIS — K56609 Unspecified intestinal obstruction, unspecified as to partial versus complete obstruction: Principal | ICD-10-CM | POA: Diagnosis present

## 2023-05-14 DIAGNOSIS — Z23 Encounter for immunization: Secondary | ICD-10-CM

## 2023-05-14 DIAGNOSIS — R1084 Generalized abdominal pain: Secondary | ICD-10-CM | POA: Diagnosis not present

## 2023-05-14 DIAGNOSIS — I809 Phlebitis and thrombophlebitis of unspecified site: Secondary | ICD-10-CM | POA: Diagnosis present

## 2023-05-14 DIAGNOSIS — M7989 Other specified soft tissue disorders: Secondary | ICD-10-CM | POA: Diagnosis not present

## 2023-05-14 DIAGNOSIS — K5669 Other partial intestinal obstruction: Secondary | ICD-10-CM | POA: Diagnosis not present

## 2023-05-14 DIAGNOSIS — K5790 Diverticulosis of intestine, part unspecified, without perforation or abscess without bleeding: Secondary | ICD-10-CM | POA: Diagnosis present

## 2023-05-14 DIAGNOSIS — Z90710 Acquired absence of both cervix and uterus: Secondary | ICD-10-CM

## 2023-05-14 DIAGNOSIS — Z794 Long term (current) use of insulin: Secondary | ICD-10-CM

## 2023-05-14 DIAGNOSIS — Z86718 Personal history of other venous thrombosis and embolism: Secondary | ICD-10-CM

## 2023-05-14 DIAGNOSIS — Z7985 Long-term (current) use of injectable non-insulin antidiabetic drugs: Secondary | ICD-10-CM

## 2023-05-14 DIAGNOSIS — Z800 Family history of malignant neoplasm of digestive organs: Secondary | ICD-10-CM | POA: Diagnosis not present

## 2023-05-14 DIAGNOSIS — E119 Type 2 diabetes mellitus without complications: Secondary | ICD-10-CM | POA: Diagnosis not present

## 2023-05-14 DIAGNOSIS — Z833 Family history of diabetes mellitus: Secondary | ICD-10-CM

## 2023-05-14 DIAGNOSIS — A419 Sepsis, unspecified organism: Secondary | ICD-10-CM | POA: Diagnosis present

## 2023-05-14 DIAGNOSIS — Z8249 Family history of ischemic heart disease and other diseases of the circulatory system: Secondary | ICD-10-CM | POA: Diagnosis not present

## 2023-05-14 DIAGNOSIS — I82611 Acute embolism and thrombosis of superficial veins of right upper extremity: Secondary | ICD-10-CM | POA: Diagnosis not present

## 2023-05-14 DIAGNOSIS — Z803 Family history of malignant neoplasm of breast: Secondary | ICD-10-CM | POA: Diagnosis not present

## 2023-05-14 DIAGNOSIS — K5730 Diverticulosis of large intestine without perforation or abscess without bleeding: Secondary | ICD-10-CM | POA: Diagnosis not present

## 2023-05-14 DIAGNOSIS — I10 Essential (primary) hypertension: Secondary | ICD-10-CM | POA: Diagnosis present

## 2023-05-14 DIAGNOSIS — K5900 Constipation, unspecified: Secondary | ICD-10-CM | POA: Diagnosis present

## 2023-05-14 DIAGNOSIS — Z888 Allergy status to other drugs, medicaments and biological substances status: Secondary | ICD-10-CM

## 2023-05-14 DIAGNOSIS — K219 Gastro-esophageal reflux disease without esophagitis: Secondary | ICD-10-CM | POA: Diagnosis present

## 2023-05-14 DIAGNOSIS — Z5986 Financial insecurity: Secondary | ICD-10-CM

## 2023-05-14 DIAGNOSIS — K5651 Intestinal adhesions [bands], with partial obstruction: Secondary | ICD-10-CM | POA: Diagnosis present

## 2023-05-14 DIAGNOSIS — R000 Tachycardia, unspecified: Secondary | ICD-10-CM | POA: Diagnosis not present

## 2023-05-14 DIAGNOSIS — Z4682 Encounter for fitting and adjustment of non-vascular catheter: Secondary | ICD-10-CM | POA: Diagnosis not present

## 2023-05-14 LAB — COMPREHENSIVE METABOLIC PANEL
ALT: 35 U/L (ref 0–44)
AST: 33 U/L (ref 15–41)
Albumin: 4.6 g/dL (ref 3.5–5.0)
Alkaline Phosphatase: 104 U/L (ref 38–126)
Anion gap: 14 (ref 5–15)
BUN: 17 mg/dL (ref 8–23)
CO2: 21 mmol/L — ABNORMAL LOW (ref 22–32)
Calcium: 10.2 mg/dL (ref 8.9–10.3)
Chloride: 101 mmol/L (ref 98–111)
Creatinine, Ser: 0.84 mg/dL (ref 0.44–1.00)
GFR, Estimated: >60 mL/min (ref >60)
Glucose, Bld: 212 mg/dL — ABNORMAL HIGH (ref 70–99)
Potassium: 3.9 mmol/L (ref 3.5–5.1)
Sodium: 136 mmol/L (ref 135–145)
Total Bilirubin: 0.9 mg/dL (ref 0.0–1.2)
Total Protein: 8.6 g/dL — ABNORMAL HIGH (ref 6.5–8.1)

## 2023-05-14 LAB — CBC
HCT: 46.5 % — ABNORMAL HIGH (ref 36.0–46.0)
Hemoglobin: 15.9 g/dL — ABNORMAL HIGH (ref 12.0–15.0)
MCH: 31 pg (ref 26.0–34.0)
MCHC: 34.2 g/dL (ref 30.0–36.0)
MCV: 90.6 fL (ref 80.0–100.0)
Platelets: 322 10*3/uL (ref 150–400)
RBC: 5.13 MIL/uL — ABNORMAL HIGH (ref 3.87–5.11)
RDW: 13.7 % (ref 11.5–15.5)
WBC: 13.5 10*3/uL — ABNORMAL HIGH (ref 4.0–10.5)
nRBC: 0 % (ref 0.0–0.2)

## 2023-05-14 LAB — URINALYSIS, ROUTINE W REFLEX MICROSCOPIC
Bilirubin Urine: NEGATIVE
Glucose, UA: NEGATIVE mg/dL
Ketones, ur: NEGATIVE mg/dL
Nitrite: NEGATIVE
Protein, ur: 100 mg/dL — AB
Specific Gravity, Urine: 1.029 (ref 1.005–1.030)
WBC, UA: >50 WBC/hpf (ref 0–5)
pH: 5 (ref 5.0–8.0)

## 2023-05-14 LAB — LIPASE, BLOOD: Lipase: 22 U/L (ref 11–51)

## 2023-05-14 MED ORDER — CEFTRIAXONE SODIUM 1 G IJ SOLR
1.0000 g | Freq: Once | INTRAMUSCULAR | Status: AC
  Administered 2023-05-15: 1 g via INTRAVENOUS
  Filled 2023-05-14: qty 10

## 2023-05-14 MED ORDER — LACTATED RINGERS IV BOLUS
1000.0000 mL | Freq: Once | INTRAVENOUS | Status: AC
  Administered 2023-05-14: 1000 mL via INTRAVENOUS

## 2023-05-14 MED ORDER — SODIUM CHLORIDE 0.9 % IV SOLN
12.5000 mg | Freq: Once | INTRAVENOUS | Status: AC
  Administered 2023-05-14: 12.5 mg via INTRAVENOUS
  Filled 2023-05-14: qty 12.5

## 2023-05-14 MED ORDER — IOHEXOL 300 MG/ML  SOLN
100.0000 mL | Freq: Once | INTRAMUSCULAR | Status: AC | PRN
  Administered 2023-05-14: 100 mL via INTRAVENOUS

## 2023-05-14 MED ORDER — ONDANSETRON 4 MG PO TBDP
4.0000 mg | ORAL_TABLET | Freq: Once | ORAL | Status: AC | PRN
  Administered 2023-05-14: 4 mg via ORAL
  Filled 2023-05-14: qty 1

## 2023-05-14 MED ORDER — MORPHINE SULFATE (PF) 4 MG/ML IV SOLN
4.0000 mg | Freq: Once | INTRAVENOUS | Status: AC
  Administered 2023-05-14: 4 mg via INTRAVENOUS
  Filled 2023-05-14: qty 1

## 2023-05-14 NOTE — ED Provider Notes (Signed)
[place] EMERGENCY DEPARTMENT AT [facility] Provider Note   CSN: [id number] Arrival date & time: 05/14/23  2010     History Chief Complaint  Patient presents with   Abdominal Pain   Emesis    HPI [name] is a 68 y.o. female presenting for abdominal pain and sudden onset nausea vomiting abdominal pain today. Denies fevers chills nausea vomiting syncope shortness of breath  Patient's recorded medical, surgical, social, medication list and allergies were reviewed in the Snapshot window as part of the initial history.   Review of Systems   Review of Systems  Constitutional:  Negative for chills and fever.  HENT:  Negative for ear pain and sore throat.   Eyes:  Negative for pain and visual disturbance.  Respiratory:  Negative for cough and shortness of breath.   Cardiovascular:  Negative for chest pain and palpitations.  Gastrointestinal:  Positive for abdominal pain, nausea and vomiting.  Genitourinary:  Negative for dysuria and hematuria.  Musculoskeletal:  Negative for arthralgias and back pain.  Skin:  Negative for color change and rash.  Neurological:  Negative for seizures and syncope.  All other systems reviewed and are negative.   Physical Exam Updated Vital Signs BP (!) 159/75   Pulse 96   Temp 98.1 F (36.7 C) (Oral)   Resp 16   Ht 5\' 7"  (1.702 m)   Wt 89.4 kg   SpO2 95%   BMI 30.85 kg/m  Physical Exam Vitals and nursing note reviewed.  Constitutional:      General: She is not in acute distress.    Appearance: She is well-developed.  HENT:     Head: Normocephalic and atraumatic.  Eyes:     Conjunctiva/sclera: Conjunctivae normal.  Cardiovascular:     Rate and Rhythm: Normal rate and regular rhythm.     Heart sounds: No murmur heard. Pulmonary:     Effort: Pulmonary effort is normal. No respiratory distress.     Breath sounds: Normal breath sounds.  Abdominal:     General: There is no distension.     Palpations: Abdomen is soft.      Tenderness: There is abdominal tenderness in the right upper quadrant. There is no right CVA tenderness or left CVA tenderness.  Musculoskeletal:        General: No swelling or tenderness. Normal range of motion.     Cervical back: Neck supple.  Skin:    General: Skin is warm and dry.  Neurological:     General: No focal deficit present.     Mental Status: She is alert and oriented to person, place, and time. Mental status is at baseline.     Cranial Nerves: No cranial nerve deficit.      ED Course/ Medical Decision Making/ A&P    Procedures Procedures   Medications Ordered in ED Medications  cefTRIAXone (ROCEPHIN) 1 g in sodium chloride 0.9 % 100 mL IVPB (has no administration in time range)  ondansetron (ZOFRAN-ODT) disintegrating tablet 4 mg (4 mg Oral Given 05/14/23 2052)  lactated ringers bolus 1,000 mL (1,000 mLs Intravenous New Bag/Given 05/14/23 2242)  morphine (PF) 4 MG/ML injection 4 mg (4 mg Intravenous Given 05/14/23 2254)  promethazine (PHENERGAN) 12.5 mg in sodium chloride 0.9 % 50 mL IVPB (0 mg Intravenous Stopped 05/14/23 2319)  iohexol (OMNIPAQUE) 300 MG/ML solution 100 mL (100 mLs Intravenous Contrast Given 05/14/23 2306)    Medical Decision Making:   [name] is a 68 y.o. female who  presented to the ED today with abdominal pain, detailed above.    Patient placed on continuous vitals and telemetry monitoring while in ED which was reviewed periodically.  Complete initial physical exam performed, notably the patient  was HDS in NAD.     Reviewed and confirmed nursing documentation for past medical history, family history, social history.    Initial Assessment:   With the patient's presentation of abdominal pain, most likely diagnosis is nonspecific etiology. Other diagnoses were considered including (but not limited to) gastroenteritis, colitis, small bowel obstruction, appendicitis, cholecystitis, pancreatitis, nephrolithiasis, UTI, pyleonephritis. These are  considered less likely due to history of present illness and physical exam findings.   This is most consistent with an acute life/limb threatening illness complicated by underlying chronic conditions.   Initial Plan:  CBC/CMP to evaluate for underlying infectious/metabolic etiology for patient's abdominal pain  Lipase to evaluate for pancreatitis  EKG to evaluate for cardiac source of pain  CTAB/Pelvis with contrast to evaluate for structural/surgical etiology of patients' severe abdominal pain.  Urinalysis and repeat physical assessment to evaluate for UTI/Pyelonpehritis  Empiric management of symptoms with escalating pain control and antiemetics as needed.   Initial Study Results:   Laboratory  All laboratory results reviewed without evidence of clinically relevant pathology.    Radiology All images reviewed independently. Agree with radiology report at this time.   CT ABDOMEN PELVIS W CONTRAST Result Date: 05/14/2023 CLINICAL DATA:  Generalized abdominal pain, vomiting. EXAM: CT ABDOMEN AND PELVIS WITH CONTRAST TECHNIQUE: Multidetector CT imaging of the abdomen and pelvis was performed using the standard protocol following bolus administration of intravenous contrast. RADIATION DOSE REDUCTION: This exam was performed according to the departmental dose-optimization program which includes automated exposure control, adjustment of the mA and/or kV according to patient size and/or use of iterative reconstruction technique. CONTRAST:  OMNIPAQUE IOHEXOL 300 MG/ML  SOLN COMPARISON:  01/06/2021 FINDINGS: Lower chest: Dependent atelectasis in the lower lobes. Coronary artery and aortic atherosclerosis. Hepatobiliary: Prior cholecystectomy. Diffuse fatty infiltration of the liver. No focal hepatic abnormality. Pancreas: No focal abnormality or ductal dilatation. Spleen: No focal abnormality.  Normal size. Adrenals/Urinary Tract: No adrenal abnormality. No focal renal abnormality. No stones or  hydronephrosis. Urinary bladder is unremarkable. Stomach/Bowel: Normal appendix. With scattered left colonic diverticulosis. No active diverticulitis. Dilated fluid-filled small bowel loops into the pelvis. Distal small bowel is decompressed. Findings concerning for small bowel obstruction. Transition appears to be in the midline of the lower pelvis, presumably adhesion related. Stomach is moderately distended with fluid. Vascular/Lymphatic: No evidence of aneurysm or adenopathy. Scattered aortic calcifications. Reproductive: Prior hysterectomy.  No adnexal masses. Other: Trace free fluid in the cul-de-sac.  No free air. Musculoskeletal: No acute bony abnormality IMPRESSION: Distended, fluid-filled stomach and small bowel into the pelvis. Distal small bowel is decompressed. Findings most compatible with distal small bowel obstruction with transition in the midline of the lower pelvis. Scattered left colonic diverticulosis. No active diverticulitis. Hepatic steatosis. Aortic atherosclerosis. Electronically Signed   By: Charlett Nose M.D.   On: 05/14/2023 23:33   CT BIOPSY Result Date: 05/12/2023 CLINICAL DATA:  Daily right SI pain after lifting injury 2023 EXAM: RIGHT CT GUIDED SI JOINT INJECTION PROCEDURE: After a thorough discussion of risks and benefits of the procedure, including bleeding, infection, injury to nerves, blood vessels, and adjacent structures, verbal and written consent was obtained. Specific risks of the procedure included nondiagnostic/nontherapeutic injection and non target injection. The patient was placed prone on the CT table and  localization was performed over the sacrum. Target site marked using CT guidance. The skin was prepped and draped in the usual sterile fashion using Betadine soap. After local anesthesia with 1% lidocaine without epinephrine and subsequent deep anesthesia, a 22g spinal needle was advanced into the posterior inferior aspect right SI joint under intermittent CT  guidance. Once the needle was in satisfactory position, representative image was captured with the needle demonstrated in the sacroiliac joint. Subsequently, 2 mL bupivacaine 0.5% was injected into the right SI joint. Needles removed and a sterile dressing applied. No complications were observed. RADIATION DOSE REDUCTION: This exam was performed according to the departmental dose-optimization program which includes automated exposure control, adjustment of the mA and/or kV according to patient size and/or use of iterative reconstruction technique. IMPRESSION: Successful CT guided right SI joint injection with bupivacaine. Electronically Signed   By: Corlis Leak M.D.   On: 05/12/2023 08:30   MR LUMBAR SPINE WO CONTRAST Result Date: 05/09/2023 CLINICAL DATA:  Lumbar region back pain over the last year. Pain extends to the right leg. EXAM: MRI LUMBAR SPINE WITHOUT CONTRAST TECHNIQUE: Multiplanar, multisequence MR imaging of the lumbar spine was performed. No intravenous contrast was administered. COMPARISON:  CT abdomen 01/06/2021 FINDINGS: Segmentation:  5 lumbar type vertebral bodies. Alignment: Scoliotic curvature convex to the right with the apex at L2. 2 mm of degenerative anterolisthesis at L5-S1. Vertebrae: No fracture or focal bone lesion. No edematous endplate marrow changes or edematous facet arthritis. Conus medullaris and cauda equina: Conus extends to the L1 level. Conus and cauda equina appear normal. Paraspinal and other soft tissues: Negative Disc levels: T11-12: Disc bulge. Mild facet and ligamentous hypertrophy. No compressive stenosis. T12-L1: Normal L1-2: Mild bulging of the disc. Mild facet hypertrophy on the left. No compressive stenosis. L2-3: Disc degeneration on the left. Endplate osteophytes and bulging of the disc. Mild facet and ligamentous hypertrophy. Mild narrowing of the left lateral recess but no visible neural compression. L3-4: Mild bulging of the disc. Bilateral facet degeneration  and hypertrophy. Mild stenosis of both lateral recesses, but no visible neural compression. L4-5: Moderate bulging of the disc more prominent towards the right. Bilateral facet and ligamentous hypertrophy. Mild narrowing of the right lateral recess. Moderate right foraminal narrowing. Some potential that in particular the right L4 nerve could be affected at this level. L5-S1: Bilateral facet osteoarthritis with 2 mm of anterolisthesis. No disc herniation. No compressive stenosis of the canal or foramina. IMPRESSION: 1. Scoliotic curvature convex to the right with the apex at L2. Multilevel degenerative disc disease and degenerative facet disease which could relate to regional nonspecific pain. 2. L4-5: Moderate bulging of the disc more prominent towards the right. Bilateral facet and ligamentous hypertrophy. Mild narrowing of the right lateral recess. Moderate right foraminal narrowing. Some potential that in particular the right L4 nerve could be affected at this level. 3. L3-4: Mild bulging of the disc. Bilateral facet degeneration and hypertrophy. Mild stenosis of both lateral recesses, but no visible neural compression. 4. L2-3: Disc degeneration on the left. Endplate osteophytes and bulging of the disc. Mild facet and ligamentous hypertrophy. Mild narrowing of the left lateral recess but no visible neural compression. 5. L5-S1: Bilateral facet osteoarthritis with 2 mm of anterolisthesis. No compressive stenosis of the canal or foramina. Electronically Signed   By: Paulina Fusi M.D.   On: 05/09/2023 18:07     Consults: Message sent to overnight general surgery coverage for a.m. evaluation.  Does not require immediate evaluation.  Final Reassessment and Plan:   Patient's history of present illness feels open exam is consistent with acute UTI and a small bowel obstruction. Treating with Rocephin and blood culture. Will admit to medicine for observation overnight.  Asynchronous consultation with general  surgery.  Disposition:   Based on the above findings, I believe this patient is stable for admission.    Patient/family educated about specific findings on our evaluation and explained exact reasons for admission.  Patient/family educated about clinical situation and time was allowed to answer questions.   Admission team communicated with and agreed with need for admission. Patient admitted. Patient ready to move at this time.     Emergency Department Medication Summary:   Medications  cefTRIAXone (ROCEPHIN) 1 g in sodium chloride 0.9 % 100 mL IVPB (has no administration in time range)  ondansetron (ZOFRAN-ODT) disintegrating tablet 4 mg (4 mg Oral Given 05/14/23 2052)  lactated ringers bolus 1,000 mL (1,000 mLs Intravenous New Bag/Given 05/14/23 2242)  morphine (PF) 4 MG/ML injection 4 mg (4 mg Intravenous Given 05/14/23 2254)  promethazine (PHENERGAN) 12.5 mg in sodium chloride 0.9 % 50 mL IVPB (0 mg Intravenous Stopped 05/14/23 2319)  iohexol (OMNIPAQUE) 300 MG/ML solution 100 mL (100 mLs Intravenous Contrast Given 05/14/23 2306)               Clinical Impression:  1. SBO (small bowel obstruction) (HCC)      Admit   Final Clinical Impression(s) / ED Diagnoses Final diagnoses:  SBO (small bowel obstruction) (HCC)    Rx / DC Orders ED Discharge Orders     None         Glyn Ade, MD 05/14/23 2348

## 2023-05-14 NOTE — ED Triage Notes (Signed)
Pt in with generalized abdominal pain and emesis (4 episodes since last night). Pt mentions hx of breast CA remission and states she had an injection for S1 joint on 2/19. Hx of DM, and sugars have been higher than her norm.

## 2023-05-15 ENCOUNTER — Inpatient Hospital Stay (HOSPITAL_COMMUNITY): Payer: Medicare HMO

## 2023-05-15 ENCOUNTER — Encounter (HOSPITAL_COMMUNITY): Payer: Self-pay | Admitting: Internal Medicine

## 2023-05-15 ENCOUNTER — Other Ambulatory Visit: Payer: Self-pay

## 2023-05-15 DIAGNOSIS — K56609 Unspecified intestinal obstruction, unspecified as to partial versus complete obstruction: Secondary | ICD-10-CM | POA: Diagnosis present

## 2023-05-15 DIAGNOSIS — K5669 Other partial intestinal obstruction: Secondary | ICD-10-CM | POA: Diagnosis not present

## 2023-05-15 DIAGNOSIS — E1165 Type 2 diabetes mellitus with hyperglycemia: Secondary | ICD-10-CM | POA: Diagnosis present

## 2023-05-15 DIAGNOSIS — N390 Urinary tract infection, site not specified: Secondary | ICD-10-CM | POA: Diagnosis present

## 2023-05-15 DIAGNOSIS — Z833 Family history of diabetes mellitus: Secondary | ICD-10-CM | POA: Diagnosis not present

## 2023-05-15 DIAGNOSIS — Z923 Personal history of irradiation: Secondary | ICD-10-CM | POA: Diagnosis not present

## 2023-05-15 DIAGNOSIS — I82611 Acute embolism and thrombosis of superficial veins of right upper extremity: Secondary | ICD-10-CM | POA: Diagnosis not present

## 2023-05-15 DIAGNOSIS — I10 Essential (primary) hypertension: Secondary | ICD-10-CM | POA: Diagnosis present

## 2023-05-15 DIAGNOSIS — Z9221 Personal history of antineoplastic chemotherapy: Secondary | ICD-10-CM | POA: Diagnosis not present

## 2023-05-15 DIAGNOSIS — E119 Type 2 diabetes mellitus without complications: Secondary | ICD-10-CM | POA: Diagnosis not present

## 2023-05-15 DIAGNOSIS — Z794 Long term (current) use of insulin: Secondary | ICD-10-CM | POA: Diagnosis not present

## 2023-05-15 DIAGNOSIS — Z7985 Long-term (current) use of injectable non-insulin antidiabetic drugs: Secondary | ICD-10-CM | POA: Diagnosis not present

## 2023-05-15 DIAGNOSIS — I809 Phlebitis and thrombophlebitis of unspecified site: Secondary | ICD-10-CM | POA: Diagnosis present

## 2023-05-15 DIAGNOSIS — Z800 Family history of malignant neoplasm of digestive organs: Secondary | ICD-10-CM | POA: Diagnosis not present

## 2023-05-15 DIAGNOSIS — K760 Fatty (change of) liver, not elsewhere classified: Secondary | ICD-10-CM | POA: Diagnosis present

## 2023-05-15 DIAGNOSIS — K5651 Intestinal adhesions [bands], with partial obstruction: Secondary | ICD-10-CM | POA: Diagnosis present

## 2023-05-15 DIAGNOSIS — Z4682 Encounter for fitting and adjustment of non-vascular catheter: Secondary | ICD-10-CM | POA: Diagnosis not present

## 2023-05-15 DIAGNOSIS — A419 Sepsis, unspecified organism: Secondary | ICD-10-CM | POA: Diagnosis present

## 2023-05-15 DIAGNOSIS — Z5986 Financial insecurity: Secondary | ICD-10-CM | POA: Diagnosis not present

## 2023-05-15 DIAGNOSIS — M7989 Other specified soft tissue disorders: Secondary | ICD-10-CM | POA: Diagnosis not present

## 2023-05-15 DIAGNOSIS — Z87891 Personal history of nicotine dependence: Secondary | ICD-10-CM | POA: Diagnosis not present

## 2023-05-15 DIAGNOSIS — E785 Hyperlipidemia, unspecified: Secondary | ICD-10-CM | POA: Diagnosis present

## 2023-05-15 DIAGNOSIS — Z6832 Body mass index (BMI) 32.0-32.9, adult: Secondary | ICD-10-CM | POA: Diagnosis not present

## 2023-05-15 DIAGNOSIS — Z803 Family history of malignant neoplasm of breast: Secondary | ICD-10-CM | POA: Diagnosis not present

## 2023-05-15 DIAGNOSIS — Z8249 Family history of ischemic heart disease and other diseases of the circulatory system: Secondary | ICD-10-CM | POA: Diagnosis not present

## 2023-05-15 DIAGNOSIS — E669 Obesity, unspecified: Secondary | ICD-10-CM | POA: Diagnosis present

## 2023-05-15 DIAGNOSIS — Z853 Personal history of malignant neoplasm of breast: Secondary | ICD-10-CM | POA: Diagnosis not present

## 2023-05-15 DIAGNOSIS — Z23 Encounter for immunization: Secondary | ICD-10-CM | POA: Diagnosis present

## 2023-05-15 DIAGNOSIS — Z86718 Personal history of other venous thrombosis and embolism: Secondary | ICD-10-CM | POA: Diagnosis not present

## 2023-05-15 LAB — GLUCOSE, CAPILLARY
Glucose-Capillary: 131 mg/dL — ABNORMAL HIGH (ref 70–99)
Glucose-Capillary: 98 mg/dL (ref 70–99)
Glucose-Capillary: 101 mg/dL — ABNORMAL HIGH (ref 70–99)
Glucose-Capillary: 99 mg/dL (ref 70–99)

## 2023-05-15 LAB — URINALYSIS, W/ REFLEX TO CULTURE (INFECTION SUSPECTED)
Bilirubin Urine: NEGATIVE
Glucose, UA: NEGATIVE mg/dL
Hgb urine dipstick: NEGATIVE
Ketones, ur: NEGATIVE mg/dL
Nitrite: NEGATIVE
Protein, ur: NEGATIVE mg/dL
Specific Gravity, Urine: >1.046 — ABNORMAL HIGH (ref 1.005–1.030)
WBC, UA: >50 WBC/hpf (ref 0–5)
pH: 5 (ref 5.0–8.0)

## 2023-05-15 LAB — BASIC METABOLIC PANEL
Anion gap: 12 (ref 5–15)
BUN: 18 mg/dL (ref 8–23)
CO2: 24 mmol/L (ref 22–32)
Calcium: 9.6 mg/dL (ref 8.9–10.3)
Chloride: 102 mmol/L (ref 98–111)
Creatinine, Ser: 0.82 mg/dL (ref 0.44–1.00)
GFR, Estimated: >60 mL/min (ref >60)
Glucose, Bld: 145 mg/dL — ABNORMAL HIGH (ref 70–99)
Potassium: 4.2 mmol/L (ref 3.5–5.1)
Sodium: 138 mmol/L (ref 135–145)

## 2023-05-15 LAB — CBC
HCT: 44 % (ref 36.0–46.0)
Hemoglobin: 14.6 g/dL (ref 12.0–15.0)
MCH: 30.7 pg (ref 26.0–34.0)
MCHC: 33.2 g/dL (ref 30.0–36.0)
MCV: 92.6 fL (ref 80.0–100.0)
Platelets: 271 10*3/uL (ref 150–400)
RBC: 4.75 MIL/uL (ref 3.87–5.11)
RDW: 13.8 % (ref 11.5–15.5)
WBC: 12.2 10*3/uL — ABNORMAL HIGH (ref 4.0–10.5)
nRBC: 0 % (ref 0.0–0.2)

## 2023-05-15 LAB — HIV ANTIBODY (ROUTINE TESTING W REFLEX): HIV Screen 4th Generation wRfx: NONREACTIVE

## 2023-05-15 LAB — PHOSPHORUS: Phosphorus: 5.1 mg/dL — ABNORMAL HIGH (ref 2.5–4.6)

## 2023-05-15 LAB — HEMOGLOBIN A1C
Hgb A1c MFr Bld: 7.6 % — ABNORMAL HIGH (ref 4.8–5.6)
Mean Plasma Glucose: 171.42 mg/dL

## 2023-05-15 LAB — CBG MONITORING, ED
Glucose-Capillary: 115 mg/dL — ABNORMAL HIGH (ref 70–99)
Glucose-Capillary: 156 mg/dL — ABNORMAL HIGH (ref 70–99)
Glucose-Capillary: 172 mg/dL — ABNORMAL HIGH (ref 70–99)

## 2023-05-15 LAB — MAGNESIUM: Magnesium: 2.3 mg/dL (ref 1.7–2.4)

## 2023-05-15 LAB — CREATININE, SERUM
Creatinine, Ser: 0.78 mg/dL (ref 0.44–1.00)
GFR, Estimated: >60 mL/min (ref >60)

## 2023-05-15 MED ORDER — FENTANYL CITRATE PF 50 MCG/ML IJ SOSY
12.5000 ug | PREFILLED_SYRINGE | INTRAMUSCULAR | Status: DC | PRN
  Administered 2023-05-17 – 2023-05-18 (×4): 12.5 ug via INTRAVENOUS
  Filled 2023-05-15 (×4): qty 1

## 2023-05-15 MED ORDER — KETOROLAC TROMETHAMINE 15 MG/ML IJ SOLN
15.0000 mg | Freq: Four times a day (QID) | INTRAMUSCULAR | Status: DC | PRN
  Administered 2023-05-15 – 2023-05-18 (×6): 15 mg via INTRAVENOUS
  Filled 2023-05-15 (×6): qty 1

## 2023-05-15 MED ORDER — PNEUMOCOCCAL 20-VAL CONJ VACC 0.5 ML IM SUSY
0.5000 mL | PREFILLED_SYRINGE | INTRAMUSCULAR | Status: AC
  Administered 2023-05-16: 0.5 mL via INTRAMUSCULAR
  Filled 2023-05-15: qty 0.5

## 2023-05-15 MED ORDER — ENOXAPARIN SODIUM 40 MG/0.4ML IJ SOSY
40.0000 mg | PREFILLED_SYRINGE | INTRAMUSCULAR | Status: DC
  Administered 2023-05-15 – 2023-05-22 (×6): 40 mg via SUBCUTANEOUS
  Filled 2023-05-15 (×6): qty 0.4

## 2023-05-15 MED ORDER — BISACODYL 10 MG RE SUPP: 10.0000 mg | Freq: Every day | RECTAL | Status: DC | PRN

## 2023-05-15 MED ORDER — DIATRIZOATE MEGLUMINE & SODIUM 66-10 % PO SOLN
90.0000 mL | Freq: Once | ORAL | Status: AC
  Administered 2023-05-15: 90 mL via NASOGASTRIC
  Filled 2023-05-15: qty 90

## 2023-05-15 MED ORDER — MORPHINE SULFATE (PF) 2 MG/ML IV SOLN: 1.0000 mg | INTRAVENOUS | Status: DC | PRN

## 2023-05-15 MED ORDER — LACTATED RINGERS IV SOLN: INTRAVENOUS | Status: AC

## 2023-05-15 MED ORDER — PROMETHAZINE (PHENERGAN) 6.25MG IN NS 50ML IVPB
6.2500 mg | INTRAVENOUS | Status: AC
  Administered 2023-05-15: 6.25 mg via INTRAVENOUS
  Filled 2023-05-15: qty 6.25

## 2023-05-15 MED ORDER — SODIUM CHLORIDE 0.9 % IV SOLN
1.0000 g | INTRAVENOUS | Status: DC
  Administered 2023-05-15 – 2023-05-17 (×3): 1 g via INTRAVENOUS
  Filled 2023-05-15 (×3): qty 10

## 2023-05-15 MED ORDER — PROCHLORPERAZINE EDISYLATE 10 MG/2ML IJ SOLN
5.0000 mg | Freq: Four times a day (QID) | INTRAMUSCULAR | Status: DC | PRN
  Administered 2023-05-15 – 2023-05-18 (×9): 5 mg via INTRAVENOUS
  Filled 2023-05-15 (×9): qty 2

## 2023-05-15 MED ORDER — INSULIN ASPART 100 UNIT/ML IJ SOLN
0.0000 [IU] | INTRAMUSCULAR | Status: DC
  Administered 2023-05-15: 1 [IU] via SUBCUTANEOUS
  Administered 2023-05-15 (×2): 2 [IU] via SUBCUTANEOUS
  Administered 2023-05-16 – 2023-05-19 (×3): 1 [IU] via SUBCUTANEOUS
  Administered 2023-05-20: 2 [IU] via SUBCUTANEOUS
  Administered 2023-05-20: 1 [IU] via SUBCUTANEOUS
  Administered 2023-05-20 (×2): 2 [IU] via SUBCUTANEOUS
  Administered 2023-05-21: 3 [IU] via SUBCUTANEOUS
  Administered 2023-05-21: 2 [IU] via SUBCUTANEOUS
  Administered 2023-05-21: 1 [IU] via SUBCUTANEOUS
  Administered 2023-05-21: 2 [IU] via SUBCUTANEOUS
  Filled 2023-05-15: qty 0.09

## 2023-05-15 NOTE — Plan of Care (Signed)

## 2023-05-15 NOTE — H&P (Signed)
 History and Physical  [name] ZOX:[id number] DOB: 1955/12/18 DOA: 05/14/2023  Referring physician: Dr. Doran Durand, EDP  PCP: Tally Joe, MD  Outpatient Specialists: GI Patient coming from: Home.  Chief Complaint: Abdominal pain, nausea and vomiting  HPI: [name] is a 68 y.o. female with medical history significant for obesity, type 2 diabetes on Mounjaro, diverticulosis, who presents with sudden onset abdominal pain.  Associated with nausea and vomiting and 1 loose stool today.  Denies fevers or chills.  In the ER, tachycardic.  UA positive for pyuria.  CT abdomen and pelvis revealed, findings most compatible with distal small bowel obstruction with transition in the midline of the lower pelvis.  No evidence of diverticulitis.  Hepatic steatosis.  EDP consulted general surgery.  TRH, hospitalist service, was asked to admit for management of SBO and presumptive UTI.  ED Course: Temperature 98.  BP 125/60, pulse 115, respiration rate 16.  O2 saturation 94% on room air.  Review of Systems: Review of systems as noted in the HPI. All other systems reviewed and are negative.   Past Medical History:  Diagnosis Date   Breast cancer (HCC) 10/04/2020   Cancer (HCC)    Cataract    removed   Diabetes mellitus without complication (HCC)    History of kidney stones    Hyperlipidemia    Hypertension    Personal history of chemotherapy    Personal history of radiation therapy    PONV (postoperative nausea and vomiting)    Past Surgical History:  Procedure Laterality Date   ABDOMINAL HYSTERECTOMY     BREAST BIOPSY Right 10/04/2020   BREAST LUMPECTOMY Right 01/09/2021   Procedure: incision drainage right breast abscess;  Surgeon: [name], MD;  Location: [facility];  Service: General;  Laterality: Right;   BREAST LUMPECTOMY WITH RADIOACTIVE SEED AND SENTINEL LYMPH NODE BIOPSY Right 11/05/2020   Procedure: RIGHT BREAST LUMPECTOMY WITH RADIOACTIVE SEED AND RIGHT AXILLARY  SENTINEL LYMPH NODE BIOPSY;  Surgeon: [name], MD;  Location: [facility];  Service: General;  Laterality: Right;   CHOLECYSTECTOMY     IR REMOVAL TUN ACCESS W/ PORT W/O FL MOD SED  08/13/2021   KNEE SURGERY     PORTACATH PLACEMENT Right 12/30/2020   Procedure: INSERTION PORT-A-CATH;  Surgeon: [name], MD;  Location: [facility] OR;  Service: General;  Laterality: Right;    Social History:  reports that she quit smoking about 41 years ago. Her smoking use included cigarettes. She started smoking about 51 years ago. She has a 5 pack-year smoking history. She has never used smokeless tobacco. She reports that she does not drink alcohol and does not use drugs.   Allergies  Allergen Reactions   Glimepiride     Hypoglycemia     Family History  Problem Relation Age of Onset   Heart attack Mother    Diabetes Father    Colon cancer Maternal Grandmother    Breast cancer Cousin        dx early 58's      Prior to Admission medications   Medication Sig Start Date End Date Taking? Authorizing Provider  ACCU-CHEK GUIDE test strip  07/23/20   [provider]  acetaminophen (TYLENOL) 500 MG tablet Take 1,000 mg by mouth every 6 (six) hours as needed for moderate pain.    [provider]  amitriptyline (ELAVIL) 25 MG tablet Take 25 mg by mouth at bedtime.    [provider]  anastrozole (ARIMIDEX) 1 MG tablet  TAKE 1 TABLET EVERY DAY 02/05/23   [name], MD  Blood Glucose Monitoring Suppl (ACCU-CHEK GUIDE ME) w/Device KIT  07/23/20   [provider]  ibuprofen (ADVIL) 200 MG tablet Take 400 mg by mouth every 6 (six) hours as needed for moderate pain or headache.    [provider]  Insulin Syringe-Needle U-100 (INSULIN SYRINGE .5CC/30GX5/16") 30G X 5/16" 0.5 ML MISC See admin instructions. 04/23/20   [provider]  losartan (COZAAR) 25 MG tablet Take 25 mg by mouth daily.    [provider]  Multiple  Vitamin (MULTIVITAMIN WITH MINERALS) TABS tablet Take 1 tablet by mouth daily.    [provider]  sertraline (ZOLOFT) 50 MG tablet Take 50 mg by mouth daily.    [provider]  simvastatin (ZOCOR) 40 MG tablet Take 40 mg by mouth at bedtime.    [provider]  tirzepatide Greggory Keen) 2.5 MG/0.5ML Pen Inject 2.5 mg into the skin once a week.    [provider]  tirzepatide Greggory Keen) 5 MG/0.5ML Pen Inject 5 mg into the skin once a week. 10/19/22     tirzepatide (MOUNJARO) 5 MG/0.5ML Pen Inject 5 mg into the skin every 7 (seven) days. 01/13/23     TOUJEO SOLOSTAR 300 UNIT/ML Solostar Pen Inject 45 Units into the skin daily. 07/22/20   [provider]    Physical Exam: BP (!) 159/75   Pulse 96   Temp 98.1 F (36.7 C) (Oral)   Resp 16   Ht 5\' 7"  (1.702 m)   Wt 89.4 kg   SpO2 95%   BMI 30.85 kg/m   General: 68 y.o. year-old female well developed well nourished in no acute distress.  Alert and oriented x3. Cardiovascular: Tachycardic with no rubs or gallops.  No thyromegaly or JVD noted.  No lower extremity edema. 2/4 pulses in all 4 extremities. Respiratory: Clear to auscultation with no wheezes or rales. Good inspiratory effort. Abdomen: Distended with hypoactive bowel sounds x4 quadrants. Muskuloskeletal: No cyanosis, clubbing or edema noted bilaterally Neuro: CN II-XII intact, strength, sensation, reflexes Skin: No ulcerative lesions noted or rashes Psychiatry: Judgement and insight appear normal. Mood is appropriate for condition and setting          Labs on Admission:  Basic Metabolic Panel: Recent Labs  Lab 05/14/23 2101  NA 136  K 3.9  CL 101  CO2 21*  GLUCOSE 212*  BUN 17  CREATININE 0.84  CALCIUM 10.2   Liver Function Tests: Recent Labs  Lab 05/14/23 2101  AST 33  ALT 35  ALKPHOS 104  BILITOT 0.9  PROT 8.6*  ALBUMIN 4.6   Recent Labs  Lab 05/14/23 2101  LIPASE 22   No results for input(s): "AMMONIA" in the  last 168 hours. CBC: Recent Labs  Lab 05/14/23 2101  WBC 13.5*  HGB 15.9*  HCT 46.5*  MCV 90.6  PLT 322   Cardiac Enzymes: No results for input(s): "CKTOTAL", "CKMB", "CKMBINDEX", "TROPONINI" in the last 168 hours.  BNP (last 3 results) No results for input(s): "BNP" in the last 8760 hours.  ProBNP (last 3 results) No results for input(s): "PROBNP" in the last 8760 hours.  CBG: No results for input(s): "GLUCAP" in the last 168 hours.  Radiological Exams on Admission: CT ABDOMEN PELVIS W CONTRAST Result Date: 05/14/2023 CLINICAL DATA:  Generalized abdominal pain, vomiting. EXAM: CT ABDOMEN AND PELVIS WITH CONTRAST TECHNIQUE: Multidetector CT imaging of the abdomen and pelvis was performed using the standard protocol  following bolus administration of intravenous contrast. RADIATION DOSE REDUCTION: This exam was performed according to the departmental dose-optimization program which includes automated exposure control, adjustment of the mA and/or kV according to patient size and/or use of iterative reconstruction technique. CONTRAST:  OMNIPAQUE IOHEXOL 300 MG/ML  SOLN COMPARISON:  01/06/2021 FINDINGS: Lower chest: Dependent atelectasis in the lower lobes. Coronary artery and aortic atherosclerosis. Hepatobiliary: Prior cholecystectomy. Diffuse fatty infiltration of the liver. No focal hepatic abnormality. Pancreas: No focal abnormality or ductal dilatation. Spleen: No focal abnormality.  Normal size. Adrenals/Urinary Tract: No adrenal abnormality. No focal renal abnormality. No stones or hydronephrosis. Urinary bladder is unremarkable. Stomach/Bowel: Normal appendix. With scattered left colonic diverticulosis. No active diverticulitis. Dilated fluid-filled small bowel loops into the pelvis. Distal small bowel is decompressed. Findings concerning for small bowel obstruction. Transition appears to be in the midline of the lower pelvis, presumably adhesion related. Stomach is moderately  distended with fluid. Vascular/Lymphatic: No evidence of aneurysm or adenopathy. Scattered aortic calcifications. Reproductive: Prior hysterectomy.  No adnexal masses. Other: Trace free fluid in the cul-de-sac.  No free air. Musculoskeletal: No acute bony abnormality IMPRESSION: Distended, fluid-filled stomach and small bowel into the pelvis. Distal small bowel is decompressed. Findings most compatible with distal small bowel obstruction with transition in the midline of the lower pelvis. Scattered left colonic diverticulosis. No active diverticulitis. Hepatic steatosis. Aortic atherosclerosis. Electronically Signed   By: Charlett Nose M.D.   On: 05/14/2023 23:33    EKG: I independently viewed the EKG done and my findings are as followed: Sinus tachycardia rate of 104.  Nonspecific ST-T changes.  QTc 475.  Assessment/Plan Present on Admission:  SBO (small bowel obstruction) (HCC)  Principal Problem:   SBO (small bowel obstruction) (HCC)  SBO, suspect secondary to adhesions Multiple prior abdominal surgeries General Surgery consulted by EDP Continue IV fluid LR at 100 cc/h x 2 days Optimize magnesium level greater than 2.0 and potassium greater than 4.0. N.p.o. except minimal ice chips NG tube placed in the ED with SBO protocol Follow abdominal x-ray to monitor progress of oral contrast Mobilize often  Presumptive UTI, POA SIRS, WBC 13.5, heart rate 105 UA positive for pyuria Monitor urine culture for ID and sensitivities Continue Rocephin empirically started in the ED.  Type 2 diabetes with hyperglycemia Last hemoglobin A1c 8.7 on 12/19/2020 The patient is on Mounjaro, hold off, delays gastric emptying Start insulin sliding scale every 4 hours while NPO  Obesity BMI 30 Recommend weight loss outpatient with regular physical activity and healthy dieting.   Time: 75 minutes.   DVT prophylaxis: Subcu Lovenox daily.  Code Status: Full code.  Family Communication: Updated the  patient's husband at bedside.  Disposition Plan: Admitted to MedSurg unit.  Consults called: General Surgery consulted by EDP.  Admission status: Inpatient status.   Status is: Inpatient The patient requires at least 2 midnights for further evaluation and treatment of present condition.   Darlin Drop MD [facility] Pager [phone]  If 7PM-7AM, please contact night-coverage [email or web address] Password [facility]  05/15/2023, 12:13 AM

## 2023-05-15 NOTE — Consult Note (Signed)
 Surgical Evaluation Requesting provider: Glyn Ade MD  Chief Complaint: Abdominal pain  HPI: 68 year old woman with history of diabetes on Mounjaro, hypertension, hyperlipidemia, breast cancer/post chemoradiation 2022, and previous abdominal surgery including abdominal hysterectomy and cholecystectomy who presented to the emergency department yesterday evening with generalized abdominal pain associated with 4 episodes of nonbloody emesis and 1 loose bowel movement.  This was acute onset.  Workup in the emergency department notable for acute UTI and small bowel obstruction, the latter of which surgery is consulted for further recommendations.  Initial lab work without AKI or acidosis but white count of 13.5; vitals on presentation notable for no fever, heart rate in the 90s, hypertensive.  Fluid resuscitation and NG tube already been ordered. She reports no prior similar episodes but does intermittently struggle with constipation.  Some relief with NG decompression, but still feeling rough this morning.  Pain significantly improved.  Allergies  Allergen Reactions   Glimepiride     Hypoglycemia     Past Medical History:  Diagnosis Date   Breast cancer (HCC) 10/04/2020   Cancer (HCC)    Cataract    removed   Diabetes mellitus without complication (HCC)    History of kidney stones    Hyperlipidemia    Hypertension    Personal history of chemotherapy    Personal history of radiation therapy    PONV (postoperative nausea and vomiting)     Past Surgical History:  Procedure Laterality Date   ABDOMINAL HYSTERECTOMY     BREAST BIOPSY Right 10/04/2020   BREAST LUMPECTOMY Right 01/09/2021   Procedure: incision drainage right breast abscess;  Surgeon: [name], MD;  Location: [facility];  Service: General;  Laterality: Right;   BREAST LUMPECTOMY WITH RADIOACTIVE SEED AND SENTINEL LYMPH NODE BIOPSY Right 11/05/2020   Procedure: RIGHT BREAST LUMPECTOMY WITH RADIOACTIVE SEED AND  RIGHT AXILLARY SENTINEL LYMPH NODE BIOPSY;  Surgeon: [name], MD;  Location: [facility];  Service: General;  Laterality: Right;   CHOLECYSTECTOMY     IR REMOVAL TUN ACCESS W/ PORT W/O FL MOD SED  08/13/2021   KNEE SURGERY     PORTACATH PLACEMENT Right 12/30/2020   Procedure: INSERTION PORT-A-CATH;  Surgeon: [name], MD;  Location: [facility] OR;  Service: General;  Laterality: Right;    Family History  Problem Relation Age of Onset   Heart attack Mother    Diabetes Father    Colon cancer Maternal Grandmother    Breast cancer Cousin        dx early 23's    Social History   Socioeconomic History   Marital status: Married    Spouse name: Not on file   Number of children: Not on file   Years of education: Not on file   Highest education level: Not on file  Occupational History   Not on file  Tobacco Use   Smoking status: Former    Current packs/day: 0.00    Average packs/day: 0.5 packs/day for 10.0 years (5.0 ttl pk-yrs)    Types: Cigarettes    Start date: 09/26/1971    Quit date: 09/25/1981    Years since quitting: 41.6   Smokeless tobacco: Never  Vaping Use   Vaping status: Never Used  Substance and Sexual Activity   Alcohol use: Never   Drug use: Never   Sexual activity: Yes    Birth control/protection: Surgical  Other Topics Concern   Not on file  Social History Narrative   Not on file   Social  Drivers of Health   Financial Resource Strain: Medium Risk (10/16/2020)   Overall Financial Resource Strain (CARDIA)    Difficulty of Paying Living Expenses: Somewhat hard  Food Insecurity: No Food Insecurity (10/16/2020)   Hunger Vital Sign    Worried About Running Out of Food in the Last Year: Never true    Ran Out of Food in the Last Year: Never true  Transportation Needs: No Transportation Needs (10/16/2020)   PRAPARE - Administrator, Civil Service (Medical): No    Lack of Transportation (Non-Medical): No  Physical Activity: Not  on file  Stress: Not on file  Social Connections: Not on file    No current facility-administered medications on file prior to encounter.   Current Outpatient Medications on File Prior to Encounter  Medication Sig Dispense Refill   ACCU-CHEK GUIDE test strip      acetaminophen (TYLENOL) 500 MG tablet Take 1,000 mg by mouth every 6 (six) hours as needed for moderate pain.     amitriptyline (ELAVIL) 75 MG tablet Take 75 mg by mouth at bedtime.     anastrozole (ARIMIDEX) 1 MG tablet TAKE 1 TABLET EVERY DAY 90 tablet 3   Blood Glucose Monitoring Suppl (ACCU-CHEK GUIDE ME) w/Device KIT      ibuprofen (ADVIL) 200 MG tablet Take 400 mg by mouth every 6 (six) hours as needed for moderate pain or headache.     Insulin Syringe-Needle U-100 (INSULIN SYRINGE .5CC/30GX5/16") 30G X 5/16" 0.5 ML MISC See admin instructions.     losartan (COZAAR) 25 MG tablet Take 25 mg by mouth daily.     Multiple Vitamin (MULTIVITAMIN WITH MINERALS) TABS tablet Take 1 tablet by mouth daily.     NOVOLOG FLEXPEN 100 UNIT/ML FlexPen Inject 0-5 Units into the skin See admin instructions. Per sliding scale     pantoprazole (PROTONIX) 40 MG tablet Take 40 mg by mouth daily.     PREVIDENT 5000 DRY MOUTH 1.1 % GEL dental gel Place 1 Application onto teeth 2 (two) times daily.     sertraline (ZOLOFT) 100 MG tablet Take 100 mg by mouth daily.     sertraline (ZOLOFT) 50 MG tablet Take 50 mg by mouth daily.     simvastatin (ZOCOR) 40 MG tablet Take 40 mg by mouth at bedtime.     tirzepatide [facility]) 5 MG/0.5ML Pen Inject 5 mg into the skin every 7 (seven) days. 2 mL 5   TOUJEO SOLOSTAR 300 UNIT/ML Solostar Pen Inject 45 Units into the skin daily.      Review of Systems: a complete, 10pt review of systems was completed with pertinent positives and negatives as documented in the HPI  Physical Exam: Vitals:   05/15/23 0608 05/15/23 0615  BP: (!) 136/92   Pulse: (!) 101   Resp: 16   Temp:  98 F (36.7 C)  SpO2: 91%     Gen: A&Ox3, no distress  HEENT: Extraocular motions intact, pupils equally round and reactive.  Moist mucous membranes Chest: respiratory effort is normal.  Cardiovascular: RRR with palpable distal pulses, no pedal edema Gastrointestinal: soft, mildly distended, minimally diffusely tender without peritoneal signs.  NG tube in place, currently clamped but there is about 600 cc of thick, slightly bilious fluid in the canister Muscoloskeletal: no clubbing or cyanosis of the fingers.  Strength is symmetrical throughout.  Range of motion of bilateral upper and lower extremities normal without pain, crepitation or contracture. Neuro: cranial nerves grossly intact.  Sensation intact to light  touch diffusely. Psych: appropriate mood and affect, normal insight/judgment intact  Skin: warm and dry      Latest Ref Rng & Units 05/15/2023    1:11 AM 05/14/2023    9:01 PM 02/02/2022    8:48 AM  CBC  WBC 4.0 - 10.5 K/uL 12.2  13.5  5.4   Hemoglobin 12.0 - 15.0 g/dL 16.1  09.6  04.5   Hematocrit 36.0 - 46.0 % 44.0  46.5  37.8   Platelets 150 - 400 K/uL 271  322  219        Latest Ref Rng & Units 05/15/2023    2:37 AM 05/15/2023    1:11 AM 05/14/2023    9:01 PM  CMP  Glucose 70 - 99 mg/dL 409   811   BUN 8 - 23 mg/dL 18   17   Creatinine 9.14 - 1.00 mg/dL 7.82  9.56  2.13   Sodium 135 - 145 mmol/L 138   136   Potassium 3.5 - 5.1 mmol/L 4.2   3.9   Chloride 98 - 111 mmol/L 102   101   CO2 22 - 32 mmol/L 24   21   Calcium 8.9 - 10.3 mg/dL 9.6   08.6   Total Protein 6.5 - 8.1 g/dL   8.6   Total Bilirubin 0.0 - 1.2 mg/dL   0.9   Alkaline Phos 38 - 126 U/L   104   AST 15 - 41 U/L   33   ALT 0 - 44 U/L   35     No results found for: "INR", "PROTIME"  Imaging: DG Abd Portable 1V-Small Bowel Protocol-Position Verification Result Date: 05/15/2023 CLINICAL DATA:  Nasogastric tube placement EXAM: PORTABLE ABDOMEN - 1 VIEW COMPARISON:  None Available. FINDINGS: Tip and side port of the nasogastric  tube project within the stomach. IMPRESSION: Tip and side port of the nasogastric tube in the stomach. Electronically Signed   By: Deatra Robinson M.D.   On: 05/15/2023 02:15   CT ABDOMEN PELVIS W CONTRAST Result Date: 05/14/2023 CLINICAL DATA:  Generalized abdominal pain, vomiting. EXAM: CT ABDOMEN AND PELVIS WITH CONTRAST TECHNIQUE: Multidetector CT imaging of the abdomen and pelvis was performed using the standard protocol following bolus administration of intravenous contrast. RADIATION DOSE REDUCTION: This exam was performed according to the departmental dose-optimization program which includes automated exposure control, adjustment of the mA and/or kV according to patient size and/or use of iterative reconstruction technique. CONTRAST:  OMNIPAQUE IOHEXOL 300 MG/ML  SOLN COMPARISON:  01/06/2021 FINDINGS: Lower chest: Dependent atelectasis in the lower lobes. Coronary artery and aortic atherosclerosis. Hepatobiliary: Prior cholecystectomy. Diffuse fatty infiltration of the liver. No focal hepatic abnormality. Pancreas: No focal abnormality or ductal dilatation. Spleen: No focal abnormality.  Normal size. Adrenals/Urinary Tract: No adrenal abnormality. No focal renal abnormality. No stones or hydronephrosis. Urinary bladder is unremarkable. Stomach/Bowel: Normal appendix. With scattered left colonic diverticulosis. No active diverticulitis. Dilated fluid-filled small bowel loops into the pelvis. Distal small bowel is decompressed. Findings concerning for small bowel obstruction. Transition appears to be in the midline of the lower pelvis, presumably adhesion related. Stomach is moderately distended with fluid. Vascular/Lymphatic: No evidence of aneurysm or adenopathy. Scattered aortic calcifications. Reproductive: Prior hysterectomy.  No adnexal masses. Other: Trace free fluid in the cul-de-sac.  No free air. Musculoskeletal: No acute bony abnormality IMPRESSION: Distended, fluid-filled stomach and small  bowel into the pelvis. Distal small bowel is decompressed. Findings most compatible with distal small bowel obstruction with  transition in the midline of the lower pelvis. Scattered left colonic diverticulosis. No active diverticulitis. Hepatic steatosis. Aortic atherosclerosis. Electronically Signed   By: Charlett Nose M.D.   On: 05/14/2023 23:33     A/P: 68 year old woman with UTI and partial small bowel obstruction.  Agree with stating plans for fluid resuscitation, maintaining electrolytes, NG decompression and SBO protocol which has already been administered; Gastrografin was given around 6:00 this morning.  Surgery team will follow.    Patient Active Problem List   Diagnosis Date Noted   SBO (small bowel obstruction) (HCC) 05/15/2023   Port-A-Cath in place 01/23/2021   Uncontrolled type 2 diabetes mellitus with hyperglycemia (HCC) 01/07/2021   SIRS (systemic inflammatory response syndrome) (HCC) 01/06/2021   Malignant neoplasm of upper-inner quadrant of right breast in female, estrogen receptor positive (HCC) 10/14/2020       Phylliss Blakes, MD [facility]  See AMION to contact appropriate on-call provider

## 2023-05-15 NOTE — ED Notes (Addendum)
 Low intermittent suction for NG tube initiated at 0230

## 2023-05-15 NOTE — ED Notes (Signed)
 Pt NG tube connected back to intermittent suction

## 2023-05-15 NOTE — Progress Notes (Signed)
 PROGRESS NOTE    [name]  ZOX:[id number] DOB: May 12, 1955 DOA: 05/14/2023 PCP: Tally Joe, MD   Brief Narrative:  [name] is a 68 y.o. female with medical history significant for obesity, type 2 diabetes on Mounjaro, diverticulosis, who presents with sudden onset abdominal pain.  CT abdomen pelvis concerning for obstructive distal small bowel obstruction.  Hospitalist called for admission and general surgery called in consult.  Assessment & Plan:   Principal Problem:   SBO (small bowel obstruction) (HCC)   SBO, suspect secondary to adhesions Complicated by [facility] Multiple prior abdominal surgeries General Surgery following, current plan for medical management Continue IV fluids until p.o. intake is more appropriate N.p.o. with ice chips, NG tube to intermittent suction, management per surgery Educated to increase ambulation as tolerated   Sepsis secondary to UTI, POA WBC 13.5, tachycardia noted at intake Symptoms complicated by small bowel obstruction and abdominal pain/lower pelvic pain Will treat x 3 days with ceftriaxone empirically to cover   Type 2 diabetes with hyperglycemia, uncontrolled Hemoglobin 7.6, down from 8.7 previously but still elevated Continues on Mounjaro, discussed discontinuation given above obstruction   Obesity BMI 30 Recommend weight loss outpatient with regular physical activity and healthy dieting. Would discontinue Mounjaro at discharge, follow-up with PCP for further evaluation and treatment  DVT prophylaxis: enoxaparin (LOVENOX) injection 40 mg Start: 05/15/23 1000   Code Status:   Code Status: Full Code  Family Communication: None present  Status is: Inpatient  Dispo: The patient is from: Home              Anticipated d/c is to: Home              Anticipated d/c date is: 24 to 48 hours              Patient currently not medically stable for discharge  Consultants:  General surgery  Procedures:  None  planned  Antimicrobials:  Ceftriaxone x 3 days  Subjective: No acute issues or events overnight, abdominal pain and distention ongoing but improving from prior.  Not yet back to baseline.  Otherwise denies nausea vomiting diarrhea headache fevers chills or chest pain.  Objective: Vitals:   05/15/23 0415 05/15/23 0608 05/15/23 0615 05/15/23 0730  BP: 139/66 (!) 136/92  135/73  Pulse: (!) 101 (!) 101  (!) 102  Resp: 16 16  16   Temp:   98 F (36.7 C)   TempSrc:   Oral   SpO2: 92% 91%  93%  Weight:      Height:       No intake or output data in the 24 hours ending 05/15/23 0740 Filed Weights   05/14/23 2038  Weight: 89.4 kg    Examination:  General:  Pleasantly resting in bed, No acute distress. HEENT: Tolerating NG tube intermittent suction with bilious fluid Neck:  Without mass or deformity.  Trachea is midline. Lungs:  Clear to auscultate bilaterally without rhonchi, wheeze, or rales. Heart:  Regular rate and rhythm.  Without murmurs, rubs, or gallops. Abdomen: Obese, soft, moderately tender diffusely without PMI rebound or guarding Extremities: Without cyanosis, clubbing, edema, or obvious deformity.  Data Reviewed: I have personally reviewed following labs and imaging studies  CBC: Recent Labs  Lab 05/14/23 2101 05/15/23 0111  WBC 13.5* 12.2*  HGB 15.9* 14.6  HCT 46.5* 44.0  MCV 90.6 92.6  PLT 322 271   Basic Metabolic Panel: Recent Labs  Lab 05/14/23 2101 05/15/23 0111 05/15/23 0237  NA 136  --  138  K 3.9  --  4.2  CL 101  --  102  CO2 21*  --  24  GLUCOSE 212*  --  145*  BUN 17  --  18  CREATININE 0.84 0.78 0.82  CALCIUM 10.2  --  9.6  MG  --   --  2.3  PHOS  --   --  5.1*   GFR: Estimated Creatinine Clearance: 76.4 mL/min (by C-G formula based on SCr of 0.82 mg/dL). Liver Function Tests: Recent Labs  Lab 05/14/23 2101  AST 33  ALT 35  ALKPHOS 104  BILITOT 0.9  PROT 8.6*  ALBUMIN 4.6   Recent Labs  Lab 05/14/23 2101  LIPASE 22    HbA1C: Recent Labs    05/15/23 0111  HGBA1C 7.6*   CBG: Recent Labs  Lab 05/15/23 0142 05/15/23 0352  GLUCAP 115* 156*    No results found for this or any previous visit (from the past 240 hours).   Radiology Studies: DG Abd Portable 1V-Small Bowel Protocol-Position Verification Result Date: 05/15/2023 CLINICAL DATA:  Nasogastric tube placement EXAM: PORTABLE ABDOMEN - 1 VIEW COMPARISON:  None Available. FINDINGS: Tip and side port of the nasogastric tube project within the stomach. IMPRESSION: Tip and side port of the nasogastric tube in the stomach. Electronically Signed   By: Deatra Robinson M.D.   On: 05/15/2023 02:15   CT ABDOMEN PELVIS W CONTRAST Result Date: 05/14/2023 CLINICAL DATA:  Generalized abdominal pain, vomiting. EXAM: CT ABDOMEN AND PELVIS WITH CONTRAST TECHNIQUE: Multidetector CT imaging of the abdomen and pelvis was performed using the standard protocol following bolus administration of intravenous contrast. RADIATION DOSE REDUCTION: This exam was performed according to the departmental dose-optimization program which includes automated exposure control, adjustment of the mA and/or kV according to patient size and/or use of iterative reconstruction technique. CONTRAST:  OMNIPAQUE IOHEXOL 300 MG/ML  SOLN COMPARISON:  01/06/2021 FINDINGS: Lower chest: Dependent atelectasis in the lower lobes. Coronary artery and aortic atherosclerosis. Hepatobiliary: Prior cholecystectomy. Diffuse fatty infiltration of the liver. No focal hepatic abnormality. Pancreas: No focal abnormality or ductal dilatation. Spleen: No focal abnormality.  Normal size. Adrenals/Urinary Tract: No adrenal abnormality. No focal renal abnormality. No stones or hydronephrosis. Urinary bladder is unremarkable. Stomach/Bowel: Normal appendix. With scattered left colonic diverticulosis. No active diverticulitis. Dilated fluid-filled small bowel loops into the pelvis. Distal small bowel is decompressed.  Findings concerning for small bowel obstruction. Transition appears to be in the midline of the lower pelvis, presumably adhesion related. Stomach is moderately distended with fluid. Vascular/Lymphatic: No evidence of aneurysm or adenopathy. Scattered aortic calcifications. Reproductive: Prior hysterectomy.  No adnexal masses. Other: Trace free fluid in the cul-de-sac.  No free air. Musculoskeletal: No acute bony abnormality IMPRESSION: Distended, fluid-filled stomach and small bowel into the pelvis. Distal small bowel is decompressed. Findings most compatible with distal small bowel obstruction with transition in the midline of the lower pelvis. Scattered left colonic diverticulosis. No active diverticulitis. Hepatic steatosis. Aortic atherosclerosis. Electronically Signed   By: Charlett Nose M.D.   On: 05/14/2023 23:33   Scheduled Meds:  enoxaparin (LOVENOX) injection  40 mg Subcutaneous Q24H   insulin aspart  0-9 Units Subcutaneous Q4H   Continuous Infusions:  cefTRIAXone (ROCEPHIN)  IV     lactated ringers 100 mL/hr at 05/15/23 0146     LOS: 0 days   Time spent:  Azucena Fallen, DO [facility]  If 7PM-7AM, please contact night-coverage [email or web address]  05/15/2023, 7:40 AM

## 2023-05-15 NOTE — ED Notes (Signed)
 Lab was called to follow up on urine culture, Urine sample is waiting to get transport to [facility] to get process.

## 2023-05-16 ENCOUNTER — Inpatient Hospital Stay (HOSPITAL_COMMUNITY): Payer: Medicare HMO

## 2023-05-16 DIAGNOSIS — K56609 Unspecified intestinal obstruction, unspecified as to partial versus complete obstruction: Secondary | ICD-10-CM | POA: Diagnosis not present

## 2023-05-16 LAB — BASIC METABOLIC PANEL
Anion gap: 9 (ref 5–15)
BUN: 26 mg/dL — ABNORMAL HIGH (ref 8–23)
CO2: 32 mmol/L (ref 22–32)
Calcium: 9.2 mg/dL (ref 8.9–10.3)
Chloride: 100 mmol/L (ref 98–111)
Creatinine, Ser: 0.84 mg/dL (ref 0.44–1.00)
GFR, Estimated: >60 mL/min (ref >60)
Glucose, Bld: 116 mg/dL — ABNORMAL HIGH (ref 70–99)
Potassium: 4.4 mmol/L (ref 3.5–5.1)
Sodium: 141 mmol/L (ref 135–145)

## 2023-05-16 LAB — GLUCOSE, CAPILLARY
Glucose-Capillary: 108 mg/dL — ABNORMAL HIGH (ref 70–99)
Glucose-Capillary: 108 mg/dL — ABNORMAL HIGH (ref 70–99)
Glucose-Capillary: 115 mg/dL — ABNORMAL HIGH (ref 70–99)
Glucose-Capillary: 117 mg/dL — ABNORMAL HIGH (ref 70–99)
Glucose-Capillary: 122 mg/dL — ABNORMAL HIGH (ref 70–99)
Glucose-Capillary: 94 mg/dL (ref 70–99)
Glucose-Capillary: 98 mg/dL (ref 70–99)

## 2023-05-16 LAB — CBC
HCT: 41.6 % (ref 36.0–46.0)
Hemoglobin: 13.1 g/dL (ref 12.0–15.0)
MCH: 30.1 pg (ref 26.0–34.0)
MCHC: 31.5 g/dL (ref 30.0–36.0)
MCV: 95.6 fL (ref 80.0–100.0)
Platelets: 225 10*3/uL (ref 150–400)
RBC: 4.35 MIL/uL (ref 3.87–5.11)
RDW: 14 % (ref 11.5–15.5)
WBC: 8.6 10*3/uL (ref 4.0–10.5)
nRBC: 0 % (ref 0.0–0.2)

## 2023-05-16 LAB — URINE CULTURE

## 2023-05-16 NOTE — Plan of Care (Signed)
  Problem: Education: Goal: Ability to describe self-care measures that may prevent or decrease complications (Diabetes Survival Skills Education) will improve Outcome: Progressing   Problem: Coping: Goal: Ability to adjust to condition or change in health will improve Outcome: Progressing   Problem: Fluid Volume: Goal: Ability to maintain a balanced intake and output will improve Outcome: Progressing   Problem: Health Behavior/Discharge Planning: Goal: Ability to manage health-related needs will improve Outcome: Progressing   Problem: Health Behavior/Discharge Planning: Goal: Ability to identify and utilize available resources and services will improve Outcome: Progressing   Problem: Nutritional: Goal: Maintenance of adequate nutrition will improve Outcome: Progressing   Problem: Nutritional: Goal: Maintenance of adequate nutrition will improve Outcome: Progressing   Problem: Nutritional: Goal: Progress toward achieving an optimal weight will improve Outcome: Progressing   Problem: Tissue Perfusion: Goal: Adequacy of tissue perfusion will improve Outcome: Progressing

## 2023-05-16 NOTE — Progress Notes (Signed)
 Mobility Specialist - Progress Note   05/16/23 0927  Mobility  Activity Ambulated with assistance in hallway  Level of Assistance Modified independent, requires aide device or extra time  Assistive Device Front wheel walker  Distance Ambulated (ft) 120 ft  Activity Response Tolerated well  Mobility Referral Yes  Mobility visit 1 Mobility  Mobility Specialist Start Time (ACUTE ONLY) 0912  Mobility Specialist Stop Time (ACUTE ONLY) 0925  Mobility Specialist Time Calculation (min) (ACUTE ONLY) 13 min   Pt received in bed and agreeable to mobility. No complaints during session. Pt to bed after session with all needs met.    [facility]

## 2023-05-16 NOTE — Plan of Care (Signed)

## 2023-05-16 NOTE — Progress Notes (Signed)
 PROGRESS NOTE    [name]  KGM:[id number] DOB: September 23, 1955 DOA: 05/14/2023 PCP: Tally Joe, MD   Brief Narrative:  [name] is a 68 y.o. female with medical history significant for obesity, type 2 diabetes on Mounjaro, diverticulosis, who presents with sudden onset abdominal pain.  CT abdomen pelvis concerning for obstructive distal small bowel obstruction.  Hospitalist called for admission and general surgery called in consult.  Assessment & Plan:   Principal Problem:   SBO (small bowel obstruction) (HCC)  SBO, suspect secondary to adhesions Complicated by [facility] Multiple prior abdominal surgeries General Surgery following, current plan for medical management Continue IV fluids until p.o. intake is more appropriate N.p.o. with ice chips, NG tube to intermittent suction, management per surgery Educated to increase ambulation as tolerated   Sepsis secondary to UTI, POA WBC 13.5, tachycardia noted at intake Symptoms complicated by small bowel obstruction and abdominal pain/lower pelvic pain Will treat x 3 days with ceftriaxone empirically to cover   Type 2 diabetes with hyperglycemia, uncontrolled Hemoglobin 7.6, down from 8.7 previously but still elevated Continues on Mounjaro, discussed discontinuation given above obstruction   Obesity BMI 30 Recommend weight loss outpatient with regular physical activity and healthy dieting. Would discontinue Mounjaro at discharge, follow-up with PCP for further evaluation and treatment  DVT prophylaxis: enoxaparin (LOVENOX) injection 40 mg Start: 05/15/23 1000   Code Status:   Code Status: Full Code  Family Communication: None present  Status is: Inpatient  Dispo: The patient is from: Home              Anticipated d/c is to: Home              Anticipated d/c date is: 24 to 48 hours              Patient currently not medically stable for discharge  Consultants:  General surgery  Procedures:  None  planned  Antimicrobials:  Ceftriaxone x 3 days  Subjective: No acute issues or events overnight, abdominal pain and distention markedly improved, still no BM or flatus  Objective: Vitals:   05/15/23 1307 05/15/23 1744 05/15/23 2030 05/16/23 0500  BP: 130/79 (!) 117/50 (!) 151/74 (!) 157/75  Pulse: 95 97 95 95  Resp: 16 16 20 20   Temp: 97.9 F (36.6 C) 98.7 F (37.1 C) 99.1 F (37.3 C) 98.7 F (37.1 C)  TempSrc: Oral Oral Oral Oral  SpO2: 92% 90% (!) 88% 96%  Weight:      Height:        Intake/Output Summary (Last 24 hours) at 05/16/2023 0804 Last data filed at 05/15/2023 2300 Gross per 24 hour  Intake 2000.68 ml  Output 1025 ml  Net 975.68 ml   Filed Weights   05/14/23 2038 05/15/23 0913  Weight: 89.4 kg 95.2 kg    Examination:  General:  Pleasantly resting in bed, No acute distress. HEENT: Tolerating NG tube intermittent suction with bilious fluid Neck:  Without mass or deformity.  Trachea is midline. Lungs:  Clear to auscultate bilaterally without rhonchi, wheeze, or rales. Heart:  Regular rate and rhythm.  Without murmurs, rubs, or gallops. Abdomen: Obese, soft, moderately tender diffusely without PMI rebound or guarding Extremities: Without cyanosis, clubbing, edema, or obvious deformity.  Data Reviewed: I have personally reviewed following labs and imaging studies  CBC: Recent Labs  Lab 05/14/23 2101 05/15/23 0111 05/16/23 0640  WBC 13.5* 12.2* 8.6  HGB 15.9* 14.6 13.1  HCT 46.5* 44.0 41.6  MCV 90.6 92.6  95.6  PLT 322 271 225   Basic Metabolic Panel: Recent Labs  Lab 05/14/23 2101 05/15/23 0111 05/15/23 0237 05/16/23 0640  NA 136  --  138 141  K 3.9  --  4.2 4.4  CL 101  --  102 100  CO2 21*  --  24 32  GLUCOSE 212*  --  145* 116*  BUN 17  --  18 26*  CREATININE 0.84 0.78 0.82 0.84  CALCIUM 10.2  --  9.6 9.2  MG  --   --  2.3  --   PHOS  --   --  5.1*  --    GFR: Estimated Creatinine Clearance: 76.9 mL/min (by C-G formula based on SCr  of 0.84 mg/dL).  Liver Function Tests: Recent Labs  Lab 05/14/23 2101  AST 33  ALT 35  ALKPHOS 104  BILITOT 0.9  PROT 8.6*  ALBUMIN 4.6    Recent Labs  Lab 05/14/23 2101  LIPASE 22   HbA1C: Recent Labs    05/15/23 0111  HGBA1C 7.6*   CBG: Recent Labs  Lab 05/15/23 1846 05/15/23 2021 05/16/23 0000 05/16/23 0422 05/16/23 0738  GLUCAP 101* 99 115* 122* 94   Recent Results (from the past 240 hours)  Blood culture (routine x 2)     Status: None (Preliminary result)   Collection Time: 05/14/23 12:00 AM   Specimen: BLOOD  Result Value Ref Range Status   Specimen Description   Final    BLOOD RIGHT ANTECUBITAL Performed at [facility], [address] [place], [place] [address]    Special Requests   Final    BOTTLES DRAWN AEROBIC AND ANAEROBIC Blood Culture adequate volume Performed at [facility], [address] [place], [place] [address]    Culture   Final    NO GROWTH < 12 HOURS Performed at [facility], [address], [place], [place] [address]    Report Status PENDING  Incomplete  Blood culture (routine x 2)     Status: None (Preliminary result)   Collection Time: 05/14/23 11:58 PM   Specimen: BLOOD  Result Value Ref Range Status   Specimen Description   Final    BLOOD LEFT ANTECUBITAL Performed at [facility], [address], [place], [place] [address]    Special Requests   Final    BOTTLES DRAWN AEROBIC AND ANAEROBIC Blood Culture results may not be optimal due to an inadequate volume of blood received in culture bottles Performed at [facility], [address] [place], [place] [address]    Culture   Final    NO GROWTH < 12 HOURS Performed at [facility], [address] [place], [place] [address]    Report Status PENDING  Incomplete  Urine Culture     Status: None (Preliminary result)   Collection Time: 05/15/23  1:41 PM   Specimen: Urine, Random   Result Value Ref Range Status   Specimen Description   Final    URINE, RANDOM Performed at [facility], [address] [place], [place] [address]    Special Requests   Final    URINE, CLEAN CATCH Performed at [facility], [address], [place], [place] [address]    Culture PENDING  Incomplete   Report Status PENDING  Incomplete     Radiology Studies: DG Abd Portable 1V-Small Bowel Obstruction Protocol-initial, 8 hr delay Result Date: 05/15/2023 CLINICAL DATA:  Small-bowel obstruction  EXAM: PORTABLE ABDOMEN - 1 VIEW COMPARISON:  05/15/2023 FINDINGS: NG tube is position within the stomach. There is enteric contrast within the gastric lumen. No contrast within the small bowel. Bowel gas pattern is nonobstructive. Excreted contrast is present within the urinary bladder. Cholecystectomy clips. IMPRESSION: 1. Nonobstructive bowel gas pattern. Enteric contrast is present within the stomach. 2. NG tube within the stomach. Electronically Signed   By: Duanne Guess D.O.   On: 05/15/2023 14:40   DG Abd Portable 1V-Small Bowel Protocol-Position Verification Result Date: 05/15/2023 CLINICAL DATA:  Nasogastric tube placement EXAM: PORTABLE ABDOMEN - 1 VIEW COMPARISON:  None Available. FINDINGS: Tip and side port of the nasogastric tube project within the stomach. IMPRESSION: Tip and side port of the nasogastric tube in the stomach. Electronically Signed   By: Deatra Robinson M.D.   On: 05/15/2023 02:15   CT ABDOMEN PELVIS W CONTRAST Result Date: 05/14/2023 CLINICAL DATA:  Generalized abdominal pain, vomiting. EXAM: CT ABDOMEN AND PELVIS WITH CONTRAST TECHNIQUE: Multidetector CT imaging of the abdomen and pelvis was performed using the standard protocol following bolus administration of intravenous contrast. RADIATION DOSE REDUCTION: This exam was performed according to the departmental dose-optimization program which includes automated exposure control, adjustment of the mA  and/or kV according to patient size and/or use of iterative reconstruction technique. CONTRAST:  OMNIPAQUE IOHEXOL 300 MG/ML  SOLN COMPARISON:  01/06/2021 FINDINGS: Lower chest: Dependent atelectasis in the lower lobes. Coronary artery and aortic atherosclerosis. Hepatobiliary: Prior cholecystectomy. Diffuse fatty infiltration of the liver. No focal hepatic abnormality. Pancreas: No focal abnormality or ductal dilatation. Spleen: No focal abnormality.  Normal size. Adrenals/Urinary Tract: No adrenal abnormality. No focal renal abnormality. No stones or hydronephrosis. Urinary bladder is unremarkable. Stomach/Bowel: Normal appendix. With scattered left colonic diverticulosis. No active diverticulitis. Dilated fluid-filled small bowel loops into the pelvis. Distal small bowel is decompressed. Findings concerning for small bowel obstruction. Transition appears to be in the midline of the lower pelvis, presumably adhesion related. Stomach is moderately distended with fluid. Vascular/Lymphatic: No evidence of aneurysm or adenopathy. Scattered aortic calcifications. Reproductive: Prior hysterectomy.  No adnexal masses. Other: Trace free fluid in the cul-de-sac.  No free air. Musculoskeletal: No acute bony abnormality IMPRESSION: Distended, fluid-filled stomach and small bowel into the pelvis. Distal small bowel is decompressed. Findings most compatible with distal small bowel obstruction with transition in the midline of the lower pelvis. Scattered left colonic diverticulosis. No active diverticulitis. Hepatic steatosis. Aortic atherosclerosis. Electronically Signed   By: Charlett Nose M.D.   On: 05/14/2023 23:33   Scheduled Meds:  enoxaparin (LOVENOX) injection  40 mg Subcutaneous Q24H   insulin aspart  0-9 Units Subcutaneous Q4H   pneumococcal 20-valent conjugate vaccine  0.5 mL Intramuscular Tomorrow-1000   Continuous Infusions:  cefTRIAXone (ROCEPHIN)  IV 1 g (05/15/23 2246)   lactated ringers 100 mL/hr  at 05/16/23 0237     LOS: 1 day   Time spent:  Azucena Fallen, DO [facility]  If 7PM-7AM, please contact night-coverage [email or web address]  05/16/2023, 8:04 AM

## 2023-05-16 NOTE — Progress Notes (Signed)
 Pt NG tube is patent and has green, tan, and brown thick output. 400 ml. Pt nausea improved with PRN compazine and pain relieved with IV ketorolac. Pt is voiding but not passing any gas. Last amount voided was 250 ml.

## 2023-05-16 NOTE — Progress Notes (Addendum)
 Subjective/Chief Complaint: No flatus or BM yet.  Still with some crampy pain, but this is improving.     Objective: Vital signs in last 24 hours: Temp:  [97.9 F (36.6 C)-99.1 F (37.3 C)] 98.6 F (37 C) (02/23 0900) Pulse Rate:  [91-97] 91 (02/23 0900) Resp:  [16-20] 20 (02/23 0500) BP: (117-157)/(50-100) 128/100 (02/23 0900) SpO2:  [88 %-96 %] 94 % (02/23 0900) Weight:  [95.2 kg] 95.2 kg (02/22 0913) Last BM Date :  (prior to admission)  Intake/Output from previous day: 02/22 0701 - 02/23 0700 In: 2000.7 [I.V.:2000.7] Out: 1025 [Urine:200; Emesis/NG output:825] Intake/Output this shift: No intake/output data recorded.  Gen:  A&O x 3, NAD Resp:  breathing comfortably Abd:  soft, non tender, moderately distended.   NGT with bilious output. Did not appear to be working at the time of my exam. Flushed with water and sump port flushed with air.  NGT functional after that.    Lab Results:  Recent Labs    05/15/23 0111 05/16/23 0640  WBC 12.2* 8.6  HGB 14.6 13.1  HCT 44.0 41.6  PLT 271 225   BMET Recent Labs    05/15/23 0237 05/16/23 0640  NA 138 141  K 4.2 4.4  CL 102 100  CO2 24 32  GLUCOSE 145* 116*  BUN 18 26*  CREATININE 0.82 0.84  CALCIUM 9.6 9.2   PT/INR No results for input(s): "LABPROT", "INR" in the last 72 hours. ABG No results for input(s): "PHART", "HCO3" in the last 72 hours.  Invalid input(s): "PCO2", "PO2"  Studies/Results: DG Abd Portable 1V-Small Bowel Obstruction Protocol-initial, 8 hr delay Result Date: 05/15/2023 CLINICAL DATA:  Small-bowel obstruction EXAM: PORTABLE ABDOMEN - 1 VIEW COMPARISON:  05/15/2023 FINDINGS: NG tube is position within the stomach. There is enteric contrast within the gastric lumen. No contrast within the small bowel. Bowel gas pattern is nonobstructive. Excreted contrast is present within the urinary bladder. Cholecystectomy clips. IMPRESSION: 1. Nonobstructive bowel gas pattern. Enteric contrast is present  within the stomach. 2. NG tube within the stomach. Electronically Signed   By: Duanne Guess D.O.   On: 05/15/2023 14:40   DG Abd Portable 1V-Small Bowel Protocol-Position Verification Result Date: 05/15/2023 CLINICAL DATA:  Nasogastric tube placement EXAM: PORTABLE ABDOMEN - 1 VIEW COMPARISON:  None Available. FINDINGS: Tip and side port of the nasogastric tube project within the stomach. IMPRESSION: Tip and side port of the nasogastric tube in the stomach. Electronically Signed   By: Deatra Robinson M.D.   On: 05/15/2023 02:15   CT ABDOMEN PELVIS W CONTRAST Result Date: 05/14/2023 CLINICAL DATA:  Generalized abdominal pain, vomiting. EXAM: CT ABDOMEN AND PELVIS WITH CONTRAST TECHNIQUE: Multidetector CT imaging of the abdomen and pelvis was performed using the standard protocol following bolus administration of intravenous contrast. RADIATION DOSE REDUCTION: This exam was performed according to the departmental dose-optimization program which includes automated exposure control, adjustment of the mA and/or kV according to patient size and/or use of iterative reconstruction technique. CONTRAST:  OMNIPAQUE IOHEXOL 300 MG/ML  SOLN COMPARISON:  01/06/2021 FINDINGS: Lower chest: Dependent atelectasis in the lower lobes. Coronary artery and aortic atherosclerosis. Hepatobiliary: Prior cholecystectomy. Diffuse fatty infiltration of the liver. No focal hepatic abnormality. Pancreas: No focal abnormality or ductal dilatation. Spleen: No focal abnormality.  Normal size. Adrenals/Urinary Tract: No adrenal abnormality. No focal renal abnormality. No stones or hydronephrosis. Urinary bladder is unremarkable. Stomach/Bowel: Normal appendix. With scattered left colonic diverticulosis. No active diverticulitis. Dilated fluid-filled small bowel loops into  the pelvis. Distal small bowel is decompressed. Findings concerning for small bowel obstruction. Transition appears to be in the midline of the lower pelvis,  presumably adhesion related. Stomach is moderately distended with fluid. Vascular/Lymphatic: No evidence of aneurysm or adenopathy. Scattered aortic calcifications. Reproductive: Prior hysterectomy.  No adnexal masses. Other: Trace free fluid in the cul-de-sac.  No free air. Musculoskeletal: No acute bony abnormality IMPRESSION: Distended, fluid-filled stomach and small bowel into the pelvis. Distal small bowel is decompressed. Findings most compatible with distal small bowel obstruction with transition in the midline of the lower pelvis. Scattered left colonic diverticulosis. No active diverticulitis. Hepatic steatosis. Aortic atherosclerosis. Electronically Signed   By: Charlett Nose M.D.   On: 05/14/2023 23:33    Anti-infectives: Anti-infectives (From admission, onward)    Start     Dose/Rate Route Frequency Ordered Stop   05/15/23 2200  cefTRIAXone (ROCEPHIN) 1 g in sodium chloride 0.9 % 100 mL IVPB        1 g 200 mL/hr over 30 Minutes Intravenous Every 24 hours 05/15/23 0628     05/14/23 2345  cefTRIAXone (ROCEPHIN) 1 g in sodium chloride 0.9 % 100 mL IVPB        1 g 200 mL/hr over 30 Minutes Intravenous  Once 05/14/23 2344 05/15/23 0102       Assessment/Plan: SBO in setting of prior TAH and lap chole UTI Uncontrolled DM HD 2  NPO, NGT IV fluids SBO protocol. Initial post gastrograffin films show contrast in stomach.  Repeat plain films today.  If contrast in colon, ok to pull NGT and advance diet.   VTE ppx with lovenox    Other PMH of significance.   H/o diverticulosis    LOS: 1 day    Almond Lint 05/16/2023

## 2023-05-17 ENCOUNTER — Inpatient Hospital Stay (HOSPITAL_COMMUNITY): Payer: Medicare HMO

## 2023-05-17 DIAGNOSIS — K56609 Unspecified intestinal obstruction, unspecified as to partial versus complete obstruction: Secondary | ICD-10-CM | POA: Diagnosis not present

## 2023-05-17 LAB — BASIC METABOLIC PANEL
Anion gap: 15 (ref 5–15)
BUN: 22 mg/dL (ref 8–23)
CO2: 22 mmol/L (ref 22–32)
Calcium: 9.1 mg/dL (ref 8.9–10.3)
Chloride: 102 mmol/L (ref 98–111)
Creatinine, Ser: 0.53 mg/dL (ref 0.44–1.00)
GFR, Estimated: >60 mL/min (ref >60)
Glucose, Bld: 85 mg/dL (ref 70–99)
Potassium: 3.7 mmol/L (ref 3.5–5.1)
Sodium: 139 mmol/L (ref 135–145)

## 2023-05-17 LAB — GLUCOSE, CAPILLARY
Glucose-Capillary: 103 mg/dL — ABNORMAL HIGH (ref 70–99)
Glucose-Capillary: 95 mg/dL (ref 70–99)
Glucose-Capillary: 85 mg/dL (ref 70–99)
Glucose-Capillary: 95 mg/dL (ref 70–99)
Glucose-Capillary: 92 mg/dL (ref 70–99)
Glucose-Capillary: 91 mg/dL (ref 70–99)

## 2023-05-17 LAB — MAGNESIUM: Magnesium: 2 mg/dL (ref 1.7–2.4)

## 2023-05-17 MED ORDER — LACTATED RINGERS IV SOLN
INTRAVENOUS | Status: AC
  Administered 2023-05-17 – 2023-05-18 (×2): 100 mL/h via INTRAVENOUS

## 2023-05-17 MED ORDER — PHENOL 1.4 % MT LIQD
1.0000 | OROMUCOSAL | Status: DC | PRN
  Filled 2023-05-17: qty 177

## 2023-05-17 MED ORDER — HYDRALAZINE HCL 20 MG/ML IJ SOLN
5.0000 mg | Freq: Four times a day (QID) | INTRAMUSCULAR | Status: DC | PRN
  Administered 2023-05-18: 5 mg via INTRAVENOUS
  Filled 2023-05-17: qty 1

## 2023-05-17 MED ORDER — DEXTROSE 10 % IV SOLN: INTRAVENOUS | Status: DC

## 2023-05-17 MED ORDER — DIATRIZOATE MEGLUMINE & SODIUM 66-10 % PO SOLN
90.0000 mL | Freq: Once | ORAL | Status: AC
  Administered 2023-05-17: 90 mL via NASOGASTRIC
  Filled 2023-05-17: qty 90

## 2023-05-17 NOTE — Progress Notes (Signed)
 Progress Note     Subjective: Pt reports she is still having some abdominal soreness but pain overall better. NGT intermittently not functioning well and feels like stuff is backing up into throat. Belching. No flatus or BM. Ambulating 2x in hall yesterday.   Objective: Vital signs in last 24 hours: Temp:  [98.4 F (36.9 C)-99.4 F (37.4 C)] 98.4 F (36.9 C) (02/24 0406) Pulse Rate:  [96-100] 100 (02/24 0406) Resp:  [18] 18 (02/24 0406) BP: (163-184)/(80-97) 184/97 (02/24 0406) SpO2:  [87 %-93 %] 87 % (02/24 0406) Last BM Date :  (prior to admission)  Intake/Output from previous day: 02/23 0701 - 02/24 0700 In: 1749.4 [I.V.:1649.4; IV Piggyback:100] Out: 1400 [Urine:500; Emesis/NG output:900] Intake/Output this shift: No intake/output data recorded.  PE: General: pleasant, WD, obese female who is laying in bed in NAD Lungs: Respiratory effort nonlabored Abd: soft, NT, mild distention, NGT with thin bilious drainage - flushed NGT and sump port, repositioned slightly Psych: A&Ox3 with an appropriate affect.    Lab Results:  Recent Labs    05/15/23 0111 05/16/23 0640  WBC 12.2* 8.6  HGB 14.6 13.1  HCT 44.0 41.6  PLT 271 225   BMET Recent Labs    05/15/23 0237 05/16/23 0640  NA 138 141  K 4.2 4.4  CL 102 100  CO2 24 32  GLUCOSE 145* 116*  BUN 18 26*  CREATININE 0.82 0.84  CALCIUM 9.6 9.2   PT/INR No results for input(s): "LABPROT", "INR" in the last 72 hours. CMP     Component Value Date/Time   NA 141 05/16/2023 0640   K 4.4 05/16/2023 0640   CL 100 05/16/2023 0640   CO2 32 05/16/2023 0640   GLUCOSE 116 (H) 05/16/2023 0640   BUN 26 (H) 05/16/2023 0640   CREATININE 0.84 05/16/2023 0640   CREATININE 0.73 02/02/2022 0848   CALCIUM 9.2 05/16/2023 0640   PROT 8.6 (H) 05/14/2023 2101   ALBUMIN 4.6 05/14/2023 2101   AST 33 05/14/2023 2101   AST 21 02/02/2022 0848   ALT 35 05/14/2023 2101   ALT 23 02/02/2022 0848   ALKPHOS 104 05/14/2023 2101    BILITOT 0.9 05/14/2023 2101   BILITOT 0.3 02/02/2022 0848   GFRNONAA >60 05/16/2023 0640   GFRNONAA >60 02/02/2022 0848   Lipase     Component Value Date/Time   LIPASE 22 05/14/2023 2101       Studies/Results: DG Abd 2 Views Result Date: 05/16/2023 CLINICAL DATA:  Small bowel obstruction. EXAM: ABDOMEN - 2 VIEW COMPARISON:  May 15, 2023. FINDINGS: Nasogastric tube tip is seen in proximal stomach. Dilated small bowel loops are noted concerning for distal small bowel obstruction or possibly ileus. No colonic dilatation is noted. Residual contrast is noted in nondilated right colon. IMPRESSION: Small bowel dilatation is now noted concerning for distal small bowel obstruction or possibly ileus. Electronically Signed   By: Lupita Raider M.D.   On: 05/16/2023 10:28   DG Abd Portable 1V-Small Bowel Obstruction Protocol-initial, 8 hr delay Result Date: 05/15/2023 CLINICAL DATA:  Small-bowel obstruction EXAM: PORTABLE ABDOMEN - 1 VIEW COMPARISON:  05/15/2023 FINDINGS: NG tube is position within the stomach. There is enteric contrast within the gastric lumen. No contrast within the small bowel. Bowel gas pattern is nonobstructive. Excreted contrast is present within the urinary bladder. Cholecystectomy clips. IMPRESSION: 1. Nonobstructive bowel gas pattern. Enteric contrast is present within the stomach. 2. NG tube within the stomach. Electronically Signed   By: Janyth Pupa  Plundo D.O.   On: 05/15/2023 14:40    Anti-infectives: Anti-infectives (From admission, onward)    Start     Dose/Rate Route Frequency Ordered Stop   05/15/23 2200  cefTRIAXone (ROCEPHIN) 1 g in sodium chloride 0.9 % 100 mL IVPB        1 g 200 mL/hr over 30 Minutes Intravenous Every 24 hours 05/15/23 0628     05/14/23 2345  cefTRIAXone (ROCEPHIN) 1 g in sodium chloride 0.9 % 100 mL IVPB        1 g 200 mL/hr over 30 Minutes Intravenous  Once 05/14/23 2344 05/15/23 0102        Assessment/Plan  SBO - hx of TAH and  lap chole - NGT has intermittently not been functioning well - adjusted position this AM and appears to be working better  - No bowel function, KUB stable to maybe slightly improved on my eval, no peritonitis  - no indication for emergent surgical intervention  - Check BMET and Mg - repeat SBO protocol today, if not improved tomorrow may need repeat CT vs OR  FEN: NPO, NGT to LIWS, LR@100cc /h VTE: LMWH ID: [email or web address] for UTI   - per TRH -  UTI Uncontrolled T2DM HTN HLD Hx of breast cancer s/p lumpectomy and chemo/rad  LOS: 2 days   I reviewed hospitalist notes, last 24 h vitals and pain scores, last 48 h intake and output, last 24 h labs and trends, and last 24 h imaging results.  This care required moderate level of medical decision making.    Juliet Rude, [facility] 05/17/2023, 9:28 AM Please see Amion for pager number during day hours 7:00am-4:30pm

## 2023-05-17 NOTE — Plan of Care (Signed)

## 2023-05-17 NOTE — Progress Notes (Signed)
 PROGRESS NOTE    [name]  JYN:[id number] DOB: [birth year] DOA: 05/14/2023 PCP: Tally Joe, MD   Brief Narrative:  [name] is a 68 y.o. female with medical history significant for obesity, type 2 diabetes on Mounjaro, diverticulosis, who presents with sudden onset abdominal pain.  CT abdomen pelvis concerning for obstructive distal small bowel obstruction.  Hospitalist called for admission and general surgery called in consult.  Assessment & Plan:   Principal Problem:   SBO (small bowel obstruction) (HCC)  SBO, suspect secondary to adhesions Complicated by [facility] Multiple prior abdominal surgeries General Surgery following, current plan for medical management Continue IV fluids until p.o. intake is more appropriate N.p.o. with ice chips, NG tube to intermittent suction, management per surgery Educated to increase ambulation as tolerated   Sepsis secondary to UTI, POA WBC 13.5, tachycardia noted at intake Symptoms complicated by small bowel obstruction and abdominal pain/lower pelvic pain Will treat x 3 days with ceftriaxone empirically to cover   Type 2 diabetes with hyperglycemia, uncontrolled Currently hypoglycemic Hemoglobin 7.6, down from 8.7 previously but still elevated Continues on Mounjaro, discussed discontinuation given above obstruction Downtrending glucose, initiate D10 at [facility] rate   Obesity BMI 30 Recommend weight loss outpatient with regular physical activity and healthy dieting. Would discontinue Mounjaro at discharge, follow-up with PCP for further evaluation and treatment  DVT prophylaxis: enoxaparin (LOVENOX) injection 40 mg Start: 05/15/23 1000   Code Status:   Code Status: Full Code  Family Communication: None present  Status is: Inpatient  Dispo: The patient is from: Home              Anticipated d/c is to: Home              Anticipated d/c date is: 24 to 48 hours              Patient currently not medically stable for  discharge  Consultants:  General surgery  Procedures:  None planned  Antimicrobials:  Ceftriaxone x 3 days  Subjective: No acute issues or events overnight, abdominal pain and distention markedly improved, still no BM or flatus  Objective: Vitals:   05/16/23 0900 05/16/23 1326 05/16/23 2014 05/17/23 0406  BP: (!) 128/100 (!) 163/91 (!) 177/80 (!) 184/97  Pulse: 91 96 98 100  Resp:  18 18 18   Temp: 98.6 F (37 C) 98.8 F (37.1 C) 99.4 F (37.4 C) 98.4 F (36.9 C)  TempSrc: Oral Oral Oral Oral  SpO2: 94% (!) 88% 93% (!) 87%  Weight:      Height:        Intake/Output Summary (Last 24 hours) at 05/17/2023 0733 Last data filed at 05/17/2023 0500 Gross per 24 hour  Intake 1749.44 ml  Output 1400 ml  Net 349.44 ml   Filed Weights   05/14/23 2038 05/15/23 0913  Weight: 89.4 kg 95.2 kg    Examination:  General:  Pleasantly resting in bed, No acute distress. HEENT: Tolerating NG tube intermittent suction with bilious fluid Neck:  Without mass or deformity.  Trachea is midline. Lungs:  Clear to auscultate bilaterally without rhonchi, wheeze, or rales. Heart:  Regular rate and rhythm.  Without murmurs, rubs, or gallops. Abdomen: Obese, soft, moderately tender diffusely without PMI rebound or guarding Extremities: Without cyanosis, clubbing, edema, or obvious deformity.  Data Reviewed: I have personally reviewed following labs and imaging studies  CBC: Recent Labs  Lab 05/14/23 2101 05/15/23 0111 05/16/23 0640  WBC 13.5* 12.2* 8.6  HGB 15.9*  14.6 13.1  HCT 46.5* 44.0 41.6  MCV 90.6 92.6 95.6  PLT 322 271 225   Basic Metabolic Panel: Recent Labs  Lab 05/14/23 2101 05/15/23 0111 05/15/23 0237 05/16/23 0640  NA 136  --  138 141  K 3.9  --  4.2 4.4  CL 101  --  102 100  CO2 21*  --  24 32  GLUCOSE 212*  --  145* 116*  BUN 17  --  18 26*  CREATININE 0.84 0.78 0.82 0.84  CALCIUM 10.2  --  9.6 9.2  MG  --   --  2.3  --   PHOS  --   --  5.1*  --     GFR: Estimated Creatinine Clearance: 76.9 mL/min (by C-G formula based on SCr of 0.84 mg/dL).  Liver Function Tests: Recent Labs  Lab 05/14/23 2101  AST 33  ALT 35  ALKPHOS 104  BILITOT 0.9  PROT 8.6*  ALBUMIN 4.6    Recent Labs  Lab 05/14/23 2101  LIPASE 22   HbA1C: Recent Labs    05/15/23 0111  HGBA1C 7.6*   CBG: Recent Labs  Lab 05/16/23 1139 05/16/23 1650 05/16/23 2015 05/16/23 2358 05/17/23 0401  GLUCAP 117* 108* 108* 98 103*   Recent Results (from the past 240 hours)  Blood culture (routine x 2)     Status: None (Preliminary result)   Collection Time: 05/14/23 12:00 AM   Specimen: BLOOD  Result Value Ref Range Status   Specimen Description   Final    BLOOD RIGHT ANTECUBITAL Performed at [facility], [address], [place], [place] [address]    Special Requests   Final    BOTTLES DRAWN AEROBIC AND ANAEROBIC Blood Culture adequate volume Performed at [facility], [address], [place], [place] [address]    Culture   Final    NO GROWTH 2 DAYS Performed at [facility], [address], [place], [place] [address]    Report Status PENDING  Incomplete  Urine Culture     Status: Abnormal (Preliminary result)   Collection Time: 05/14/23  9:01 PM   Specimen: Urine, Random  Result Value Ref Range Status   Specimen Description   Final    URINE, RANDOM Performed at [facility], [address], [place], [place] [address]    Special Requests   Final    NONE Performed at [facility], [address] [place], [place] [address]    Culture (A)  Final    >=100,000 COLONIES/mL ESCHERICHIA COLI SUSCEPTIBILITIES TO FOLLOW Performed at [facility], [address], [place], [place] [address]    Report Status PENDING  Incomplete  Blood culture (routine x 2)     Status: None (Preliminary result)   Collection Time: 05/14/23 11:58 PM   Specimen: BLOOD  Result Value  Ref Range Status   Specimen Description   Final    BLOOD LEFT ANTECUBITAL Performed at [facility], [address] [place], [place] [address]    Special Requests   Final    BOTTLES DRAWN AEROBIC AND ANAEROBIC Blood Culture results may not be optimal due to an inadequate volume of blood received in culture bottles Performed at [facility], [address] [place], [place] [address]    Culture   Final    NO GROWTH 2 DAYS Performed at [facility], [address], [place], [place] [address]  Report Status PENDING  Incomplete  Urine Culture     Status: Abnormal   Collection Time: 05/15/23  1:41 PM   Specimen: Urine, Random  Result Value Ref Range Status   Specimen Description   Final    URINE, RANDOM Performed at [facility], [address], [place], [place] [address]    Special Requests URINE, CLEAN CATCH  Final   Culture (A)  Final    <10,000 COLONIES/mL INSIGNIFICANT GROWTH Performed at [facility], [address], [place], [place] [address]    Report Status 05/16/2023 FINAL  Final     Radiology Studies: DG Abd 2 Views Result Date: 05/16/2023 CLINICAL DATA:  Small bowel obstruction. EXAM: ABDOMEN - 2 VIEW COMPARISON:  May 15, 2023. FINDINGS: Nasogastric tube tip is seen in proximal stomach. Dilated small bowel loops are noted concerning for distal small bowel obstruction or possibly ileus. No colonic dilatation is noted. Residual contrast is noted in nondilated right colon. IMPRESSION: Small bowel dilatation is now noted concerning for distal small bowel obstruction or possibly ileus. Electronically Signed   By: Lupita Raider M.D.   On: 05/16/2023 10:28   DG Abd Portable 1V-Small Bowel Obstruction Protocol-initial, 8 hr delay Result Date: 05/15/2023 CLINICAL DATA:  Small-bowel obstruction EXAM: PORTABLE ABDOMEN - 1 VIEW COMPARISON:  05/15/2023 FINDINGS: NG tube is position within the stomach. There  is enteric contrast within the gastric lumen. No contrast within the small bowel. Bowel gas pattern is nonobstructive. Excreted contrast is present within the urinary bladder. Cholecystectomy clips. IMPRESSION: 1. Nonobstructive bowel gas pattern. Enteric contrast is present within the stomach. 2. NG tube within the stomach. Electronically Signed   By: Duanne Guess D.O.   On: 05/15/2023 14:40   Scheduled Meds:  enoxaparin (LOVENOX) injection  40 mg Subcutaneous Q24H   insulin aspart  0-9 Units Subcutaneous Q4H   Continuous Infusions:  cefTRIAXone (ROCEPHIN)  IV 1 g (05/16/23 2100)     LOS: 2 days   Time spent:  Azucena Fallen, DO [facility]  If 7PM-7AM, please contact night-coverage [email or web address]  05/17/2023, 7:33 AM

## 2023-05-18 ENCOUNTER — Inpatient Hospital Stay (HOSPITAL_COMMUNITY): Payer: Medicare HMO

## 2023-05-18 DIAGNOSIS — K56609 Unspecified intestinal obstruction, unspecified as to partial versus complete obstruction: Secondary | ICD-10-CM | POA: Diagnosis not present

## 2023-05-18 LAB — CBC
HCT: 41.4 % (ref 36.0–46.0)
Hemoglobin: 13.4 g/dL (ref 12.0–15.0)
MCH: 30.9 pg (ref 26.0–34.0)
MCHC: 32.4 g/dL (ref 30.0–36.0)
MCV: 95.6 fL (ref 80.0–100.0)
Platelets: 204 10*3/uL (ref 150–400)
RBC: 4.33 MIL/uL (ref 3.87–5.11)
RDW: 13 % (ref 11.5–15.5)
WBC: 8.8 10*3/uL (ref 4.0–10.5)
nRBC: 0 % (ref 0.0–0.2)

## 2023-05-18 LAB — ABO/RH: ABO/RH(D): A POS

## 2023-05-18 LAB — TYPE AND SCREEN
ABO/RH(D): A POS
Antibody Screen: NEGATIVE
Unit division: 0

## 2023-05-18 LAB — BASIC METABOLIC PANEL
Anion gap: 15 (ref 5–15)
BUN: 17 mg/dL (ref 8–23)
CO2: 23 mmol/L (ref 22–32)
Calcium: 9.2 mg/dL (ref 8.9–10.3)
Chloride: 102 mmol/L (ref 98–111)
Creatinine, Ser: 0.56 mg/dL (ref 0.44–1.00)
GFR, Estimated: >60 mL/min (ref >60)
Glucose, Bld: 112 mg/dL — ABNORMAL HIGH (ref 70–99)
Potassium: 3.6 mmol/L (ref 3.5–5.1)
Sodium: 140 mmol/L (ref 135–145)

## 2023-05-18 LAB — GLUCOSE, CAPILLARY
Glucose-Capillary: 103 mg/dL — ABNORMAL HIGH (ref 70–99)
Glucose-Capillary: 107 mg/dL — ABNORMAL HIGH (ref 70–99)
Glucose-Capillary: 115 mg/dL — ABNORMAL HIGH (ref 70–99)
Glucose-Capillary: 123 mg/dL — ABNORMAL HIGH (ref 70–99)
Glucose-Capillary: 96 mg/dL (ref 70–99)
Glucose-Capillary: 99 mg/dL (ref 70–99)

## 2023-05-18 LAB — BPAM RBC
Blood Product Expiration Date: 202503232359
Unit Type and Rh: 6200

## 2023-05-18 LAB — URINE CULTURE: Culture: >=100000 — AB

## 2023-05-18 NOTE — Anesthesia Preprocedure Evaluation (Signed)
 Anesthesia Evaluation  Patient identified by MRN, date of birth, ID band Patient awake    Reviewed: Allergy & Precautions, NPO status , Patient's Chart, lab work & pertinent test results  History of Anesthesia Complications (+) PONV and history of anesthetic complications  Airway Mallampati: III  TM Distance: >3 FB Neck ROM: Full    Dental  (+) Dental Advisory Given, Teeth Intact, Missing   Pulmonary former smoker   Pulmonary exam normal breath sounds clear to auscultation       Cardiovascular hypertension, Pt. on medications + DVT   Rhythm:Regular Rate:Tachycardia     Neuro/Psych negative neurological ROS  negative psych ROS   GI/Hepatic Neg liver ROS,GERD  Medicated,, Bowel obstruction    Endo/Other  diabetes, Type 2, Insulin Dependent   Obesity  Renal/GU negative Renal ROS     Musculoskeletal negative musculoskeletal ROS (+)    Abdominal   Peds  Hematology negative hematology ROS (+)   Anesthesia Other Findings On GLP-1a   Reproductive/Obstetrics  Breast cancer                              Anesthesia Physical Anesthesia Plan  ASA: 3  Anesthesia Plan: General   Post-op Pain Management: Ofirmev IV (intra-op)*   Induction: Intravenous, Rapid sequence and Cricoid pressure planned  PONV Risk Score and Plan: 4 or greater and Treatment may vary due to age or medical condition, Ondansetron, Dexamethasone and Midazolam  Airway Management Planned: Oral ETT and Video Laryngoscope Planned  Additional Equipment: None  Intra-op Plan:   Post-operative Plan: Extubation in OR  Informed Consent: I have reviewed the patients History and Physical, chart, labs and discussed the procedure including the risks, benefits and alternatives for the proposed anesthesia with the patient or authorized representative who has indicated his/her understanding and acceptance.     Dental advisory  given  Plan Discussed with: CRNA  Anesthesia Plan Comments: (Risks of anesthesia explained at length. This includes, but is not limited to, sore throat, damage to teeth, lips gums, tongue and vocal cords, nausea and vomiting, reactions to medications, stroke, heart attack, and death. All patient questions were answered and the patient wishes to proceed. )        Anesthesia Quick Evaluation

## 2023-05-18 NOTE — H&P (View-Only) (Signed)
 Progress Note     Subjective: No improvement in bowel function over last 24 hrs. Abdomen is maybe slightly less distended but soreness about the same. She continues to ambulate. Reports NGT drained much more throughout day yesterday. Discussed possibility of OR today with no bowel function and patient understands and is agreeable to proceed.   Objective: Vital signs in last 24 hours: Temp:  [98.1 F (36.7 C)-98.2 F (36.8 C)] 98.1 F (36.7 C) (02/25 0359) Pulse Rate:  [94-96] 96 (02/25 0438) Resp:  [16-20] 16 (02/25 0359) BP: (167-186)/(72-98) 182/72 (02/25 0438) SpO2:  [91 %-92 %] 92 % (02/25 0359) Last BM Date :  (None)  Intake/Output from previous day: 02/24 0701 - 02/25 0700 In: 1941.8 [I.V.:1641.8; IV Piggyback:300] Out: 3700 [Urine:1000; Emesis/NG output:2700] Intake/Output this shift: No intake/output data recorded.  PE: General: pleasant, WD, obese female who is laying in bed in NAD Lungs: Respiratory effort nonlabored Abd: soft, mild generalized ttp without peritonitis, mild distention, NGT with thin bilious drainage Psych: A&Ox3 with an appropriate affect.    Lab Results:  Recent Labs    05/16/23 0640 05/18/23 0550  WBC 8.6 8.8  HGB 13.1 13.4  HCT 41.6 41.4  PLT 225 204   BMET Recent Labs    05/17/23 0927 05/18/23 0550  NA 139 140  K 3.7 3.6  CL 102 102  CO2 22 23  GLUCOSE 85 112*  BUN 22 17  CREATININE 0.53 0.56  CALCIUM 9.1 9.2   PT/INR No results for input(s): "LABPROT", "INR" in the last 72 hours. CMP     Component Value Date/Time   NA 140 05/18/2023 0550   K 3.6 05/18/2023 0550   CL 102 05/18/2023 0550   CO2 23 05/18/2023 0550   GLUCOSE 112 (H) 05/18/2023 0550   BUN 17 05/18/2023 0550   CREATININE 0.56 05/18/2023 0550   CREATININE 0.73 02/02/2022 0848   CALCIUM 9.2 05/18/2023 0550   PROT 8.6 (H) 05/14/2023 2101   ALBUMIN 4.6 05/14/2023 2101   AST 33 05/14/2023 2101   AST 21 02/02/2022 0848   ALT 35 05/14/2023 2101   ALT 23  02/02/2022 0848   ALKPHOS 104 05/14/2023 2101   BILITOT 0.9 05/14/2023 2101   BILITOT 0.3 02/02/2022 0848   GFRNONAA >60 05/18/2023 0550   GFRNONAA >60 02/02/2022 0848   Lipase     Component Value Date/Time   LIPASE 22 05/14/2023 2101       Studies/Results: DG Abd Portable 1V-Small Bowel Obstruction Protocol-initial, 8 hr delay Result Date: 05/17/2023 CLINICAL DATA:  Small-bowel obstruction, 8 hour delay imaging EXAM: PORTABLE ABDOMEN - 1 VIEW COMPARISON:  05/17/2023 FINDINGS: Supine frontal view of the abdomen and pelvis was obtained 8 hours after oral contrast administration. Stable enteric catheter overlying the gastric body. There is continued gaseous distention of the small bowel measuring up to 3.9 cm. Continued progression of oral contrast throughout the colon. No masses or abnormal calcifications. IMPRESSION: 1. Persistent gaseous distention of the small bowel, with continued progression of wall contrast throughout the colon. Findings may reflect resolving or intermittent small bowel obstruction. Electronically Signed   By: Sharlet Salina M.D.   On: 05/17/2023 22:07   DG Abd Portable 1V Result Date: 05/17/2023 CLINICAL DATA:  Small-bowel obstruction. EXAM: PORTABLE ABDOMEN - 1 VIEW COMPARISON:  Radiographs 05/16/2023 and 05/15/2023.  CT 05/14/2023. FINDINGS: 0739 hours. Tip of the enteric tube overlies the mid stomach. Most of the contrast previously demonstrated within the stomach has emptied from the stomach  and is present within nondilated distal small bowel and proximal colon. There are persistently dilated loops of small bowel in the left mid abdomen. No extravasated enteric contrast or pneumoperitoneum identified. Cholecystectomy clips, convex right thoracolumbar scoliosis and spondylosis again noted. IMPRESSION: Persistently dilated loops of small bowel in the left mid abdomen suggesting low grade partial small bowel obstruction. Contrast has passed into the proximal colon. No  extravasated contrast or pneumoperitoneum identified. Electronically Signed   By: Carey Bullocks M.D.   On: 05/17/2023 11:34    Anti-infectives: Anti-infectives (From admission, onward)    Start     Dose/Rate Route Frequency Ordered Stop   05/15/23 2200  cefTRIAXone (ROCEPHIN) 1 g in sodium chloride 0.9 % 100 mL IVPB        1 g 200 mL/hr over 30 Minutes Intravenous Every 24 hours 05/15/23 0628     05/14/23 2345  cefTRIAXone (ROCEPHIN) 1 g in sodium chloride 0.9 % 100 mL IVPB        1 g 200 mL/hr over 30 Minutes Intravenous  Once 05/14/23 2344 05/15/23 0102        Assessment/Plan  SBO - hx of TAH and lap chole - NGT with 2700 out in last 24h, pt does report she is taking ice chips - No bowel function, KUB stable to maybe slightly improved on my eval, no peritonitis  - SBO protocol repeated yesterday without progression - recommend proceeding to OR today for exploratory laparotomy and possible bowel resection. Risks vs benefits discussed with patient and she is agreeable to proceed.   FEN: NPO, NGT to LIWS, LR@100cc /h VTE: LMWH ID: rocephin for UTI   - per TRH -  UTI Uncontrolled T2DM HTN HLD Hx of breast cancer s/p lumpectomy and chemo/rad  LOS: 3 days   I reviewed hospitalist notes, last 24 h vitals and pain scores, last 48 h intake and output, last 24 h labs and trends, and last 24 h imaging results.  This care required high  level of medical decision making.    Juliet Rude, [facility] 05/18/2023, 9:14 AM Please see Amion for pager number during day hours 7:00am-4:30pm

## 2023-05-18 NOTE — Progress Notes (Signed)
 Progress Note     Subjective: No improvement in bowel function over last 24 hrs. Abdomen is maybe slightly less distended but soreness about the same. She continues to ambulate. Reports NGT drained much more throughout day yesterday. Discussed possibility of OR today with no bowel function and patient understands and is agreeable to proceed.   Objective: Vital signs in last 24 hours: Temp:  [98.1 F (36.7 C)-98.2 F (36.8 C)] 98.1 F (36.7 C) (02/25 0359) Pulse Rate:  [94-96] 96 (02/25 0438) Resp:  [16-20] 16 (02/25 0359) BP: (167-186)/(72-98) 182/72 (02/25 0438) SpO2:  [91 %-92 %] 92 % (02/25 0359) Last BM Date :  (None)  Intake/Output from previous day: 02/24 0701 - 02/25 0700 In: 1941.8 [I.V.:1641.8; IV Piggyback:300] Out: 3700 [Urine:1000; Emesis/NG output:2700] Intake/Output this shift: No intake/output data recorded.  PE: General: pleasant, WD, obese female who is laying in bed in NAD Lungs: Respiratory effort nonlabored Abd: soft, mild generalized ttp without peritonitis, mild distention, NGT with thin bilious drainage Psych: A&Ox3 with an appropriate affect.    Lab Results:  Recent Labs    05/16/23 0640 05/18/23 0550  WBC 8.6 8.8  HGB 13.1 13.4  HCT 41.6 41.4  PLT 225 204   BMET Recent Labs    05/17/23 0927 05/18/23 0550  NA 139 140  K 3.7 3.6  CL 102 102  CO2 22 23  GLUCOSE 85 112*  BUN 22 17  CREATININE 0.53 0.56  CALCIUM 9.1 9.2   PT/INR No results for input(s): "LABPROT", "INR" in the last 72 hours. CMP     Component Value Date/Time   NA 140 05/18/2023 0550   K 3.6 05/18/2023 0550   CL 102 05/18/2023 0550   CO2 23 05/18/2023 0550   GLUCOSE 112 (H) 05/18/2023 0550   BUN 17 05/18/2023 0550   CREATININE 0.56 05/18/2023 0550   CREATININE 0.73 02/02/2022 0848   CALCIUM 9.2 05/18/2023 0550   PROT 8.6 (H) 05/14/2023 2101   ALBUMIN 4.6 05/14/2023 2101   AST 33 05/14/2023 2101   AST 21 02/02/2022 0848   ALT 35 05/14/2023 2101   ALT 23  02/02/2022 0848   ALKPHOS 104 05/14/2023 2101   BILITOT 0.9 05/14/2023 2101   BILITOT 0.3 02/02/2022 0848   GFRNONAA >60 05/18/2023 0550   GFRNONAA >60 02/02/2022 0848   Lipase     Component Value Date/Time   LIPASE 22 05/14/2023 2101       Studies/Results: DG Abd Portable 1V-Small Bowel Obstruction Protocol-initial, 8 hr delay Result Date: 05/17/2023 CLINICAL DATA:  Small-bowel obstruction, 8 hour delay imaging EXAM: PORTABLE ABDOMEN - 1 VIEW COMPARISON:  05/17/2023 FINDINGS: Supine frontal view of the abdomen and pelvis was obtained 8 hours after oral contrast administration. Stable enteric catheter overlying the gastric body. There is continued gaseous distention of the small bowel measuring up to 3.9 cm. Continued progression of oral contrast throughout the colon. No masses or abnormal calcifications. IMPRESSION: 1. Persistent gaseous distention of the small bowel, with continued progression of wall contrast throughout the colon. Findings may reflect resolving or intermittent small bowel obstruction. Electronically Signed   By: Sharlet Salina M.D.   On: 05/17/2023 22:07   DG Abd Portable 1V Result Date: 05/17/2023 CLINICAL DATA:  Small-bowel obstruction. EXAM: PORTABLE ABDOMEN - 1 VIEW COMPARISON:  Radiographs 05/16/2023 and 05/15/2023.  CT 05/14/2023. FINDINGS: 0739 hours. Tip of the enteric tube overlies the mid stomach. Most of the contrast previously demonstrated within the stomach has emptied from the stomach  and is present within nondilated distal small bowel and proximal colon. There are persistently dilated loops of small bowel in the left mid abdomen. No extravasated enteric contrast or pneumoperitoneum identified. Cholecystectomy clips, convex right thoracolumbar scoliosis and spondylosis again noted. IMPRESSION: Persistently dilated loops of small bowel in the left mid abdomen suggesting low grade partial small bowel obstruction. Contrast has passed into the proximal colon. No  extravasated contrast or pneumoperitoneum identified. Electronically Signed   By: Carey Bullocks M.D.   On: 05/17/2023 11:34    Anti-infectives: Anti-infectives (From admission, onward)    Start     Dose/Rate Route Frequency Ordered Stop   05/15/23 2200  cefTRIAXone (ROCEPHIN) 1 g in sodium chloride 0.9 % 100 mL IVPB        1 g 200 mL/hr over 30 Minutes Intravenous Every 24 hours 05/15/23 0628     05/14/23 2345  cefTRIAXone (ROCEPHIN) 1 g in sodium chloride 0.9 % 100 mL IVPB        1 g 200 mL/hr over 30 Minutes Intravenous  Once 05/14/23 2344 05/15/23 0102        Assessment/Plan  SBO - hx of TAH and lap chole - NGT with 2700 out in last 24h, pt does report she is taking ice chips - No bowel function, KUB stable to maybe slightly improved on my eval, no peritonitis  - SBO protocol repeated yesterday without progression - recommend proceeding to OR today for exploratory laparotomy and possible bowel resection. Risks vs benefits discussed with patient and she is agreeable to proceed.   FEN: NPO, NGT to LIWS, LR@100cc /h VTE: LMWH ID: rocephin for UTI   - per TRH -  UTI Uncontrolled T2DM HTN HLD Hx of breast cancer s/p lumpectomy and chemo/rad  LOS: 3 days   I reviewed hospitalist notes, last 24 h vitals and pain scores, last 48 h intake and output, last 24 h labs and trends, and last 24 h imaging results.  This care required high  level of medical decision making.    Juliet Rude, [facility] 05/18/2023, 9:14 AM Please see Amion for pager number during day hours 7:00am-4:30pm

## 2023-05-18 NOTE — Progress Notes (Signed)
 PROGRESS NOTE    MYRLENE RIERA  BMW:[id number] DOB: [birth year] DOA: 05/14/2023 PCP: Tally Joe, MD   Brief Narrative:  [name] is a 68 y.o. female with medical history significant for obesity, type 2 diabetes on Mounjaro, diverticulosis, who presents with sudden onset abdominal pain.  CT abdomen pelvis concerning for obstructive distal small bowel obstruction.  Hospitalist called for admission and general surgery called in consult.  Assessment & Plan:   Principal Problem:   SBO (small bowel obstruction) (HCC)  SBO, suspect secondary to adhesions Complicated by [facility] Multiple prior abdominal surgeries General Surgery following Unfortunately patient continues to have poor progress, tentative plan for surgical intervention later this afternoon on the 25th NG tube to intermittent suction, will continue postoperatively per discussion with surgery Potential need for TPN postoperatively given suspected prolonged ileus   Sepsis secondary to UTI, POA, resolved WBC 13.5, tachycardia noted at intake Antibiotics completed   Type 2 diabetes with hyperglycemia, uncontrolled Currently hypoglycemic Hemoglobin 7.6, down from 8.7 previously but still elevated Continues on Mounjaro, discussed discontinuation given above obstruction Downtrending glucose, continue D10 at [facility] rate -possible need for TPN; see above   Obesity Body mass index is 32.87 kg/m. Recommend weight loss outpatient with regular physical activity and healthy dieting. Would discontinue Mounjaro at discharge, follow-up with PCP for further evaluation and treatment  DVT prophylaxis: enoxaparin (LOVENOX) injection 40 mg Start: 05/15/23 1000   Code Status:   Code Status: Full Code  Family Communication: None present  Status is: Inpatient  Dispo: The patient is from: Home              Anticipated d/c is to: Home              Anticipated d/c date is: 24 to 48 hours              Patient currently not medically  stable for discharge  Consultants:  General surgery  Procedures:  Exploratory lap vs open  Antimicrobials:  Ceftriaxone x 3 days  Subjective: No acute issues or events overnight, abdominal pain and distention ongoing - minimally improved from prior, still no BM or flatus  Objective: Vitals:   05/17/23 1333 05/17/23 2011 05/18/23 0359 05/18/23 0438  BP: (!) 167/76 (!) 185/98 (!) 186/80 (!) 182/72  Pulse: 95 96 94 96  Resp: 20 18 16    Temp: 98.2 F (36.8 C)  98.1 F (36.7 C)   TempSrc: Oral  Oral   SpO2: 91% 91% 92%   Weight:      Height:        Intake/Output Summary (Last 24 hours) at 05/18/2023 0755 Last data filed at 05/18/2023 0400 Gross per 24 hour  Intake 1941.8 ml  Output 3700 ml  Net -1758.2 ml   Filed Weights   05/14/23 2038 05/15/23 0913  Weight: 89.4 kg 95.2 kg    Examination:  General:  Pleasantly resting in bed, No acute distress. HEENT: Tolerating NG tube intermittent suction with bilious fluid Neck:  Without mass or deformity.  Trachea is midline. Lungs:  Clear to auscultate bilaterally without rhonchi, wheeze, or rales. Heart:  Regular rate and rhythm.  Without murmurs, rubs, or gallops. Abdomen: Obese, soft, moderately tender diffusely without PMI rebound or guarding Extremities: Without cyanosis, clubbing, edema, or obvious deformity.  Data Reviewed: I have personally reviewed following labs and imaging studies  CBC: Recent Labs  Lab 05/14/23 2101 05/15/23 0111 05/16/23 0640 05/18/23 0550  WBC 13.5* 12.2* 8.6 8.8  HGB 15.9*  14.6 13.1 13.4  HCT 46.5* 44.0 41.6 41.4  MCV 90.6 92.6 95.6 95.6  PLT 322 271 225 204   Basic Metabolic Panel: Recent Labs  Lab 05/14/23 2101 05/15/23 0111 05/15/23 0237 05/16/23 0640 05/17/23 0927 05/18/23 0550  NA 136  --  138 141 139 140  K 3.9  --  4.2 4.4 3.7 3.6  CL 101  --  102 100 102 102  CO2 21*  --  24 32 22 23  GLUCOSE 212*  --  145* 116* 85 112*  BUN 17  --  18 26* 22 17  CREATININE 0.84  0.78 0.82 0.84 0.53 0.56  CALCIUM 10.2  --  9.6 9.2 9.1 9.2  MG  --   --  2.3  --  2.0  --   PHOS  --   --  5.1*  --   --   --    GFR: Estimated Creatinine Clearance: 80.8 mL/min (by C-G formula based on SCr of 0.56 mg/dL).  Liver Function Tests: Recent Labs  Lab 05/14/23 2101  AST 33  ALT 35  ALKPHOS 104  BILITOT 0.9  PROT 8.6*  ALBUMIN 4.6    Recent Labs  Lab 05/14/23 2101  LIPASE 22   HbA1C: No results for input(s): "HGBA1C" in the last 72 hours.  CBG: Recent Labs  Lab 05/17/23 1748 05/17/23 2020 05/18/23 0010 05/18/23 0421 05/18/23 0735  GLUCAP 92 91 107* 96 123*   Recent Results (from the past 240 hours)  Blood culture (routine x 2)     Status: None (Preliminary result)   Collection Time: 05/14/23 12:00 AM   Specimen: BLOOD  Result Value Ref Range Status   Specimen Description   Final    BLOOD RIGHT ANTECUBITAL Performed at [facility], [address] [place], [place] [address]    Special Requests   Final    BOTTLES DRAWN AEROBIC AND ANAEROBIC Blood Culture adequate volume Performed at [facility], [address], [place], [place] [address]    Culture   Final    NO GROWTH 3 DAYS Performed at [facility], [address] [place], [place] [address]    Report Status PENDING  Incomplete  Urine Culture     Status: Abnormal (Preliminary result)   Collection Time: 05/14/23  9:01 PM   Specimen: Urine, Random  Result Value Ref Range Status   Specimen Description URINE, RANDOM  Final   Special Requests NONE  Final   Culture (A)  Final    >=100,000 COLONIES/mL ESCHERICHIA COLI SUSCEPTIBILITIES TO FOLLOW    Report Status PENDING  Incomplete   Organism ID, Bacteria ESCHERICHIA COLI (A)  Final      Susceptibility   Escherichia coli - MIC*    AMPICILLIN >=32 RESISTANT Resistant     CEFAZOLIN <=4 SENSITIVE Sensitive     CEFEPIME <=0.12 SENSITIVE Sensitive     CEFTRIAXONE <=0.25 SENSITIVE Sensitive      CIPROFLOXACIN <=0.25 SENSITIVE Sensitive     GENTAMICIN <=1 SENSITIVE Sensitive     IMIPENEM <=0.25 SENSITIVE Sensitive     NITROFURANTOIN <=16 SENSITIVE Sensitive     TRIMETH/SULFA <=20 SENSITIVE Sensitive     AMPICILLIN/SULBACTAM 16 INTERMEDIATE Intermediate     PIP/TAZO Value in next row Sensitive ug/mL     <=4 SENSITIVEPerformed at [facility], [address], [place], [place] [address]    * >=100,000 COLONIES/mL ESCHERICHIA COLI  Blood culture (routine x 2)  Status: None (Preliminary result)   Collection Time: 05/14/23 11:58 PM   Specimen: BLOOD  Result Value Ref Range Status   Specimen Description   Final    BLOOD LEFT ANTECUBITAL Performed at [facility], [address], [place], [place] [address]    Special Requests   Final    BOTTLES DRAWN AEROBIC AND ANAEROBIC Blood Culture results may not be optimal due to an inadequate volume of blood received in culture bottles Performed at [facility], [address], [place], [place] [address]    Culture   Final    NO GROWTH 3 DAYS Performed at [facility], [address], [place], [place] [address]    Report Status PENDING  Incomplete  Urine Culture     Status: Abnormal   Collection Time: 05/15/23  1:41 PM   Specimen: Urine, Random  Result Value Ref Range Status   Specimen Description   Final    URINE, RANDOM Performed at [facility], [address], [place], [place] [address]    Special Requests URINE, CLEAN CATCH  Final   Culture (A)  Final    <10,000 COLONIES/mL INSIGNIFICANT GROWTH Performed at [facility], [address], [place], [place] [address]    Report Status 05/16/2023 FINAL  Final     Radiology Studies: DG Abd Portable 1V-Small Bowel Obstruction Protocol-initial, 8 hr delay Result Date: 05/17/2023 CLINICAL DATA:  Small-bowel obstruction, 8 hour delay imaging EXAM: PORTABLE ABDOMEN - 1 VIEW COMPARISON:  05/17/2023 FINDINGS:  Supine frontal view of the abdomen and pelvis was obtained 8 hours after oral contrast administration. Stable enteric catheter overlying the gastric body. There is continued gaseous distention of the small bowel measuring up to 3.9 cm. Continued progression of oral contrast throughout the colon. No masses or abnormal calcifications. IMPRESSION: 1. Persistent gaseous distention of the small bowel, with continued progression of wall contrast throughout the colon. Findings may reflect resolving or intermittent small bowel obstruction. Electronically Signed   By: Sharlet Salina M.D.   On: 05/17/2023 22:07   DG Abd Portable 1V Result Date: 05/17/2023 CLINICAL DATA:  Small-bowel obstruction. EXAM: PORTABLE ABDOMEN - 1 VIEW COMPARISON:  Radiographs 05/16/2023 and 05/15/2023.  CT 05/14/2023. FINDINGS: 0739 hours. Tip of the enteric tube overlies the mid stomach. Most of the contrast previously demonstrated within the stomach has emptied from the stomach and is present within nondilated distal small bowel and proximal colon. There are persistently dilated loops of small bowel in the left mid abdomen. No extravasated enteric contrast or pneumoperitoneum identified. Cholecystectomy clips, convex right thoracolumbar scoliosis and spondylosis again noted. IMPRESSION: Persistently dilated loops of small bowel in the left mid abdomen suggesting low grade partial small bowel obstruction. Contrast has passed into the proximal colon. No extravasated contrast or pneumoperitoneum identified. Electronically Signed   By: Carey Bullocks M.D.   On: 05/17/2023 11:34   DG Abd 2 Views Result Date: 05/16/2023 CLINICAL DATA:  Small bowel obstruction. EXAM: ABDOMEN - 2 VIEW COMPARISON:  May 15, 2023. FINDINGS: Nasogastric tube tip is seen in proximal stomach. Dilated small bowel loops are noted concerning for distal small bowel obstruction or possibly ileus. No colonic dilatation is noted. Residual contrast is noted in nondilated  right colon. IMPRESSION: Small bowel dilatation is now noted concerning for distal small bowel obstruction or possibly ileus. Electronically Signed   By: Lupita Raider M.D.   On: 05/16/2023 10:28   Scheduled Meds:  enoxaparin (LOVENOX)  injection  40 mg Subcutaneous Q24H   insulin aspart  0-9 Units Subcutaneous Q4H   Continuous Infusions:  cefTRIAXone (ROCEPHIN)  IV 200 mL/hr at 05/18/23 0400   dextrose 10 mL/hr at 05/17/23 2304   lactated ringers 100 mL/hr (05/18/23 0646)     LOS: 3 days   Time spent:  Azucena Fallen, DO [facility]  If 7PM-7AM, please contact night-coverage [email or web address]  05/18/2023, 7:55 AM

## 2023-05-18 NOTE — Plan of Care (Signed)

## 2023-05-19 ENCOUNTER — Other Ambulatory Visit: Payer: Self-pay

## 2023-05-19 ENCOUNTER — Encounter (HOSPITAL_COMMUNITY)
Admission: EM | Disposition: A | Discharge status: Home / Self Care | Payer: Self-pay | Source: Home / Self Care | Attending: Internal Medicine

## 2023-05-19 ENCOUNTER — Inpatient Hospital Stay (HOSPITAL_COMMUNITY): Payer: Self-pay | Admitting: Anesthesiology

## 2023-05-19 ENCOUNTER — Encounter (HOSPITAL_COMMUNITY): Payer: Self-pay | Admitting: Internal Medicine

## 2023-05-19 DIAGNOSIS — I10 Essential (primary) hypertension: Secondary | ICD-10-CM

## 2023-05-19 DIAGNOSIS — K56609 Unspecified intestinal obstruction, unspecified as to partial versus complete obstruction: Secondary | ICD-10-CM

## 2023-05-19 DIAGNOSIS — E119 Type 2 diabetes mellitus without complications: Secondary | ICD-10-CM

## 2023-05-19 DIAGNOSIS — Z794 Long term (current) use of insulin: Secondary | ICD-10-CM

## 2023-05-19 HISTORY — PX: LAPAROTOMY: SHX154

## 2023-05-19 LAB — GLUCOSE, CAPILLARY
Glucose-Capillary: 112 mg/dL — ABNORMAL HIGH (ref 70–99)
Glucose-Capillary: 120 mg/dL — ABNORMAL HIGH (ref 70–99)
Glucose-Capillary: 120 mg/dL — ABNORMAL HIGH (ref 70–99)
Glucose-Capillary: 121 mg/dL — ABNORMAL HIGH (ref 70–99)
Glucose-Capillary: 142 mg/dL — ABNORMAL HIGH (ref 70–99)
Glucose-Capillary: 97 mg/dL (ref 70–99)

## 2023-05-19 LAB — SURGICAL PCR SCREEN
MRSA, PCR: NEGATIVE
Staphylococcus aureus: POSITIVE — AB

## 2023-05-19 LAB — PREALBUMIN: Prealbumin: 14 mg/dL — ABNORMAL LOW (ref 18–38)

## 2023-05-19 SURGERY — LAPAROTOMY, EXPLORATORY
Anesthesia: General

## 2023-05-19 MED ORDER — DEXAMETHASONE SODIUM PHOSPHATE 4 MG/ML IJ SOLN
INTRAMUSCULAR | Status: DC | PRN
  Administered 2023-05-19: 5 mg via INTRAVENOUS

## 2023-05-19 MED ORDER — FENTANYL 50 MCG/ML IV PCA SOLN
INTRAVENOUS | Status: DC
  Administered 2023-05-19: 30 ug via INTRAVENOUS
  Administered 2023-05-20 (×2): 40 ug via INTRAVENOUS
  Administered 2023-05-20: 10 ug via INTRAVENOUS
  Administered 2023-05-20 (×2): 50 ug via INTRAVENOUS
  Administered 2023-05-20: 10 ug via INTRAVENOUS
  Administered 2023-05-21: 30 ug via INTRAVENOUS
  Administered 2023-05-21: 120 ug via INTRAVENOUS
  Administered 2023-05-21: 20 ug via INTRAVENOUS
  Administered 2023-05-21: 30 ug via INTRAVENOUS
  Filled 2023-05-19 (×2): qty 25

## 2023-05-19 MED ORDER — ONDANSETRON HCL 4 MG/2ML IJ SOLN: 4.0000 mg | Freq: Once | INTRAMUSCULAR | Status: DC | PRN

## 2023-05-19 MED ORDER — SUGAMMADEX SODIUM 200 MG/2ML IV SOLN
INTRAVENOUS | Status: DC | PRN
  Administered 2023-05-19: 200 mg via INTRAVENOUS

## 2023-05-19 MED ORDER — DIPHENHYDRAMINE HCL 12.5 MG/5ML PO ELIX: 12.5000 mg | ORAL_SOLUTION | Freq: Four times a day (QID) | ORAL | Status: DC | PRN

## 2023-05-19 MED ORDER — ORAL CARE MOUTH RINSE: 15.0000 mL | Freq: Once | OROMUCOSAL | Status: AC

## 2023-05-19 MED ORDER — MIDAZOLAM HCL 2 MG/2ML IJ SOLN
INTRAMUSCULAR | Status: DC | PRN
  Administered 2023-05-19 (×2): 1 mg via INTRAVENOUS

## 2023-05-19 MED ORDER — ONDANSETRON HCL 4 MG/2ML IJ SOLN
INTRAMUSCULAR | Status: DC | PRN
  Administered 2023-05-19: 4 mg via INTRAVENOUS

## 2023-05-19 MED ORDER — PHENYLEPHRINE HCL-NACL 20-0.9 MG/250ML-% IV SOLN
INTRAVENOUS | Status: DC | PRN
  Administered 2023-05-19: 100 ug via INTRAVENOUS

## 2023-05-19 MED ORDER — SODIUM CHLORIDE 0.9% FLUSH: 9.0000 mL | INTRAVENOUS | Status: DC | PRN

## 2023-05-19 MED ORDER — METHOCARBAMOL 1000 MG/10ML IJ SOLN
500.0000 mg | Freq: Three times a day (TID) | INTRAMUSCULAR | Status: DC
  Administered 2023-05-19 – 2023-05-21 (×6): 500 mg via INTRAVENOUS
  Filled 2023-05-19 (×7): qty 5

## 2023-05-19 MED ORDER — CHLORHEXIDINE GLUCONATE CLOTH 2 % EX PADS
6.0000 | MEDICATED_PAD | Freq: Every day | CUTANEOUS | Status: DC
  Administered 2023-05-19 – 2023-05-22 (×4): 6 via TOPICAL

## 2023-05-19 MED ORDER — FENTANYL CITRATE (PF) 100 MCG/2ML IJ SOLN
INTRAMUSCULAR | Status: DC | PRN
  Administered 2023-05-19 (×2): 50 ug via INTRAVENOUS

## 2023-05-19 MED ORDER — DIPHENHYDRAMINE HCL 50 MG/ML IJ SOLN: 12.5000 mg | Freq: Four times a day (QID) | INTRAMUSCULAR | Status: DC | PRN

## 2023-05-19 MED ORDER — ROCURONIUM BROMIDE 100 MG/10ML IV SOLN
INTRAVENOUS | Status: DC | PRN
  Administered 2023-05-19: 50 mg via INTRAVENOUS

## 2023-05-19 MED ORDER — NALOXONE HCL 0.4 MG/ML IJ SOLN: 0.4000 mg | INTRAMUSCULAR | Status: DC | PRN

## 2023-05-19 MED ORDER — SODIUM CHLORIDE 0.9 % IV SOLN: INTRAVENOUS | Status: DC | PRN

## 2023-05-19 MED ORDER — MUPIROCIN 2 % EX OINT
1.0000 | TOPICAL_OINTMENT | Freq: Two times a day (BID) | CUTANEOUS | Status: DC
  Administered 2023-05-19 – 2023-05-22 (×7): 1 via NASAL
  Filled 2023-05-19 (×2): qty 22

## 2023-05-19 MED ORDER — ACETAMINOPHEN 10 MG/ML IV SOLN
1000.0000 mg | Freq: Four times a day (QID) | INTRAVENOUS | Status: AC
  Administered 2023-05-19 – 2023-05-20 (×4): 1000 mg via INTRAVENOUS
  Filled 2023-05-19 (×4): qty 100

## 2023-05-19 MED ORDER — LIDOCAINE HCL (CARDIAC) PF 100 MG/5ML IV SOSY
PREFILLED_SYRINGE | INTRAVENOUS | Status: DC | PRN
Start: 2023-05-19 — End: 2023-05-19
  Administered 2023-05-19: 100 mg via INTRAVENOUS

## 2023-05-19 MED ORDER — OXYCODONE HCL 5 MG PO TABS: 5.0000 mg | ORAL_TABLET | Freq: Once | ORAL | Status: DC | PRN

## 2023-05-19 MED ORDER — KETAMINE HCL 50 MG/5ML IJ SOSY
PREFILLED_SYRINGE | INTRAMUSCULAR | Status: AC
  Filled 2023-05-19: qty 5

## 2023-05-19 MED ORDER — LACTATED RINGERS IV SOLN: INTRAVENOUS | Status: DC

## 2023-05-19 MED ORDER — SODIUM CHLORIDE 0.9 % IR SOLN
Status: DC | PRN
  Administered 2023-05-19 (×3): 1000 mL

## 2023-05-19 MED ORDER — FENTANYL CITRATE PF 50 MCG/ML IJ SOSY
PREFILLED_SYRINGE | INTRAMUSCULAR | Status: AC
  Filled 2023-05-19: qty 1

## 2023-05-19 MED ORDER — OXYCODONE HCL 5 MG/5ML PO SOLN: 5.0000 mg | Freq: Once | ORAL | Status: DC | PRN

## 2023-05-19 MED ORDER — MIDAZOLAM HCL 2 MG/2ML IJ SOLN
INTRAMUSCULAR | Status: AC
  Filled 2023-05-19: qty 2

## 2023-05-19 MED ORDER — SUCCINYLCHOLINE CHLORIDE 200 MG/10ML IV SOSY
PREFILLED_SYRINGE | INTRAVENOUS | Status: DC | PRN
  Administered 2023-05-19: 120 mg via INTRAVENOUS

## 2023-05-19 MED ORDER — PHENYLEPHRINE HCL (PRESSORS) 10 MG/ML IV SOLN
INTRAVENOUS | Status: DC | PRN
  Administered 2023-05-19: 80 ug via INTRAVENOUS

## 2023-05-19 MED ORDER — DEXTROSE 5 % IV SOLN
INTRAVENOUS | Status: DC | PRN
  Administered 2023-05-19: 2 g via INTRAVENOUS

## 2023-05-19 MED ORDER — SODIUM CHLORIDE 0.9 % IV SOLN
2.0000 g | Freq: Once | INTRAVENOUS | Status: DC
  Filled 2023-05-19 (×2): qty 20

## 2023-05-19 MED ORDER — ONDANSETRON HCL 4 MG/2ML IJ SOLN: 4.0000 mg | Freq: Four times a day (QID) | INTRAMUSCULAR | Status: DC | PRN

## 2023-05-19 MED ORDER — FENTANYL CITRATE PF 50 MCG/ML IJ SOSY: 25.0000 ug | PREFILLED_SYRINGE | INTRAMUSCULAR | Status: DC | PRN

## 2023-05-19 MED ORDER — HYDROMORPHONE HCL 1 MG/ML IJ SOLN
INTRAMUSCULAR | Status: DC | PRN
  Administered 2023-05-19: .4 mg via INTRAVENOUS

## 2023-05-19 MED ORDER — FENTANYL CITRATE (PF) 100 MCG/2ML IJ SOLN
INTRAMUSCULAR | Status: AC
  Filled 2023-05-19: qty 2

## 2023-05-19 MED ORDER — HYDROMORPHONE HCL 2 MG/ML IJ SOLN
INTRAMUSCULAR | Status: AC
Start: 2023-05-19 — End: ?
  Filled 2023-05-19: qty 1

## 2023-05-19 MED ORDER — INSULIN ASPART 100 UNIT/ML IJ SOLN: 0.0000 [IU] | INTRAMUSCULAR | Status: DC | PRN

## 2023-05-19 MED ORDER — CHLORHEXIDINE GLUCONATE 0.12 % MT SOLN
15.0000 mL | Freq: Once | OROMUCOSAL | Status: AC
  Administered 2023-05-19: 15 mL via OROMUCOSAL

## 2023-05-19 MED ORDER — KETAMINE HCL 10 MG/ML IJ SOLN
INTRAMUSCULAR | Status: DC | PRN
  Administered 2023-05-19: 25 mg via INTRAVENOUS

## 2023-05-19 SURGICAL SUPPLY — 38 items
APPLICATOR COTTON TIP 6 STRL (MISCELLANEOUS) ×1 IMPLANT
APPLICATOR COTTON TIP 6IN STRL (MISCELLANEOUS) ×1 IMPLANT
BAG COUNTER SPONGE SURGICOUNT (BAG) IMPLANT
BLADE EXTENDED COATED 6.5IN (ELECTRODE) IMPLANT
BLADE HEX COATED 2.75 (ELECTRODE) ×1 IMPLANT
CHLORAPREP W/TINT 26 (MISCELLANEOUS) ×1 IMPLANT
COVER MAYO STAND STRL (DRAPES) IMPLANT
COVER SURGICAL LIGHT HANDLE (MISCELLANEOUS) ×1 IMPLANT
DRAPE LAPAROSCOPIC ABDOMINAL (DRAPES) ×1 IMPLANT
DRAPE WARM FLUID 44X44 (DRAPES) IMPLANT
DRSG OPSITE POSTOP 4X10 (GAUZE/BANDAGES/DRESSINGS) IMPLANT
ELECT REM PT RETURN 15FT ADLT (MISCELLANEOUS) ×1 IMPLANT
GAUZE SPONGE 4X4 12PLY STRL (GAUZE/BANDAGES/DRESSINGS) ×1 IMPLANT
GLOVE BIO SURGEON STRL SZ7.5 (GLOVE) ×2 IMPLANT
GLOVE BIOGEL PI IND STRL 7.0 (GLOVE) ×1 IMPLANT
GOWN STRL REUS W/ TWL LRG LVL3 (GOWN DISPOSABLE) ×1 IMPLANT
GOWN STRL REUS W/ TWL XL LVL3 (GOWN DISPOSABLE) ×1 IMPLANT
HANDLE SUCTION POOLE (INSTRUMENTS) IMPLANT
KIT BASIN OR (CUSTOM PROCEDURE TRAY) ×1 IMPLANT
KIT TURNOVER KIT A (KITS) IMPLANT
LIGASURE IMPACT 36 18CM CVD LR (INSTRUMENTS) IMPLANT
NS IRRIG 1000ML POUR BTL (IV SOLUTION) ×1 IMPLANT
PACK GENERAL/GYN (CUSTOM PROCEDURE TRAY) ×1 IMPLANT
RELOAD PROXIMATE 75MM BLUE (ENDOMECHANICALS) IMPLANT
RELOAD STAPLE 75 3.8 BLU REG (ENDOMECHANICALS) IMPLANT
SPONGE T-LAP 18X18 ~~LOC~~+RFID (SPONGE) IMPLANT
STAPLER PROXIMATE 75MM BLUE (STAPLE) ×1 IMPLANT
STAPLER SKIN PROX WIDE 3.9 (STAPLE) ×1 IMPLANT
SUCTION POOLE HANDLE (INSTRUMENTS) IMPLANT
SUT PDS AB 1 TP1 96 (SUTURE) IMPLANT
SUT SILK 2 0 SH CR/8 (SUTURE) IMPLANT
SUT SILK 2-0 18XBRD TIE 12 (SUTURE) IMPLANT
SUT SILK 3 0 SH CR/8 (SUTURE) IMPLANT
SUT SILK 3-0 18XBRD TIE 12 (SUTURE) IMPLANT
TOWEL OR 17X26 10 PK STRL BLUE (TOWEL DISPOSABLE) ×2 IMPLANT
TRAY FOLEY MTR SLVR 16FR STAT (SET/KITS/TRAYS/PACK) IMPLANT
WATER STERILE IRR 1000ML POUR (IV SOLUTION) ×1 IMPLANT
YANKAUER SUCT BULB TIP NO VENT (SUCTIONS) IMPLANT

## 2023-05-19 NOTE — Progress Notes (Signed)
   05/19/23 1239  TOC Brief Assessment  Insurance and Status Reviewed  Patient has primary care physician Yes  Home environment has been reviewed [address]   [place] [address]  Prior level of function: mod independent  Prior/Current Home Services No current home services  Social Drivers of Health Review SDOH reviewed no interventions necessary  Readmission risk has been reviewed Yes  Transition of care needs transition of care needs identified, TOC will continue to follow

## 2023-05-19 NOTE — Interval H&P Note (Signed)
 History and Physical Interval Note:  05/19/2023 8:13 AM  [name]  has presented today for surgery, with the diagnosis of obstruction.  The various methods of treatment have been discussed with the patient and family. After consideration of risks, benefits and other options for treatment, the patient has consented to  Procedure(s): EXPLORATORY LAPAROTOMY, LYSIS OF ADHESIONS (N/A) as a surgical intervention.  The patient's history has been reviewed, patient examined, no change in status, stable for surgery.  I have reviewed the patient's chart and labs.  Questions were answered to the patient's satisfaction.     Chevis Pretty III

## 2023-05-19 NOTE — Anesthesia Postprocedure Evaluation (Signed)
 Anesthesia Post Note  Patient: [name]  Procedure(s) Performed: EXPLORATORY LAPAROTOMY, LYSIS OF ADHESIONS     Patient location during evaluation: PACU Anesthesia Type: General Level of consciousness: sedated and patient cooperative Pain management: pain level controlled Vital Signs Assessment: post-procedure vital signs reviewed and stable Respiratory status: spontaneous breathing Cardiovascular status: stable Anesthetic complications: no   No notable events documented.  Last Vitals:  Vitals:   05/19/23 1130 05/19/23 1206  BP: (!) 184/87 (!) 176/72  Pulse: 95 97  Resp: 13 20  Temp:  36.8 C  SpO2: 95% 94%    Last Pain:  Vitals:   05/19/23 1206  TempSrc: Oral  PainSc:                  Lewie Loron

## 2023-05-19 NOTE — Anesthesia Procedure Notes (Addendum)
 Procedure Name: Awake intubation Date/Time: 05/19/2023 9:24 AM  Performed by: Floydene Flock, CRNAPre-anesthesia Checklist: Patient identified, Emergency Drugs available, Suction available and Patient being monitored Patient Re-evaluated:Patient Re-evaluated prior to induction Oxygen Delivery Method: Circle system utilized Preoxygenation: Pre-oxygenation with 100% oxygen Induction Type: IV induction Laryngoscope Size: Mac and 3 Tube type: Oral Tube size: 7.5 mm Number of attempts: 1 Airway Equipment and Method: Stylet and Video-laryngoscopy Placement Confirmation: ETT inserted through vocal cords under direct vision, positive ETCO2 and breath sounds checked- equal and bilateral Secured at: 22 cm Tube secured with: Tape Dental Injury: Teeth and Oropharynx as per pre-operative assessment  Comments: Rapid sequence induction. Patient not ventilated, OG connected to separate suction and on, cords clear, atraumatic. Positive ETCO2, cricoid pressure throughout intubation until ETCO2 verified.

## 2023-05-19 NOTE — Plan of Care (Signed)

## 2023-05-19 NOTE — Transfer of Care (Signed)
 Immediate Anesthesia Transfer of Care Note  Patient: [name]  Procedure(s) Performed: EXPLORATORY LAPAROTOMY, LYSIS OF ADHESIONS  Patient Location: PACU  Anesthesia Type:General  Level of Consciousness: drowsy  Airway & Oxygen Therapy: Patient Spontanous Breathing and Patient connected to face mask oxygen  Post-op Assessment: Report given to RN and Post -op Vital signs reviewed and stable  Post vital signs: Reviewed and stable  Last Vitals:  Vitals Value Taken Time  BP 166/76 05/19/23 1036  Temp    Pulse 93 05/19/23 1040  Resp 13 05/19/23 1040  SpO2 95 % 05/19/23 1040  Vitals shown include unfiled device data.  Last Pain:  Vitals:   05/19/23 0829  TempSrc:   PainSc: 5       Patients Stated Pain Goal: 0 (05/19/23 0829)  Complications: No notable events documented.

## 2023-05-19 NOTE — Plan of Care (Signed)
 Problem: Education: Goal: Ability to describe self-care measures that may prevent or decrease complications (Diabetes Survival Skills Education) will improve 05/19/2023 0416 by Gaynelle Arabian, RN Outcome: Progressing 05/19/2023 0413 by Gaynelle Arabian, RN Outcome: Progressing Goal: Individualized Educational Video(s) 05/19/2023 0416 by Gaynelle Arabian, RN Outcome: Progressing 05/19/2023 0413 by Gaynelle Arabian, RN Outcome: Progressing   Problem: Coping: Goal: Ability to adjust to condition or change in health will improve 05/19/2023 0416 by Gaynelle Arabian, RN Outcome: Progressing 05/19/2023 0413 by Gaynelle Arabian, RN Outcome: Progressing   Problem: Fluid Volume: Goal: Ability to maintain a balanced intake and output will improve 05/19/2023 0416 by Gaynelle Arabian, RN Outcome: Progressing 05/19/2023 0413 by Gaynelle Arabian, RN Outcome: Progressing   Problem: Health Behavior/Discharge Planning: Goal: Ability to identify and utilize available resources and services will improve 05/19/2023 0416 by Gaynelle Arabian, RN Outcome: Progressing 05/19/2023 0413 by Gaynelle Arabian, RN Outcome: Progressing Goal: Ability to manage health-related needs will improve 05/19/2023 0416 by Gaynelle Arabian, RN Outcome: Progressing 05/19/2023 0413 by Gaynelle Arabian, RN Outcome: Progressing   Problem: Metabolic: Goal: Ability to maintain appropriate glucose levels will improve 05/19/2023 0416 by Gaynelle Arabian, RN Outcome: Progressing 05/19/2023 0413 by Gaynelle Arabian, RN Outcome: Progressing   Problem: Nutritional: Goal: Maintenance of adequate nutrition will improve 05/19/2023 0416 by Gaynelle Arabian, RN Outcome: Progressing 05/19/2023 0413 by Gaynelle Arabian, RN Outcome: Progressing Goal: Progress toward achieving an optimal weight will improve 05/19/2023 0416 by Gaynelle Arabian, RN Outcome: Progressing 05/19/2023 0413 by Gaynelle Arabian,  RN Outcome: Progressing   Problem: Skin Integrity: Goal: Risk for impaired skin integrity will decrease 05/19/2023 0416 by Gaynelle Arabian, RN Outcome: Progressing 05/19/2023 0413 by Gaynelle Arabian, RN Outcome: Progressing   Problem: Tissue Perfusion: Goal: Adequacy of tissue perfusion will improve 05/19/2023 0416 by Gaynelle Arabian, RN Outcome: Progressing 05/19/2023 0413 by Gaynelle Arabian, RN Outcome: Progressing   Problem: Education: Goal: Knowledge of General Education information will improve Description: Including pain rating scale, medication(s)/side effects and non-pharmacologic comfort measures 05/19/2023 0416 by Gaynelle Arabian, RN Outcome: Progressing 05/19/2023 0413 by Gaynelle Arabian, RN Outcome: Progressing   Problem: Health Behavior/Discharge Planning: Goal: Ability to manage health-related needs will improve 05/19/2023 0416 by Gaynelle Arabian, RN Outcome: Progressing 05/19/2023 0413 by Gaynelle Arabian, RN Outcome: Progressing   Problem: Clinical Measurements: Goal: Ability to maintain clinical measurements within normal limits will improve 05/19/2023 0416 by Gaynelle Arabian, RN Outcome: Progressing 05/19/2023 0413 by Gaynelle Arabian, RN Outcome: Progressing Goal: Will remain free from infection 05/19/2023 0416 by Gaynelle Arabian, RN Outcome: Progressing 05/19/2023 0413 by Gaynelle Arabian, RN Outcome: Progressing Goal: Diagnostic test results will improve 05/19/2023 0416 by Gaynelle Arabian, RN Outcome: Progressing 05/19/2023 0413 by Gaynelle Arabian, RN Outcome: Progressing Goal: Respiratory complications will improve 05/19/2023 0416 by Gaynelle Arabian, RN Outcome: Progressing 05/19/2023 0413 by Gaynelle Arabian, RN Outcome: Progressing Goal: Cardiovascular complication will be avoided 05/19/2023 0416 by Gaynelle Arabian, RN Outcome: Progressing 05/19/2023 0413 by Gaynelle Arabian, RN Outcome: Progressing    Problem: Activity: Goal: Risk for activity intolerance will decrease 05/19/2023 0416 by Gaynelle Arabian, RN Outcome: Progressing 05/19/2023 0413 by Gaynelle Arabian, RN Outcome: Progressing   Problem: Nutrition: Goal: Adequate nutrition will be maintained 05/19/2023 0416 by Gaynelle Arabian, RN Outcome: Progressing 05/19/2023 0413 by Gaynelle Arabian, RN Outcome: Progressing  Problem: Coping: Goal: Level of anxiety will decrease 05/19/2023 0416 by Gaynelle Arabian, RN Outcome: Progressing 05/19/2023 0413 by Gaynelle Arabian, RN Outcome: Progressing   Problem: Elimination: Goal: Will not experience complications related to bowel motility 05/19/2023 0416 by Gaynelle Arabian, RN Outcome: Progressing 05/19/2023 0413 by Gaynelle Arabian, RN Outcome: Progressing Goal: Will not experience complications related to urinary retention 05/19/2023 0416 by Gaynelle Arabian, RN Outcome: Progressing 05/19/2023 0413 by Gaynelle Arabian, RN Outcome: Progressing   Problem: Pain Managment: Goal: General experience of comfort will improve and/or be controlled 05/19/2023 0416 by Gaynelle Arabian, RN Outcome: Progressing 05/19/2023 0413 by Gaynelle Arabian, RN Outcome: Progressing   Problem: Safety: Goal: Ability to remain free from injury will improve 05/19/2023 0416 by Gaynelle Arabian, RN Outcome: Progressing 05/19/2023 0413 by Gaynelle Arabian, RN Outcome: Progressing   Problem: Skin Integrity: Goal: Risk for impaired skin integrity will decrease 05/19/2023 0416 by Gaynelle Arabian, RN Outcome: Progressing 05/19/2023 0413 by Gaynelle Arabian, RN Outcome: Progressing

## 2023-05-19 NOTE — Progress Notes (Signed)
 PROGRESS NOTE    BRECKLYNN JIAN  WUJ:[id number] DOB: May 04, 1955 DOA: 05/14/2023 PCP: Tally Joe, MD   Brief Narrative:  [name] is a 68 y.o. female with medical history significant for obesity, type 2 diabetes on Mounjaro, diverticulosis, who presents with sudden onset abdominal pain.  CT abdomen pelvis concerning for obstructive distal small bowel obstruction.  Hospitalist called for admission and general surgery called in consult.  Assessment & Plan:   Principal Problem:   SBO (small bowel obstruction) (HCC)  SBO S/p ex lap with lysis of adhesions on 2/26 by Dr Chevis Pretty Hx of multiple prior abdominal surgeries General Surgery following NG tube to intermittent suction, continue postop Further management per surgery   Sepsis secondary to UTI, POA, resolved WBC 13.5, tachycardia noted on admission Antibiotics completed   Type 2 diabetes mellitus Hemoglobin 7.6 PTA Mounjaro, discussed discontinuation given above obstruction   Obesity Body mass index is 32.87 kg/m. Recommend weight loss outpatient with regular physical activity and healthy dieting. Would discontinue Mounjaro at discharge, follow-up with PCP for further evaluation and treatment  DVT prophylaxis: enoxaparin (LOVENOX) injection 40 mg Start: 05/15/23 1000   Code Status:   Code Status: Full Code  Family Communication: None present  Status is: Inpatient  Dispo: The patient is from: Home              Anticipated d/c is to: Home              Anticipated d/c date is: 24 to 48 hours              Patient currently not medically stable for discharge  Consultants:  General surgery  Procedures:  Exploratory lap  Antimicrobials:  Ceftriaxone x 3 days  Subjective: Saw patient postop, sleepy, reports pain.  Husband at bedside.    Objective: Vitals:   05/19/23 1130 05/19/23 1206 05/19/23 1354 05/19/23 1409  BP: (!) 184/87 (!) 176/72  (!) 167/76  Pulse: 95 97  98  Resp: 13 20 18 20   Temp:  98.2  F (36.8 C)  97.6 F (36.4 C)  TempSrc:  Oral  Oral  SpO2: 95% 94%  (!) 77%  Weight:      Height:        Intake/Output Summary (Last 24 hours) at 05/19/2023 1606 Last data filed at 05/19/2023 1040 Gross per 24 hour  Intake 1500 ml  Output 260 ml  Net 1240 ml   Filed Weights   05/14/23 2038 05/15/23 0913 05/19/23 0829  Weight: 89.4 kg 95.2 kg 95.2 kg    Examination: General: NAD, sleepy Cardiovascular: S1, S2 present Respiratory: CTAB Abdomen: Soft, tender, nondistended, incision C/D/I Musculoskeletal: No bilateral pedal edema noted Skin: Normal Psychiatry: Fair mood   Data Reviewed: I have personally reviewed following labs and imaging studies  CBC: Recent Labs  Lab 05/14/23 2101 05/15/23 0111 05/16/23 0640 05/18/23 0550  WBC 13.5* 12.2* 8.6 8.8  HGB 15.9* 14.6 13.1 13.4  HCT 46.5* 44.0 41.6 41.4  MCV 90.6 92.6 95.6 95.6  PLT 322 271 225 204   Basic Metabolic Panel: Recent Labs  Lab 05/14/23 2101 05/15/23 0111 05/15/23 0237 05/16/23 0640 05/17/23 0927 05/18/23 0550  NA 136  --  138 141 139 140  K 3.9  --  4.2 4.4 3.7 3.6  CL 101  --  102 100 102 102  CO2 21*  --  24 32 22 23  GLUCOSE 212*  --  145* 116* 85 112*  BUN 17  --  18 26* 22 17  CREATININE 0.84 0.78 0.82 0.84 0.53 0.56  CALCIUM 10.2  --  9.6 9.2 9.1 9.2  MG  --   --  2.3  --  2.0  --   PHOS  --   --  5.1*  --   --   --    GFR: Estimated Creatinine Clearance: 80.8 mL/min (by C-G formula based on SCr of 0.56 mg/dL).  Liver Function Tests: Recent Labs  Lab 05/14/23 2101  AST 33  ALT 35  ALKPHOS 104  BILITOT 0.9  PROT 8.6*  ALBUMIN 4.6    Recent Labs  Lab 05/14/23 2101  LIPASE 22   HbA1C: No results for input(s): "HGBA1C" in the last 72 hours.  CBG: Recent Labs  Lab 05/19/23 0007 05/19/23 0419 05/19/23 0740 05/19/23 1047 05/19/23 1549  GLUCAP 120* 112* 120* 97 142*   Recent Results (from the past 240 hours)  Blood culture (routine x 2)     Status: None (Preliminary  result)   Collection Time: 05/14/23 12:00 AM   Specimen: BLOOD  Result Value Ref Range Status   Specimen Description   Final    BLOOD RIGHT ANTECUBITAL Performed at [facility], [address], [place], [place] [address]    Special Requests   Final    BOTTLES DRAWN AEROBIC AND ANAEROBIC Blood Culture adequate volume Performed at [facility], [address], [place], [place] [address]    Culture   Final    NO GROWTH 4 DAYS Performed at [facility], [address], [place], [place] [address]    Report Status PENDING  Incomplete  Urine Culture     Status: Abnormal   Collection Time: 05/14/23  9:01 PM   Specimen: Urine, Random  Result Value Ref Range Status   Specimen Description   Final    URINE, RANDOM Performed at [facility], [address] [place], [place] [address]    Special Requests   Final    NONE Performed at [facility], [address], [place], [place] [address]    Culture (A)  Final    >=100,000 COLONIES/mL ESCHERICHIA COLI 40,000 COLONIES/mL AEROCOCCUS URINAE Standardized susceptibility testing for this organism is not available. Performed at [facility], [address], [place], [place] [address]    Report Status 05/18/2023 FINAL  Final   Organism ID, Bacteria ESCHERICHIA COLI (A)  Final      Susceptibility   Escherichia coli - MIC*    AMPICILLIN >=32 RESISTANT Resistant     CEFAZOLIN <=4 SENSITIVE Sensitive     CEFEPIME <=0.12 SENSITIVE Sensitive     CEFTRIAXONE <=0.25 SENSITIVE Sensitive     CIPROFLOXACIN <=0.25 SENSITIVE Sensitive     GENTAMICIN <=1 SENSITIVE Sensitive     IMIPENEM <=0.25 SENSITIVE Sensitive     NITROFURANTOIN <=16 SENSITIVE Sensitive     TRIMETH/SULFA <=20 SENSITIVE Sensitive     AMPICILLIN/SULBACTAM 16 INTERMEDIATE Intermediate     PIP/TAZO <=4 SENSITIVE Sensitive ug/mL    * >=100,000 COLONIES/mL ESCHERICHIA COLI  Blood culture (routine x  2)     Status: None (Preliminary result)   Collection Time: 05/14/23 11:58 PM   Specimen: BLOOD  Result Value Ref Range Status   Specimen Description   Final    BLOOD LEFT ANTECUBITAL Performed at [facility], [address] [place], [place] [address]    Special Requests   Final    BOTTLES DRAWN  AEROBIC AND ANAEROBIC Blood Culture results may not be optimal due to an inadequate volume of blood received in culture bottles Performed at [facility], [address], [place], [place] [address]    Culture   Final    NO GROWTH 4 DAYS Performed at [facility], [address], [place], [place] [address]    Report Status PENDING  Incomplete  Urine Culture     Status: Abnormal   Collection Time: 05/15/23  1:41 PM   Specimen: Urine, Random  Result Value Ref Range Status   Specimen Description   Final    URINE, RANDOM Performed at [facility], [address] [place], [place] [address]    Special Requests URINE, CLEAN CATCH  Final   Culture (A)  Final    <10,000 COLONIES/mL INSIGNIFICANT GROWTH Performed at [facility], [address] [place], [place] [address]    Report Status 05/16/2023 FINAL  Final  Surgical pcr screen     Status: Abnormal   Collection Time: 05/19/23  7:04 AM   Specimen: Nasal Mucosa; Nasal Swab  Result Value Ref Range Status   MRSA, PCR NEGATIVE NEGATIVE Final   Staphylococcus aureus POSITIVE (A) NEGATIVE Final    Comment: (NOTE) The Xpert SA Assay (FDA approved for NASAL specimens in patients 25 years of age and older), is one component of a comprehensive surveillance program. It is not intended to diagnose infection nor to guide or monitor treatment. Performed at [facility], [address] [place], [place] [address]      Radiology Studies: DG Abd Portable 1V Result Date: 05/18/2023 CLINICAL DATA:  Small-bowel obstruction EXAM: PORTABLE ABDOMEN - 1 VIEW  COMPARISON:  05/17/2023 FINDINGS: Enteric tube remains positioned within the stomach. Multiple mildly dilated loops of small bowel measuring up to 3.9 cm in diameter, unchanged from prior. Enteric contrast is present throughout the colon and rectum. No gross free intraperitoneal air. IMPRESSION: Unchanged mildly dilated loops of small bowel. Partial or intermittent obstruction remains a concern. Electronically Signed   By: Duanne Guess D.O.   On: 05/18/2023 12:04   DG Abd Portable 1V-Small Bowel Obstruction Protocol-initial, 8 hr delay Result Date: 05/17/2023 CLINICAL DATA:  Small-bowel obstruction, 8 hour delay imaging EXAM: PORTABLE ABDOMEN - 1 VIEW COMPARISON:  05/17/2023 FINDINGS: Supine frontal view of the abdomen and pelvis was obtained 8 hours after oral contrast administration. Stable enteric catheter overlying the gastric body. There is continued gaseous distention of the small bowel measuring up to 3.9 cm. Continued progression of oral contrast throughout the colon. No masses or abnormal calcifications. IMPRESSION: 1. Persistent gaseous distention of the small bowel, with continued progression of wall contrast throughout the colon. Findings may reflect resolving or intermittent small bowel obstruction. Electronically Signed   By: Sharlet Salina M.D.   On: 05/17/2023 22:07   Scheduled Meds:  Chlorhexidine Gluconate Cloth  6 each Topical Daily   enoxaparin (LOVENOX) injection  40 mg Subcutaneous Q24H   fentaNYL       fentaNYL   Intravenous Q4H   insulin aspart  0-9 Units Subcutaneous Q4H   methocarbamol (ROBAXIN) injection  500 mg Intravenous TID   mupirocin ointment  1 Application Nasal BID   Continuous Infusions:  acetaminophen 1,000 mg (05/19/23 1510)   dextrose 10 mL/hr at 05/18/23 1348   lactated ringers       LOS: 4 days   Time spent:  Briant Cedar, MD [facility]  If 7PM-7AM, please contact night-coverage [email or web address]  05/19/2023, 4:06 PM

## 2023-05-19 NOTE — Op Note (Signed)
 05/14/2023 - 05/19/2023  10:17 AM  PATIENT:  [name]  68 y.o. female  PRE-OPERATIVE DIAGNOSIS:  small bowel obstruction  POST-OPERATIVE DIAGNOSIS:  small bowel obstruction  PROCEDURE:  Procedure(s): EXPLORATORY LAPAROTOMY, LYSIS OF ADHESIONS (N/A)  SURGEON:  Surgeons and Role:    * Griselda Miner, MD - Primary  PHYSICIAN ASSISTANT:   ASSISTANTS: Bailey Mech, PA   ANESTHESIA:   general  EBL:  minimal   BLOOD ADMINISTERED:none  DRAINS: none   LOCAL MEDICATIONS USED:  NONE  SPECIMEN:  No Specimen  DISPOSITION OF SPECIMEN:  N/A  COUNTS:  YES  TOURNIQUET:  * No tourniquets in log *  DICTATION: .Dragon Dictation  After informed consent was obtained the patient was brought to the operating room and placed in the supine position on the operating table.  After adequate induction of general anesthesia the patient's abdomen was prepped with ChloraPrep, allowed to dry, and draped in usual sterile manner.  An appropriate timeout was performed.  A midline incision was then made with a 10 blade knife.  The incision was carried through the skin and subcutaneous tissue sharply with the electrocautery until the linea alba was identified.  The linea alba was incised with the electrocautery.  Each side was grasped with Kocher clamps and elevated anteriorly.  The preperitoneal space was probed bluntly with a hemostat until the peritoneum was opened and access was gained to the abdominal cavity.  The rest of the incision was then opened under direct vision.  There was some omentum that was adherent to the anterior abdominal wall and this was taken down sharply with the electrocautery.  Once this was accomplished then we were able to reflect the omentum and transverse colon up.  We ran the small bowel from the ligament of Treitz to the ileocecal valve.  In doing so there was a small filmy adhesion that we were able to lyse bluntly.  This was the definite site of obstruction.  At this point the  obstruction was relieved.  No other abnormalities were noted on general inspection of the abdomen and the bowel appeared to be healthy.  I did find 2 other adhesions of epiploic appendages that could have been potential sites of internal hernias that we lysed sharply with the LigaSure.  At this point the NG tube was confirmed in good position in the stomach.  The abdomen was irrigated with copious amounts of saline.  The fascia of the anterior abdominal wall was then closed with 2 running #1 double-stranded looped PDS sutures.  The subcutaneous tissue was irrigated with saline and the skin was closed with staples.  Sterile honeycomb dressings were applied.  The patient tolerated the procedure well.  At the end of the case all needle sponge and instrument counts were correct.  The patient was then awakened and taken recovery in stable condition.  PLAN OF CARE: Admit to inpatient   PATIENT DISPOSITION:  PACU - hemodynamically stable.   Delay start of Pharmacological VTE agent (>24hrs) due to surgical blood loss or risk of bleeding: no

## 2023-05-20 ENCOUNTER — Inpatient Hospital Stay (HOSPITAL_COMMUNITY): Payer: Medicare HMO

## 2023-05-20 ENCOUNTER — Encounter (HOSPITAL_COMMUNITY): Payer: Self-pay | Admitting: General Surgery

## 2023-05-20 DIAGNOSIS — M7989 Other specified soft tissue disorders: Secondary | ICD-10-CM

## 2023-05-20 DIAGNOSIS — K56609 Unspecified intestinal obstruction, unspecified as to partial versus complete obstruction: Secondary | ICD-10-CM | POA: Diagnosis not present

## 2023-05-20 LAB — GLUCOSE, CAPILLARY
Glucose-Capillary: 108 mg/dL — ABNORMAL HIGH (ref 70–99)
Glucose-Capillary: 111 mg/dL — ABNORMAL HIGH (ref 70–99)
Glucose-Capillary: 124 mg/dL — ABNORMAL HIGH (ref 70–99)
Glucose-Capillary: 162 mg/dL — ABNORMAL HIGH (ref 70–99)
Glucose-Capillary: 163 mg/dL — ABNORMAL HIGH (ref 70–99)
Glucose-Capillary: 177 mg/dL — ABNORMAL HIGH (ref 70–99)

## 2023-05-20 LAB — CULTURE, BLOOD (ROUTINE X 2)
Culture: NO GROWTH
Culture: NO GROWTH
Special Requests: ADEQUATE

## 2023-05-20 LAB — BASIC METABOLIC PANEL
Anion gap: 12 (ref 5–15)
BUN: 11 mg/dL (ref 8–23)
CO2: 20 mmol/L — ABNORMAL LOW (ref 22–32)
Calcium: 8.2 mg/dL — ABNORMAL LOW (ref 8.9–10.3)
Chloride: 101 mmol/L (ref 98–111)
Creatinine, Ser: 0.54 mg/dL (ref 0.44–1.00)
GFR, Estimated: >60 mL/min (ref >60)
Glucose, Bld: 118 mg/dL — ABNORMAL HIGH (ref 70–99)
Potassium: 3.7 mmol/L (ref 3.5–5.1)
Sodium: 133 mmol/L — ABNORMAL LOW (ref 135–145)

## 2023-05-20 LAB — CBC
HCT: 36.3 % (ref 36.0–46.0)
Hemoglobin: 12.1 g/dL (ref 12.0–15.0)
MCH: 31.1 pg (ref 26.0–34.0)
MCHC: 33.3 g/dL (ref 30.0–36.0)
MCV: 93.3 fL (ref 80.0–100.0)
Platelets: 198 10*3/uL (ref 150–400)
RBC: 3.89 MIL/uL (ref 3.87–5.11)
RDW: 13.1 % (ref 11.5–15.5)
WBC: 8.5 10*3/uL (ref 4.0–10.5)
nRBC: 0 % (ref 0.0–0.2)

## 2023-05-20 LAB — D-DIMER, QUANTITATIVE: D-Dimer, Quant: 3.44 ug{FEU}/mL — ABNORMAL HIGH (ref 0.00–0.50)

## 2023-05-20 MED ORDER — BOOST / RESOURCE BREEZE PO LIQD CUSTOM
1.0000 | Freq: Three times a day (TID) | ORAL | Status: DC
  Administered 2023-05-20 – 2023-05-21 (×2): 1 via ORAL

## 2023-05-20 NOTE — Progress Notes (Addendum)
    Patient Name: [name]           DOB: 1955/12/07  MRN: [id number]      Admission Date: 05/14/2023  Attending Provider: Briant Cedar, MD  Primary Diagnosis: SBO (small bowel obstruction) [facility])   Level of care: Med-Surg    CROSS COVER NOTE   Date of Service   05/20/2023   ROBYNNE ROAT, 68 y.o. female, was admitted on 05/14/2023 for SBO (small bowel obstruction) (HCC).    HPI/Events of Note   Patient complaining of left arm swelling and redness.  These changes were noticed on the night of 2/25. Patient has history of left arm DVT (5-10 years ago?).  Lovenox was held for 2 days due to uncertainty of when surgery would be done?  Bedside Assessment:  Patient is awake, A/O x4, with no associated distress.  Patient is reporting worsening swelling and redness to left hand that is now extending down to her elbow. Denies pain. Extremities are equally warm.  Pulse intact.  Capillary refill intact.  Respiratory: Bilaterally clear. Normal effort. No accessory muscle use.  Cardiovascular: Regular rate and rhythm. 2+ pedal pulses.     Interventions/ Plan   Elevate left upper extremity D-dimer Venous ultrasound        Anthoney Harada, DNP, ACNPC- AG [facility] [place]

## 2023-05-20 NOTE — Progress Notes (Signed)
 1 Day Post-Op   Subjective/Chief Complaint: No complaints. Feels better   Objective: Vital signs in last 24 hours: Temp:  [97.2 F (36.2 C)-98.7 F (37.1 C)] 98.4 F (36.9 C) (02/27 0357) Pulse Rate:  [91-98] 92 (02/27 0357) Resp:  [13-20] 18 (02/27 0743) BP: (163-187)/(69-88) 163/69 (02/27 0357) SpO2:  [77 %-98 %] 90 % (02/27 0357) FiO2 (%):  [0 %] 0 % (02/26 1816) Weight:  [95.2 kg] 95.2 kg (02/26 0829) Last BM Date :  (None)  Intake/Output from previous day: 02/26 0701 - 02/27 0700 In: 1500 [I.V.:1500] Out: 2860 [Urine:2250; Emesis/NG output:500; Blood:10] Intake/Output this shift: No intake/output data recorded.  General appearance: alert and cooperative Resp: clear to auscultation bilaterally Cardio: regular rate and rhythm GI: soft, moderate tenderness. Incision ok. Good bs  Lab Results:  Recent Labs    05/18/23 0550 05/20/23 0749  WBC 8.8 8.5  HGB 13.4 12.1  HCT 41.4 36.3  PLT 204 198   BMET Recent Labs    05/18/23 0550 05/20/23 0618  NA 140 133*  K 3.6 3.7  CL 102 101  CO2 23 20*  GLUCOSE 112* 118*  BUN 17 11  CREATININE 0.56 0.54  CALCIUM 9.2 8.2*   PT/INR No results for input(s): "LABPROT", "INR" in the last 72 hours. ABG No results for input(s): "PHART", "HCO3" in the last 72 hours.  Invalid input(s): "PCO2", "PO2"  Studies/Results: DG Abd Portable 1V Result Date: 05/18/2023 CLINICAL DATA:  Small-bowel obstruction EXAM: PORTABLE ABDOMEN - 1 VIEW COMPARISON:  05/17/2023 FINDINGS: Enteric tube remains positioned within the stomach. Multiple mildly dilated loops of small bowel measuring up to 3.9 cm in diameter, unchanged from prior. Enteric contrast is present throughout the colon and rectum. No gross free intraperitoneal air. IMPRESSION: Unchanged mildly dilated loops of small bowel. Partial or intermittent obstruction remains a concern. Electronically Signed   By: Duanne Guess D.O.   On: 05/18/2023 12:04     Anti-infectives: Anti-infectives (From admission, onward)    Start     Dose/Rate Route Frequency Ordered Stop   05/19/23 0945  cefTRIAXone (ROCEPHIN) 2 g in sodium chloride 0.9 % 100 mL IVPB  Status:  Discontinued        2 g 200 mL/hr over 30 Minutes Intravenous  Once 05/19/23 9811 05/19/23 1149   05/15/23 2200  cefTRIAXone (ROCEPHIN) 1 g in sodium chloride 0.9 % 100 mL IVPB  Status:  Discontinued        1 g 200 mL/hr over 30 Minutes Intravenous Every 24 hours 05/15/23 0628 05/18/23 1519   05/14/23 2345  cefTRIAXone (ROCEPHIN) 1 g in sodium chloride 0.9 % 100 mL IVPB        1 g 200 mL/hr over 30 Minutes Intravenous  Once 05/14/23 2344 05/15/23 0102       Assessment/Plan: s/p Procedure(s): EXPLORATORY LAPAROTOMY, LYSIS OF ADHESIONS (N/A) Advance diet. Allow clears D/c ng Ambulate FEN:  clears, LR@100cc /h VTE: LMWH ID: [email or web address] for UTI    - per TRH -  UTI Uncontrolled T2DM HTN HLD Hx of breast cancer s/p lumpectomy and chemo/rad  LOS: 5 days    [name] 05/20/2023

## 2023-05-20 NOTE — Progress Notes (Signed)
 Upper extremity venous duplex completed. Please see CV Procedures for preliminary results.  Initial findings reported to Jilda Panda, Charity fundraiser.  Shona Simpson, RVT 05/20/23 1:01 PM

## 2023-05-20 NOTE — Progress Notes (Signed)
 PROGRESS NOTE    [name]  ZOX:[id number] DOB: 23-Aug-1955 DOA: 05/14/2023 PCP: Tally Joe, MD   Brief Narrative:  [name] is a 68 y.o. female with medical history significant for obesity, type 2 diabetes on Mounjaro, diverticulosis, who presents with sudden onset abdominal pain.  CT abdomen pelvis concerning for obstructive distal small bowel obstruction.  Hospitalist called for admission and general surgery called in consult.  Assessment & Plan:   Principal Problem:   SBO (small bowel obstruction) (HCC)  SBO S/p ex lap with lysis of adhesions on 2/26 by Dr Chevis Pretty Hx of multiple prior abdominal surgeries General Surgery following Removed NG tube on 2/27, advance to CLD Ambulate in hallway, IS Further management per surgery  RUE SVT Doppler showed SVT in R cephalic vein Elevate RUE, pain management prn   Sepsis secondary to UTI, POA, resolved WBC 13.5, tachycardia noted on admission Antibiotics completed   Type 2 diabetes mellitus Hemoglobin 7.6 PTA Mounjaro, discussed discontinuation given above obstruction   Obesity Body mass index is 32.87 kg/m. Recommend weight loss outpatient with regular physical activity and healthy dieting. Would discontinue Mounjaro at discharge, follow-up with PCP for further evaluation and treatment  DVT prophylaxis: enoxaparin (LOVENOX) injection 40 mg Start: 05/15/23 1000   Code Status:   Code Status: Full Code  Family Communication: None present  Status is: Inpatient  Dispo: The patient is from: Home              Anticipated d/c is to: Home              Anticipated d/c date is: TBD              Patient currently not medically stable for discharge  Consultants:  General surgery  Procedures:  Exploratory lap  Antimicrobials:  Ceftriaxone x 3 days  Subjective: Denies any new complaints, tolerating CLD, feels like she wants to pass gas. Husband at bedside    Objective: Vitals:   05/20/23 0444 05/20/23 0743  05/20/23 1209 05/20/23 1459  BP:    (!) 185/72  Pulse:    91  Resp: 16 18 16 17   Temp:    98.2 F (36.8 C)  TempSrc:    Oral  SpO2:    (!) 89%  Weight:      Height:        Intake/Output Summary (Last 24 hours) at 05/20/2023 1540 Last data filed at 05/20/2023 0406 Gross per 24 hour  Intake --  Output 2300 ml  Net -2300 ml   Filed Weights   05/14/23 2038 05/15/23 0913 05/19/23 0829  Weight: 89.4 kg 95.2 kg 95.2 kg    Examination: General: NAD Cardiovascular: S1, S2 present Respiratory: CTAB Abdomen: Soft, tender, nondistended, incision with honeycomb dressing noted with some dry blood Musculoskeletal: No bilateral pedal edema noted Skin: Normal Psychiatry: Normal mood   Data Reviewed: I have personally reviewed following labs and imaging studies  CBC: Recent Labs  Lab 05/14/23 2101 05/15/23 0111 05/16/23 0640 05/18/23 0550 05/20/23 0749  WBC 13.5* 12.2* 8.6 8.8 8.5  HGB 15.9* 14.6 13.1 13.4 12.1  HCT 46.5* 44.0 41.6 41.4 36.3  MCV 90.6 92.6 95.6 95.6 93.3  PLT 322 271 225 204 198   Basic Metabolic Panel: Recent Labs  Lab 05/15/23 0237 05/16/23 0640 05/17/23 0927 05/18/23 0550 05/20/23 0618  NA 138 141 139 140 133*  K 4.2 4.4 3.7 3.6 3.7  CL 102 100 102 102 101  CO2 24 32 22  23 20*  GLUCOSE 145* 116* 85 112* 118*  BUN 18 26* 22 17 11   CREATININE 0.82 0.84 0.53 0.56 0.54  CALCIUM 9.6 9.2 9.1 9.2 8.2*  MG 2.3  --  2.0  --   --   PHOS 5.1*  --   --   --   --    GFR: Estimated Creatinine Clearance: 80.8 mL/min (by C-G formula based on SCr of 0.54 mg/dL).  Liver Function Tests: Recent Labs  Lab 05/14/23 2101  AST 33  ALT 35  ALKPHOS 104  BILITOT 0.9  PROT 8.6*  ALBUMIN 4.6    Recent Labs  Lab 05/14/23 2101  LIPASE 22   HbA1C: No results for input(s): "HGBA1C" in the last 72 hours.  CBG: Recent Labs  Lab 05/19/23 2052 05/20/23 0003 05/20/23 0359 05/20/23 0753 05/20/23 1146  GLUCAP 121* 108* 111* 124* 162*   Recent Results (from  the past 240 hours)  Blood culture (routine x 2)     Status: None   Collection Time: 05/14/23 12:00 AM   Specimen: BLOOD  Result Value Ref Range Status   Specimen Description   Final    BLOOD RIGHT ANTECUBITAL Performed at [facility], [address] [place], [place] [address]    Special Requests   Final    BOTTLES DRAWN AEROBIC AND ANAEROBIC Blood Culture adequate volume Performed at [facility], [address] [place], [place] [address]    Culture   Final    NO GROWTH 5 DAYS Performed at [facility], [address] [place], [place] [address]    Report Status 05/20/2023 FINAL  Final  Urine Culture     Status: Abnormal   Collection Time: 05/14/23  9:01 PM   Specimen: Urine, Random  Result Value Ref Range Status   Specimen Description   Final    URINE, RANDOM Performed at [facility], [address], [place], [place] [address]    Special Requests   Final    NONE Performed at [facility], [address], [place], [place] [address]    Culture (A)  Final    >=100,000 COLONIES/mL ESCHERICHIA COLI 40,000 COLONIES/mL AEROCOCCUS URINAE Standardized susceptibility testing for this organism is not available. Performed at [facility], [address], [place], [place] [address]    Report Status 05/18/2023 FINAL  Final   Organism ID, Bacteria ESCHERICHIA COLI (A)  Final      Susceptibility   Escherichia coli - MIC*    AMPICILLIN >=32 RESISTANT Resistant     CEFAZOLIN <=4 SENSITIVE Sensitive     CEFEPIME <=0.12 SENSITIVE Sensitive     CEFTRIAXONE <=0.25 SENSITIVE Sensitive     CIPROFLOXACIN <=0.25 SENSITIVE Sensitive     GENTAMICIN <=1 SENSITIVE Sensitive     IMIPENEM <=0.25 SENSITIVE Sensitive     NITROFURANTOIN <=16 SENSITIVE Sensitive     TRIMETH/SULFA <=20 SENSITIVE Sensitive     AMPICILLIN/SULBACTAM 16 INTERMEDIATE Intermediate     PIP/TAZO <=4 SENSITIVE Sensitive ug/mL    *  >=100,000 COLONIES/mL ESCHERICHIA COLI  Blood culture (routine x 2)     Status: None   Collection Time: 05/14/23 11:58 PM   Specimen: BLOOD  Result Value Ref Range Status   Specimen Description   Final    BLOOD LEFT ANTECUBITAL Performed at [facility], [address] [place], [place] [address]    Special Requests   Final    BOTTLES DRAWN AEROBIC  AND ANAEROBIC Blood Culture results may not be optimal due to an inadequate volume of blood received in culture bottles Performed at [facility], [address] [place], [place] [address]    Culture   Final    NO GROWTH 5 DAYS Performed at [facility], [address], [place], [place] [address]    Report Status 05/20/2023 FINAL  Final  Urine Culture     Status: Abnormal   Collection Time: 05/15/23  1:41 PM   Specimen: Urine, Random  Result Value Ref Range Status   Specimen Description   Final    URINE, RANDOM Performed at [facility], [address], [place], [place] [address]    Special Requests URINE, CLEAN CATCH  Final   Culture (A)  Final    <10,000 COLONIES/mL INSIGNIFICANT GROWTH Performed at [facility], [address] [place], [place] [address]    Report Status 05/16/2023 FINAL  Final  Surgical pcr screen     Status: Abnormal   Collection Time: 05/19/23  7:04 AM   Specimen: Nasal Mucosa; Nasal Swab  Result Value Ref Range Status   MRSA, PCR NEGATIVE NEGATIVE Final   Staphylococcus aureus POSITIVE (A) NEGATIVE Final    Comment: (NOTE) The Xpert SA Assay (FDA approved for NASAL specimens in patients 93 years of age and older), is one component of a comprehensive surveillance program. It is not intended to diagnose infection nor to guide or monitor treatment. Performed at [facility], [address], [place], [place] [address]      Radiology Studies: VAS [place] UPPER EXTREMITY VENOUS DUPLEX Result Date: 05/20/2023 UPPER VENOUS  STUDY  Patient Name:  [name]  Date of Exam:   05/20/2023 Medical Rec #: [id number]     Accession #:    [id number] Date of Birth: January 04, 1956     Patient Gender: F Patient Age:   58 years Exam Location:  [facility] Procedure:      VAS [place] UPPER EXTREMITY VENOUS DUPLEX Referring Phys: ABIGAIL CHAVEZ --------------------------------------------------------------------------------  Indications: Swelling, and redness Risk Factors: Chemotherapy Cancer Breast Surgery Laparotomy 05/19/23 past pregnancy. Comparison Study: None. Performing Technologist: Shona Simpson  Examination Guidelines: A complete evaluation includes B-mode imaging, spectral Doppler, color Doppler, and power Doppler as needed of all accessible portions of each vessel. Bilateral testing is considered an integral part of a complete examination. Limited examinations for reoccurring indications may be performed as noted.  Right Findings: +----------+------------+---------+-----------+----------+-------+ RIGHT     CompressiblePhasicitySpontaneousPropertiesSummary +----------+------------+---------+-----------+----------+-------+ IJV           Full       Yes       Yes                      +----------+------------+---------+-----------+----------+-------+ Subclavian    Full       Yes       Yes                      +----------+------------+---------+-----------+----------+-------+ Axillary      Full       Yes       Yes                      +----------+------------+---------+-----------+----------+-------+ Brachial      Full       Yes       Yes                      +----------+------------+---------+-----------+----------+-------+  Radial        Full       Yes       Yes                      +----------+------------+---------+-----------+----------+-------+ Ulnar         Full       Yes       Yes                      +----------+------------+---------+-----------+----------+-------+ Cephalic      None                  No      dilated   Acute  +----------+------------+---------+-----------+----------+-------+ Basilic       Full                 Yes                      +----------+------------+---------+-----------+----------+-------+ Occlusive thrombosis seen in the cephalic vein proximal to IV line.  Left Findings: +----------+------------+---------+-----------+------------------+-------+ LEFT      CompressiblePhasicitySpontaneous    Properties    Summary +----------+------------+---------+-----------+------------------+-------+ IJV           Full       Yes       Yes                              +----------+------------+---------+-----------+------------------+-------+ Subclavian    Full       Yes       Yes                              +----------+------------+---------+-----------+------------------+-------+ Axillary      Full       Yes       Yes                              +----------+------------+---------+-----------+------------------+-------+ Brachial      Full       Yes       Yes                              +----------+------------+---------+-----------+------------------+-------+ Radial        Full       Yes       Yes                              +----------+------------+---------+-----------+------------------+-------+ Ulnar         Full       Yes       Yes                              +----------+------------+---------+-----------+------------------+-------+ Cephalic    Partial                Yes    brightly echogenic Acute  +----------+------------+---------+-----------+------------------+-------+ Basilic       Full                 Yes                              +----------+------------+---------+-----------+------------------+-------+  Partial thrombosis seen in the Cephalic vein in the proximal forearm.  Summary:  Right: No evidence of deep vein thrombosis in the upper extremity. Findings consistent with acute superficial vein  thrombosis involving the right cephalic vein.  Left: No evidence of deep vein thrombosis in the upper extremity. Findings consistent with acute superficial vein thrombosis involving the left cephalic vein.  *See table(s) above for measurements and observations.     Preliminary    Scheduled Meds:  Chlorhexidine Gluconate Cloth  6 each Topical Daily   enoxaparin (LOVENOX) injection  40 mg Subcutaneous Q24H   fentaNYL   Intravenous Q4H   insulin aspart  0-9 Units Subcutaneous Q4H   methocarbamol (ROBAXIN) injection  500 mg Intravenous TID   mupirocin ointment  1 Application Nasal BID   Continuous Infusions:  dextrose 10 mL/hr at 05/18/23 1348     LOS: 5 days     Briant Cedar, MD [facility]  If 7PM-7AM, please contact night-coverage [email or web address]  05/20/2023, 3:40 PM

## 2023-05-20 NOTE — Progress Notes (Signed)
 Mobility Specialist - Progress Note   05/20/23 1344  Mobility  Activity Ambulated with assistance in hallway  Level of Assistance Standby assist, set-up cues, supervision of patient - no hands on  Assistive Device Front wheel walker  Distance Ambulated (ft) 160 ft  Activity Response Tolerated well  Mobility Referral Yes  Mobility visit 1 Mobility  Mobility Specialist Start Time (ACUTE ONLY) 1322  Mobility Specialist Stop Time (ACUTE ONLY) 1344  Mobility Specialist Time Calculation (min) (ACUTE ONLY) 22 min   Pt received in bed and agreeable to mobility. Pt was minA from STS & SB during ambulation. No complaints during session. MinA getting back into bed. Pt to bed after session with all needs met.    [facility]

## 2023-05-21 DIAGNOSIS — K56609 Unspecified intestinal obstruction, unspecified as to partial versus complete obstruction: Secondary | ICD-10-CM | POA: Diagnosis not present

## 2023-05-21 LAB — GLUCOSE, CAPILLARY
Glucose-Capillary: 128 mg/dL — ABNORMAL HIGH (ref 70–99)
Glucose-Capillary: 166 mg/dL — ABNORMAL HIGH (ref 70–99)
Glucose-Capillary: 172 mg/dL — ABNORMAL HIGH (ref 70–99)
Glucose-Capillary: 172 mg/dL — ABNORMAL HIGH (ref 70–99)
Glucose-Capillary: 172 mg/dL — ABNORMAL HIGH (ref 70–99)
Glucose-Capillary: 242 mg/dL — ABNORMAL HIGH (ref 70–99)

## 2023-05-21 MED ORDER — SERTRALINE HCL 100 MG PO TABS
100.0000 mg | ORAL_TABLET | Freq: Every day | ORAL | Status: DC
  Administered 2023-05-21 – 2023-05-22 (×2): 100 mg via ORAL
  Filled 2023-05-21 (×2): qty 1

## 2023-05-21 MED ORDER — SERTRALINE HCL 50 MG PO TABS: 50.0000 mg | ORAL_TABLET | Freq: Every day | ORAL | Status: DC

## 2023-05-21 MED ORDER — LOSARTAN POTASSIUM 50 MG PO TABS
25.0000 mg | ORAL_TABLET | Freq: Every day | ORAL | Status: DC
  Administered 2023-05-21 – 2023-05-22 (×2): 25 mg via ORAL
  Filled 2023-05-21 (×2): qty 1

## 2023-05-21 MED ORDER — PANTOPRAZOLE SODIUM 40 MG PO TBEC
40.0000 mg | DELAYED_RELEASE_TABLET | Freq: Every day | ORAL | Status: DC
  Administered 2023-05-21 – 2023-05-22 (×2): 40 mg via ORAL
  Filled 2023-05-21 (×2): qty 1

## 2023-05-21 MED ORDER — HYDRALAZINE HCL 20 MG/ML IJ SOLN
10.0000 mg | Freq: Three times a day (TID) | INTRAMUSCULAR | Status: DC | PRN
Start: 2023-05-21 — End: 2023-05-22

## 2023-05-21 MED ORDER — METHOCARBAMOL 500 MG PO TABS
500.0000 mg | ORAL_TABLET | Freq: Three times a day (TID) | ORAL | Status: DC
  Administered 2023-05-21 – 2023-05-22 (×6): 500 mg via ORAL
  Filled 2023-05-21 (×6): qty 1

## 2023-05-21 MED ORDER — INSULIN ASPART 100 UNIT/ML IJ SOLN
0.0000 [IU] | Freq: Three times a day (TID) | INTRAMUSCULAR | Status: DC
  Administered 2023-05-21 – 2023-05-22 (×4): 3 [IU] via SUBCUTANEOUS

## 2023-05-21 MED ORDER — OXYCODONE HCL 5 MG PO TABS
5.0000 mg | ORAL_TABLET | ORAL | Status: DC | PRN
  Administered 2023-05-21 – 2023-05-22 (×2): 10 mg via ORAL
  Filled 2023-05-21 (×2): qty 2

## 2023-05-21 MED ORDER — HYDROCODONE-ACETAMINOPHEN 5-325 MG PO TABS
1.0000 | ORAL_TABLET | Freq: Four times a day (QID) | ORAL | Status: DC | PRN
  Administered 2023-05-21: 1 via ORAL
  Filled 2023-05-21: qty 1

## 2023-05-21 MED ORDER — ONDANSETRON HCL 4 MG/2ML IJ SOLN: 4.0000 mg | Freq: Three times a day (TID) | INTRAMUSCULAR | Status: DC | PRN

## 2023-05-21 MED ORDER — INSULIN ASPART 100 UNIT/ML IJ SOLN: 0.0000 [IU] | Freq: Every day | INTRAMUSCULAR | Status: DC

## 2023-05-21 NOTE — Progress Notes (Addendum)
 PROGRESS NOTE    [name]  ZOX:[id number] DOB: 1955-10-06 DOA: 05/14/2023 PCP: Tally Joe, MD   Brief Narrative:  [name] is a 68 y.o. female with medical history significant for obesity, type 2 diabetes on Mounjaro, diverticulosis, who presents with sudden onset abdominal pain.  CT abdomen pelvis concerning for obstructive distal small bowel obstruction.  Hospitalist called for admission and general surgery called in consult.  Assessment & Plan:   Principal Problem:   SBO (small bowel obstruction) (HCC)  SBO S/p ex lap with lysis of adhesions on 2/26 by Dr Chevis Pretty Hx of multiple prior abdominal surgeries General Surgery following Removed NG tube on 2/27, advanced to soft diet Ambulate in hallway, IS Further management per surgery  RUE SVT Doppler showed SVT in R cephalic vein Elevate RUE, pain management prn   Sepsis secondary to UTI, POA, resolved WBC 13.5, tachycardia noted on admission Antibiotics completed   Type 2 diabetes mellitus, uncontrolled with hyperglycemia Hemoglobin 7.6 SSI, Accu-Cheks, hypoglycemic protocol PTA Mounjaro, discussed discontinuation given above obstruction  Hypertension BP uncontrolled Resume home losartan   Obesity Body mass index is 32.87 kg/m. Recommend weight loss outpatient with regular physical activity and healthy dieting. Would discontinue Mounjaro at discharge, follow-up with PCP for further evaluation and treatment  DVT prophylaxis: enoxaparin (LOVENOX) injection 40 mg Start: 05/15/23 1000   Code Status:   Code Status: Full Code  Family Communication: None present  Status is: Inpatient  Dispo: The patient is from: Home              Anticipated d/c is to: Home              Anticipated d/c date is: TBD              Patient currently not medically stable for discharge  Consultants:  General surgery  Procedures:  Exploratory lap  Antimicrobials:  Ceftriaxone x 3 days  Subjective: Concerned about  her superficial venous thrombophlebitis, discussed in details about management and will not be requiring any anticoagulation, verbalized understanding.  Tolerating diet, having BMs.  Noted to be very hard to stick, met IV team at bedside.  Will limit IV use as much as possible and space out blood draws as well.    Objective: Vitals:   05/21/23 0453 05/21/23 0738 05/21/23 0844 05/21/23 1436  BP:    (!) 161/72  Pulse:    91  Resp: 17 17 16 17   Temp:    98.2 F (36.8 C)  TempSrc:    Oral  SpO2:  94%  96%  Weight:      Height:        Intake/Output Summary (Last 24 hours) at 05/21/2023 1519 Last data filed at 05/20/2023 1800 Gross per 24 hour  Intake --  Output 750 ml  Net -750 ml   Filed Weights   05/14/23 2038 05/15/23 0913 05/19/23 0829  Weight: 89.4 kg 95.2 kg 95.2 kg    Examination: General: NAD Cardiovascular: S1, S2 present Respiratory: CTAB Abdomen: Soft, tender, nondistended, incision with honeycomb dressing noted with some dry blood Musculoskeletal: No bilateral pedal edema noted Skin: Normal Psychiatry: Normal mood   Data Reviewed: I have personally reviewed following labs and imaging studies  CBC: Recent Labs  Lab 05/14/23 2101 05/15/23 0111 05/16/23 0640 05/18/23 0550 05/20/23 0749  WBC 13.5* 12.2* 8.6 8.8 8.5  HGB 15.9* 14.6 13.1 13.4 12.1  HCT 46.5* 44.0 41.6 41.4 36.3  MCV 90.6 92.6 95.6 95.6 93.3  PLT 322 271 225 204 198   Basic Metabolic Panel: Recent Labs  Lab 05/15/23 0237 05/16/23 0640 05/17/23 0927 05/18/23 0550 05/20/23 0618  NA 138 141 139 140 133*  K 4.2 4.4 3.7 3.6 3.7  CL 102 100 102 102 101  CO2 24 32 22 23 20*  GLUCOSE 145* 116* 85 112* 118*  BUN 18 26* 22 17 11   CREATININE 0.82 0.84 0.53 0.56 0.54  CALCIUM 9.6 9.2 9.1 9.2 8.2*  MG 2.3  --  2.0  --   --   PHOS 5.1*  --   --   --   --    GFR: Estimated Creatinine Clearance: 80.8 mL/min (by C-G formula based on SCr of 0.54 mg/dL).  Liver Function Tests: Recent Labs   Lab 05/14/23 2101  AST 33  ALT 35  ALKPHOS 104  BILITOT 0.9  PROT 8.6*  ALBUMIN 4.6    Recent Labs  Lab 05/14/23 2101  LIPASE 22   HbA1C: No results for input(s): "HGBA1C" in the last 72 hours.  CBG: Recent Labs  Lab 05/20/23 2046 05/21/23 0025 05/21/23 0427 05/21/23 0729 05/21/23 1158  GLUCAP 163* 172* 172* 128* 242*   Recent Results (from the past 240 hours)  Blood culture (routine x 2)     Status: None   Collection Time: 05/14/23 12:00 AM   Specimen: BLOOD  Result Value Ref Range Status   Specimen Description   Final    BLOOD RIGHT ANTECUBITAL Performed at [facility], [address], [place], [place] [address]    Special Requests   Final    BOTTLES DRAWN AEROBIC AND ANAEROBIC Blood Culture adequate volume Performed at [facility], [address], [place], [place] [address]    Culture   Final    NO GROWTH 5 DAYS Performed at [facility], [address] [place], [place] [address]    Report Status 05/20/2023 FINAL  Final  Urine Culture     Status: Abnormal   Collection Time: 05/14/23  9:01 PM   Specimen: Urine, Random  Result Value Ref Range Status   Specimen Description   Final    URINE, RANDOM Performed at [facility], [address], [place], [place] [address]    Special Requests   Final    NONE Performed at [facility], [address] [place], [place] [address]    Culture (A)  Final    >=100,000 COLONIES/mL ESCHERICHIA COLI 40,000 COLONIES/mL AEROCOCCUS URINAE Standardized susceptibility testing for this organism is not available. Performed at [facility], [address] [place], [place] [address]    Report Status 05/18/2023 FINAL  Final   Organism ID, Bacteria ESCHERICHIA COLI (A)  Final      Susceptibility   Escherichia coli - MIC*    AMPICILLIN >=32 RESISTANT Resistant     CEFAZOLIN <=4 SENSITIVE Sensitive     CEFEPIME <=0.12 SENSITIVE  Sensitive     CEFTRIAXONE <=0.25 SENSITIVE Sensitive     CIPROFLOXACIN <=0.25 SENSITIVE Sensitive     GENTAMICIN <=1 SENSITIVE Sensitive     IMIPENEM <=0.25 SENSITIVE Sensitive     NITROFURANTOIN <=16 SENSITIVE Sensitive     TRIMETH/SULFA <=20 SENSITIVE Sensitive     AMPICILLIN/SULBACTAM 16 INTERMEDIATE Intermediate     PIP/TAZO <=4 SENSITIVE Sensitive ug/mL    * >=100,000 COLONIES/mL ESCHERICHIA COLI  Blood culture (routine x 2)     Status: None   Collection Time: 05/14/23 11:58  PM   Specimen: BLOOD  Result Value Ref Range Status   Specimen Description   Final    BLOOD LEFT ANTECUBITAL Performed at [facility], [address] [place], [place] [address]    Special Requests   Final    BOTTLES DRAWN AEROBIC AND ANAEROBIC Blood Culture results may not be optimal due to an inadequate volume of blood received in culture bottles Performed at [facility], [address] [place], [place] [address]    Culture   Final    NO GROWTH 5 DAYS Performed at [facility], [address] [place], [place] [address]    Report Status 05/20/2023 FINAL  Final  Urine Culture     Status: Abnormal   Collection Time: 05/15/23  1:41 PM   Specimen: Urine, Random  Result Value Ref Range Status   Specimen Description   Final    URINE, RANDOM Performed at [facility], [address], [place], [place] [address]    Special Requests URINE, CLEAN CATCH  Final   Culture (A)  Final    <10,000 COLONIES/mL INSIGNIFICANT GROWTH Performed at [facility], [address] [place], [place] [address]    Report Status 05/16/2023 FINAL  Final  Surgical pcr screen     Status: Abnormal   Collection Time: 05/19/23  7:04 AM   Specimen: Nasal Mucosa; Nasal Swab  Result Value Ref Range Status   MRSA, PCR NEGATIVE NEGATIVE Final   Staphylococcus aureus POSITIVE (A) NEGATIVE Final    Comment: (NOTE) The Xpert SA Assay (FDA approved for NASAL  specimens in patients 48 years of age and older), is one component of a comprehensive surveillance program. It is not intended to diagnose infection nor to guide or monitor treatment. Performed at [facility], [address], [place], [place] [address]      Radiology Studies: VAS [place] UPPER EXTREMITY VENOUS DUPLEX Result Date: 05/21/2023 UPPER VENOUS STUDY  Patient Name:  NAJMO PARDUE  Date of Exam:   05/20/2023 Medical Rec #: [id number]     Accession #:    [id number] Date of Birth: Jul 29, 1955     Patient Gender: F Patient Age:   72 years Exam Location:  [facility] Procedure:      VAS [place] UPPER EXTREMITY VENOUS DUPLEX Referring Phys: ABIGAIL CHAVEZ --------------------------------------------------------------------------------  Indications: Swelling, and redness Risk Factors: Chemotherapy Cancer Breast Surgery Laparotomy 05/19/23 past pregnancy. Comparison Study: None. Performing Technologist: Shona Simpson  Examination Guidelines: A complete evaluation includes B-mode imaging, spectral Doppler, color Doppler, and power Doppler as needed of all accessible portions of each vessel. Bilateral testing is considered an integral part of a complete examination. Limited examinations for reoccurring indications may be performed as noted.  Right Findings: +----------+------------+---------+-----------+----------+-------+ RIGHT     CompressiblePhasicitySpontaneousPropertiesSummary +----------+------------+---------+-----------+----------+-------+ IJV           Full       Yes       Yes                      +----------+------------+---------+-----------+----------+-------+ Subclavian    Full       Yes       Yes                      +----------+------------+---------+-----------+----------+-------+ Axillary      Full       Yes       Yes                      +----------+------------+---------+-----------+----------+-------+  Brachial      Full       Yes       Yes                       +----------+------------+---------+-----------+----------+-------+ Radial        Full       Yes       Yes                      +----------+------------+---------+-----------+----------+-------+ Ulnar         Full       Yes       Yes                      +----------+------------+---------+-----------+----------+-------+ Cephalic      None                 No      dilated   Acute  +----------+------------+---------+-----------+----------+-------+ Basilic       Full                 Yes                      +----------+------------+---------+-----------+----------+-------+ Occlusive thrombosis seen in the cephalic vein proximal to IV line.  Left Findings: +----------+------------+---------+-----------+------------------+-------+ LEFT      CompressiblePhasicitySpontaneous    Properties    Summary +----------+------------+---------+-----------+------------------+-------+ IJV           Full       Yes       Yes                              +----------+------------+---------+-----------+------------------+-------+ Subclavian    Full       Yes       Yes                              +----------+------------+---------+-----------+------------------+-------+ Axillary      Full       Yes       Yes                              +----------+------------+---------+-----------+------------------+-------+ Brachial      Full       Yes       Yes                              +----------+------------+---------+-----------+------------------+-------+ Radial        Full       Yes       Yes                              +----------+------------+---------+-----------+------------------+-------+ Ulnar         Full       Yes       Yes                              +----------+------------+---------+-----------+------------------+-------+ Cephalic    Partial                Yes    brightly echogenic Acute   +----------+------------+---------+-----------+------------------+-------+ Basilic       Full  Yes                              +----------+------------+---------+-----------+------------------+-------+ Partial thrombosis seen in the Cephalic vein in the proximal forearm.  Summary:  Right: No evidence of deep vein thrombosis in the upper extremity. Findings consistent with acute superficial vein thrombosis involving the right cephalic vein.  Left: No evidence of deep vein thrombosis in the upper extremity. Findings consistent with acute superficial vein thrombosis involving the left cephalic vein.  *See table(s) above for measurements and observations.  Diagnosing physician: Carolynn Sayers Electronically signed by Carolynn Sayers on 05/21/2023 at 7:15:47 AM.    Final    Scheduled Meds:  Chlorhexidine Gluconate Cloth  6 each Topical Daily   enoxaparin (LOVENOX) injection  40 mg Subcutaneous Q24H   feeding supplement  1 Container Oral TID BM   insulin aspart  0-9 Units Subcutaneous Q4H   losartan  25 mg Oral Daily   methocarbamol  500 mg Oral TID   mupirocin ointment  1 Application Nasal BID   pantoprazole  40 mg Oral Daily   sertraline  100 mg Oral Daily   Continuous Infusions:     LOS: 6 days     Briant Cedar, MD [facility]  If 7PM-7AM, please contact night-coverage [email or web address]  05/21/2023, 3:19 PM

## 2023-05-21 NOTE — Plan of Care (Signed)

## 2023-05-21 NOTE — Progress Notes (Signed)
 VAST consulted to obtain IV access. Pt with infiltration and swelling to right forearm. Upper right arm assessed utilizing ultrasound. Cephalic vein non-compressible; brachial and basilic veins too deep for USGIV in right arm. Left arm initially with arm restriction band in place. Patient explained she previously had issues with an IV in that arm causing problems (nerve pain and clot). She further verbalized a few days ago she had redness and swelling in that lower arm and hand; minimal swelling still present in hand, but no further pain or redness noted. Left arm assessed utilizing ultrasound; no appropriate vessels in lower left arm. Only vessel appropriate for IV placement in upper left arm. Aaron Edelman, MD came in to bedside while VAST RN assessing vasculature; informed MD of poor vasculature. VAST RN requested IV be left out at this time due to poor vasculature and pt's agreement to switch to PO pain meds. Attending MD insisted IV be placed just in case of emergency and suggested it not be used except for emergency. IV placed successfully in upper left arm utilizing ultrasound.

## 2023-05-21 NOTE — Progress Notes (Signed)
 2 Days Post-Op   Subjective/Chief Complaint: No complaints other than soreness. Having bm's   Objective: Vital signs in last 24 hours: Temp:  [98.2 F (36.8 C)-99.1 F (37.3 C)] 98.2 F (36.8 C) (02/28 0430) Pulse Rate:  [91-92] 92 (02/28 0430) Resp:  [16-19] 16 (02/28 0844) BP: (177-185)/(72-83) 177/83 (02/28 0430) SpO2:  [89 %-94 %] 94 % (02/28 0738) Last BM Date : 05/20/23  Intake/Output from previous day: 02/27 0701 - 02/28 0700 In: -  Out: 750 [Urine:750] Intake/Output this shift: No intake/output data recorded.  General appearance: alert and cooperative Resp: clear to auscultation bilaterally Cardio: regular rate and rhythm GI: soft, moderate tenderness. Good bs. Incision ok  Lab Results:  Recent Labs    05/20/23 0749  WBC 8.5  HGB 12.1  HCT 36.3  PLT 198   BMET Recent Labs    05/20/23 0618  NA 133*  K 3.7  CL 101  CO2 20*  GLUCOSE 118*  BUN 11  CREATININE 0.54  CALCIUM 8.2*   PT/INR No results for input(s): "LABPROT", "INR" in the last 72 hours. ABG No results for input(s): "PHART", "HCO3" in the last 72 hours.  Invalid input(s): "PCO2", "PO2"  Studies/Results: VAS [place] UPPER EXTREMITY VENOUS DUPLEX Result Date: 05/21/2023 UPPER VENOUS STUDY  Patient Name:  TWYLAH BENNETTS  Date of Exam:   05/20/2023 Medical Rec #: [id number]     Accession #:    [id number] Date of Birth: 1956/03/10     Patient Gender: F Patient Age:   68 years Exam Location:  [facility] Procedure:      VAS [place] UPPER EXTREMITY VENOUS DUPLEX Referring Phys: ABIGAIL CHAVEZ --------------------------------------------------------------------------------  Indications: Swelling, and redness Risk Factors: Chemotherapy Cancer Breast Surgery Laparotomy 05/19/23 past pregnancy. Comparison Study: None. Performing Technologist: Shona Simpson  Examination Guidelines: A complete evaluation includes B-mode imaging, spectral Doppler, color Doppler, and power Doppler as needed of all accessible  portions of each vessel. Bilateral testing is considered an integral part of a complete examination. Limited examinations for reoccurring indications may be performed as noted.  Right Findings: +----------+------------+---------+-----------+----------+-------+ RIGHT     CompressiblePhasicitySpontaneousPropertiesSummary +----------+------------+---------+-----------+----------+-------+ IJV           Full       Yes       Yes                      +----------+------------+---------+-----------+----------+-------+ Subclavian    Full       Yes       Yes                      +----------+------------+---------+-----------+----------+-------+ Axillary      Full       Yes       Yes                      +----------+------------+---------+-----------+----------+-------+ Brachial      Full       Yes       Yes                      +----------+------------+---------+-----------+----------+-------+ Radial        Full       Yes       Yes                      +----------+------------+---------+-----------+----------+-------+ Ulnar         Full  Yes       Yes                      +----------+------------+---------+-----------+----------+-------+ Cephalic      None                 No      dilated   Acute  +----------+------------+---------+-----------+----------+-------+ Basilic       Full                 Yes                      +----------+------------+---------+-----------+----------+-------+ Occlusive thrombosis seen in the cephalic vein proximal to IV line.  Left Findings: +----------+------------+---------+-----------+------------------+-------+ LEFT      CompressiblePhasicitySpontaneous    Properties    Summary +----------+------------+---------+-----------+------------------+-------+ IJV           Full       Yes       Yes                              +----------+------------+---------+-----------+------------------+-------+ Subclavian    Full        Yes       Yes                              +----------+------------+---------+-----------+------------------+-------+ Axillary      Full       Yes       Yes                              +----------+------------+---------+-----------+------------------+-------+ Brachial      Full       Yes       Yes                              +----------+------------+---------+-----------+------------------+-------+ Radial        Full       Yes       Yes                              +----------+------------+---------+-----------+------------------+-------+ Ulnar         Full       Yes       Yes                              +----------+------------+---------+-----------+------------------+-------+ Cephalic    Partial                Yes    brightly echogenic Acute  +----------+------------+---------+-----------+------------------+-------+ Basilic       Full                 Yes                              +----------+------------+---------+-----------+------------------+-------+ Partial thrombosis seen in the Cephalic vein in the proximal forearm.  Summary:  Right: No evidence of deep vein thrombosis in the upper extremity. Findings consistent with acute superficial vein thrombosis involving the right cephalic vein.  Left: No evidence of deep vein thrombosis in the upper extremity. Findings consistent with acute superficial vein thrombosis involving the  left cephalic vein.  *See table(s) above for measurements and observations.  Diagnosing physician: Carolynn Sayers Electronically signed by Carolynn Sayers on 05/21/2023 at 7:15:47 AM.    Final     Anti-infectives: Anti-infectives (From admission, onward)    Start     Dose/Rate Route Frequency Ordered Stop   05/19/23 0945  cefTRIAXone (ROCEPHIN) 2 g in sodium chloride 0.9 % 100 mL IVPB  Status:  Discontinued        2 g 200 mL/hr over 30 Minutes Intravenous  Once 05/19/23 7846 05/19/23 1149   05/15/23 2200  cefTRIAXone (ROCEPHIN) 1  g in sodium chloride 0.9 % 100 mL IVPB  Status:  Discontinued        1 g 200 mL/hr over 30 Minutes Intravenous Every 24 hours 05/15/23 0628 05/18/23 1519   05/14/23 2345  cefTRIAXone (ROCEPHIN) 1 g in sodium chloride 0.9 % 100 mL IVPB        1 g 200 mL/hr over 30 Minutes Intravenous  Once 05/14/23 2344 05/15/23 0102       Assessment/Plan: s/p Procedure(s): EXPLORATORY LAPAROTOMY, LYSIS OF ADHESIONS (N/A) Advance diet Ambulate POD 2 LOA VTE: LMWH ID: rocephin for UTI    - per [facility] -  UTI Uncontrolled T2DM HTN HLD Hx of breast cancer s/p lumpectomy and chemo/rad  LOS: 6 days    Chevis Pretty III 05/21/2023

## 2023-05-21 NOTE — Plan of Care (Signed)

## 2023-05-22 DIAGNOSIS — K56609 Unspecified intestinal obstruction, unspecified as to partial versus complete obstruction: Secondary | ICD-10-CM | POA: Diagnosis not present

## 2023-05-22 LAB — GLUCOSE, CAPILLARY
Glucose-Capillary: 159 mg/dL — ABNORMAL HIGH (ref 70–99)
Glucose-Capillary: 183 mg/dL — ABNORMAL HIGH (ref 70–99)
Glucose-Capillary: 194 mg/dL — ABNORMAL HIGH (ref 70–99)

## 2023-05-22 MED ORDER — BISACODYL 10 MG RE SUPP
10.0000 mg | Freq: Two times a day (BID) | RECTAL | Status: DC | PRN
Start: 2023-05-22 — End: 2023-05-22

## 2023-05-22 MED ORDER — SIMETHICONE 40 MG/0.6ML PO SUSP: 80.0000 mg | Freq: Four times a day (QID) | ORAL | Status: DC | PRN

## 2023-05-22 MED ORDER — ACETAMINOPHEN 325 MG PO TABS: 325.0000 mg | ORAL_TABLET | Freq: Four times a day (QID) | ORAL | Status: DC | PRN

## 2023-05-22 MED ORDER — NAPHAZOLINE-GLYCERIN 0.012-0.25 % OP SOLN: 1.0000 [drp] | Freq: Four times a day (QID) | OPHTHALMIC | Status: DC | PRN

## 2023-05-22 MED ORDER — ACETAMINOPHEN 650 MG RE SUPP: 650.0000 mg | Freq: Four times a day (QID) | RECTAL | Status: DC | PRN

## 2023-05-22 MED ORDER — MENTHOL 3 MG MT LOZG: 1.0000 | LOZENGE | OROMUCOSAL | Status: DC | PRN

## 2023-05-22 MED ORDER — MAGIC MOUTHWASH: 15.0000 mL | Freq: Four times a day (QID) | ORAL | Status: DC | PRN

## 2023-05-22 MED ORDER — PHENOL 1.4 % MT LIQD
2.0000 | OROMUCOSAL | Status: DC | PRN
Start: 2023-05-22 — End: 2023-05-22

## 2023-05-22 MED ORDER — SALINE SPRAY 0.65 % NA SOLN: 1.0000 | Freq: Four times a day (QID) | NASAL | Status: DC | PRN

## 2023-05-22 MED ORDER — OXYCODONE HCL 5 MG PO TABS: 5.0000 mg | ORAL_TABLET | Freq: Four times a day (QID) | ORAL | 0 refills | Status: AC | PRN

## 2023-05-22 MED ORDER — METHOCARBAMOL 500 MG PO TABS: 500.0000 mg | ORAL_TABLET | Freq: Three times a day (TID) | ORAL | 0 refills | Status: AC

## 2023-05-22 MED ORDER — CALCIUM POLYCARBOPHIL 625 MG PO TABS
625.0000 mg | ORAL_TABLET | Freq: Two times a day (BID) | ORAL | Status: DC
  Administered 2023-05-22: 625 mg via ORAL
  Filled 2023-05-22 (×2): qty 1

## 2023-05-22 MED ORDER — ALUM & MAG HYDROXIDE-SIMETH 200-200-20 MG/5ML PO SUSP: 30.0000 mL | Freq: Four times a day (QID) | ORAL | Status: DC | PRN

## 2023-05-22 MED ORDER — CALCIUM POLYCARBOPHIL 625 MG PO TABS: 625.0000 mg | ORAL_TABLET | Freq: Two times a day (BID) | ORAL | 0 refills | Status: AC

## 2023-05-22 MED ORDER — POLYETHYLENE GLYCOL 3350 17 G PO PACK: 17.0000 g | PACK | Freq: Every day | ORAL | 0 refills | Status: DC

## 2023-05-22 NOTE — Discharge Summary (Signed)
 Physician Discharge Summary   Patient: [name] MRN: [id number] DOB: Jan 08, 1956  Admit date:     05/14/2023  Discharge date: 05/22/23  Discharge Physician: Briant Cedar   PCP: Tally Joe, MD   Recommendations at discharge:   Follow-up with PCP Follow-up with surgery as scheduled  Discharge Diagnoses: Principal Problem:   SBO (small bowel obstruction) [name])    Hospital Course: [name] is a 68 y.o. female with medical history significant for obesity, type 2 diabetes on Mounjaro, diverticulosis, who presents with sudden onset abdominal pain.  CT abdomen pelvis concerning for obstructive distal small bowel obstruction.  Hospitalist called for admission and general surgery called in consult.    Today, patient denies any complaints, able to tolerate diet, denies any worsening abdominal pain, chest pain, shortness of breath, nausea/vomiting, fever/chills.  Patient stable to be discharged home to follow-up with PCP as well as general surgery as scheduled.     Assessment and Plan:  SBO S/p ex lap with lysis of adhesions on 2/26 by Dr Chevis Pretty Hx of multiple prior abdominal surgeries Surgery outpatient follow-up Pt to discuss with PCP/provider about the possibility of discontinuing Mounjaro due to recent obstruction  RUE SVT Doppler showed SVT in R cephalic vein Elevate RUE, pain management prn   Sepsis secondary to UTI, POA, resolved WBC 13.5, tachycardia noted on admission Antibiotics completed   Type 2 diabetes mellitus, uncontrolled with hyperglycemia Hemoglobin 7.6 Home regimen PTA Mounjaro, discussed discontinuation given above obstruction, patient will follow-up with PCP   Hypertension Continue home losartan   Obesity Body mass index is 32.87 kg/m. Recommend weight loss outpatient with regular physical activity and healthy dieting. Would hold Mounjaro at discharge, follow-up with PCP for further evaluation and treatment      Pain control -  [facility] Controlled Substance Reporting System database was reviewed. and patient was instructed, not to drive, operate heavy machinery, perform activities at heights, swimming or participation in water activities or provide baby-sitting services while on Pain, Sleep and Anxiety Medications; until their outpatient Physician has advised to do so again. Also recommended to not to take more than prescribed Pain, Sleep and Anxiety Medications.    Consultants: Gen surg Procedures performed: Ex lap Disposition: Home Diet recommendation:  Cardiac and Carb modified diet   DISCHARGE MEDICATION: Allergies as of 05/22/2023       Reactions   Glimepiride    Hypoglycemia         Medication List     PAUSE taking these medications    Mounjaro 5 MG/0.5ML Pen Wait to take this until your doctor or other care provider tells you to start again. Generic drug: tirzepatide Inject 5 mg into the skin every 7 (seven) days.       TAKE these medications    Accu-Chek Guide Me w/Device Kit   Accu-Chek Guide test strip Generic drug: glucose blood   acetaminophen 500 MG tablet Commonly known as: TYLENOL Take 1,000 mg by mouth every 6 (six) hours as needed for moderate pain.   amitriptyline 75 MG tablet Commonly known as: ELAVIL Take 75 mg by mouth at bedtime.   anastrozole 1 MG tablet Commonly known as: ARIMIDEX TAKE 1 TABLET EVERY DAY   ibuprofen 200 MG tablet Commonly known as: ADVIL Take 400 mg by mouth every 6 (six) hours as needed for moderate pain or headache.   INSULIN SYRINGE .5CC/30GX5/16" 30G X 5/16" 0.5 ML Misc See admin instructions.   losartan 25 MG tablet Commonly  known as: COZAAR Take 25 mg by mouth daily.   methocarbamol 500 MG tablet Commonly known as: ROBAXIN Take 1 tablet (500 mg total) by mouth 3 (three) times daily for 5 days.   multivitamin with minerals Tabs tablet Take 1 tablet by mouth daily.   NovoLOG FlexPen 100 UNIT/ML FlexPen Generic drug:  insulin aspart Inject 0-5 Units into the skin See admin instructions. Per sliding scale   oxyCODONE 5 MG immediate release tablet Commonly known as: Oxy IR/ROXICODONE Take 1 tablet (5 mg total) by mouth every 6 (six) hours as needed for up to 5 days for severe pain (pain score 7-10) or breakthrough pain.   pantoprazole 40 MG tablet Commonly known as: PROTONIX Take 40 mg by mouth daily.   polycarbophil 625 MG tablet Commonly known as: FIBERCON Take 1 tablet (625 mg total) by mouth 2 (two) times daily.   polyethylene glycol 17 g packet Commonly known as: MiraLax Take 17 g by mouth daily.   PreviDent 5000 Dry Mouth 1.1 % Gel dental gel Generic drug: sodium fluoride Place 1 Application onto teeth 2 (two) times daily.   sertraline 50 MG tablet Commonly known as: ZOLOFT Take 50 mg by mouth daily.   sertraline 100 MG tablet Commonly known as: ZOLOFT Take 100 mg by mouth daily.   simvastatin 40 MG tablet Commonly known as: ZOCOR Take 40 mg by mouth at bedtime.   Toujeo SoloStar 300 UNIT/ML Solostar Pen Generic drug: insulin glargine (1 Unit Dial) Inject 45 Units into the skin daily.        Follow-up Information     [facility], [place]. Schedule an appointment as soon as possible for a visit in 1 week(s).   Specialty: General Surgery Why: To have sutures/staples removed from incision(s) Contact information: [address] [place] [address] [phone]        [facility], [place] Follow up in 3 week(s).   Specialty: General Surgery Why: To follow up after your hospital stay Contact information: [address] [place] [address] [phone]        Tally Joe, MD. Schedule an appointment as soon as possible for a visit.   Specialty: Family Medicine Contact information: [phone] [place] [address] [phone]                Discharge  Exam: Ceasar Mons Weights   05/14/23 2038 05/15/23 0913 05/19/23 0829  Weight: 89.4 kg 95.2 kg 95.2 kg   General: NAD  Cardiovascular: S1, S2 present Respiratory: CTAB Abdomen: Soft, nontender, nondistended, bowel sounds present, incision c/d/i Musculoskeletal: No bilateral pedal edema noted Skin: As noted above Psychiatry: Normal mood   Condition at discharge: stable  The results of significant diagnostics from this hospitalization (including imaging, microbiology, ancillary and laboratory) are listed below for reference.   Imaging Studies: VAS [place] UPPER EXTREMITY VENOUS DUPLEX Result Date: 05/21/2023 UPPER VENOUS STUDY  Patient Name:  MELIS TROCHEZ  Date of Exam:   05/20/2023 Medical Rec #: [id number]     Accession #:    [id number] Date of Birth: 27-Jun-1955     Patient Gender: F Patient Age:   51 years Exam Location:  [facility] Procedure:      VAS [place] UPPER EXTREMITY VENOUS DUPLEX Referring Phys: ABIGAIL CHAVEZ --------------------------------------------------------------------------------  Indications: Swelling, and redness Risk Factors: Chemotherapy Cancer Breast Surgery Laparotomy 05/19/23 past pregnancy. Comparison Study: None. Performing Technologist: Shona Simpson  Examination Guidelines:  A complete evaluation includes B-mode imaging, spectral Doppler, color Doppler, and power Doppler as needed of all accessible portions of each vessel. Bilateral testing is considered an integral part of a complete examination. Limited examinations for reoccurring indications may be performed as noted.  Right Findings: +----------+------------+---------+-----------+----------+-------+ RIGHT     CompressiblePhasicitySpontaneousPropertiesSummary +----------+------------+---------+-----------+----------+-------+ IJV           Full       Yes       Yes                      +----------+------------+---------+-----------+----------+-------+ Subclavian    Full       Yes       Yes                       +----------+------------+---------+-----------+----------+-------+ Axillary      Full       Yes       Yes                      +----------+------------+---------+-----------+----------+-------+ Brachial      Full       Yes       Yes                      +----------+------------+---------+-----------+----------+-------+ Radial        Full       Yes       Yes                      +----------+------------+---------+-----------+----------+-------+ Ulnar         Full       Yes       Yes                      +----------+------------+---------+-----------+----------+-------+ Cephalic      None                 No      dilated   Acute  +----------+------------+---------+-----------+----------+-------+ Basilic       Full                 Yes                      +----------+------------+---------+-----------+----------+-------+ Occlusive thrombosis seen in the cephalic vein proximal to IV line.  Left Findings: +----------+------------+---------+-----------+------------------+-------+ LEFT      CompressiblePhasicitySpontaneous    Properties    Summary +----------+------------+---------+-----------+------------------+-------+ IJV           Full       Yes       Yes                              +----------+------------+---------+-----------+------------------+-------+ Subclavian    Full       Yes       Yes                              +----------+------------+---------+-----------+------------------+-------+ Axillary      Full       Yes       Yes                              +----------+------------+---------+-----------+------------------+-------+ Brachial      Full  Yes       Yes                              +----------+------------+---------+-----------+------------------+-------+ Radial        Full       Yes       Yes                              +----------+------------+---------+-----------+------------------+-------+ Ulnar          Full       Yes       Yes                              +----------+------------+---------+-----------+------------------+-------+ Cephalic    Partial                Yes    brightly echogenic Acute  +----------+------------+---------+-----------+------------------+-------+ Basilic       Full                 Yes                              +----------+------------+---------+-----------+------------------+-------+ Partial thrombosis seen in the Cephalic vein in the proximal forearm.  Summary:  Right: No evidence of deep vein thrombosis in the upper extremity. Findings consistent with acute superficial vein thrombosis involving the right cephalic vein.  Left: No evidence of deep vein thrombosis in the upper extremity. Findings consistent with acute superficial vein thrombosis involving the left cephalic vein.  *See table(s) above for measurements and observations.  Diagnosing physician: Carolynn Sayers Electronically signed by Carolynn Sayers on 05/21/2023 at 7:15:47 AM.    Final    DG Abd Portable 1V Result Date: 05/18/2023 CLINICAL DATA:  Small-bowel obstruction EXAM: PORTABLE ABDOMEN - 1 VIEW COMPARISON:  05/17/2023 FINDINGS: Enteric tube remains positioned within the stomach. Multiple mildly dilated loops of small bowel measuring up to 3.9 cm in diameter, unchanged from prior. Enteric contrast is present throughout the colon and rectum. No gross free intraperitoneal air. IMPRESSION: Unchanged mildly dilated loops of small bowel. Partial or intermittent obstruction remains a concern. Electronically Signed   By: Duanne Guess D.O.   On: 05/18/2023 12:04   DG Abd Portable 1V-Small Bowel Obstruction Protocol-initial, 8 hr delay Result Date: 05/17/2023 CLINICAL DATA:  Small-bowel obstruction, 8 hour delay imaging EXAM: PORTABLE ABDOMEN - 1 VIEW COMPARISON:  05/17/2023 FINDINGS: Supine frontal view of the abdomen and pelvis was obtained 8 hours after oral contrast administration. Stable enteric  catheter overlying the gastric body. There is continued gaseous distention of the small bowel measuring up to 3.9 cm. Continued progression of oral contrast throughout the colon. No masses or abnormal calcifications. IMPRESSION: 1. Persistent gaseous distention of the small bowel, with continued progression of wall contrast throughout the colon. Findings may reflect resolving or intermittent small bowel obstruction. Electronically Signed   By: Sharlet Salina M.D.   On: 05/17/2023 22:07   DG Abd Portable 1V Result Date: 05/17/2023 CLINICAL DATA:  Small-bowel obstruction. EXAM: PORTABLE ABDOMEN - 1 VIEW COMPARISON:  Radiographs 05/16/2023 and 05/15/2023.  CT 05/14/2023. FINDINGS: 0739 hours. Tip of the enteric tube overlies the mid stomach. Most of the contrast previously demonstrated within the stomach has emptied from the stomach and is present within nondilated distal small bowel and  proximal colon. There are persistently dilated loops of small bowel in the left mid abdomen. No extravasated enteric contrast or pneumoperitoneum identified. Cholecystectomy clips, convex right thoracolumbar scoliosis and spondylosis again noted. IMPRESSION: Persistently dilated loops of small bowel in the left mid abdomen suggesting low grade partial small bowel obstruction. Contrast has passed into the proximal colon. No extravasated contrast or pneumoperitoneum identified. Electronically Signed   By: Carey Bullocks M.D.   On: 05/17/2023 11:34   DG Abd 2 Views Result Date: 05/16/2023 CLINICAL DATA:  Small bowel obstruction. EXAM: ABDOMEN - 2 VIEW COMPARISON:  May 15, 2023. FINDINGS: Nasogastric tube tip is seen in proximal stomach. Dilated small bowel loops are noted concerning for distal small bowel obstruction or possibly ileus. No colonic dilatation is noted. Residual contrast is noted in nondilated right colon. IMPRESSION: Small bowel dilatation is now noted concerning for distal small bowel obstruction or possibly  ileus. Electronically Signed   By: Lupita Raider M.D.   On: 05/16/2023 10:28   DG Abd Portable 1V-Small Bowel Obstruction Protocol-initial, 8 hr delay Result Date: 05/15/2023 CLINICAL DATA:  Small-bowel obstruction EXAM: PORTABLE ABDOMEN - 1 VIEW COMPARISON:  05/15/2023 FINDINGS: NG tube is position within the stomach. There is enteric contrast within the gastric lumen. No contrast within the small bowel. Bowel gas pattern is nonobstructive. Excreted contrast is present within the urinary bladder. Cholecystectomy clips. IMPRESSION: 1. Nonobstructive bowel gas pattern. Enteric contrast is present within the stomach. 2. NG tube within the stomach. Electronically Signed   By: Duanne Guess D.O.   On: 05/15/2023 14:40   DG Abd Portable 1V-Small Bowel Protocol-Position Verification Result Date: 05/15/2023 CLINICAL DATA:  Nasogastric tube placement EXAM: PORTABLE ABDOMEN - 1 VIEW COMPARISON:  None Available. FINDINGS: Tip and side port of the nasogastric tube project within the stomach. IMPRESSION: Tip and side port of the nasogastric tube in the stomach. Electronically Signed   By: Deatra Robinson M.D.   On: 05/15/2023 02:15   CT ABDOMEN PELVIS W CONTRAST Result Date: 05/14/2023 CLINICAL DATA:  Generalized abdominal pain, vomiting. EXAM: CT ABDOMEN AND PELVIS WITH CONTRAST TECHNIQUE: Multidetector CT imaging of the abdomen and pelvis was performed using the standard protocol following bolus administration of intravenous contrast. RADIATION DOSE REDUCTION: This exam was performed according to the departmental dose-optimization program which includes automated exposure control, adjustment of the mA and/or kV according to patient size and/or use of iterative reconstruction technique. CONTRAST:  OMNIPAQUE IOHEXOL 300 MG/ML  SOLN COMPARISON:  01/06/2021 FINDINGS: Lower chest: Dependent atelectasis in the lower lobes. Coronary artery and aortic atherosclerosis. Hepatobiliary: Prior cholecystectomy. Diffuse  fatty infiltration of the liver. No focal hepatic abnormality. Pancreas: No focal abnormality or ductal dilatation. Spleen: No focal abnormality.  Normal size. Adrenals/Urinary Tract: No adrenal abnormality. No focal renal abnormality. No stones or hydronephrosis. Urinary bladder is unremarkable. Stomach/Bowel: Normal appendix. With scattered left colonic diverticulosis. No active diverticulitis. Dilated fluid-filled small bowel loops into the pelvis. Distal small bowel is decompressed. Findings concerning for small bowel obstruction. Transition appears to be in the midline of the lower pelvis, presumably adhesion related. Stomach is moderately distended with fluid. Vascular/Lymphatic: No evidence of aneurysm or adenopathy. Scattered aortic calcifications. Reproductive: Prior hysterectomy.  No adnexal masses. Other: Trace free fluid in the cul-de-sac.  No free air. Musculoskeletal: No acute bony abnormality IMPRESSION: Distended, fluid-filled stomach and small bowel into the pelvis. Distal small bowel is decompressed. Findings most compatible with distal small bowel obstruction with transition in the midline of  the lower pelvis. Scattered left colonic diverticulosis. No active diverticulitis. Hepatic steatosis. Aortic atherosclerosis. Electronically Signed   By: Charlett Nose M.D.   On: 05/14/2023 23:33   CT BIOPSY Result Date: 05/12/2023 CLINICAL DATA:  Daily right SI pain after lifting injury 2023 EXAM: RIGHT CT GUIDED SI JOINT INJECTION PROCEDURE: After a thorough discussion of risks and benefits of the procedure, including bleeding, infection, injury to nerves, blood vessels, and adjacent structures, verbal and written consent was obtained. Specific risks of the procedure included nondiagnostic/nontherapeutic injection and non target injection. The patient was placed prone on the CT table and localization was performed over the sacrum. Target site marked using CT guidance. The skin was prepped and draped in  the usual sterile fashion using Betadine soap. After local anesthesia with 1% lidocaine without epinephrine and subsequent deep anesthesia, a 22g spinal needle was advanced into the posterior inferior aspect right SI joint under intermittent CT guidance. Once the needle was in satisfactory position, representative image was captured with the needle demonstrated in the sacroiliac joint. Subsequently, 2 mL bupivacaine 0.5% was injected into the right SI joint. Needles removed and a sterile dressing applied. No complications were observed. RADIATION DOSE REDUCTION: This exam was performed according to the departmental dose-optimization program which includes automated exposure control, adjustment of the mA and/or kV according to patient size and/or use of iterative reconstruction technique. IMPRESSION: Successful CT guided right SI joint injection with bupivacaine. Electronically Signed   By: Corlis Leak M.D.   On: 05/12/2023 08:30   MR LUMBAR SPINE WO CONTRAST Result Date: 05/09/2023 CLINICAL DATA:  Lumbar region back pain over the last year. Pain extends to the right leg. EXAM: MRI LUMBAR SPINE WITHOUT CONTRAST TECHNIQUE: Multiplanar, multisequence MR imaging of the lumbar spine was performed. No intravenous contrast was administered. COMPARISON:  CT abdomen 01/06/2021 FINDINGS: Segmentation:  5 lumbar type vertebral bodies. Alignment: Scoliotic curvature convex to the right with the apex at L2. 2 mm of degenerative anterolisthesis at L5-S1. Vertebrae: No fracture or focal bone lesion. No edematous endplate marrow changes or edematous facet arthritis. Conus medullaris and cauda equina: Conus extends to the L1 level. Conus and cauda equina appear normal. Paraspinal and other soft tissues: Negative Disc levels: T11-12: Disc bulge. Mild facet and ligamentous hypertrophy. No compressive stenosis. T12-L1: Normal L1-2: Mild bulging of the disc. Mild facet hypertrophy on the left. No compressive stenosis. L2-3: Disc  degeneration on the left. Endplate osteophytes and bulging of the disc. Mild facet and ligamentous hypertrophy. Mild narrowing of the left lateral recess but no visible neural compression. L3-4: Mild bulging of the disc. Bilateral facet degeneration and hypertrophy. Mild stenosis of both lateral recesses, but no visible neural compression. L4-5: Moderate bulging of the disc more prominent towards the right. Bilateral facet and ligamentous hypertrophy. Mild narrowing of the right lateral recess. Moderate right foraminal narrowing. Some potential that in particular the right L4 nerve could be affected at this level. L5-S1: Bilateral facet osteoarthritis with 2 mm of anterolisthesis. No disc herniation. No compressive stenosis of the canal or foramina. IMPRESSION: 1. Scoliotic curvature convex to the right with the apex at L2. Multilevel degenerative disc disease and degenerative facet disease which could relate to regional nonspecific pain. 2. L4-5: Moderate bulging of the disc more prominent towards the right. Bilateral facet and ligamentous hypertrophy. Mild narrowing of the right lateral recess. Moderate right foraminal narrowing. Some potential that in particular the right L4 nerve could be affected at this level. 3. L3-4: Mild  bulging of the disc. Bilateral facet degeneration and hypertrophy. Mild stenosis of both lateral recesses, but no visible neural compression. 4. L2-3: Disc degeneration on the left. Endplate osteophytes and bulging of the disc. Mild facet and ligamentous hypertrophy. Mild narrowing of the left lateral recess but no visible neural compression. 5. L5-S1: Bilateral facet osteoarthritis with 2 mm of anterolisthesis. No compressive stenosis of the canal or foramina. Electronically Signed   By: Paulina Fusi M.D.   On: 05/09/2023 18:07    Microbiology: Results for orders placed or performed during the hospital encounter of 05/14/23  Blood culture (routine x 2)     Status: None   Collection  Time: 05/14/23 12:00 AM   Specimen: BLOOD  Result Value Ref Range Status   Specimen Description   Final    BLOOD RIGHT ANTECUBITAL Performed at [facility], [address] [place], [place] [address]    Special Requests   Final    BOTTLES DRAWN AEROBIC AND ANAEROBIC Blood Culture adequate volume Performed at [facility], [address], [place], [place] [address]    Culture   Final    NO GROWTH 5 DAYS Performed at [facility], [address], [place], [place] [address]    Report Status 05/20/2023 FINAL  Final  Urine Culture     Status: Abnormal   Collection Time: 05/14/23  9:01 PM   Specimen: Urine, Random  Result Value Ref Range Status   Specimen Description   Final    URINE, RANDOM Performed at [facility], [address], [place], [place] [address]    Special Requests   Final    NONE Performed at [facility], [address] [place], [place] [address]    Culture (A)  Final    >=100,000 COLONIES/mL ESCHERICHIA COLI 40,000 COLONIES/mL AEROCOCCUS URINAE Standardized susceptibility testing for this organism is not available. Performed at [facility], [address], [place], [place] [address]    Report Status 05/18/2023 FINAL  Final   Organism ID, Bacteria ESCHERICHIA COLI (A)  Final      Susceptibility   Escherichia coli - MIC*    AMPICILLIN >=32 RESISTANT Resistant     CEFAZOLIN <=4 SENSITIVE Sensitive     CEFEPIME <=0.12 SENSITIVE Sensitive     CEFTRIAXONE <=0.25 SENSITIVE Sensitive     CIPROFLOXACIN <=0.25 SENSITIVE Sensitive     GENTAMICIN <=1 SENSITIVE Sensitive     IMIPENEM <=0.25 SENSITIVE Sensitive     NITROFURANTOIN <=16 SENSITIVE Sensitive     TRIMETH/SULFA <=20 SENSITIVE Sensitive     AMPICILLIN/SULBACTAM 16 INTERMEDIATE Intermediate     PIP/TAZO <=4 SENSITIVE Sensitive ug/mL    * >=100,000 COLONIES/mL ESCHERICHIA COLI  Blood culture (routine x 2)     Status: None    Collection Time: 05/14/23 11:58 PM   Specimen: BLOOD  Result Value Ref Range Status   Specimen Description   Final    BLOOD LEFT ANTECUBITAL Performed at [facility], [address] [place], [place] [address]    Special Requests   Final    BOTTLES DRAWN AEROBIC AND ANAEROBIC Blood Culture results may not be optimal due to an inadequate volume of blood received in culture bottles Performed at [facility], [address], [place], [place] [address]    Culture   Final    NO GROWTH 5 DAYS Performed at [facility], [address], [place], [place] [address]  Report Status 05/20/2023 FINAL  Final  Urine Culture     Status: Abnormal   Collection Time: 05/15/23  1:41 PM   Specimen: Urine, Random  Result Value Ref Range Status   Specimen Description   Final    URINE, RANDOM Performed at [facility], [address], [place], [place] [address]    Special Requests URINE, CLEAN CATCH  Final   Culture (A)  Final    <10,000 COLONIES/mL INSIGNIFICANT GROWTH Performed at [facility], [address], [place], [place] [address]    Report Status 05/16/2023 FINAL  Final  Surgical pcr screen     Status: Abnormal   Collection Time: 05/19/23  7:04 AM   Specimen: Nasal Mucosa; Nasal Swab  Result Value Ref Range Status   MRSA, PCR NEGATIVE NEGATIVE Final   Staphylococcus aureus POSITIVE (A) NEGATIVE Final    Comment: (NOTE) The Xpert SA Assay (FDA approved for NASAL specimens in patients 22 years of age and older), is one component of a comprehensive surveillance program. It is not intended to diagnose infection nor to guide or monitor treatment. Performed at [facility], [address] [place], [place] [address]     Labs: CBC: Recent Labs  Lab 05/16/23 859 [id number] 1106 05/18/23 0550 05/20/23 0749  WBC 8.6 8.8 8.5  HGB 13.1 13.4 12.1  HCT 41.6 41.4 36.3  MCV 95.6 95.6 93.3  PLT 225 204 198    Basic Metabolic Panel: Recent Labs  Lab 05/16/23 0640 05/17/23 0927 05/18/23 0550 05/20/23 0618  NA 141 139 140 133*  K 4.4 3.7 3.6 3.7  CL 100 102 102 101  CO2 32 22 23 20*  GLUCOSE 116* 85 112* 118*  BUN 26* 22 17 11   CREATININE 0.84 0.53 0.56 0.54  CALCIUM 9.2 9.1 9.2 8.2*  MG  --  2.0  --   --    Liver Function Tests: No results for input(s): "AST", "ALT", "ALKPHOS", "BILITOT", "PROT", "ALBUMIN" in the last 168 hours. CBG: Recent Labs  Lab 05/21/23 1637 05/21/23 2220 05/22/23 0738 05/22/23 1153 05/22/23 1715  GLUCAP 166* 172* 159* 183* 194*    Discharge time spent: greater than 30 minutes.  Signed: Briant Cedar, MD [facility] 05/22/2023

## 2023-05-22 NOTE — Progress Notes (Signed)
 Honeycomb dressing removed. Incision cleaned with soap and water.

## 2023-05-22 NOTE — Discharge Instructions (Signed)
 SURGERY: POST OP INSTRUCTIONS (Surgery for small bowel obstruction) ######################################################################  EAT Gradually transition to a high fiber diet with a fiber supplement over the next few days after discharge  WALK Walk an hour a day.  Control your pain to do that.    CONTROL PAIN Control pain so that you can walk, sleep, tolerate sneezing/coughing, go up/down stairs.  HAVE A BOWEL MOVEMENT DAILY Keep your bowels regular to avoid problems.  OK to try a laxative to override constipation.  OK to use an antidairrheal to slow down diarrhea.  Call if not better after 2 tries  CALL IF YOU HAVE PROBLEMS/CONCERNS Call if you are still struggling despite following these instructions. Call if you have concerns not answered by these instructions  ######################################################################   DIET Follow a light diet the first few days at home.  Start with a bland diet such as soups, liquids, starchy foods, low fat foods, etc.  If you feel full, bloated, or constipated, stay on a ful liquid or pureed/blenderized diet for a few days until you feel better and no longer constipated. Be sure to drink plenty of fluids every day to avoid getting dehydrated (feeling dizzy, not urinating, etc.). Gradually add a fiber supplement to your diet over the next week.  Gradually get back to a regular solid diet.  Avoid fast food or heavy meals the first week as you are more likely to get nauseated. It is expected for your digestive tract to need a few months to get back to normal.  It is common for your bowel movements and stools to be irregular.  You will have occasional bloating and cramping that should eventually fade away.  Until you are eating solid food normally, off all pain medications, and back to regular activities; your bowels will not be normal. Focus on eating a low-fat, high fiber diet the rest of your life (See Getting to Good Bowel  Health, below).  CARE of your INCISION or WOUND  It is good for closed incisions and even open wounds to be washed every day.  Shower every day.  Short baths are fine.  Wash the incisions and wounds clean with soap & water.    You may leave closed incisions open to air if it is dry.   You may cover the incision with clean gauze & replace it after your daily shower for comfort.  STAPLES: You have skin staples.  Leave them in place & set up an appointment for them to be removed by a surgery office nurse ~10 days after surgery.    If you have an open wound with a wound vac, see wound vac care instructions.    ACTIVITIES as tolerated Start light daily activities --- self-care, walking, climbing stairs-- beginning the day after surgery.  Gradually increase activities as tolerated.  Control your pain to be active.  Stop when you are tired.  Ideally, walk several times a day, eventually an hour a day.   Most people are back to most day-to-day activities in a few weeks.  It takes 4-8 weeks to get back to unrestricted, intense activity. If you can walk 30 minutes without difficulty, it is safe to try more intense activity such as jogging, treadmill, bicycling, low-impact aerobics, swimming, etc. Save the most intensive and strenuous activity for last (Usually 4-8 weeks after surgery) such as sit-ups, heavy lifting, contact sports, etc.  Refrain from any intense heavy lifting or straining until you are off narcotics for pain control.  You will  have off days, but things should improve week-by-week. DO NOT PUSH THROUGH PAIN.  Let pain be your guide: If it hurts to do something, don't do it.  Pain is your body warning you to avoid that activity for another week until the pain goes down. You may drive when you are no longer taking narcotic prescription pain medication, you can comfortably wear a seatbelt, and you can safely make sudden turns/stops to protect yourself without hesitating due to pain. You may  have sexual intercourse when it is comfortable. If it hurts to do something, stop.   MEDICATIONS Take your usually prescribed home medications unless otherwise directed.    Blood thinners:  You can restart any strong blood thinners after the second postoperative day  for example: COUMADIN (warfarin), XERELTO (rivaroxaban), ELIQUIS (apixaban), PLAVIX (clopidigrel), BRILINTA (ticagrelor), EFFIENT (prasugrel), PRADAXA (dabigatran), etc  Continue aspirin before & after surgery..     Some oozing/bleeding the first 1-2 weeks is common but should taper down & be small volume.    If you are passing many large clots or having uncontrolling bleeding, call your surgeon    PAIN CONTROL Pain after surgery or related to activity is often due to strain/injury to muscle, tendon, nerves and/or incisions.  This pain is usually short-term and will improve in a few months.  To help speed the process of healing and to get back to regular activity more quickly, DO THE FOLLOWING THINGS TOGETHER: Increase activity gradually.  DO NOT PUSH THROUGH PAIN Use Ice and/or Heat Try Gentle Massage and/or Stretching Take over the counter pain medication Take Narcotic prescription pain medication for more severe pain  Good pain control = faster recovery.  It is better to take more medicine to be more active than to stay in bed all day to avoid medications.  Increase activity gradually Avoid heavy lifting at first, then increase to lifting as tolerated over the next 6 weeks. Do not "push through" the pain.  Listen to your body and avoid positions and maneuvers than reproduce the pain.  Wait a few days before trying something more intense Walking an hour a day is encouraged to help your body recover faster and more safely.  Start slowly and stop when getting sore.  If you can walk 30 minutes without stopping or pain, you can try more intense activity (running, jogging, aerobics, cycling, swimming, treadmill, sex, sports,  weightlifting, etc.) Remember: If it hurts to do it, then don't do it! Use Ice and/or Heat You will have swelling and bruising around the incisions.  This will take several weeks to resolve. Ice packs or heating pads (6-8 times a day, 30-60 minutes at a time) will help sooth soreness & bruising. Some people prefer to use ice alone, heat alone, or alternate between ice & heat.  Experiment and see what works best for you.  Consider trying ice for the first few days to help decrease swelling and bruising; then, switch to heat to help relax sore spots and speed recovery. Shower every day.  Short baths are fine.  It feels good!  Keep the incisions and wounds clean with soap & water.   Try Gentle Massage and/or Stretching Massage at the area of pain many times a day Stop if you feel pain - do not overdo it Take over the counter pain medication This helps the muscle and nerve tissues become less irritable and calm down faster Choose ONE of the following over-the-counter anti-inflammatory medications: Acetaminophen 500mg  tabs (Tylenol) 1-2 pills with  every meal and just before bedtime (avoid if you have liver problems or if you have acetaminophen in you narcotic prescription) Naproxen 220mg  tabs (ex. Aleve, Naprosyn) 1-2 pills twice a day (avoid if you have kidney, stomach, IBD, or bleeding problems) Ibuprofen 200mg  tabs (ex. Advil, Motrin) 3-4 pills with every meal and just before bedtime (avoid if you have kidney, stomach, IBD, or bleeding problems) Take with food/snack several times a day as directed for at least 2 weeks to help keep pain / soreness down & more manageable. Take Narcotic prescription pain medication for more severe pain A prescription for strong pain control is often given to you upon discharge (for example: oxycodone/Percocet, hydrocodone/Norco/Vicodin, or tramadol/Ultram) Take your pain medication as prescribed. Be mindful that most narcotic prescriptions contain Tylenol  (acetaminophen) as well - avoid taking too much Tylenol. If you are having problems/concerns with the prescription medicine (does not control pain, nausea, vomiting, rash, itching, etc.), please call us [phone] to see if we need to switch you to a different pain medicine that will work better for you and/or control your side effects better. If you need a refill on your pain medication, you must call the office before 4 pm and on weekdays only.  By federal law, prescriptions for narcotics cannot be called into a pharmacy.  They must be filled out on paper & picked up from our office by the patient or authorized caretaker.  Prescriptions cannot be filled after 4 pm nor on weekends.    WHEN TO CALL us [phone] Severe uncontrolled or worsening pain  Fever over 101 F (38.5 C) Concerns with the incision: Worsening pain, redness, rash/hives, swelling, bleeding, or drainage Reactions / problems with new medications (itching, rash, hives, nausea, etc.) Nausea and/or vomiting Difficulty urinating Difficulty breathing Worsening fatigue, dizziness, lightheadedness, blurred vision Other concerns If you are not getting better after two weeks or are noticing you are getting worse, contact our office [phone] for further advice.  We may need to adjust your medications, re-evaluate you in the office, send you to the emergency room, or see what other things we can do to help. The clinic staff is available to answer your questions during regular business hours (8:30am-5pm).  Please don't hesitate to call and ask to speak to one of our nurses for clinical concerns.    A surgeon from [facility] is always on call at the hospitals 24 hours/day If you have a medical emergency, go to the nearest emergency room or call 911.  FOLLOW UP in our office One the day of your discharge from the hospital (or the next business weekday), please call [facility] to set up or confirm an  appointment to see your surgeon in the office for a follow-up appointment.  Usually it is 2-3 weeks after your surgery.   If you have skin staples at your incision(s), let the office know so we can set up a time in the office for the nurse to remove them (usually around 10 days after surgery). Make sure that you call for appointments the day of discharge (or the next business weekday) from the hospital to ensure a convenient appointment time. IF YOU HAVE DISABILITY OR FAMILY LEAVE FORMS, BRING THEM TO THE OFFICE FOR PROCESSING.  DO NOT GIVE THEM TO YOUR DOCTOR.  [facility] [address], [place], [place]  [address] ? [phone] - Main [phone] - Toll Free,  [phone] - Fax [email or web address]  GETTING TO GOOD BOWEL HEALTH. It is expected for your digestive tract to need a few months to get back to normal.  It is common for your bowel movements and stools to be irregular.  You will have occasional bloating and cramping that should eventually fade away.  Until you are eating solid food normally, off all pain medications, and back to regular activities; your bowels will not be normal.   Avoiding constipation The goal: ONE SOFT BOWEL MOVEMENT A DAY!    Drink plenty of fluids.  Choose water first. TAKE A FIBER SUPPLEMENT EVERY DAY THE REST OF YOUR LIFE During your first week back home, gradually add back a fiber supplement every day Experiment which form you can tolerate.   There are many forms such as powders, tablets, wafers, gummies, etc Psyllium bran (Metamucil), methylcellulose (Citrucel), Miralax or Glycolax, Benefiber, Flax Seed.  Adjust the dose week-by-week (1/2 dose/day to 6 doses a day) until you are moving your bowels 1-2 times a day.  Cut back the dose or try a different fiber product if it is giving you problems such as diarrhea or bloating. Sometimes a laxative is needed to help jump-start bowels if constipated until the  fiber supplement can help regulate your bowels.  If you are tolerating eating & you are farting, it is okay to try a gentle laxative such as double dose MiraLax, prune juice, or Milk of Magnesia.  Avoid using laxatives too often. Stool softeners can sometimes help counteract the constipating effects of narcotic pain medicines.  It can also cause diarrhea, so avoid using for too long. If you are still constipated despite taking fiber daily, eating solids, and a few doses of laxatives, call our office. Controlling diarrhea Try drinking liquids and eating bland foods for a few days to avoid stressing your intestines further. Avoid dairy products (especially milk & ice cream) for a short time.  The intestines often can lose the ability to digest lactose when stressed. Avoid foods that cause gassiness or bloating.  Typical foods include beans and other legumes, cabbage, broccoli, and dairy foods.  Avoid greasy, spicy, fast foods.  Every person has some sensitivity to other foods, so listen to your body and avoid those foods that trigger problems for you. Probiotics (such as active yogurt, Align, etc) may help repopulate the intestines and colon with normal bacteria and calm down a sensitive digestive tract Adding a fiber supplement gradually can help thicken stools by absorbing excess fluid and retrain the intestines to act more normally.  Slowly increase the dose over a few weeks.  Too much fiber too soon can backfire and cause cramping & bloating. It is okay to try and slow down diarrhea with a few doses of antidiarrheal medicines.   Bismuth subsalicylate (ex. Kayopectate, Pepto Bismol) for a few doses can help control diarrhea.  Avoid if pregnant.   Loperamide (Imodium) can slow down diarrhea.  Start with one tablet (2mg ) first.  Avoid if you are having fevers or severe pain.  ILEOSTOMY PATIENTS WILL HAVE CHRONIC DIARRHEA since their colon is not in use.    Drink plenty of liquids.  You will need to drink  even more glasses of water/liquid a day to avoid getting dehydrated. Record output from your ileostomy.  Expect to empty the bag every 3-4 hours at first.  Most people with a permanent ileostomy empty their bag 4-6 times at the least.   Use antidiarrheal medicine (especially Imodium) several times a day to avoid getting dehydrated.  Start with a dose at bedtime & breakfast.  Adjust up or down as needed.  Increase antidiarrheal medications as directed to avoid emptying the bag more than 8 times a day (every 3 hours). Work with your wound ostomy nurse to learn care for your ostomy.  See ostomy care instructions. TROUBLESHOOTING IRREGULAR BOWELS 1) Start with a soft & bland diet. No spicy, greasy, or fried foods.  2) Avoid gluten/wheat or dairy products from diet to see if symptoms improve. 3) Miralax 17gm or flax seed mixed in 8oz. water or juice-daily. May use 2-4 times a day as needed. 4) Gas-X, Phazyme, etc. as needed for gas & bloating.  5) Prilosec (omeprazole) over-the-counter as needed 6)  Consider probiotics (Align, Activa, etc) to help calm the bowels down  Call your doctor if you are getting worse or not getting better.  Sometimes further testing (cultures, endoscopy, X-ray studies, CT scans, bloodwork, etc.) may be needed to help diagnose and treat the cause of the diarrhea. [facility] [address], [address], [place], [place]  [address] [phone] - Main.    [phone]  - Toll Free.   [phone] - Fax [email or web address]

## 2023-05-22 NOTE — Progress Notes (Signed)
 05/22/2023  [name] [id number] 1955/08/15  CARE TEAM: PCP: Tally Joe, MD  Outpatient Care Team: Patient Care Team: Tally Joe, MD as PCP - General (Family Medicine) [name], RN as Oncology Nurse Navigator [name], RN as Oncology Nurse Navigator [name], MD as Consulting Physician (General Surgery) [name], MD as Attending Physician (Radiation Oncology) [name], MD as Consulting Physician (Obstetrics and Gynecology) [name], MD as Referring Physician (Endocrinology)  Inpatient Treatment Team: Treatment Team:  Briant Cedar, MD [facility], Md, MD Alton Revere, MD Luanna Cole, RN Noel Christmas, NT Adriana Mccallum, RN Mendel Corning, NT Lynden Ang, [facility] [place], [place] Alyse Low, RN   Problem List:   Principal Problem:   SBO (small bowel obstruction) [facility])   05/19/2023  POST-OPERATIVE DIAGNOSIS:  Small bowel obstruction   PROCEDURE:  EXPLORATORY LAPAROTOMY, LYSIS OF ADHESIONS    SURGEON: Griselda Miner, MD - Primary      Assessment [facility] Stay = 7 days) 3 Days Post-Op    Improving    Plan:  Bowel obstruction resolved.  Solid diet  Fiber bowel regimen  Will need staples removed around 10-14 days postop.  Can have nurse only visit in about a week after discharge.  Then see surgery 3-4 weeks postop.  -VTE prophylaxis- SCDs, etc  -mobilize as tolerated to help recovery  -Disposition: Per primary service.  Okay to discharge home from surgery standpoint       I reviewed nursing notes, hospitalist notes, last 24 h vitals and pain scores, last 48 h intake and output, last 24 h labs and trends, and last 24 h imaging results.  I have reviewed this patient's available data, including medical history, events of note, test results, etc as part of my evaluation.   A significant portion of that time was spent in counseling. Care during the described time interval was  provided by me.  This care required moderate level of medical decision making.  05/22/2023    Subjective: (Chief complaint)  Patient feeling much better overall  No more nausea or vomiting.  Tolerating liquids and soft diet.  Hoping to go home this weekend  Nurse in room  Objective:  Vital signs:  Vitals:   05/21/23 0844 05/21/23 1436 05/21/23 2215 05/22/23 0434  BP:  (!) 161/72 (!) 164/75 (!) 167/76  Pulse:  91 86 91  Resp: 16 17 18 18   Temp:  98.2 F (36.8 C) 98.3 F (36.8 C) 98.7 F (37.1 C)  TempSrc:  Oral Oral Oral  SpO2:  96% 96% 91%  Weight:      Height:        Last BM Date : 05/20/23  Intake/Output   Yesterday:  02/28 0701 - 03/01 0700 In: 1148.6 [P.O.:1140; I.V.:8.6] Out: -  This shift:  No intake/output data recorded.  Bowel function:  Flatus: YES  BM:  YES  Drain: (No drain)   Physical Exam:  General: Pt awake/alert in no acute distress Eyes: PERRL, normal EOM.  Sclera clear.  No icterus Neuro: CN II-XII intact w/o focal sensory/motor deficits. Lymph: No head/neck/groin lymphadenopathy Psych:  No delerium/psychosis/paranoia.  Oriented x 4 HENT: Normocephalic, Mucus membranes moist.  No thrush Neck: Supple, No tracheal deviation.  No obvious thyromegaly Chest: No pain to chest wall compression.  Good respiratory excursion.  No audible wheezing CV:  Pulses intact.  Regular rhythm.  No major extremity edema MS: Normal AROM mjr joints.  No obvious deformity  Abdomen: Obese but Soft.  Nondistended.   Midline incision dressing with old blood.  No cellulitis or abscess.  Minimal sensitivity.  No guarding. .  No evidence of peritonitis.  No incarcerated hernias.  Ext:   No deformity.  No mjr edema.  No cyanosis Skin: No petechiae / purpurea.  No major sores.  Warm and dry    Results:   Cultures: Recent Results (from the past 720 hours)  Blood culture (routine x 2)     Status: None   Collection Time: 05/14/23 12:00 AM   Specimen: BLOOD   Result Value Ref Range Status   Specimen Description   Final    BLOOD RIGHT ANTECUBITAL Performed at [facility], [address], [place], [place] [address]    Special Requests   Final    BOTTLES DRAWN AEROBIC AND ANAEROBIC Blood Culture adequate volume Performed at [facility], [address] [place], [place] [address]    Culture   Final    NO GROWTH 5 DAYS Performed at [facility], [address] [place], [place] [address]    Report Status 05/20/2023 FINAL  Final  Urine Culture     Status: Abnormal   Collection Time: 05/14/23  9:01 PM   Specimen: Urine, Random  Result Value Ref Range Status   Specimen Description   Final    URINE, RANDOM Performed at [facility], [address], [place], [place] [address]    Special Requests   Final    NONE Performed at [facility], [address] [place], [place] [address]    Culture (A)  Final    >=100,000 COLONIES/mL ESCHERICHIA COLI 40,000 COLONIES/mL AEROCOCCUS URINAE Standardized susceptibility testing for this organism is not available. Performed at [facility], [address] [place], [place] [address]    Report Status 05/18/2023 FINAL  Final   Organism ID, Bacteria ESCHERICHIA COLI (A)  Final      Susceptibility   Escherichia coli - MIC*    AMPICILLIN >=32 RESISTANT Resistant     CEFAZOLIN <=4 SENSITIVE Sensitive     CEFEPIME <=0.12 SENSITIVE Sensitive     CEFTRIAXONE <=0.25 SENSITIVE Sensitive     CIPROFLOXACIN <=0.25 SENSITIVE Sensitive     GENTAMICIN <=1 SENSITIVE Sensitive     IMIPENEM <=0.25 SENSITIVE Sensitive     NITROFURANTOIN <=16 SENSITIVE Sensitive     TRIMETH/SULFA <=20 SENSITIVE Sensitive     AMPICILLIN/SULBACTAM 16 INTERMEDIATE Intermediate     PIP/TAZO <=4 SENSITIVE Sensitive ug/mL    * >=100,000 COLONIES/mL ESCHERICHIA COLI  Blood culture (routine x 2)     Status: None   Collection Time: 05/14/23 11:58 PM    Specimen: BLOOD  Result Value Ref Range Status   Specimen Description   Final    BLOOD LEFT ANTECUBITAL Performed at [facility], [address] [place], [place] [address]    Special Requests   Final    BOTTLES DRAWN AEROBIC AND ANAEROBIC Blood Culture results may not be optimal due to an inadequate volume of blood received in culture bottles Performed at [facility], [address] [place], [place] [address]    Culture   Final    NO GROWTH 5 DAYS Performed at [facility], [address], [place], [place] [address]    Report Status 05/20/2023 FINAL  Final  Urine Culture     Status: Abnormal   Collection Time: 05/15/23  1:41  PM   Specimen: Urine, Random  Result Value Ref Range Status   Specimen Description   Final    URINE, RANDOM Performed at [facility], [address] [place], [place] [address]    Special Requests URINE, CLEAN CATCH  Final   Culture (A)  Final    <10,000 COLONIES/mL INSIGNIFICANT GROWTH Performed at [facility], [address], [place], [place] [address]    Report Status 05/16/2023 FINAL  Final  Surgical pcr screen     Status: Abnormal   Collection Time: 05/19/23  7:04 AM   Specimen: Nasal Mucosa; Nasal Swab  Result Value Ref Range Status   MRSA, PCR NEGATIVE NEGATIVE Final   Staphylococcus aureus POSITIVE (A) NEGATIVE Final    Comment: (NOTE) The Xpert SA Assay (FDA approved for NASAL specimens in patients 38 years of age and older), is one component of a comprehensive surveillance program. It is not intended to diagnose infection nor to guide or monitor treatment. Performed at [facility], [address], [place], [place] [address]     Labs: Results for orders placed or performed during the hospital encounter of 05/14/23 (from the past 48 hours)  Glucose, capillary     Status: Abnormal   Collection Time: 05/20/23  7:53 AM  Result Value Ref Range    Glucose-Capillary 124 (H) 70 - 99 mg/dL    Comment: Glucose reference range applies only to samples taken after fasting for at least 8 hours.  Glucose, capillary     Status: Abnormal   Collection Time: 05/20/23 11:46 AM  Result Value Ref Range   Glucose-Capillary 162 (H) 70 - 99 mg/dL    Comment: Glucose reference range applies only to samples taken after fasting for at least 8 hours.  Glucose, capillary     Status: Abnormal   Collection Time: 05/20/23  4:43 PM  Result Value Ref Range   Glucose-Capillary 177 (H) 70 - 99 mg/dL    Comment: Glucose reference range applies only to samples taken after fasting for at least 8 hours.  Glucose, capillary     Status: Abnormal   Collection Time: 05/20/23  8:46 PM  Result Value Ref Range   Glucose-Capillary 163 (H) 70 - 99 mg/dL    Comment: Glucose reference range applies only to samples taken after fasting for at least 8 hours.  Glucose, capillary     Status: Abnormal   Collection Time: 05/21/23 12:25 AM  Result Value Ref Range   Glucose-Capillary 172 (H) 70 - 99 mg/dL    Comment: Glucose reference range applies only to samples taken after fasting for at least 8 hours.  Glucose, capillary     Status: Abnormal   Collection Time: 05/21/23  4:27 AM  Result Value Ref Range   Glucose-Capillary 172 (H) 70 - 99 mg/dL    Comment: Glucose reference range applies only to samples taken after fasting for at least 8 hours.  Glucose, capillary     Status: Abnormal   Collection Time: 05/21/23  7:29 AM  Result Value Ref Range   Glucose-Capillary 128 (H) 70 - 99 mg/dL    Comment: Glucose reference range applies only to samples taken after fasting for at least 8 hours.  Glucose, capillary     Status: Abnormal   Collection Time: 05/21/23 11:58 AM  Result Value Ref Range   Glucose-Capillary 242 (H) 70 - 99 mg/dL    Comment: Glucose reference range applies only to samples taken after fasting for  at least 8 hours.  Glucose, capillary     Status: Abnormal    Collection Time: 05/21/23  4:37 PM  Result Value Ref Range   Glucose-Capillary 166 (H) 70 - 99 mg/dL    Comment: Glucose reference range applies only to samples taken after fasting for at least 8 hours.  Glucose, capillary     Status: Abnormal   Collection Time: 05/21/23 10:20 PM  Result Value Ref Range   Glucose-Capillary 172 (H) 70 - 99 mg/dL    Comment: Glucose reference range applies only to samples taken after fasting for at least 8 hours.  Glucose, capillary     Status: Abnormal   Collection Time: 05/22/23  7:38 AM  Result Value Ref Range   Glucose-Capillary 159 (H) 70 - 99 mg/dL    Comment: Glucose reference range applies only to samples taken after fasting for at least 8 hours.    Imaging / Studies: VAS [place] UPPER EXTREMITY VENOUS DUPLEX Result Date: 05/21/2023 UPPER VENOUS STUDY  Patient Name:  UYEN EICHHOLZ  Date of Exam:   05/20/2023 Medical Rec #: [id number]     Accession #:    [id number] Date of Birth: 1956-02-24     Patient Gender: F Patient Age:   68 years Exam Location:  [facility] Procedure:      VAS [place] UPPER EXTREMITY VENOUS DUPLEX Referring Phys: ABIGAIL CHAVEZ --------------------------------------------------------------------------------  Indications: Swelling, and redness Risk Factors: Chemotherapy Cancer Breast Surgery Laparotomy 05/19/23 past pregnancy. Comparison Study: None. Performing Technologist: Shona Simpson  Examination Guidelines: A complete evaluation includes B-mode imaging, spectral Doppler, color Doppler, and power Doppler as needed of all accessible portions of each vessel. Bilateral testing is considered an integral part of a complete examination. Limited examinations for reoccurring indications may be performed as noted.  Right Findings: +----------+------------+---------+-----------+----------+-------+ RIGHT     CompressiblePhasicitySpontaneousPropertiesSummary +----------+------------+---------+-----------+----------+-------+ IJV           Full        Yes       Yes                      +----------+------------+---------+-----------+----------+-------+ Subclavian    Full       Yes       Yes                      +----------+------------+---------+-----------+----------+-------+ Axillary      Full       Yes       Yes                      +----------+------------+---------+-----------+----------+-------+ Brachial      Full       Yes       Yes                      +----------+------------+---------+-----------+----------+-------+ Radial        Full       Yes       Yes                      +----------+------------+---------+-----------+----------+-------+ Ulnar         Full       Yes       Yes                      +----------+------------+---------+-----------+----------+-------+ Cephalic      None  No      dilated   Acute  +----------+------------+---------+-----------+----------+-------+ Basilic       Full                 Yes                      +----------+------------+---------+-----------+----------+-------+ Occlusive thrombosis seen in the cephalic vein proximal to IV line.  Left Findings: +----------+------------+---------+-----------+------------------+-------+ LEFT      CompressiblePhasicitySpontaneous    Properties    Summary +----------+------------+---------+-----------+------------------+-------+ IJV           Full       Yes       Yes                              +----------+------------+---------+-----------+------------------+-------+ Subclavian    Full       Yes       Yes                              +----------+------------+---------+-----------+------------------+-------+ Axillary      Full       Yes       Yes                              +----------+------------+---------+-----------+------------------+-------+ Brachial      Full       Yes       Yes                               +----------+------------+---------+-----------+------------------+-------+ Radial        Full       Yes       Yes                              +----------+------------+---------+-----------+------------------+-------+ Ulnar         Full       Yes       Yes                              +----------+------------+---------+-----------+------------------+-------+ Cephalic    Partial                Yes    brightly echogenic Acute  +----------+------------+---------+-----------+------------------+-------+ Basilic       Full                 Yes                              +----------+------------+---------+-----------+------------------+-------+ Partial thrombosis seen in the Cephalic vein in the proximal forearm.  Summary:  Right: No evidence of deep vein thrombosis in the upper extremity. Findings consistent with acute superficial vein thrombosis involving the right cephalic vein.  Left: No evidence of deep vein thrombosis in the upper extremity. Findings consistent with acute superficial vein thrombosis involving the left cephalic vein.  *See table(s) above for measurements and observations.  Diagnosing physician: Carolynn Sayers Electronically signed by Carolynn Sayers on 05/21/2023 at 7:15:47 AM.    Final     Medications / Allergies: per chart  Antibiotics: Anti-infectives (From admission, onward)    Start  Dose/Rate Route Frequency Ordered Stop   05/19/23 0945  cefTRIAXone (ROCEPHIN) 2 g in sodium chloride 0.9 % 100 mL IVPB  Status:  Discontinued        2 g 200 mL/hr over 30 Minutes Intravenous  Once 05/19/23 0938 05/19/23 1149   05/15/23 2200  cefTRIAXone (ROCEPHIN) 1 g in sodium chloride 0.9 % 100 mL IVPB  Status:  Discontinued        1 g 200 mL/hr over 30 Minutes Intravenous Every 24 hours 05/15/23 0628 05/18/23 1519   05/14/23 2345  cefTRIAXone (ROCEPHIN) 1 g in sodium chloride 0.9 % 100 mL IVPB        1 g 200 mL/hr over 30 Minutes Intravenous  Once 05/14/23 2344  05/15/23 0102         Note: Portions of this report may have been transcribed using voice recognition software. Every effort was made to ensure accuracy; however, inadvertent computerized transcription errors may be present.   Any transcriptional errors that result from this process are unintentional.    Ardeth Sportsman, MD, FACS, MASCRS Esophageal, Gastrointestinal & Colorectal Surgery Robotic and Minimally Invasive Surgery  [facility] [address] [place], [place] [address] [phone] Fax [phone] Main  CONTACT INFORMATION: Weekday (9AM-5PM): Call [facility] main office at [phone] Weeknight (5PM-9AM) or Weekend/Holiday: Check EPIC "Web Links" tab & use "AMION" (password " [email or web address]") for General Surgery [facility] coverage  Please, DO NOT use SecureChat  (it is not reliable communication to reach operating surgeons & will lead to a delay in care).   Epic staff messaging available for outptient concerns needing 1-2 business day response.      05/22/2023  7:50 AM

## 2023-05-26 DIAGNOSIS — I82611 Acute embolism and thrombosis of superficial veins of right upper extremity: Secondary | ICD-10-CM | POA: Diagnosis not present

## 2023-05-26 DIAGNOSIS — Z8719 Personal history of other diseases of the digestive system: Secondary | ICD-10-CM | POA: Diagnosis not present

## 2023-05-26 DIAGNOSIS — R350 Frequency of micturition: Secondary | ICD-10-CM | POA: Diagnosis not present

## 2023-05-26 DIAGNOSIS — B379 Candidiasis, unspecified: Secondary | ICD-10-CM | POA: Diagnosis not present

## 2023-05-26 DIAGNOSIS — E1165 Type 2 diabetes mellitus with hyperglycemia: Secondary | ICD-10-CM | POA: Diagnosis not present

## 2023-05-31 ENCOUNTER — Ambulatory Visit: Payer: Medicare HMO | Attending: General Surgery

## 2023-05-31 VITALS — Wt 198.2 lb

## 2023-05-31 DIAGNOSIS — Z483 Aftercare following surgery for neoplasm: Secondary | ICD-10-CM | POA: Insufficient documentation

## 2023-05-31 NOTE — Therapy (Signed)
 OUTPATIENT PHYSICAL THERAPY SOZO SCREENING NOTE   Patient Name: [name] MRN: [id number] DOB:Oct 05, 1955, 68 y.o., female Today's Date: 05/31/2023  PCP: Tally Joe, MD REFERRING PROVIDER: [name], MD   PT End of Session - 05/31/23 1026     Visit Number 3   # unchanged due to screen only   PT Start Time 1024    PT Stop Time 1028    PT Time Calculation (min) 4 min    Activity Tolerance Patient tolerated treatment well    Behavior During Therapy [facility] for tasks assessed/performed             Past Medical History:  Diagnosis Date   Breast cancer (HCC) 10/04/2020   Cancer (HCC)    Cataract    removed   Diabetes mellitus without complication (HCC)    History of kidney stones    Hyperlipidemia    Hypertension    Personal history of chemotherapy    Personal history of radiation therapy    PONV (postoperative nausea and vomiting)    Past Surgical History:  Procedure Laterality Date   ABDOMINAL HYSTERECTOMY     BREAST BIOPSY Right 10/04/2020   BREAST LUMPECTOMY Right 01/09/2021   Procedure: incision drainage right breast abscess;  Surgeon: [name], MD;  Location: [facility];  Service: General;  Laterality: Right;   BREAST LUMPECTOMY WITH RADIOACTIVE SEED AND SENTINEL LYMPH NODE BIOPSY Right 11/05/2020   Procedure: RIGHT BREAST LUMPECTOMY WITH RADIOACTIVE SEED AND RIGHT AXILLARY SENTINEL LYMPH NODE BIOPSY;  Surgeon: [name], MD;  Location: [facility];  Service: General;  Laterality: Right;   CHOLECYSTECTOMY     IR REMOVAL TUN ACCESS W/ PORT W/O FL MOD SED  08/13/2021   KNEE SURGERY     LAPAROTOMY N/A 05/19/2023   Procedure: EXPLORATORY LAPAROTOMY, LYSIS OF ADHESIONS;  Surgeon: Griselda Miner, MD;  Location: [facility];  Service: General;  Laterality: N/A;   PORTACATH PLACEMENT Right 12/30/2020   Procedure: INSERTION PORT-A-CATH;  Surgeon: [name], MD;  Location: [facility];  Service: General;  Laterality: Right;   Patient  Active Problem List   Diagnosis Date Noted   SBO (small bowel obstruction) (HCC) 05/15/2023   Port-A-Cath in place 01/23/2021   Uncontrolled type 2 diabetes mellitus with hyperglycemia (HCC) 01/07/2021   SIRS (systemic inflammatory response syndrome) (HCC) 01/06/2021   Malignant neoplasm of upper-inner quadrant of right breast in female, estrogen receptor positive (HCC) 10/14/2020    REFERRING DIAG: right breast cancer at risk for lymphedema  THERAPY DIAG: Aftercare following surgery for neoplasm  PERTINENT HISTORY: Patient was diagnosed on 09/30/2020 with right grade I invasive lobular carcinoma breast cancer. She underwent a right lumpectomy and sentinel node biopsy (2 negative nodes) on 11/05/2020. It is ER positive, PR negative and HER2 negative with a Ki67 of 10%. Will be starting chemotherapy and then radiation.  PRECAUTIONS: right UE Lymphedema risk, None  SUBJECTIVE: Pt returns for her first 6 month L-Dex screen.   PAIN:  Are you having pain? No  SOZO SCREENING: Patient was assessed today using the SOZO machine to determine the lymphedema index score. This was compared to her baseline score. It was determined that she is within the recommended range when compared to her baseline and no further action is needed at this time. She will continue SOZO screenings. These are done every 3 months for 2 years post operatively followed by every 6 months for 2 years, and then annually.    L-DEX FLOWSHEETS - 05/31/23  1000       L-DEX LYMPHEDEMA SCREENING   Measurement Type Unilateral    L-DEX MEASUREMENT EXTREMITY Upper Extremity    POSITION  Standing    DOMINANT SIDE Right    At Risk Side Right    BASELINE SCORE (UNILATERAL) 0.7    L-DEX SCORE (UNILATERAL) 4.3    VALUE CHANGE (UNILAT) 3.6              Hermenia Bers, PTA 05/31/2023, 10:27 AM

## 2023-06-24 DIAGNOSIS — G629 Polyneuropathy, unspecified: Secondary | ICD-10-CM | POA: Diagnosis not present

## 2023-06-24 DIAGNOSIS — E1165 Type 2 diabetes mellitus with hyperglycemia: Secondary | ICD-10-CM | POA: Diagnosis not present

## 2023-06-24 DIAGNOSIS — E7800 Pure hypercholesterolemia, unspecified: Secondary | ICD-10-CM | POA: Diagnosis not present

## 2023-06-24 DIAGNOSIS — C50919 Malignant neoplasm of unspecified site of unspecified female breast: Secondary | ICD-10-CM | POA: Diagnosis not present

## 2023-06-24 DIAGNOSIS — N200 Calculus of kidney: Secondary | ICD-10-CM | POA: Diagnosis not present

## 2023-06-24 DIAGNOSIS — I10 Essential (primary) hypertension: Secondary | ICD-10-CM | POA: Diagnosis not present

## 2023-06-25 ENCOUNTER — Other Ambulatory Visit (HOSPITAL_BASED_OUTPATIENT_CLINIC_OR_DEPARTMENT_OTHER): Payer: Self-pay

## 2023-06-25 DIAGNOSIS — M4696 Unspecified inflammatory spondylopathy, lumbar region: Secondary | ICD-10-CM | POA: Diagnosis not present

## 2023-06-25 MED ORDER — METHOCARBAMOL 500 MG PO TABS
500.0000 mg | ORAL_TABLET | Freq: Four times a day (QID) | ORAL | 2 refills | Status: DC
Start: 2023-06-25 — End: 2024-01-20
  Filled 2023-06-25: qty 60, 8d supply, fill #0
  Filled 2023-08-13 – 2023-08-14 (×2): qty 60, 8d supply, fill #1

## 2023-07-06 DIAGNOSIS — M47816 Spondylosis without myelopathy or radiculopathy, lumbar region: Secondary | ICD-10-CM | POA: Diagnosis not present

## 2023-07-08 DIAGNOSIS — M47816 Spondylosis without myelopathy or radiculopathy, lumbar region: Secondary | ICD-10-CM | POA: Diagnosis not present

## 2023-07-09 ENCOUNTER — Other Ambulatory Visit (HOSPITAL_BASED_OUTPATIENT_CLINIC_OR_DEPARTMENT_OTHER): Payer: Self-pay

## 2023-08-03 DIAGNOSIS — F419 Anxiety disorder, unspecified: Secondary | ICD-10-CM | POA: Diagnosis not present

## 2023-08-03 DIAGNOSIS — I10 Essential (primary) hypertension: Secondary | ICD-10-CM | POA: Diagnosis not present

## 2023-08-03 DIAGNOSIS — G4700 Insomnia, unspecified: Secondary | ICD-10-CM | POA: Diagnosis not present

## 2023-08-03 DIAGNOSIS — G629 Polyneuropathy, unspecified: Secondary | ICD-10-CM | POA: Diagnosis not present

## 2023-08-03 DIAGNOSIS — E1165 Type 2 diabetes mellitus with hyperglycemia: Secondary | ICD-10-CM | POA: Diagnosis not present

## 2023-08-03 DIAGNOSIS — K219 Gastro-esophageal reflux disease without esophagitis: Secondary | ICD-10-CM | POA: Diagnosis not present

## 2023-08-03 DIAGNOSIS — E782 Mixed hyperlipidemia: Secondary | ICD-10-CM | POA: Diagnosis not present

## 2023-08-03 DIAGNOSIS — C50211 Malignant neoplasm of upper-inner quadrant of right female breast: Secondary | ICD-10-CM | POA: Diagnosis not present

## 2023-08-07 DIAGNOSIS — E1165 Type 2 diabetes mellitus with hyperglycemia: Secondary | ICD-10-CM | POA: Diagnosis not present

## 2023-08-13 ENCOUNTER — Telehealth: Payer: Self-pay

## 2023-08-13 NOTE — Telephone Encounter (Signed)
Pt verbally confirmed appt

## 2023-08-14 ENCOUNTER — Other Ambulatory Visit (HOSPITAL_BASED_OUTPATIENT_CLINIC_OR_DEPARTMENT_OTHER): Payer: Self-pay

## 2023-08-17 ENCOUNTER — Inpatient Hospital Stay: Payer: Medicare HMO | Attending: Hematology and Oncology | Admitting: Hematology and Oncology

## 2023-08-17 ENCOUNTER — Other Ambulatory Visit (HOSPITAL_BASED_OUTPATIENT_CLINIC_OR_DEPARTMENT_OTHER): Payer: Self-pay

## 2023-08-17 VITALS — BP 166/72 | HR 91 | Temp 98.1°F | Resp 18 | Wt 196.7 lb

## 2023-08-17 DIAGNOSIS — Z79899 Other long term (current) drug therapy: Secondary | ICD-10-CM | POA: Insufficient documentation

## 2023-08-17 DIAGNOSIS — Z1732 Human epidermal growth factor receptor 2 negative status: Secondary | ICD-10-CM | POA: Insufficient documentation

## 2023-08-17 DIAGNOSIS — Z9221 Personal history of antineoplastic chemotherapy: Secondary | ICD-10-CM | POA: Diagnosis not present

## 2023-08-17 DIAGNOSIS — Z170 Estrogen receptor positive status [ER+]: Secondary | ICD-10-CM | POA: Diagnosis not present

## 2023-08-17 DIAGNOSIS — Z1722 Progesterone receptor negative status: Secondary | ICD-10-CM | POA: Insufficient documentation

## 2023-08-17 DIAGNOSIS — J40 Bronchitis, not specified as acute or chronic: Secondary | ICD-10-CM

## 2023-08-17 DIAGNOSIS — R062 Wheezing: Secondary | ICD-10-CM | POA: Insufficient documentation

## 2023-08-17 DIAGNOSIS — Z79811 Long term (current) use of aromatase inhibitors: Secondary | ICD-10-CM | POA: Diagnosis not present

## 2023-08-17 DIAGNOSIS — Z923 Personal history of irradiation: Secondary | ICD-10-CM | POA: Insufficient documentation

## 2023-08-17 DIAGNOSIS — F40240 Claustrophobia: Secondary | ICD-10-CM | POA: Diagnosis not present

## 2023-08-17 DIAGNOSIS — G4730 Sleep apnea, unspecified: Secondary | ICD-10-CM | POA: Diagnosis not present

## 2023-08-17 DIAGNOSIS — Z87891 Personal history of nicotine dependence: Secondary | ICD-10-CM | POA: Diagnosis not present

## 2023-08-17 DIAGNOSIS — C50211 Malignant neoplasm of upper-inner quadrant of right female breast: Secondary | ICD-10-CM | POA: Insufficient documentation

## 2023-08-17 MED ORDER — BENZONATATE 200 MG PO CAPS
200.0000 mg | ORAL_CAPSULE | Freq: Three times a day (TID) | ORAL | 0 refills | Status: AC | PRN
  Filled 2023-08-17: qty 20, 7d supply, fill #0

## 2023-08-17 MED ORDER — AZITHROMYCIN 250 MG PO TABS
ORAL_TABLET | ORAL | 0 refills | Status: DC
  Filled 2023-08-17: qty 6, 5d supply, fill #0

## 2023-08-17 NOTE — Progress Notes (Signed)
 [facility]  [phone] [phone]     ID: LORIANNE MALBROUGH DOB: 03-27-1955  MR#: [id number]  YQM#:[id number]  Patient Care Team: Rae Bugler, MD as PCP - General (Family Medicine) Auther Bo, RN as Oncology Nurse Navigator Alane Hsu, RN as Oncology Nurse Navigator Enid Harry, MD as Consulting Physician (General Surgery) Colie Dawes, MD as Attending Physician (Radiation Oncology) Merryl Abraham, MD as Consulting Physician (Obstetrics and Gynecology) Tasia Farr, MD as Referring Physician (Endocrinology) Murleen Arms, MD OTHER MD:  CHIEF COMPLAINT: Estrogen receptor positive breast cancer  CURRENT TREATMENT: Anastrozole .  INTERVAL HISTORY:  History of Present Illness    Discussed the use of AI scribe software for clinical note transcription with the patient, who gave verbal consent to proceed.  History of Present Illness [name] is a 68 year old female with a history of small bowel obstruction and breast cancer who presents for follow-up.  She experienced a small bowel obstruction on May 22, 2023, attributed to scar tissue from a hysterectomy performed 25 years ago. She was hospitalized for nine days and has since been cautious with her diet. At the time of the obstruction, she was on Mounjaro , which she has resumed post-incident.  She developed a blood clot in her right arm in a superficial vein, but did not require anticoagulation therapy.  Following her hospitalization, she contracted the flu, which lasted two weeks, and subsequently developed sinus issues attributed to allergies. She has not taken antibiotics for the sinusitis.  She is currently on anastrozole  for breast cancer and tolerates it well without experiencing hot flashes or vaginal dryness. Her last bone density scan was in 2022, and she is due for another.  She has been using a treadmill for exercise but has been limited by recent illness. She  reports a persistent cough, especially at night, and has found Tessalon effective in managing it.  She has a history of sleep apnea but has stopped using her CPAP machine due to feelings of claustrophobia. She has not noticed any problems since discontinuing its use.   A detailed review of systems today was otherwise stable.  COVID 19 VACCINATION STATUS: Pfizer x3 as of July 2022   HISTORY OF CURRENT ILLNESS:  From the original intake note:  [name] had routine screening mammography on 09/04/2020 showing a possible abnormality in the right breast. She underwent right diagnostic mammography with tomography and right breast ultrasonography at [facility] on 09/30/2020 showing: breast density category B; 0.7 cm mass in right breast at 12:30; normal right axillary lymph nodes.  Accordingly on 10/04/2020 she proceeded to biopsy of the right breast area in question. The pathology from this procedure ([id number]) showed: invasive mammary carcinoma, e-cadherin negative, grade 1; atypical lobular hyperplasia. Prognostic indicators significant for: estrogen receptor, 90% positive with moderate staining intensity and progesterone receptor, 0% negative. Proliferation marker Ki67 at 10%. HER2 negative by immunohistochemistry (1+).   Cancer Staging  Malignant neoplasm of upper-inner quadrant of right breast in female, estrogen receptor positive (HCC) Staging form: Breast, AJCC 8th Edition - Clinical stage from 10/16/2020: Stage IA (cT1b, cN0, cM0, G1, ER+, PR-, HER2-) - Signed by Willy Harvest, MD on 10/16/2020 Stage prefix: Initial diagnosis Histologic grading system: 3 grade system Laterality: Right Staged by: Pathologist and managing physician Stage used in treatment planning: Yes National guidelines used in treatment planning: Yes Type of national guideline used in treatment planning: NCCN   The patient's subsequent history is as detailed  below.   PAST MEDICAL HISTORY: Past Medical  History:  Diagnosis Date   Breast cancer (HCC) 10/04/2020   Cancer (HCC)    Cataract    removed   Diabetes mellitus without complication (HCC)    History of kidney stones    Hyperlipidemia    Hypertension    Personal history of chemotherapy    Personal history of radiation therapy    PONV (postoperative nausea and vomiting)     PAST SURGICAL HISTORY: Past Surgical History:  Procedure Laterality Date   ABDOMINAL HYSTERECTOMY     BREAST BIOPSY Right 10/04/2020   BREAST LUMPECTOMY Right 01/09/2021   Procedure: incision drainage right breast abscess;  Surgeon: Enid Harry, MD;  Location: [facility];  Service: General;  Laterality: Right;   BREAST LUMPECTOMY WITH RADIOACTIVE SEED AND SENTINEL LYMPH NODE BIOPSY Right 11/05/2020   Procedure: RIGHT BREAST LUMPECTOMY WITH RADIOACTIVE SEED AND RIGHT AXILLARY SENTINEL LYMPH NODE BIOPSY;  Surgeon: Enid Harry, MD;  Location: [facility];  Service: General;  Laterality: Right;   CHOLECYSTECTOMY     IR REMOVAL TUN ACCESS W/ PORT W/O FL MOD SED  08/13/2021   KNEE SURGERY     LAPAROTOMY N/A 05/19/2023   Procedure: EXPLORATORY LAPAROTOMY, LYSIS OF ADHESIONS;  Surgeon: Caralyn Chandler, MD;  Location: [facility];  Service: General;  Laterality: N/A;   PORTACATH PLACEMENT Right 12/30/2020   Procedure: INSERTION PORT-A-CATH;  Surgeon: Enid Harry, MD;  Location[facility];  Service: General;  Laterality: Right;    FAMILY HISTORY: Family History  Problem Relation Age of Onset   Heart attack Mother    Diabetes Father    Colon cancer Maternal Grandmother    Breast cancer Cousin        dx early 69's   Her father died at age 48 from diabetes complications. Her mother died at age 93 from MI. [place] has two brothers (and no sisters). She reports breast cancer in a maternal cousin in her early 78's and colon cancer in a maternal grandmother.   GYNECOLOGIC HISTORY:  No LMP recorded. Patient has had a hysterectomy. Menarche: 68  years old Age at first live birth: 68 years old GX P 3 LMP 02/2000 Contraceptive: used from 63 HRT never used  Hysterectomy? Yes, 02/2000 BSO? 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 Husband Ernestina Headland is a retired Scientific laboratory technician. Son Van Gelinas, age 96, is a Firefighter and youth pastor in [place]. Daughter Christine Cozier, age 97, is a high school principal in [place]. Daughter Phyllistine Breslow, age 81, is a hair stylist here in [place]. Maysa has two grandchildren. She attends a [employer].    ADVANCED DIRECTIVES: In the absence of any documentation to the contrary, the patient's spouse is their HCPOA.    HEALTH MAINTENANCE: Social History   Tobacco Use   Smoking status: Former    Current packs/day: 0.00    Average packs/day: 0.5 packs/day for 10.0 years (5.0 ttl pk-yrs)    Types: Cigarettes    Start date: 09/26/1971    Quit date: 09/25/1981    Years since quitting: 41.9   Smokeless tobacco: Never  Vaping Use   Vaping status: Never Used  Substance Use Topics   Alcohol use: Never   Drug use: Never     Colonoscopy: 2018 (Dr. Andriette Keeling)  PAP: 08/2020  Bone density: 08/2020  Allergies  Allergen Reactions   Glimepiride     Hypoglycemia     Current Outpatient Medications  Medication Sig Dispense Refill   ACCU-CHEK GUIDE test strip      acetaminophen  (TYLENOL ) 500 MG tablet Take 1,000 mg by mouth every 6 (six) hours as needed for moderate pain.     amitriptyline  (ELAVIL ) 75 MG tablet Take 75 mg by mouth at bedtime.     anastrozole  (ARIMIDEX ) 1 MG tablet TAKE 1 TABLET EVERY DAY 90 tablet 3   Blood Glucose Monitoring Suppl (ACCU-CHEK GUIDE ME) w/Device KIT      ibuprofen (ADVIL) 200 MG tablet Take 400 mg by mouth every 6 (six) hours as needed for moderate pain or headache.     Insulin  Syringe-Needle U-100 (INSULIN  SYRINGE .5CC/30GX5/16") 30G X 5/16"  0.5 ML MISC See admin instructions.     losartan  (COZAAR ) 25 MG tablet Take 25 mg by mouth daily.     methocarbamol  (ROBAXIN ) 500 MG tablet Take 1-2 tablet by mouth every six to eight hours as needed for spasms/muscle tension. 60 tablet 2   Multiple Vitamin (MULTIVITAMIN WITH MINERALS) TABS tablet Take 1 tablet by mouth daily.     NOVOLOG  FLEXPEN 100 UNIT/ML FlexPen Inject 0-5 Units into the skin See admin instructions. Per sliding scale     pantoprazole  (PROTONIX ) 40 MG tablet Take 40 mg by mouth daily.     polyethylene glycol (MIRALAX ) 17 g packet Take 17 g by mouth daily. 14 each 0   PREVIDENT 5000 DRY MOUTH 1.1 % GEL dental gel Place 1 Application onto teeth 2 (two) times daily.     sertraline  (ZOLOFT ) 100 MG tablet Take 100 mg by mouth daily.     sertraline  (ZOLOFT ) 50 MG tablet Take 50 mg by mouth daily.     simvastatin  (ZOCOR ) 40 MG tablet Take 40 mg by mouth at bedtime.     [Paused] tirzepatide  (MOUNJARO ) 5 MG/0.5ML Pen Inject 5 mg into the skin every 7 (seven) days. 2 mL 5   TOUJEO  SOLOSTAR 300 UNIT/ML Solostar Pen Inject 45 Units into the skin daily.     No current facility-administered medications for this visit.    OBJECTIVE: White woman in no acute distress  Vitals:   08/17/23 0905 08/17/23 0906  BP: (!) 173/80 (!) 166/72  Pulse: 91   Resp: 18   Temp: 98.1 F (36.7 C)   SpO2: 98%         Body mass index is 30.81 kg/m.   Wt Readings from Last 3 Encounters:  08/17/23 196 lb 11.2 oz (89.2 kg)  05/31/23 198 lb 4 oz (89.9 kg)  05/19/23 209 lb 14.1 oz (95.2 kg)   Physical Exam Constitutional:      Appearance: Normal appearance.  Chest:     Comments: Bilateral breast exam done, excellent cosmetic outcome in the right breast.  No palpable masses or regional adenopathy. Musculoskeletal:        General: No swelling or tenderness.     Cervical back: Normal range of motion and neck supple. No rigidity.  Neurological:     General: No focal deficit present.     Mental  Status: She is alert.      LAB RESULTS:  CMP     Component Value Date/Time   NA 133 (L) 05/20/2023 0618   K 3.7 05/20/2023 0618   CL 101 05/20/2023 0618   CO2 20 (L) 05/20/2023 0618   GLUCOSE 118 (H) 05/20/2023 0618   BUN 11 05/20/2023  0618   CREATININE 0.54 05/20/2023 0618   CREATININE 0.73 02/02/2022 0848   CALCIUM  8.2 (L) 05/20/2023 0618   PROT 8.6 (H) 05/14/2023 2101   ALBUMIN 4.6 05/14/2023 2101   AST 33 05/14/2023 2101   AST 21 02/02/2022 0848   ALT 35 05/14/2023 2101   ALT 23 02/02/2022 0848   ALKPHOS 104 05/14/2023 2101   BILITOT 0.9 05/14/2023 2101   BILITOT 0.3 02/02/2022 0848   GFRNONAA >60 05/20/2023 0618   GFRNONAA >60 02/02/2022 0848    No results found for: "TOTALPROTELP", "ALBUMINELP", "A1GS", "A2GS", "BETS", "BETA2SER", "GAMS", "MSPIKE", "SPEI"  Lab Results  Component Value Date   WBC 8.5 05/20/2023   NEUTROABS 2.8 02/02/2022   HGB 12.1 05/20/2023   HCT 36.3 05/20/2023   MCV 93.3 05/20/2023   PLT 198 05/20/2023    No results found for: "LABCA2"  No components found for: "WUJWJX914"  No results for input(s): "INR" in the last 168 hours.  No results found for: "LABCA2"  No results found for: "NWG956"  No results found for: "CAN125"  No results found for: "CAN153"  No results found for: "CA2729"  No components found for: "HGQUANT"  No results found for: "CEA1", "CEA" / No results found for: "CEA1", "CEA"   No results found for: "AFPTUMOR"  No results found for: "CHROMOGRNA"  No results found for: "KPAFRELGTCHN", "LAMBDASER", "KAPLAMBRATIO" (kappa/lambda light chains)  No results found for: "HGBA", "HGBA2QUANT", "HGBFQUANT", "HGBSQUAN" (Hemoglobinopathy evaluation)   No results found for: "LDH"  No results found for: "IRON", "TIBC", "IRONPCTSAT" (Iron and TIBC)  No results found for: "FERRITIN"  Urinalysis    Component Value Date/Time   COLORURINE YELLOW 05/15/2023 1341   APPEARANCEUR CLOUDY (A) 05/15/2023 1341    LABSPEC >1.046 (H) 05/15/2023 1341   PHURINE 5.0 05/15/2023 1341   GLUCOSEU NEGATIVE 05/15/2023 1341   HGBUR NEGATIVE 05/15/2023 1341   BILIRUBINUR NEGATIVE 05/15/2023 1341   KETONESUR NEGATIVE 05/15/2023 1341   PROTEINUR NEGATIVE 05/15/2023 1341   NITRITE NEGATIVE 05/15/2023 1341   LEUKOCYTESUR LARGE (A) 05/15/2023 1341    STUDIES: No results found.  ELIGIBLE FOR AVAILABLE RESEARCH PROTOCOL: no  ASSESSMENT:   67 y.o. [place] woman status post right breast upper inner quadrant biopsy 10/04/2020 for clinical T1b N0, stage IA invasive lobular carcinoma, E-cadherin negative, grade 1, estrogen receptor positive, progesterone receptor and HER2 negative, with an MIB-1 of 10%  (1) status post right lumpectomy and sentinel lymph node sampling 11/05/2020 for a PT1b pN0, stage IA invasive lobular carcinoma, grade 2  (2) Oncotype score of 32 predicts a risk of recurrence outside the breast within the next 9 years of 20% if the patient's only systemic therapy is antiestrogens for 5 years.  It also predicts significant benefit from chemotherapy.  (3) adjuvant chemotherapy with cyclophosphamide  and docetaxel  every 21 days x 4 started 12/31/2020, completed 03/03/2021  (4) completed adjuvant radiation and is now on antiestrogen therapy, anastrozole   PLAN  Assessment and Plan Assessment & Plan Breast cancer On anastrozole  with good tolerance. Mammogram due July. Bone density scan needed this year. - Order mammogram for July. - Order bone density scan.  Small bowel obstruction Attributed to scar tissue from hysterectomy.   Bronchitis Wheezing suggests bronchitis. Discussed potential chest x-ray if no improvement. - Prescribe Z-Pak for 4 days. - Prescribe Tessalon for cough. - Consider chest x-ray if symptoms do not improve after a week.  Sleep apnea Discontinued CPAP due to claustrophobia. Discussed alternative devices. - Discuss alternative sleep apnea devices  with  provider.   Total encounter time 30 minutes*   *Total Encounter Time as defined by the Centers for Medicare and Medicaid Services includes, in addition to the face-to-face time of a patient visit (documented in the note above) non-face-to-face time: obtaining and reviewing outside history, ordering and reviewing medications, tests or procedures, care coordination (communications with other health care professionals or caregivers) and documentation in the medical record.

## 2023-08-24 DIAGNOSIS — M47816 Spondylosis without myelopathy or radiculopathy, lumbar region: Secondary | ICD-10-CM | POA: Diagnosis not present

## 2023-09-09 DIAGNOSIS — E1165 Type 2 diabetes mellitus with hyperglycemia: Secondary | ICD-10-CM | POA: Diagnosis not present

## 2023-09-09 DIAGNOSIS — I10 Essential (primary) hypertension: Secondary | ICD-10-CM | POA: Diagnosis not present

## 2023-09-09 DIAGNOSIS — E1169 Type 2 diabetes mellitus with other specified complication: Secondary | ICD-10-CM | POA: Diagnosis not present

## 2023-09-10 ENCOUNTER — Other Ambulatory Visit (HOSPITAL_BASED_OUTPATIENT_CLINIC_OR_DEPARTMENT_OTHER): Payer: Self-pay

## 2023-09-10 DIAGNOSIS — M4696 Unspecified inflammatory spondylopathy, lumbar region: Secondary | ICD-10-CM | POA: Diagnosis not present

## 2023-09-10 MED ORDER — METHOCARBAMOL 500 MG PO TABS
500.0000 mg | ORAL_TABLET | Freq: Four times a day (QID) | ORAL | 2 refills | Status: DC
  Filled 2023-09-10: qty 60, 8d supply, fill #0
  Filled 2023-10-22: qty 60, 8d supply, fill #1
  Filled 2023-12-10: qty 60, 8d supply, fill #2

## 2023-09-16 DIAGNOSIS — R69 Illness, unspecified: Secondary | ICD-10-CM | POA: Diagnosis not present

## 2023-09-20 DIAGNOSIS — Z170 Estrogen receptor positive status [ER+]: Secondary | ICD-10-CM | POA: Diagnosis not present

## 2023-09-20 DIAGNOSIS — E782 Mixed hyperlipidemia: Secondary | ICD-10-CM | POA: Diagnosis not present

## 2023-09-20 DIAGNOSIS — C50211 Malignant neoplasm of upper-inner quadrant of right female breast: Secondary | ICD-10-CM | POA: Diagnosis not present

## 2023-09-20 DIAGNOSIS — M47816 Spondylosis without myelopathy or radiculopathy, lumbar region: Secondary | ICD-10-CM | POA: Diagnosis not present

## 2023-09-20 DIAGNOSIS — I10 Essential (primary) hypertension: Secondary | ICD-10-CM | POA: Diagnosis not present

## 2023-09-23 DIAGNOSIS — E1165 Type 2 diabetes mellitus with hyperglycemia: Secondary | ICD-10-CM | POA: Diagnosis not present

## 2023-09-23 DIAGNOSIS — G629 Polyneuropathy, unspecified: Secondary | ICD-10-CM | POA: Diagnosis not present

## 2023-09-23 DIAGNOSIS — E7800 Pure hypercholesterolemia, unspecified: Secondary | ICD-10-CM | POA: Diagnosis not present

## 2023-09-29 DIAGNOSIS — M47816 Spondylosis without myelopathy or radiculopathy, lumbar region: Secondary | ICD-10-CM | POA: Diagnosis not present

## 2023-09-30 DIAGNOSIS — G629 Polyneuropathy, unspecified: Secondary | ICD-10-CM | POA: Diagnosis not present

## 2023-09-30 DIAGNOSIS — E7800 Pure hypercholesterolemia, unspecified: Secondary | ICD-10-CM | POA: Diagnosis not present

## 2023-09-30 DIAGNOSIS — C50919 Malignant neoplasm of unspecified site of unspecified female breast: Secondary | ICD-10-CM | POA: Diagnosis not present

## 2023-09-30 DIAGNOSIS — N200 Calculus of kidney: Secondary | ICD-10-CM | POA: Diagnosis not present

## 2023-09-30 DIAGNOSIS — E1165 Type 2 diabetes mellitus with hyperglycemia: Secondary | ICD-10-CM | POA: Diagnosis not present

## 2023-09-30 DIAGNOSIS — I10 Essential (primary) hypertension: Secondary | ICD-10-CM | POA: Diagnosis not present

## 2023-09-30 DIAGNOSIS — E038 Other specified hypothyroidism: Secondary | ICD-10-CM | POA: Diagnosis not present

## 2023-10-08 ENCOUNTER — Encounter

## 2023-10-11 ENCOUNTER — Encounter

## 2023-10-11 ENCOUNTER — Ambulatory Visit
Admission: RE | Admit: 2023-10-11 | Discharge: 2023-10-11 | Disposition: A | Discharge status: Home / Self Care | Source: Ambulatory Visit | Attending: Hematology and Oncology | Admitting: Hematology and Oncology

## 2023-10-11 DIAGNOSIS — Z853 Personal history of malignant neoplasm of breast: Secondary | ICD-10-CM | POA: Diagnosis not present

## 2023-10-11 DIAGNOSIS — Z08 Encounter for follow-up examination after completed treatment for malignant neoplasm: Secondary | ICD-10-CM | POA: Diagnosis not present

## 2023-10-11 DIAGNOSIS — C50211 Malignant neoplasm of upper-inner quadrant of right female breast: Secondary | ICD-10-CM

## 2023-10-13 DIAGNOSIS — M47816 Spondylosis without myelopathy or radiculopathy, lumbar region: Secondary | ICD-10-CM | POA: Diagnosis not present

## 2023-10-21 DIAGNOSIS — E782 Mixed hyperlipidemia: Secondary | ICD-10-CM | POA: Diagnosis not present

## 2023-10-21 DIAGNOSIS — C50211 Malignant neoplasm of upper-inner quadrant of right female breast: Secondary | ICD-10-CM | POA: Diagnosis not present

## 2023-10-21 DIAGNOSIS — E1165 Type 2 diabetes mellitus with hyperglycemia: Secondary | ICD-10-CM | POA: Diagnosis not present

## 2023-10-21 DIAGNOSIS — E1169 Type 2 diabetes mellitus with other specified complication: Secondary | ICD-10-CM | POA: Diagnosis not present

## 2023-10-21 DIAGNOSIS — I10 Essential (primary) hypertension: Secondary | ICD-10-CM | POA: Diagnosis not present

## 2023-10-21 DIAGNOSIS — Z170 Estrogen receptor positive status [ER+]: Secondary | ICD-10-CM | POA: Diagnosis not present

## 2023-10-22 ENCOUNTER — Other Ambulatory Visit (HOSPITAL_BASED_OUTPATIENT_CLINIC_OR_DEPARTMENT_OTHER): Payer: Self-pay

## 2023-10-25 DIAGNOSIS — M47816 Spondylosis without myelopathy or radiculopathy, lumbar region: Secondary | ICD-10-CM | POA: Diagnosis not present

## 2023-11-04 DIAGNOSIS — M47816 Spondylosis without myelopathy or radiculopathy, lumbar region: Secondary | ICD-10-CM | POA: Diagnosis not present

## 2023-11-04 DIAGNOSIS — M47817 Spondylosis without myelopathy or radiculopathy, lumbosacral region: Secondary | ICD-10-CM | POA: Diagnosis not present

## 2023-11-05 DIAGNOSIS — E1165 Type 2 diabetes mellitus with hyperglycemia: Secondary | ICD-10-CM | POA: Diagnosis not present

## 2023-11-17 ENCOUNTER — Other Ambulatory Visit: Payer: Self-pay | Admitting: Hematology and Oncology

## 2023-11-21 DIAGNOSIS — C50211 Malignant neoplasm of upper-inner quadrant of right female breast: Secondary | ICD-10-CM | POA: Diagnosis not present

## 2023-11-21 DIAGNOSIS — I10 Essential (primary) hypertension: Secondary | ICD-10-CM | POA: Diagnosis not present

## 2023-11-21 DIAGNOSIS — Z170 Estrogen receptor positive status [ER+]: Secondary | ICD-10-CM | POA: Diagnosis not present

## 2023-11-21 DIAGNOSIS — E782 Mixed hyperlipidemia: Secondary | ICD-10-CM | POA: Diagnosis not present

## 2023-11-24 ENCOUNTER — Telehealth (HOSPITAL_COMMUNITY): Payer: Self-pay | Admitting: *Deleted

## 2023-11-24 DIAGNOSIS — K432 Incisional hernia without obstruction or gangrene: Secondary | ICD-10-CM | POA: Diagnosis not present

## 2023-11-24 DIAGNOSIS — Z8719 Personal history of other diseases of the digestive system: Secondary | ICD-10-CM | POA: Diagnosis not present

## 2023-11-24 DIAGNOSIS — K219 Gastro-esophageal reflux disease without esophagitis: Secondary | ICD-10-CM | POA: Diagnosis not present

## 2023-11-24 DIAGNOSIS — R1312 Dysphagia, oropharyngeal phase: Secondary | ICD-10-CM | POA: Diagnosis not present

## 2023-11-24 NOTE — Telephone Encounter (Signed)
 Attempted to contact pt at [phone] to set-up swallow test. Left VM with request for call back. Will f/u up next week if have not heard back. (AHARRIS)

## 2023-11-29 ENCOUNTER — Ambulatory Visit: Attending: General Surgery

## 2023-11-29 DIAGNOSIS — Z483 Aftercare following surgery for neoplasm: Secondary | ICD-10-CM | POA: Insufficient documentation

## 2023-12-01 ENCOUNTER — Other Ambulatory Visit (HOSPITAL_COMMUNITY): Payer: Self-pay | Admitting: Gastroenterology

## 2023-12-01 DIAGNOSIS — R059 Cough, unspecified: Secondary | ICD-10-CM

## 2023-12-01 DIAGNOSIS — R1310 Dysphagia, unspecified: Secondary | ICD-10-CM

## 2023-12-08 ENCOUNTER — Ambulatory Visit (HOSPITAL_COMMUNITY)
Admission: RE | Admit: 2023-12-08 | Discharge: 2023-12-08 | Disposition: A | Discharge status: Home / Self Care | Source: Ambulatory Visit | Attending: Gastroenterology | Admitting: Gastroenterology

## 2023-12-08 ENCOUNTER — Ambulatory Visit (HOSPITAL_COMMUNITY)
Admission: RE | Admit: 2023-12-08 | Discharge: 2023-12-08 | Disposition: A | Discharge status: Home / Self Care | Source: Ambulatory Visit | Attending: *Deleted | Admitting: *Deleted

## 2023-12-08 DIAGNOSIS — K5730 Diverticulosis of large intestine without perforation or abscess without bleeding: Secondary | ICD-10-CM | POA: Diagnosis not present

## 2023-12-08 DIAGNOSIS — E119 Type 2 diabetes mellitus without complications: Secondary | ICD-10-CM | POA: Insufficient documentation

## 2023-12-08 DIAGNOSIS — I10 Essential (primary) hypertension: Secondary | ICD-10-CM | POA: Diagnosis not present

## 2023-12-08 DIAGNOSIS — K222 Esophageal obstruction: Secondary | ICD-10-CM | POA: Insufficient documentation

## 2023-12-08 DIAGNOSIS — E785 Hyperlipidemia, unspecified: Secondary | ICD-10-CM | POA: Diagnosis not present

## 2023-12-08 DIAGNOSIS — Z853 Personal history of malignant neoplasm of breast: Secondary | ICD-10-CM | POA: Diagnosis not present

## 2023-12-08 DIAGNOSIS — R1310 Dysphagia, unspecified: Secondary | ICD-10-CM | POA: Diagnosis not present

## 2023-12-08 DIAGNOSIS — Y842 Radiological procedure and radiotherapy as the cause of abnormal reaction of the patient, or of later complication, without mention of misadventure at the time of the procedure: Secondary | ICD-10-CM | POA: Diagnosis not present

## 2023-12-08 DIAGNOSIS — R059 Cough, unspecified: Secondary | ICD-10-CM | POA: Diagnosis present

## 2023-12-08 NOTE — Progress Notes (Signed)
 Modified Barium Swallow Study  Patient Details  Name: [name] MRN: [id number] Date of Birth: 08-20-1955  Today's Date: 12/08/2023  Modified Barium Swallow completed.  Full report located under Chart Review in the Imaging Section.  History of Present Illness [name] is a 68 yo female presenting for an OP MBS due to reports of globus and coughing primarily with solids. She recently underwent esophageal dilation November 2024 with findings of esophageal stenosis secondary to radiation for breast cancer. Admitted February 2025 with SBO s/p ex lap with lysis of adhesions. PMH includes breast cancer, T2DM, diverticulosis, HLD, HTN   Clinical Impression Pt presents with functional oropharyngeal swallowing. No penetration/aspiration occurred with any consistency tested. The 13 mm barium tablet was given with thin liquids with brief retention in the proximal esophagus. This cleared when followed with puree. Ongoing SLP f/u is not necessary at this time, though recommend further esophageal assessment if pt's symptoms persist. Factors that may increase risk of adverse event in presence of aspiration Noe & Lianne 2021):  N/A  Swallow Evaluation Recommendations Recommendations: PO diet PO Diet Recommendation: Regular;Thin liquids (Level 0) Liquid Administration via: Cup;Straw Medication Administration: Whole meds with liquid Supervision: Patient able to self-feed Swallowing strategies  : Slow rate;Small bites/sips Postural changes: Position pt fully upright for meals;Stay upright 30-60 min after meals Oral care recommendations: Oral care BID (2x/day) Recommended consults: Consider esophageal assessment      Damien Blumenthal, M.A., CCC-SLP Speech Language Pathology, Acute Rehabilitation Services  Secure Chat preferred [phone]  12/08/2023,12:04 PM

## 2023-12-18 ENCOUNTER — Encounter (HOSPITAL_BASED_OUTPATIENT_CLINIC_OR_DEPARTMENT_OTHER): Payer: Self-pay

## 2023-12-20 ENCOUNTER — Other Ambulatory Visit (HOSPITAL_BASED_OUTPATIENT_CLINIC_OR_DEPARTMENT_OTHER): Payer: Self-pay

## 2023-12-21 DIAGNOSIS — Z6832 Body mass index (BMI) 32.0-32.9, adult: Secondary | ICD-10-CM | POA: Diagnosis not present

## 2023-12-21 DIAGNOSIS — E782 Mixed hyperlipidemia: Secondary | ICD-10-CM | POA: Diagnosis not present

## 2023-12-21 DIAGNOSIS — Z779 Other contact with and (suspected) exposures hazardous to health: Secondary | ICD-10-CM | POA: Diagnosis not present

## 2023-12-21 DIAGNOSIS — C50211 Malignant neoplasm of upper-inner quadrant of right female breast: Secondary | ICD-10-CM | POA: Diagnosis not present

## 2023-12-21 DIAGNOSIS — I10 Essential (primary) hypertension: Secondary | ICD-10-CM | POA: Diagnosis not present

## 2023-12-21 DIAGNOSIS — Z1272 Encounter for screening for malignant neoplasm of vagina: Secondary | ICD-10-CM | POA: Diagnosis not present

## 2023-12-21 DIAGNOSIS — Z853 Personal history of malignant neoplasm of breast: Secondary | ICD-10-CM | POA: Diagnosis not present

## 2023-12-21 DIAGNOSIS — Z170 Estrogen receptor positive status [ER+]: Secondary | ICD-10-CM | POA: Diagnosis not present

## 2023-12-21 DIAGNOSIS — E1169 Type 2 diabetes mellitus with other specified complication: Secondary | ICD-10-CM | POA: Diagnosis not present

## 2023-12-21 DIAGNOSIS — E1165 Type 2 diabetes mellitus with hyperglycemia: Secondary | ICD-10-CM | POA: Diagnosis not present

## 2023-12-24 ENCOUNTER — Ambulatory Visit: Payer: Self-pay | Admitting: General Surgery

## 2023-12-24 DIAGNOSIS — R1084 Generalized abdominal pain: Secondary | ICD-10-CM | POA: Diagnosis not present

## 2023-12-27 ENCOUNTER — Other Ambulatory Visit: Payer: Self-pay | Admitting: General Surgery

## 2023-12-27 DIAGNOSIS — R1084 Generalized abdominal pain: Secondary | ICD-10-CM

## 2023-12-31 ENCOUNTER — Ambulatory Visit
Admission: RE | Admit: 2023-12-31 | Discharge: 2023-12-31 | Disposition: A | Discharge status: Home / Self Care | Source: Ambulatory Visit | Attending: General Surgery | Admitting: General Surgery

## 2023-12-31 DIAGNOSIS — R1084 Generalized abdominal pain: Secondary | ICD-10-CM

## 2023-12-31 DIAGNOSIS — K5730 Diverticulosis of large intestine without perforation or abscess without bleeding: Secondary | ICD-10-CM | POA: Diagnosis not present

## 2024-01-03 ENCOUNTER — Ambulatory Visit

## 2024-01-14 DIAGNOSIS — M47816 Spondylosis without myelopathy or radiculopathy, lumbar region: Secondary | ICD-10-CM | POA: Diagnosis not present

## 2024-01-20 DIAGNOSIS — E1165 Type 2 diabetes mellitus with hyperglycemia: Secondary | ICD-10-CM | POA: Diagnosis not present

## 2024-01-20 DIAGNOSIS — E1169 Type 2 diabetes mellitus with other specified complication: Secondary | ICD-10-CM | POA: Diagnosis not present

## 2024-01-20 DIAGNOSIS — I10 Essential (primary) hypertension: Secondary | ICD-10-CM | POA: Diagnosis not present

## 2024-01-21 DIAGNOSIS — E782 Mixed hyperlipidemia: Secondary | ICD-10-CM | POA: Diagnosis not present

## 2024-01-21 DIAGNOSIS — E1169 Type 2 diabetes mellitus with other specified complication: Secondary | ICD-10-CM | POA: Diagnosis not present

## 2024-01-21 DIAGNOSIS — C50211 Malignant neoplasm of upper-inner quadrant of right female breast: Secondary | ICD-10-CM | POA: Diagnosis not present

## 2024-01-21 DIAGNOSIS — I10 Essential (primary) hypertension: Secondary | ICD-10-CM | POA: Diagnosis not present

## 2024-01-21 DIAGNOSIS — E1165 Type 2 diabetes mellitus with hyperglycemia: Secondary | ICD-10-CM | POA: Diagnosis not present

## 2024-01-21 DIAGNOSIS — Z170 Estrogen receptor positive status [ER+]: Secondary | ICD-10-CM | POA: Diagnosis not present

## 2024-01-21 NOTE — Pre-Procedure Instructions (Signed)
 Surgical Instructions   Your procedure is scheduled on January 31, 2024. Report to [facility] Main Entrance A at 1120 A.M., then check in with the Admitting office. Any questions or running late day of surgery: call [phone]  Questions prior to your surgery date: call [phone], Monday-Friday, 8am-4pm. If you experience any cold or flu symptoms such as cough, fever, chills, shortness of breath, etc. between now and your scheduled surgery, please notify us  at the above number.     Remember:  Do not eat after midnight the night before your surgery   You may drink clear liquids until 10:20 the morning of your surgery.   Clear liquids allowed are: Water, Non-Citrus Juices (without pulp), Carbonated Beverages, Clear Tea (no milk, honey, etc.), Black Coffee Only (NO MILK, CREAM OR POWDERED CREAMER of any kind), and Gatorade.    Take these medicines the morning of surgery with A SIP OF WATER:  pantoprazole  (PROTONIX )  sertraline  (ZOLOFT )   May take these medicines IF NEEDED: acetaminophen  (TYLENOL )  methocarbamol  (ROBAXIN )   One week prior to surgery, STOP taking any Aspirin (unless otherwise instructed by your surgeon) Aleve, Naproxen, Ibuprofen, Motrin, Advil, Goody's, BC's, all herbal medications, fish oil, and non-prescription vitamins.          WHAT DO I DO ABOUT MY DIABETES MEDICATION?   THE NIGHT BEFORE SURGERY, DO NOT take bedtime dose of Novolog  Insulin .      THE MORNING OF SURGERY, take 30 units (Half dose) of Toujeo  insulin .  The day of surgery, do not take other diabetes injectables, including Byetta (exenatide), Bydureon (exenatide ER), Victoza (liraglutide), or Trulicity (dulaglutide).  If your CBG is greater than 220 mg/dL, you may take  of your sliding scale (correction) dose of insulin . HOLD MOUNJARO  for 7 days prior to surgery. DO NOT take after 01/23/24.   HOW TO MANAGE YOUR DIABETES BEFORE AND AFTER SURGERY  Why is it important to control my blood  sugar before and after surgery? Improving blood sugar levels before and after surgery helps healing and can limit problems. A way of improving blood sugar control is eating a healthy diet by:  Eating less sugar and carbohydrates  Increasing activity/exercise  Talking with your doctor about reaching your blood sugar goals High blood sugars (greater than 180 mg/dL) can raise your risk of infections and slow your recovery, so you will need to focus on controlling your diabetes during the weeks before surgery. Make sure that the doctor who takes care of your diabetes knows about your planned surgery including the date and location.  How do I manage my blood sugar before surgery? Check your blood sugar at least 4 times a day, starting 2 days before surgery, to make sure that the level is not too high or low.  Check your blood sugar the morning of your surgery when you wake up and every 2 hours until you get to the Short Stay unit.  If your blood sugar is less than 70 mg/dL, you will need to treat for low blood sugar: Do not take insulin . Treat a low blood sugar (less than 70 mg/dL) with  cup of clear juice (cranberry or apple), 4 glucose tablets, OR glucose gel. Recheck blood sugar in 15 minutes after treatment (to make sure it is greater than 70 mg/dL). If your blood sugar is not greater than 70 mg/dL on recheck, call [phone] for further instructions. Report your blood sugar to the short stay nurse when you get to Short Stay.  If  you are admitted to the hospital after surgery: Your blood sugar will be checked by the staff and you will probably be given insulin  after surgery (instead of oral diabetes medicines) to make sure you have good blood sugar levels. The goal for blood sugar control after surgery is 80-180 mg/dL.             Do NOT Smoke (Tobacco/Vaping) for 24 hours prior to your procedure.  If you use a CPAP at night, you may bring your mask/headgear for your overnight stay.    You will be asked to remove any contacts, glasses, piercing's, hearing aid's, dentures/partials prior to surgery. Please bring cases for these items if needed.    Patients discharged the day of surgery will not be allowed to drive home, and someone needs to stay with them for 24 hours.  SURGICAL WAITING ROOM VISITATION Patients may have no more than 2 support people in the waiting area - these visitors may rotate.   Pre-op nurse will coordinate an appropriate time for 1 ADULT support person, who may not rotate, to accompany patient in pre-op.  Children under the age of 54 must have an adult with them who is not the patient and must remain in the main waiting area with an adult.  If the patient needs to stay at the hospital during part of their recovery, the visitor guidelines for inpatient rooms apply.  Please refer to the [facility] website for the visitor guidelines for any additional information.   If you received a COVID test during your pre-op visit  it is requested that you wear a mask when out in public, stay away from anyone that may not be feeling well and notify your surgeon if you develop symptoms. If you have been in contact with anyone that has tested positive in the last 10 days please notify you surgeon.      Pre-operative CHG Bathing Instructions   You can play a key role in reducing the risk of infection after surgery. Your skin needs to be as free of germs as possible. You can reduce the number of germs on your skin by washing with CHG (chlorhexidine  gluconate) soap before surgery. CHG is an antiseptic soap that kills germs and continues to kill germs even after washing.   DO NOT use if you have an allergy to chlorhexidine /CHG or antibacterial soaps. If your skin becomes reddened or irritated, stop using the CHG and notify one of our RNs at [phone].              TAKE A SHOWER THE NIGHT BEFORE SURGERY   Please keep in mind the following:  DO NOT shave, including  legs and underarms, 48 hours prior to surgery.   You may shave your face before/day of surgery.  Place clean sheets on your bed the night before surgery Use a clean washcloth (not used since being washed) for shower. DO NOT sleep with pet's night before surgery.  CHG Shower Instructions:  Wash your face and private area with normal soap. If you choose to wash your hair, wash first with your normal shampoo.  After you use shampoo/soap, rinse your hair and body thoroughly to remove shampoo/soap residue.  Turn the water OFF and apply half the bottle of CHG soap to a CLEAN washcloth.  Apply CHG soap ONLY FROM YOUR NECK DOWN TO YOUR TOES (washing for 3-5 minutes)  DO NOT use CHG soap on face, private areas, open wounds, or sores.  Pay special  attention to the area where your surgery is being performed.  If you are having back surgery, having someone wash your back for you may be helpful. Wait 2 minutes after CHG soap is applied, then you may rinse off the CHG soap.  Pat dry with a clean towel  Put on clean pajamas    Additional instructions for the day of surgery: If you choose, you may shower the morning of surgery with an antibacterial soap.  DO NOT APPLY any lotions, deodorants, cologne, or perfumes.   Do not wear jewelry or makeup Do not wear nail polish, gel polish, artificial nails, or any other type of covering on natural nails (fingers and toes) Do not bring valuables to the hospital. [facility] is not responsible for valuables/personal belongings. Put on clean/comfortable clothes.  Please brush your teeth.  Ask your nurse before applying any prescription medications to the skin.

## 2024-01-24 ENCOUNTER — Encounter (HOSPITAL_COMMUNITY)
Admission: RE | Admit: 2024-01-24 | Discharge: 2024-01-24 | Disposition: A | Discharge status: Home / Self Care | Source: Ambulatory Visit | Attending: General Surgery | Admitting: General Surgery

## 2024-01-24 ENCOUNTER — Encounter (HOSPITAL_COMMUNITY): Payer: Self-pay

## 2024-01-24 ENCOUNTER — Other Ambulatory Visit: Payer: Self-pay

## 2024-01-24 VITALS — BP 136/70 | HR 84 | Temp 98.8°F | Resp 17 | Ht 67.0 in | Wt 205.6 lb

## 2024-01-24 DIAGNOSIS — Z01818 Encounter for other preprocedural examination: Secondary | ICD-10-CM

## 2024-01-24 DIAGNOSIS — G4733 Obstructive sleep apnea (adult) (pediatric): Secondary | ICD-10-CM | POA: Diagnosis not present

## 2024-01-24 DIAGNOSIS — Z87891 Personal history of nicotine dependence: Secondary | ICD-10-CM | POA: Insufficient documentation

## 2024-01-24 DIAGNOSIS — Z853 Personal history of malignant neoplasm of breast: Secondary | ICD-10-CM | POA: Insufficient documentation

## 2024-01-24 DIAGNOSIS — I700 Atherosclerosis of aorta: Secondary | ICD-10-CM | POA: Insufficient documentation

## 2024-01-24 DIAGNOSIS — E119 Type 2 diabetes mellitus without complications: Secondary | ICD-10-CM | POA: Diagnosis not present

## 2024-01-24 DIAGNOSIS — K5730 Diverticulosis of large intestine without perforation or abscess without bleeding: Secondary | ICD-10-CM | POA: Diagnosis not present

## 2024-01-24 DIAGNOSIS — K439 Ventral hernia without obstruction or gangrene: Secondary | ICD-10-CM | POA: Diagnosis not present

## 2024-01-24 DIAGNOSIS — I10 Essential (primary) hypertension: Secondary | ICD-10-CM | POA: Diagnosis not present

## 2024-01-24 DIAGNOSIS — Z01812 Encounter for preprocedural laboratory examination: Secondary | ICD-10-CM | POA: Diagnosis not present

## 2024-01-24 DIAGNOSIS — Z90710 Acquired absence of both cervix and uterus: Secondary | ICD-10-CM | POA: Insufficient documentation

## 2024-01-24 DIAGNOSIS — E1165 Type 2 diabetes mellitus with hyperglycemia: Secondary | ICD-10-CM | POA: Diagnosis not present

## 2024-01-24 DIAGNOSIS — Z794 Long term (current) use of insulin: Secondary | ICD-10-CM | POA: Insufficient documentation

## 2024-01-24 HISTORY — DX: Gastro-esophageal reflux disease without esophagitis: K21.9

## 2024-01-24 HISTORY — DX: Sleep apnea, unspecified: G47.30

## 2024-01-24 HISTORY — DX: Anxiety disorder, unspecified: F41.9

## 2024-01-24 LAB — BASIC METABOLIC PANEL WITH GFR
Anion gap: 13 (ref 5–15)
BUN: 9 mg/dL (ref 8–23)
CO2: 29 mmol/L (ref 22–32)
Calcium: 10.1 mg/dL (ref 8.9–10.3)
Chloride: 97 mmol/L — ABNORMAL LOW (ref 98–111)
Creatinine, Ser: 0.83 mg/dL (ref 0.44–1.00)
GFR, Estimated: >60 mL/min (ref >60)
Glucose, Bld: 190 mg/dL — ABNORMAL HIGH (ref 70–99)
Potassium: 4.3 mmol/L (ref 3.5–5.1)
Sodium: 139 mmol/L (ref 135–145)

## 2024-01-24 LAB — CBC
HCT: 44.1 % (ref 36.0–46.0)
Hemoglobin: 14.5 g/dL (ref 12.0–15.0)
MCH: 29.8 pg (ref 26.0–34.0)
MCHC: 32.9 g/dL (ref 30.0–36.0)
MCV: 90.6 fL (ref 80.0–100.0)
Platelets: 252 K/uL (ref 150–400)
RBC: 4.87 MIL/uL (ref 3.87–5.11)
RDW: 12.9 % (ref 11.5–15.5)
WBC: 7 K/uL (ref 4.0–10.5)
nRBC: 0 % (ref 0.0–0.2)

## 2024-01-24 LAB — HEMOGLOBIN A1C
Hgb A1c MFr Bld: 8 % — ABNORMAL HIGH (ref 4.8–5.6)
Mean Plasma Glucose: 182.9 mg/dL

## 2024-01-24 LAB — SURGICAL PCR SCREEN
MRSA, PCR: NEGATIVE
Staphylococcus aureus: NEGATIVE

## 2024-01-24 LAB — GLUCOSE, CAPILLARY: Glucose-Capillary: 220 mg/dL — ABNORMAL HIGH (ref 70–99)

## 2024-01-24 NOTE — Progress Notes (Signed)
 PCP - Dr. Alm Rav Cardiologist - denies Endocrinologist: Dr. Lavon   Chest x-ray -  EKG - 04-25-23 Stress Test - denies ECHO - denies Cardiac Cath - denies  Sleep Study - home sleep study via [facility] ~1.5 years ago CPAP - does not use every night  Fasting Blood Sugar - 180-200 Dexcom on Left Arm  Last dose of GLP1 agonist-  01/23/24 GLP1 instructions: Hold Mounjaro  1 week  Blood Thinner Instructions: n/a Aspirin Instructions:  ERAS Protcol - clear liquids until 10:20   Patient denies shortness of breath, fever, cough and chest pain at PAT appointment

## 2024-01-25 NOTE — Progress Notes (Signed)
 Anesthesia Chart Review:  Case: [id number] Date/Time: 01/31/24 1304   Procedure: REPAIR, HERNIA, VENTRAL, LAPAROSCOPIC - LAPAROSCOPIC ASSISTED VENTRAL HERNIA REPAIR WITH MESH   Anesthesia type: General   Pre-op diagnosis: VENTRAL HERNIA   [facility] / [facility]   Surgeons: Curvin Deward MOULD, MD       DISCUSSION: Patient is a 68 year old female scheduled for the above procedure. She is s/p TAH in 2001. Admitted February 2025 with SBO, s/p exploratory lap with LOA. SHe has developed a moderate to large size ventral hernia. Above procedure planned.   History includes former smoker (quit 1983), postoperative N/V, HTN, HLD, DM2, OSA (inconsistent CPAP use), GERD, breast cancer (s/p right breast lumpectomy 11/05/2020, s/p chemoradiation; right internal jugular Port 12/30/2020; I&D + Strep agalactiae right axillary abscess in setting of chemoradiation & diverticulitis 01/09/2021), TAH/BSO (03/18/2000), SBO (s/p exploratory laparotomy, LOA 05/19/2023).  A1c 8.0%. She is on Novolog  100 unit/mL 5 units TID with meals, Toufeo 300 unit/mL 60 units Q AM, and Mounjaro . Last Mounjaro  01/23/2024 and instructed to hold a week before surgery.  Anesthesia team to evaluate on the day of surgery.   VS: BP 136/70   Pulse 84   Temp 37.1 C   Resp 17   Ht 5' 7 (1.702 m)   Wt 93.3 kg   SpO2 96%   BMI 32.20 kg/m   PROVIDERS: Seabron Lenis, MD his PCP Tommas Pears, MD is endocrinologist Loretha Ash, MD is HEM-[facility]C   LABS: Preoperative labs reviewed.  (all labs ordered are listed, but only abnormal results are displayed)  Labs Reviewed  GLUCOSE, CAPILLARY - Abnormal; Notable for the following components:      Result Value   Glucose-Capillary 220 (*)    All other components within normal limits  HEMOGLOBIN A1C - Abnormal; Notable for the following components:   Hgb A1c MFr Bld 8.0 (*)    All other components within normal limits  BASIC METABOLIC PANEL WITH GFR - Abnormal; Notable for the  following components:   Chloride 97 (*)    Glucose, Bld 190 (*)    All other components within normal limits  SURGICAL PCR SCREEN  CBC     IMAGES: CT Abd/pelvis 12/31/2023: IMPRESSION: 1. Wide-mouth ventral hernia contains fat and nonobstructed loops of bowel. 2. Colonic diverticulosis without evidence of acute diverticulitis. 3. Aortic atherosclerosis.   EKG: 05/14/2023: Sinus tachycardia at 104 bpm Baseline wander in lead(s) II III aVF Confirmed by Lorette Mayo [phone]) on 05/17/2023 12:37:18 AM  CV: N/A  Past Medical History:  Diagnosis Date   Anxiety    Breast cancer (HCC) 10/04/2020   Cancer (HCC)    Cataract    removed   Diabetes mellitus without complication (HCC)    GERD (gastroesophageal reflux disease)    History of kidney stones    Hyperlipidemia    Hypertension    Personal history of chemotherapy    Personal history of radiation therapy    PONV (postoperative nausea and vomiting)    Sleep apnea     Past Surgical History:  Procedure Laterality Date   ABDOMINAL HYSTERECTOMY     BREAST BIOPSY Right 10/04/2020   BREAST LUMPECTOMY Right 01/09/2021   Procedure: incision drainage right breast abscess;  Surgeon: Ebbie Cough, MD;  Location: [facility];  Service: General;  Laterality: Right;   BREAST LUMPECTOMY WITH RADIOACTIVE SEED AND SENTINEL LYMPH NODE BIOPSY Right 11/05/2020   Procedure: RIGHT BREAST LUMPECTOMY WITH RADIOACTIVE SEED AND RIGHT AXILLARY SENTINEL LYMPH NODE BIOPSY;  Surgeon: Ebbie Cough, MD;  Location: [facility];  Service: General;  Laterality: Right;   CHOLECYSTECTOMY     IR REMOVAL TUN ACCESS W/ PORT W/O FL MOD SED  08/13/2021   KNEE SURGERY     LAPAROTOMY N/A 05/19/2023   Procedure: EXPLORATORY LAPAROTOMY, LYSIS OF ADHESIONS;  Surgeon: Curvin Deward MOULD, MD;  Location: [facility];  Service: General;  Laterality: N/A;   PORTACATH PLACEMENT Right 12/30/2020   Procedure: INSERTION PORT-A-CATH;  Surgeon: Ebbie Cough,  MD;  Location: [facility];  Service: General;  Laterality: Right;    MEDICATIONS:  [Paused] tirzepatide  (MOUNJARO ) 5 MG/0.5ML Pen   ACCU-CHEK GUIDE test strip   acetaminophen  (TYLENOL ) 500 MG tablet   anastrozole  (ARIMIDEX ) 1 MG tablet   benzonatate  (TESSALON ) 200 MG capsule   Blood Glucose Monitoring Suppl (ACCU-CHEK GUIDE ME) w/Device KIT   Cholecalciferol  (VITAMIN D -3 PO)   Cyanocobalamin  (VITAMIN B-12 PO)   ibuprofen (ADVIL) 200 MG tablet   Insulin  Syringe-Needle U-100 (INSULIN  SYRINGE .5CC/30GX5/16) 30G X 5/16 0.5 ML MISC   losartan  (COZAAR ) 25 MG tablet   MAGnesium -Oxide 400 (240 Mg) MG tablet   Melatonin 12 MG TABS   methocarbamol  (ROBAXIN ) 500 MG tablet   Multiple Vitamin (MULTIVITAMIN WITH MINERALS) TABS tablet   NOVOLOG  FLEXPEN 100 UNIT/ML FlexPen   pantoprazole  (PROTONIX ) 40 MG tablet   polyethylene glycol (MIRALAX ) 17 g packet   sertraline  (ZOLOFT ) 100 MG tablet   simvastatin  (ZOCOR ) 40 MG tablet   TOUJEO  SOLOSTAR 300 UNIT/ML Solostar Pen   No current facility-administered medications for this encounter.    Isaiah Ruder, PA-C Surgical Short Stay/Anesthesiology [facility] Phone [phone] [facility] Phone [phone] 01/25/2024 3:35 PM

## 2024-01-25 NOTE — Anesthesia Preprocedure Evaluation (Addendum)
 Anesthesia Evaluation  Patient identified by MRN, date of birth, ID band Patient awake    Reviewed: Allergy & Precautions, NPO status , Patient's Chart, lab work & pertinent test results  History of Anesthesia Complications (+) PONV  Airway Mallampati: I  TM Distance: >3 FB Neck ROM: Full    Dental  (+) Dental Advisory Given   Pulmonary sleep apnea (no longer uses CPAP) , former smoker   breath sounds clear to auscultation       Cardiovascular hypertension, Pt. on medications (-) angina  Rhythm:Regular Rate:Normal     Neuro/Psych   Anxiety     negative neurological ROS     GI/Hepatic Neg liver ROS,GERD  Medicated and Controlled,,  Endo/Other  diabetes (glu 186k), Insulin  Dependent  Mounjaro  BMI 32  Renal/GU negative Renal ROS     Musculoskeletal   Abdominal   Peds  Hematology Hb 14.5, plt 252k   Anesthesia Other Findings H/o breast cancer  Reproductive/Obstetrics                              Anesthesia Physical Anesthesia Plan  ASA: 3  Anesthesia Plan: General   Post-op Pain Management: Tylenol  PO (pre-op)*   Induction: Intravenous  PONV Risk Score and Plan: 4 or greater and Ondansetron , Dexamethasone  and Scopolamine patch - Pre-op  Airway Management Planned: Oral ETT  Additional Equipment: None  Intra-op Plan:   Post-operative Plan: Extubation in OR  Informed Consent: I have reviewed the patients History and Physical, chart, labs and discussed the procedure including the risks, benefits and alternatives for the proposed anesthesia with the patient or authorized representative who has indicated his/her understanding and acceptance.     Dental advisory given  Plan Discussed with: CRNA and Surgeon  Anesthesia Plan Comments: (PAT note written 01/25/2024 by Allison Zelenak, PA-C.  )         Anesthesia Quick Evaluation

## 2024-01-28 NOTE — Progress Notes (Signed)
 Spoke with the pt, she will arrive on Mon at 65. NPO post midnight but can have clear liquids up to 0750.

## 2024-01-31 ENCOUNTER — Encounter (HOSPITAL_COMMUNITY)
Admission: RE | Disposition: A | Discharge status: Home / Self Care | Payer: Self-pay | Source: Home / Self Care | Attending: General Surgery

## 2024-01-31 ENCOUNTER — Ambulatory Visit (HOSPITAL_COMMUNITY): Payer: Self-pay | Admitting: Vascular Surgery

## 2024-01-31 ENCOUNTER — Other Ambulatory Visit: Payer: Self-pay

## 2024-01-31 ENCOUNTER — Ambulatory Visit (HOSPITAL_COMMUNITY): Admitting: Anesthesiology

## 2024-01-31 ENCOUNTER — Encounter (HOSPITAL_COMMUNITY): Payer: Self-pay | Admitting: General Surgery

## 2024-01-31 ENCOUNTER — Inpatient Hospital Stay (HOSPITAL_COMMUNITY)
Admission: RE | Admit: 2024-01-31 | Discharge: 2024-02-07 | DRG: 354 | Disposition: A | Discharge status: Home / Self Care | Attending: General Surgery | Admitting: General Surgery

## 2024-01-31 DIAGNOSIS — E119 Type 2 diabetes mellitus without complications: Secondary | ICD-10-CM | POA: Diagnosis present

## 2024-01-31 DIAGNOSIS — E1165 Type 2 diabetes mellitus with hyperglycemia: Secondary | ICD-10-CM | POA: Diagnosis not present

## 2024-01-31 DIAGNOSIS — K565 Intestinal adhesions [bands], unspecified as to partial versus complete obstruction: Secondary | ICD-10-CM | POA: Diagnosis present

## 2024-01-31 DIAGNOSIS — K439 Ventral hernia without obstruction or gangrene: Principal | ICD-10-CM | POA: Diagnosis present

## 2024-01-31 DIAGNOSIS — K567 Ileus, unspecified: Secondary | ICD-10-CM | POA: Diagnosis not present

## 2024-01-31 DIAGNOSIS — E7800 Pure hypercholesterolemia, unspecified: Secondary | ICD-10-CM | POA: Diagnosis present

## 2024-01-31 DIAGNOSIS — Z888 Allergy status to other drugs, medicaments and biological substances status: Secondary | ICD-10-CM | POA: Diagnosis not present

## 2024-01-31 DIAGNOSIS — I10 Essential (primary) hypertension: Secondary | ICD-10-CM | POA: Diagnosis present

## 2024-01-31 DIAGNOSIS — Z90710 Acquired absence of both cervix and uterus: Secondary | ICD-10-CM

## 2024-01-31 DIAGNOSIS — Z01818 Encounter for other preprocedural examination: Secondary | ICD-10-CM

## 2024-01-31 DIAGNOSIS — Z23 Encounter for immunization: Secondary | ICD-10-CM | POA: Diagnosis present

## 2024-01-31 DIAGNOSIS — Z7984 Long term (current) use of oral hypoglycemic drugs: Secondary | ICD-10-CM

## 2024-01-31 DIAGNOSIS — K9189 Other postprocedural complications and disorders of digestive system: Secondary | ICD-10-CM | POA: Diagnosis not present

## 2024-01-31 DIAGNOSIS — Z87891 Personal history of nicotine dependence: Secondary | ICD-10-CM

## 2024-01-31 DIAGNOSIS — Z59868 Other specified financial insecurity: Secondary | ICD-10-CM

## 2024-01-31 DIAGNOSIS — Z79899 Other long term (current) drug therapy: Secondary | ICD-10-CM

## 2024-01-31 DIAGNOSIS — Z7985 Long-term (current) use of injectable non-insulin antidiabetic drugs: Secondary | ICD-10-CM | POA: Diagnosis not present

## 2024-01-31 HISTORY — PX: VENTRAL HERNIA REPAIR: SHX424

## 2024-01-31 HISTORY — PX: INSERTION OF MESH: SHX5868

## 2024-01-31 LAB — GLUCOSE, CAPILLARY
Glucose-Capillary: 186 mg/dL — ABNORMAL HIGH (ref 70–99)
Glucose-Capillary: 217 mg/dL — ABNORMAL HIGH (ref 70–99)
Glucose-Capillary: 172 mg/dL — ABNORMAL HIGH (ref 70–99)
Glucose-Capillary: 154 mg/dL — ABNORMAL HIGH (ref 70–99)

## 2024-01-31 SURGERY — REPAIR, HERNIA, VENTRAL, LAPAROSCOPIC
Anesthesia: General

## 2024-01-31 MED ORDER — ENOXAPARIN SODIUM 30 MG/0.3ML IJ SOSY
30.0000 mg | PREFILLED_SYRINGE | INTRAMUSCULAR | Status: DC
  Administered 2024-02-01 – 2024-02-07 (×7): 30 mg via SUBCUTANEOUS
  Filled 2024-01-31 (×7): qty 0.3

## 2024-01-31 MED ORDER — OXYCODONE HCL 5 MG/5ML PO SOLN: 5.0000 mg | Freq: Once | ORAL | Status: DC | PRN

## 2024-01-31 MED ORDER — LACTATED RINGERS IV SOLN: INTRAVENOUS | Status: DC

## 2024-01-31 MED ORDER — AMISULPRIDE (ANTIEMETIC) 5 MG/2ML IV SOLN
10.0000 mg | Freq: Once | INTRAVENOUS | Status: AC
  Administered 2024-01-31: 10 mg via INTRAVENOUS

## 2024-01-31 MED ORDER — MIDAZOLAM HCL (PF) 2 MG/2ML IJ SOLN: 0.5000 mg | Freq: Once | INTRAMUSCULAR | Status: DC | PRN

## 2024-01-31 MED ORDER — BUPIVACAINE-EPINEPHRINE 0.25% -1:200000 IJ SOLN
INTRAMUSCULAR | Status: DC | PRN
  Administered 2024-01-31: 20 mL

## 2024-01-31 MED ORDER — PROPOFOL 10 MG/ML IV BOLUS
INTRAVENOUS | Status: DC | PRN
  Administered 2024-01-31: 120 mg via INTRAVENOUS

## 2024-01-31 MED ORDER — MORPHINE SULFATE (PF) 2 MG/ML IV SOLN
1.0000 mg | INTRAVENOUS | Status: DC | PRN
  Administered 2024-02-01: 2 mg via INTRAVENOUS
  Filled 2024-01-31: qty 1

## 2024-01-31 MED ORDER — SODIUM CHLORIDE 0.9 % IV SOLN: INTRAVENOUS | Status: AC

## 2024-01-31 MED ORDER — CHLORHEXIDINE GLUCONATE CLOTH 2 % EX PADS: 6.0000 | MEDICATED_PAD | Freq: Once | CUTANEOUS | Status: DC

## 2024-01-31 MED ORDER — MEPERIDINE HCL 25 MG/ML IJ SOLN: 6.2500 mg | INTRAMUSCULAR | Status: DC | PRN

## 2024-01-31 MED ORDER — CHLORHEXIDINE GLUCONATE 0.12 % MT SOLN
15.0000 mL | Freq: Once | OROMUCOSAL | Status: AC
  Administered 2024-01-31: 15 mL via OROMUCOSAL
  Filled 2024-01-31: qty 15

## 2024-01-31 MED ORDER — INSULIN ASPART 100 UNIT/ML IJ SOLN
0.0000 [IU] | Freq: Three times a day (TID) | INTRAMUSCULAR | Status: DC
  Administered 2024-01-31: 2 [IU] via SUBCUTANEOUS
  Administered 2024-02-01: 3 [IU] via SUBCUTANEOUS
  Administered 2024-02-01 (×2): 2 [IU] via SUBCUTANEOUS
  Administered 2024-02-02 (×2): 5 [IU] via SUBCUTANEOUS
  Administered 2024-02-02: 7 [IU] via SUBCUTANEOUS
  Administered 2024-02-03 (×2): 2 [IU] via SUBCUTANEOUS
  Administered 2024-02-03: 3 [IU] via SUBCUTANEOUS
  Administered 2024-02-04: 5 [IU] via SUBCUTANEOUS
  Administered 2024-02-04: 2 [IU] via SUBCUTANEOUS
  Administered 2024-02-04 – 2024-02-05 (×2): 3 [IU] via SUBCUTANEOUS
  Administered 2024-02-05: 5 [IU] via SUBCUTANEOUS
  Administered 2024-02-05: 3 [IU] via SUBCUTANEOUS
  Filled 2024-01-31: qty 2
  Filled 2024-01-31 (×2): qty 5
  Filled 2024-01-31: qty 2
  Filled 2024-01-31: qty 3
  Filled 2024-01-31: qty 5
  Filled 2024-01-31: qty 2
  Filled 2024-01-31: qty 9
  Filled 2024-01-31: qty 5
  Filled 2024-01-31 (×2): qty 2
  Filled 2024-01-31: qty 5
  Filled 2024-01-31 (×2): qty 3
  Filled 2024-01-31: qty 2
  Filled 2024-01-31 (×3): qty 3

## 2024-01-31 MED ORDER — PHENYLEPHRINE HCL-NACL 20-0.9 MG/250ML-% IV SOLN
INTRAVENOUS | Status: DC | PRN
  Administered 2024-01-31: 35 ug/min via INTRAVENOUS

## 2024-01-31 MED ORDER — ROCURONIUM BROMIDE 10 MG/ML (PF) SYRINGE
PREFILLED_SYRINGE | INTRAVENOUS | Status: DC | PRN
  Administered 2024-01-31: 20 mg via INTRAVENOUS
  Administered 2024-01-31: 60 mg via INTRAVENOUS
  Administered 2024-01-31: 10 mg via INTRAVENOUS

## 2024-01-31 MED ORDER — INSULIN ASPART 100 UNIT/ML IJ SOLN
INTRAMUSCULAR | Status: AC
  Filled 2024-01-31: qty 4

## 2024-01-31 MED ORDER — HYDROMORPHONE HCL 1 MG/ML IJ SOLN
INTRAMUSCULAR | Status: AC
  Filled 2024-01-31: qty 1

## 2024-01-31 MED ORDER — FENTANYL CITRATE (PF) 250 MCG/5ML IJ SOLN
INTRAMUSCULAR | Status: DC | PRN
  Administered 2024-01-31 (×3): 50 ug via INTRAVENOUS
  Administered 2024-01-31: 100 ug via INTRAVENOUS

## 2024-01-31 MED ORDER — AMISULPRIDE (ANTIEMETIC) 5 MG/2ML IV SOLN
INTRAVENOUS | Status: AC
Start: 2024-01-31 — End: 2024-01-31
  Filled 2024-01-31: qty 4

## 2024-01-31 MED ORDER — PANTOPRAZOLE SODIUM 40 MG PO TBEC
40.0000 mg | DELAYED_RELEASE_TABLET | Freq: Every day | ORAL | Status: DC
  Administered 2024-02-01 – 2024-02-07 (×7): 40 mg via ORAL
  Filled 2024-01-31 (×7): qty 1

## 2024-01-31 MED ORDER — ACETAMINOPHEN 500 MG PO TABS
1000.0000 mg | ORAL_TABLET | ORAL | Status: AC
  Administered 2024-01-31: 1000 mg via ORAL
  Filled 2024-01-31: qty 2

## 2024-01-31 MED ORDER — INSULIN ASPART 100 UNIT/ML IJ SOLN
0.0000 [IU] | INTRAMUSCULAR | Status: DC | PRN
  Administered 2024-01-31: 4 [IU] via SUBCUTANEOUS

## 2024-01-31 MED ORDER — LOSARTAN POTASSIUM 25 MG PO TABS
25.0000 mg | ORAL_TABLET | Freq: Every day | ORAL | Status: DC
  Administered 2024-01-31 – 2024-02-06 (×7): 25 mg via ORAL
  Filled 2024-01-31 (×7): qty 1

## 2024-01-31 MED ORDER — INFLUENZA VAC SPLIT HIGH-DOSE 0.5 ML IM SUSY
0.5000 mL | PREFILLED_SYRINGE | INTRAMUSCULAR | Status: AC
  Administered 2024-02-01: 0.5 mL via INTRAMUSCULAR
  Filled 2024-01-31: qty 0.5

## 2024-01-31 MED ORDER — GABAPENTIN 100 MG PO CAPS
100.0000 mg | ORAL_CAPSULE | ORAL | Status: AC
  Administered 2024-01-31: 100 mg via ORAL
  Filled 2024-01-31: qty 1

## 2024-01-31 MED ORDER — LIDOCAINE 2% (20 MG/ML) 5 ML SYRINGE
INTRAMUSCULAR | Status: DC | PRN
  Administered 2024-01-31: 40 mg via INTRAVENOUS

## 2024-01-31 MED ORDER — CEFAZOLIN SODIUM-DEXTROSE 2-4 GM/100ML-% IV SOLN
2.0000 g | INTRAVENOUS | Status: AC
  Administered 2024-01-31: 2 g via INTRAVENOUS
  Filled 2024-01-31: qty 100

## 2024-01-31 MED ORDER — ORAL CARE MOUTH RINSE: 15.0000 mL | Freq: Once | OROMUCOSAL | Status: AC

## 2024-01-31 MED ORDER — PROPOFOL 10 MG/ML IV BOLUS
INTRAVENOUS | Status: AC
  Filled 2024-01-31: qty 20

## 2024-01-31 MED ORDER — PHENYLEPHRINE 80 MCG/ML (10ML) SYRINGE FOR IV PUSH (FOR BLOOD PRESSURE SUPPORT)
PREFILLED_SYRINGE | INTRAVENOUS | Status: DC | PRN
  Administered 2024-01-31 (×2): 80 ug via INTRAVENOUS

## 2024-01-31 MED ORDER — ONDANSETRON 4 MG PO TBDP: 4.0000 mg | ORAL_TABLET | Freq: Four times a day (QID) | ORAL | Status: DC | PRN

## 2024-01-31 MED ORDER — BUPIVACAINE-EPINEPHRINE (PF) 0.25% -1:200000 IJ SOLN
INTRAMUSCULAR | Status: AC
  Filled 2024-01-31: qty 30

## 2024-01-31 MED ORDER — ANASTROZOLE 1 MG PO TABS
1.0000 mg | ORAL_TABLET | Freq: Every day | ORAL | Status: DC
  Administered 2024-01-31 – 2024-02-06 (×7): 1 mg via ORAL
  Filled 2024-01-31 (×7): qty 1

## 2024-01-31 MED ORDER — METHOCARBAMOL 500 MG PO TABS
500.0000 mg | ORAL_TABLET | Freq: Four times a day (QID) | ORAL | Status: DC | PRN
  Administered 2024-01-31 – 2024-02-01 (×2): 1000 mg via ORAL
  Administered 2024-02-02: 500 mg via ORAL
  Administered 2024-02-05: 1000 mg via ORAL
  Filled 2024-01-31 (×2): qty 2
  Filled 2024-01-31: qty 1
  Filled 2024-01-31: qty 2

## 2024-01-31 MED ORDER — SIMVASTATIN 20 MG PO TABS
40.0000 mg | ORAL_TABLET | Freq: Every day | ORAL | Status: DC
  Administered 2024-01-31 – 2024-02-06 (×7): 40 mg via ORAL
  Filled 2024-01-31 (×7): qty 2

## 2024-01-31 MED ORDER — SUGAMMADEX SODIUM 200 MG/2ML IV SOLN
INTRAVENOUS | Status: DC | PRN
  Administered 2024-01-31: 400 mg via INTRAVENOUS

## 2024-01-31 MED ORDER — FENTANYL CITRATE (PF) 250 MCG/5ML IJ SOLN
INTRAMUSCULAR | Status: AC
  Filled 2024-01-31: qty 5

## 2024-01-31 MED ORDER — OXYCODONE HCL 5 MG PO TABS
5.0000 mg | ORAL_TABLET | ORAL | Status: DC | PRN
  Administered 2024-01-31 – 2024-02-04 (×5): 5 mg via ORAL
  Filled 2024-01-31 (×5): qty 1

## 2024-01-31 MED ORDER — ONDANSETRON HCL 4 MG/2ML IJ SOLN
4.0000 mg | Freq: Four times a day (QID) | INTRAMUSCULAR | Status: DC | PRN
  Administered 2024-01-31 – 2024-02-02 (×4): 4 mg via INTRAVENOUS
  Filled 2024-01-31 (×4): qty 2

## 2024-01-31 MED ORDER — LACTATED RINGERS IV SOLN: INTRAVENOUS | Status: DC | PRN

## 2024-01-31 MED ORDER — HYDROMORPHONE HCL 1 MG/ML IJ SOLN
0.2500 mg | INTRAMUSCULAR | Status: DC | PRN
  Administered 2024-01-31 (×2): 0.5 mg via INTRAVENOUS

## 2024-01-31 MED ORDER — OXYCODONE HCL 5 MG PO TABS: 5.0000 mg | ORAL_TABLET | Freq: Once | ORAL | Status: DC | PRN

## 2024-01-31 MED ORDER — ONDANSETRON HCL 4 MG/2ML IJ SOLN
INTRAMUSCULAR | Status: DC | PRN
Start: 2024-01-31 — End: 2024-01-31
  Administered 2024-01-31: 4 mg via INTRAVENOUS

## 2024-01-31 MED ORDER — 0.9 % SODIUM CHLORIDE (POUR BTL) OPTIME
TOPICAL | Status: DC | PRN
  Administered 2024-01-31: 1000 mL

## 2024-01-31 MED ORDER — SERTRALINE HCL 100 MG PO TABS
100.0000 mg | ORAL_TABLET | Freq: Every morning | ORAL | Status: DC
  Administered 2024-02-01 – 2024-02-07 (×7): 100 mg via ORAL
  Filled 2024-01-31 (×7): qty 1

## 2024-01-31 MED ORDER — SCOPOLAMINE 1 MG/3DAYS TD PT72
1.0000 | MEDICATED_PATCH | TRANSDERMAL | Status: DC
  Administered 2024-01-31: 1 mg via TRANSDERMAL
  Filled 2024-01-31: qty 1

## 2024-01-31 SURGICAL SUPPLY — 53 items
BINDER ABD UNIV 10 28-50 (GAUZE/BANDAGES/DRESSINGS) IMPLANT
BIOPATCH RED 1 DISK 7.0 (GAUZE/BANDAGES/DRESSINGS) IMPLANT
BNDG GAUZE DERMACEA FLUFF 4 (GAUZE/BANDAGES/DRESSINGS) IMPLANT
CANISTER SUCTION 3000ML PPV (SUCTIONS) IMPLANT
CHLORAPREP W/TINT 26 (MISCELLANEOUS) ×1 IMPLANT
COVER SURGICAL LIGHT HANDLE (MISCELLANEOUS) ×1 IMPLANT
DERMABOND ADVANCED .7 DNX12 (GAUZE/BANDAGES/DRESSINGS) ×1 IMPLANT
DEVICE SECURE STRAP 25 ABSORB (INSTRUMENTS) ×1 IMPLANT
DEVICE TROCAR PUNCTURE CLOSURE (ENDOMECHANICALS) ×1 IMPLANT
DRAIN CHANNEL 19F RND (DRAIN) IMPLANT
DRAPE INCISE IOBAN 66X45 STRL (DRAPES) ×1 IMPLANT
DRAPE LAPAROSCOPIC ABDOMINAL (DRAPES) ×1 IMPLANT
DRSG COVADERM PLUS 2X2 (GAUZE/BANDAGES/DRESSINGS) IMPLANT
DRSG OPSITE POSTOP 4X10 (GAUZE/BANDAGES/DRESSINGS) IMPLANT
DRSG OPSITE POSTOP 4X12 (GAUZE/BANDAGES/DRESSINGS) IMPLANT
DRSG TEGADERM 4X4.75 (GAUZE/BANDAGES/DRESSINGS) IMPLANT
ELECT CAUTERY BLADE 6.4 (BLADE) ×1 IMPLANT
ELECTRODE REM PT RTRN 9FT ADLT (ELECTROSURGICAL) ×1 IMPLANT
EVACUATOR SILICONE 100CC (DRAIN) IMPLANT
GAUZE PAD ABD 8X10 STRL (GAUZE/BANDAGES/DRESSINGS) IMPLANT
GLOVE BIO SURGEON STRL SZ7.5 (GLOVE) ×1 IMPLANT
GOWN STRL REUS W/ TWL LRG LVL3 (GOWN DISPOSABLE) ×3 IMPLANT
IRRIGATION SUCT STRKRFLW 2 WTP (MISCELLANEOUS) IMPLANT
KIT BASIN OR (CUSTOM PROCEDURE TRAY) ×1 IMPLANT
KIT TURNOVER KIT B (KITS) ×1 IMPLANT
MARKER SKIN DUAL TIP RULER LAB (MISCELLANEOUS) ×1 IMPLANT
MESH OVITEX LPR PERM 15X25 4L (Mesh General) IMPLANT
NDL SPNL 22GX3.5 QUINCKE BK (NEEDLE) ×1 IMPLANT
NEEDLE SPNL 22GX3.5 QUINCKE BK (NEEDLE) ×1 IMPLANT
PAD ARMBOARD POSITIONER FOAM (MISCELLANEOUS) ×2 IMPLANT
PENCIL BUTTON HOLSTER BLD 10FT (ELECTRODE) ×1 IMPLANT
SCISSORS LAP 5X35 DISP (ENDOMECHANICALS) IMPLANT
SET TUBE SMOKE EVAC HIGH FLOW (TUBING) ×1 IMPLANT
SHEARS HARMONIC 36 ACE (MISCELLANEOUS) IMPLANT
SLEEVE Z-THREAD 5X100MM (TROCAR) ×1 IMPLANT
SOLN 0.9% NACL POUR BTL 1000ML (IV SOLUTION) ×1 IMPLANT
SOLN STERILE WATER BTL 1000 ML (IV SOLUTION) ×1 IMPLANT
STAPLER SKIN PROX 35W (STAPLE) IMPLANT
SUT ETHILON 2 0 FS 18 (SUTURE) IMPLANT
SUT MNCRL AB 4-0 PS2 18 (SUTURE) ×1 IMPLANT
SUT NOVA NAB DX-16 0-1 5-0 T12 (SUTURE) ×1 IMPLANT
SUT NOVA NAB GS-21 1 T12 (SUTURE) IMPLANT
SUT VIC AB 2-0 SH 27XBRD (SUTURE) IMPLANT
SUT VIC AB 3-0 SH 27XBRD (SUTURE) ×1 IMPLANT
TOWEL GREEN STERILE FF (TOWEL DISPOSABLE) ×1 IMPLANT
TRAY FOLEY W/BAG SLVR 16FR ST (SET/KITS/TRAYS/PACK) ×1 IMPLANT
TRAY LAPAROSCOPIC MC (CUSTOM PROCEDURE TRAY) ×1 IMPLANT
TROCAR 11X100 Z THREAD (TROCAR) IMPLANT
TROCAR BALLN 12MMX100 BLUNT (TROCAR) IMPLANT
TROCAR Z-THREAD OPTICAL 5X100M (TROCAR) ×1 IMPLANT
TUBE CONNECTING 20X1/4 (TUBING) IMPLANT
WARMER LAPAROSCOPE (MISCELLANEOUS) ×1 IMPLANT
YANKAUER SUCT BULB TIP NO VENT (SUCTIONS) IMPLANT

## 2024-01-31 NOTE — Interval H&P Note (Signed)
 History and Physical Interval Note:  01/31/2024 9:46 AM  [name]  has presented today for surgery, with the diagnosis of VENTRAL HERNIA.  The various methods of treatment have been discussed with the patient and family. After consideration of risks, benefits and other options for treatment, the patient has consented to  Procedure(s) with comments: REPAIR, HERNIA, VENTRAL, LAPAROSCOPIC (N/A) - LAPAROSCOPIC ASSISTED VENTRAL HERNIA REPAIR WITH MESH as a surgical intervention.  The patient's history has been reviewed, patient examined, no change in status, stable for surgery.  I have reviewed the patient's chart and labs.  Questions were answered to the patient's satisfaction.     [name]

## 2024-01-31 NOTE — Anesthesia Procedure Notes (Signed)
 Procedure Name: Intubation Date/Time: 01/31/2024 10:22 AM  Performed by: Arvell Edsel HERO, CRNAPre-anesthesia Checklist: Emergency Drugs available, Suction available, Patient being monitored, Timeout performed and Patient identified Patient Re-evaluated:Patient Re-evaluated prior to induction Oxygen Delivery Method: Circle system utilized Preoxygenation: Pre-oxygenation with 100% oxygen Induction Type: IV induction Ventilation: Oral airway inserted - appropriate to patient size and Two handed mask ventilation required Laryngoscope Size: Mac and 3 Grade View: Grade I Tube type: Oral Tube size: 7.0 mm Number of attempts: 1 Airway Equipment and Method: Patient positioned with wedge pillow and Stylet Placement Confirmation: positive ETCO2, breath sounds checked- equal and bilateral and ETT inserted through vocal cords under direct vision Secured at: 22 cm Tube secured with: Tape Dental Injury: Teeth and Oropharynx as per pre-operative assessment

## 2024-01-31 NOTE — Op Note (Signed)
 01/31/2024  12:45 PM  PATIENT:  [name]  68 y.o. female  PRE-OPERATIVE DIAGNOSIS:  VENTRAL HERNIA  POST-OPERATIVE DIAGNOSIS:  VENTRAL HERNIA  PROCEDURE:  Procedure(s) with comments: REPAIR, HERNIA, VENTRAL, LAPAROSCOPIC (N/A) - LAPAROSCOPIC ASSISTED VENTRAL HERNIA REPAIR WITH MESH INSERTION OF MESH (N/A)  SURGEON:  Surgeons and Role:    * Curvin Deward MOULD, MD - Primary  PHYSICIAN ASSISTANT:   ASSISTANTS: Dr. Cooper   ANESTHESIA:   local and general  EBL:  minimal   BLOOD ADMINISTERED:none  DRAINS: (1) Blake drain(s) in the subcu space   LOCAL MEDICATIONS USED:  MARCAINE      SPECIMEN:  No Specimen  DISPOSITION OF SPECIMEN:  N/A  COUNTS:  YES  TOURNIQUET:  * No tourniquets in log *  DICTATION: .Dragon Dictation  After informed consent was obtained the patient was brought to the operating room and placed in the supine position on the operating room table.  After adequate induction of general anesthesia the patient's abdomen was prepped with ChloraPrep, allowed to dry, and draped in usual sterile manner including use of an Ioban drape.  An appropriate timeout was performed.  I elected to access the abdominal cavity in the left upper quadrant.  This area was infiltrated with quarter percent Marcaine .  A 5 mm Optiview port and camera were used to bluntly dissected the layers of the abdominal wall under direct vision until access was gained to the abdominal cavity.  The abdomen was then insufflated with carbon dioxide without difficulty.  Another 5 mm port was placed in the left lower quadrant under direct vision.  A third 5 mm port was placed in the right mid abdomen under direct vision.  There were omental adhesions to the anterior abdominal wall which were taken down sharply with the harmonic scalpel.  There were 2 small loops of small bowel that were also stuck to the abdominal wall that were taken down sharply with laparoscopic scissors.  Care was taken to avoid any injury.   Once this was accomplished we could see the hernia defect.  The hernia defect measured approximately 16 cm.  I chose a 15 x 25 cm piece of Ovitex LPR mesh.  I placed eight #1 Novafil stitches at equidistant points around the edge of the mesh.  The mesh was oriented with the blue side towards the anterior abdominal wall.  Next an incision was then made along her previous midline incision with a 10 blade knife.  The incision was carried through the skin and subcutaneous tissue sharply with the electrocautery until the hernia sac was opened.  The hernia sac was excised sharply with the electrocautery.  The fascial defect was much bigger than expected and stretched all the way down close to the pubic tubercle.  I then mobilized the subcutaneous tissue off of the anterior surface of the fascia in order to mobilize it so I could bring the fascia together.  The mesh was placed into the abdominal cavity with the appropriate orientation.  I then closed the fascia of the anterior abdominal wall with 2 running #1 Novafil stitches along the upper portion of the incision and interrupted #1 Novafil stitches along the lower portion of the incision.  The abdomen was then reinsufflated.  I then used a suture passer to go through the anterior abdominal wall at sites that corresponded to the anchor stitches.  All the Novafil stitches were then cinched up and tied.  Once this was accomplished the mesh was observed to be in  good apposition to the anterior abdominal wall.  The gaps between the stitches were then filled in with a secure strap tacker.  Once this was accomplished again the mesh was appeared to be in good apposition to the anterior abdominal wall without any gaps or redundancy.  The area appeared to be hemostatic.  The rest of the abdomen was examined and no other abnormalities were noted.  The gas was then allowed to escape.  The subcutaneous tissue was drained with a 19 French round Blake drain that was brought out through  the left lower port site incision.  The subcutaneous tissue was then closed with a running 2-0 Vicryl stitch.  All of the skin incisions were closed with staples.  Sterile dressings were applied.  The drain was placed to bulb suction and there was a good seal.  The patient tolerated the procedure well.  At the end of the case all needle sponge and instrument counts were correct.  The patient was then awakened and taken to recovery in stable condition. PLAN OF CARE: Admit for overnight observation  PATIENT DISPOSITION:  PACU - hemodynamically stable.   Delay start of Pharmacological VTE agent (>24hrs) due to surgical blood loss or risk of bleeding: no

## 2024-01-31 NOTE — Transfer of Care (Signed)
 Immediate Anesthesia Transfer of Care Note  Patient: [name]  Procedure(s) Performed: REPAIR, HERNIA, VENTRAL, LAPAROSCOPIC INSERTION OF MESH  Patient Location: PACU  Anesthesia Type:General  Level of Consciousness: drowsy and patient cooperative  Airway & Oxygen Therapy: Patient Spontanous Breathing and Patient connected to face mask oxygen  Post-op Assessment: Report given to RN, Post -op Vital signs reviewed and stable, Patient moving all extremities, and Patient moving all extremities X 4  Post vital signs: Reviewed and stable  Last Vitals:  Vitals Value Taken Time  BP 189/79 01/31/24 13:00  Temp    Pulse 92 01/31/24 13:01  Resp 18 01/31/24 13:01  SpO2 93 % 01/31/24 13:01  Vitals shown include unfiled device data.  Last Pain:  Vitals:   01/31/24 0912  TempSrc:   PainSc: 5       Patients Stated Pain Goal: 3 (01/31/24 0912)  Complications: No notable events documented.

## 2024-01-31 NOTE — H&P (Signed)
 MRN: [id number] DOB: 1956/03/13 Subjective   Chief Complaint: New Problem (Umbilical hernia)   History of Present Illness: [name] is a 68 y.o. female who is seen today for small bowel obstruction. The patient is a 68 year old white female who is about 8 months out from an exploratory laparotomy with lysis of adhesions for a small bowel obstruction. Over the last couple months she has noticed some bulging of her upper abdominal wall. She also notices that her close will not fit her because of this. She does have some discomfort associated with it. She denies any nausea or vomiting. She also reports falling down some stairs back in April and is not sure if this contributed to it.    Review of Systems: A complete review of systems was obtained from the patient. I have reviewed this information and discussed as appropriate with the patient. See HPI as well for other ROS.  ROS   Medical History: History reviewed. No pertinent past medical history.  Patient Active Problem List  Diagnosis  Diabetes mellitus (CMS/HHS-HCC)  Diverticulitis  Kidney stone  Malignant neoplasm of upper-inner quadrant of right breast in female, estrogen receptor positive (CMS/HHS-HCC)  Essential hypertension  Pure hypercholesterolemia  SBO (small bowel obstruction) (CMS/HHS-HCC)  Small bowel obstruction due to adhesions (CMS/HHS-HCC)   Past Surgical History:  Procedure Laterality Date  ABDOMINAL HYSTERECTOMY  DEEP AXILLARY SENTINEL NODE BIOPSY / EXCISION Right  LAPAROSCOPIC CHOLECYSTECTOMY  MASTECTOMY PARTIAL / LUMPECTOMY Right    Allergies  Allergen Reactions  Glimepiride Other (See Comments)  Hypoglycemia   Current Outpatient Medications on File Prior to Visit  Medication Sig Dispense Refill  amitriptyline  (ELAVIL ) 25 MG tablet  CONCENTRATED insulin  glargine (TOUJEO  SOLOSTAR U-300 INSULIN ) pen injector (concentration 300 units/mL)  CONCENTRATED insulin  glargine (TOUJEO  SOLOSTAR U-300 INSULIN )  pen injector (concentration 300 units/mL) 30u  losartan  (COZAAR ) 25 MG tablet losartan  25 mg tablet TAKE 1 TABLET BY MOUTH EVERY DAY  losartan  (COZAAR ) 25 MG tablet 1 tablet  sertraline  (ZOLOFT ) 50 MG tablet  simvastatin  (ZOCOR ) 10 MG tablet Take 10 mg by mouth once daily  simvastatin  (ZOCOR ) 40 MG tablet simvastatin  40 mg tablet TAKE 1 TABLET BY MOUTH EVERY EVENING  amitriptyline  (ELAVIL ) 25 MG tablet Take 1 tablet by mouth nightly  blood glucose diagnostic (ACCU-CHEK GUIDE TEST STRIPS) test strip Accu-Chek Guide test strips USE AS DIRECTED 3 TIMES A DAY  dulaglutide (TRULICITY) 0.75 mg/0.5 mL pen injector Inject subcutaneously  insulin  GLARGINE (LANTUS ) injection (concentration 100 units/mL) Inject subcutaneously  metFORMIN (GLUCOPHAGE) 1000 MG tablet Take by mouth  metFORMIN (GLUCOPHAGE) 500 MG tablet metformin 500 mg tablet TAKE 2 TABLETS BY MOUTH TWICE A DAY   No current facility-administered medications on file prior to visit.   Family History  Problem Relation Age of Onset  Breast cancer Neg Hx    Social History   Tobacco Use  Smoking Status Never  Smokeless Tobacco Never    Social History   Socioeconomic History  Marital status: Married  Tobacco Use  Smoking status: Never  Smokeless tobacco: Never  Vaping Use  Vaping status: Never Used  Substance and Sexual Activity  Alcohol use: Not Currently   Social Drivers of Health   Financial Resource Strain: Medium Risk (10/16/2020)  Received from [facility]  Overall Financial Resource Strain (CARDIA)  Difficulty of Paying Living Expenses: Somewhat hard  Food Insecurity: No Food Insecurity (05/15/2023)  Received from [facility]  Hunger Vital Sign  Within the past 12 months, you worried that your food would  run out before you got the money to buy more.: Never true  Within the past 12 months, the food you bought just didn't last and you didn't have money to get more.: Never true  Transportation Needs: No  Transportation Needs (05/15/2023)  Received from [facility] - Transportation  Lack of Transportation (Medical): No  Lack of Transportation (Non-Medical): No  Social Connections: Socially Integrated (05/15/2023)  Received from [facility]  Social Connection and Isolation Panel  In a typical week, how many times do you talk on the phone with family, friends, or neighbors?: More than three times a week  How often do you get together with friends or relatives?: More than three times a week  How often do you attend church or religious services?: More than 4 times per year  Do you belong to any clubs or organizations such as church groups, unions, fraternal or athletic groups, or school groups?: Yes  How often do you attend meetings of the clubs or organizations you belong to?: More than 4 times per year  Are you married, widowed, divorced, separated, never married, or living with a partner?: Married  Housing Stability: Unknown (05/31/2023)  Housing Stability Vital Sign  Homeless in the Last Year: No   Objective:   Vitals:  PainSc: 0-No pain   There is no height or weight on file to calculate BMI.  Physical Exam Vitals reviewed.  Constitutional:  General: She is not in acute distress. Appearance: Normal appearance.  HENT:  Head: Normocephalic and atraumatic.  Right Ear: External ear normal.  Left Ear: External ear normal.  Nose: Nose normal.  Mouth/Throat:  Mouth: Mucous membranes are moist.  Pharynx: Oropharynx is clear.  Eyes:  General: No scleral icterus. Extraocular Movements: Extraocular movements intact.  Conjunctiva/sclera: Conjunctivae normal.  Pupils: Pupils are equal, round, and reactive to light.  Cardiovascular:  Rate and Rhythm: Normal rate and regular rhythm.  Pulses: Normal pulses.  Heart sounds: Normal heart sounds.  Pulmonary:  Effort: Pulmonary effort is normal. No respiratory distress.  Breath sounds: Normal breath sounds.  Abdominal:  General:  Bowel sounds are normal.  Palpations: Abdomen is soft.  Tenderness: There is no abdominal tenderness.  Comments: The abdomen is soft and nontender. The midline incision is healing nicely with no sign of infection. There is a bulge associated with the upper portion of the midline incision. There is no sign of bowel obstruction. It is difficult to palpate a fascial defect.  Musculoskeletal:  General: No swelling, tenderness or deformity. Normal range of motion.  Cervical back: Normal range of motion and neck supple.  Skin: General: Skin is warm and dry.  Coloration: Skin is not jaundiced.  Neurological:  General: No focal deficit present.  Mental Status: She is alert and oriented to person, place, and time.  Psychiatric:  Mood and Affect: Mood normal.  Behavior: Behavior normal.     Labs, Imaging and Diagnostic Testing:  Assessment and Plan:   Diagnoses and all orders for this visit:  Generalized abdominal pain - CT abdomen pelvis without contrast; Future    The patient is about 8 months status post exploratory laparotomy and lysis of adhesions for small bowel obstruction. At this point I am concerned that she has a moderate to large size ventral hernia. I will evaluate her abdominal wall with a CT scan of the abdomen and pelvis. Once we can confirm that she has a ventral hernia and estimate the size then we can plan for surgical repair. I  have discussed with her in detail the risks and benefits of the operation as well as some of the technical aspects including the use of mesh and the risk of chronic pain and she understands and wishes to proceed. We will call her with the results of the CT scan and then move forward with surgical scheduling.

## 2024-01-31 NOTE — Anesthesia Postprocedure Evaluation (Signed)
 Anesthesia Post Note  Patient: [name]  Procedure(s) Performed: REPAIR, HERNIA, VENTRAL, LAPAROSCOPIC INSERTION OF MESH     Patient location during evaluation: PACU Anesthesia Type: General Level of consciousness: awake and alert, patient cooperative and oriented Pain management: pain level controlled Vital Signs Assessment: post-procedure vital signs reviewed and stable Respiratory status: spontaneous breathing, nonlabored ventilation and respiratory function stable Cardiovascular status: blood pressure returned to baseline and stable Postop Assessment: no apparent nausea or vomiting Anesthetic complications: no   No notable events documented.  Last Vitals:  Vitals:   01/31/24 1445 01/31/24 1523  BP: (!) 164/74 (!) 156/57  Pulse: 98 97  Resp: 15 15  Temp:  37.1 C  SpO2: 91% 92%    Last Pain:  Vitals:   01/31/24 1523  TempSrc: Oral  PainSc: 8                  Runell Kovich,E. Ruhaan Nordahl

## 2024-02-01 ENCOUNTER — Encounter (HOSPITAL_COMMUNITY): Payer: Self-pay | Admitting: General Surgery

## 2024-02-01 LAB — GLUCOSE, CAPILLARY
Glucose-Capillary: 181 mg/dL — ABNORMAL HIGH (ref 70–99)
Glucose-Capillary: 180 mg/dL — ABNORMAL HIGH (ref 70–99)
Glucose-Capillary: 237 mg/dL — ABNORMAL HIGH (ref 70–99)
Glucose-Capillary: 217 mg/dL — ABNORMAL HIGH (ref 70–99)

## 2024-02-01 NOTE — Progress Notes (Signed)
 Patient was educated on proper JP drain care, including how to empty the bulb, measure and record output, maintain bulb compression for suction, and monitor for signs of infection (redness, swelling, warmth, increased pain, or purulent drainage). Patient was instructed to wash hands before and after handling the drain and to secure the bulb to clothing to prevent pulling. Patient verbalized understanding of all instruction and demonstrated care correctly. Emptied 40cc of bloody drainage from JP drain.

## 2024-02-01 NOTE — Plan of Care (Signed)
  Problem: Education: Goal: Knowledge of General Education information will improve Description: Including pain rating scale, medication(s)/side effects and non-pharmacologic comfort measures Outcome: Progressing   Problem: Pain Managment: Goal: General experience of comfort will improve and/or be controlled Outcome: Progressing   Problem: Activity: Goal: Risk for activity intolerance will decrease Outcome: Not Progressing   Problem: Nutrition: Goal: Adequate nutrition will be maintained Outcome: Not Progressing

## 2024-02-01 NOTE — Progress Notes (Signed)
 1 Day Post-Op   Subjective/Chief Complaint: Complains of pain. Nauseated overnight   Objective: Vital signs in last 24 hours: Temp:  [98.1 F (36.7 C)-99.8 F (37.7 C)] 98.8 F (37.1 C) (11/11 0540) Pulse Rate:  [79-100] 97 (11/11 0540) Resp:  [14-19] 18 (11/11 0540) BP: (141-191)/(54-102) 179/77 (11/11 0540) SpO2:  [91 %-100 %] 94 % (11/11 0540) Weight:  [95.3 kg] 95.3 kg (11/10 0858)    Intake/Output from previous day: 11/10 0701 - 11/11 0700 In: 1477.2 [P.O.:118; I.V.:1259.2; IV Piggyback:100] Out: 470 [Urine:400; Drains:70] Intake/Output this shift: No intake/output data recorded.  General appearance: alert and cooperative Resp: clear to auscultation bilaterally Cardio: regular rate and rhythm GI: soft, tender. Quiet. Drain output serosanguinous  Lab Results:  No results for input(s): WBC, HGB, HCT, PLT in the last 72 hours. BMET No results for input(s): NA, K, CL, CO2, GLUCOSE, BUN, CREATININE, CALCIUM  in the last 72 hours. PT/INR No results for input(s): LABPROT, INR in the last 72 hours. ABG No results for input(s): PHART, HCO3 in the last 72 hours.  Invalid input(s): PCO2, PO2  Studies/Results: No results found.  Anti-infectives: Anti-infectives (From admission, onward)    Start     Dose/Rate Route Frequency Ordered Stop   01/31/24 0930  ceFAZolin  (ANCEF ) IVPB 2g/100 mL premix        2 g 200 mL/hr over 30 Minutes Intravenous On call to O.R. 01/31/24 0846 01/31/24 1056       Assessment/Plan: s/p Procedure(s) with comments: REPAIR, HERNIA, VENTRAL, LAPAROSCOPIC (N/A) - LAPAROSCOPIC ASSISTED VENTRAL HERNIA REPAIR WITH MESH INSERTION OF MESH (N/A) POD 1 Stay on clears until bowel function returns SSI for diabetes VTE. Lovenox  Pain control OOB to chair Continue drain  LOS: 0 days    [name] 02/01/2024

## 2024-02-01 NOTE — Progress Notes (Signed)
 Patient had some difficulty waking up last night after receiving oxy. Family was concerned because they stated this was not her norm after surgery. She did get up and void at approx 2030. Needed 2 assist and a walker, and was still a bit drowsy. Patient did complain of pain and nausea following getting up. Discussed with her and her daughter about giving the oxy again or trying something else. Stated that she wanted to try the Robaxin  first and see if that would help her pain. She was given the Robaxin  and was given the zofran  for nausea. Patient rested through the night. Was more alert this morning. Able to answer questions without falling asleep. States that her pain this morning is about a 4 and requested the robaxin  again. Also states that she is a bit nauseated. Robaxin  and Zofran  given this morning.  40ml bloody drainage emptied from JP drain.

## 2024-02-02 ENCOUNTER — Ambulatory Visit: Payer: Self-pay | Admitting: General Surgery

## 2024-02-02 DIAGNOSIS — E119 Type 2 diabetes mellitus without complications: Secondary | ICD-10-CM | POA: Diagnosis present

## 2024-02-02 DIAGNOSIS — Z59868 Other specified financial insecurity: Secondary | ICD-10-CM | POA: Diagnosis not present

## 2024-02-02 DIAGNOSIS — I10 Essential (primary) hypertension: Secondary | ICD-10-CM | POA: Diagnosis present

## 2024-02-02 DIAGNOSIS — Z7985 Long-term (current) use of injectable non-insulin antidiabetic drugs: Secondary | ICD-10-CM | POA: Diagnosis not present

## 2024-02-02 DIAGNOSIS — E7800 Pure hypercholesterolemia, unspecified: Secondary | ICD-10-CM | POA: Diagnosis present

## 2024-02-02 DIAGNOSIS — Z23 Encounter for immunization: Secondary | ICD-10-CM | POA: Diagnosis present

## 2024-02-02 DIAGNOSIS — Z79899 Other long term (current) drug therapy: Secondary | ICD-10-CM | POA: Diagnosis not present

## 2024-02-02 DIAGNOSIS — Z90710 Acquired absence of both cervix and uterus: Secondary | ICD-10-CM | POA: Diagnosis not present

## 2024-02-02 DIAGNOSIS — K567 Ileus, unspecified: Secondary | ICD-10-CM | POA: Diagnosis not present

## 2024-02-02 DIAGNOSIS — K439 Ventral hernia without obstruction or gangrene: Secondary | ICD-10-CM | POA: Diagnosis present

## 2024-02-02 DIAGNOSIS — Z7984 Long term (current) use of oral hypoglycemic drugs: Secondary | ICD-10-CM | POA: Diagnosis not present

## 2024-02-02 DIAGNOSIS — Z888 Allergy status to other drugs, medicaments and biological substances status: Secondary | ICD-10-CM | POA: Diagnosis not present

## 2024-02-02 DIAGNOSIS — K565 Intestinal adhesions [bands], unspecified as to partial versus complete obstruction: Secondary | ICD-10-CM | POA: Diagnosis present

## 2024-02-02 LAB — GLUCOSE, CAPILLARY
Glucose-Capillary: 268 mg/dL — ABNORMAL HIGH (ref 70–99)
Glucose-Capillary: 325 mg/dL — ABNORMAL HIGH (ref 70–99)
Glucose-Capillary: 330 mg/dL — ABNORMAL HIGH (ref 70–99)
Glucose-Capillary: 282 mg/dL — ABNORMAL HIGH (ref 70–99)
Glucose-Capillary: 226 mg/dL — ABNORMAL HIGH (ref 70–99)

## 2024-02-02 MED ORDER — SODIUM CHLORIDE 0.9 % IV SOLN: INTRAVENOUS | Status: DC

## 2024-02-02 MED ORDER — ACETAMINOPHEN 10 MG/ML IV SOLN
1000.0000 mg | Freq: Four times a day (QID) | INTRAVENOUS | Status: AC | PRN
  Administered 2024-02-02 (×2): 1000 mg via INTRAVENOUS
  Filled 2024-02-02 (×2): qty 100

## 2024-02-02 NOTE — Progress Notes (Addendum)
 Mobility Specialist Progress Note:    02/02/24 1256  Mobility  Activity Ambulated with assistance (In hallway/ to BR)  Level of Assistance Contact guard assist, steadying assist  Assistive Device Front wheel walker  Distance Ambulated (ft) 50 ft  Activity Response Tolerated well  Mobility Referral Yes  Mobility visit 1 Mobility  Mobility Specialist Start Time (ACUTE ONLY) 1239  Mobility Specialist Stop Time (ACUTE ONLY) 1256  Mobility Specialist Time Calculation (min) (ACUTE ONLY) 17 min   Received pt in bed and agreeable to mobility. Found pt on 2 L/min O2. Pt required MinG for safety. C/o abdominal pain and nausea, otherwise tolerated well. Pt's SPO2 WFL. Distance limited d/t urine urgency. Pt voided. Pt left in bed with alarm on. Left pt on 2 L/min O2. Personal belongings and call light within reach. All needs met.   Lavanda Pollack Mobility Specialist  Please contact via [employer] or  Rehab Office [phone]

## 2024-02-02 NOTE — Inpatient Diabetes Management (Signed)
 Inpatient Diabetes Program Recommendations  AACE/ADA: New Consensus Statement on Inpatient Glycemic Control (2015)  Target Ranges:  Prepandial:   less than 140 mg/dL      Peak postprandial:   less than 180 mg/dL (1-2 hours)      Critically ill patients:  140 - 180 mg/dL   Lab Results  Component Value Date   GLUCAP 268 (H) 02/02/2024   HGBA1C 8.0 (H) 01/24/2024    Latest Reference Range & Units 02/01/24 08:10 02/01/24 12:29 02/01/24 16:55 02/01/24 20:31 02/02/24 07:52  Glucose-Capillary 70 - 99 mg/dL 818 (H) 819 (H) 762 (H) 217 (H) 268 (H)   Review of Glycemic Control  Diabetes history: DM2  Outpatient Diabetes medications:  Toujeo  30 units BID  Novolog  5 units TID  Mounjaro  5mg  weekly   Current orders for Inpatient glycemic control:  Novolog  0-9 units TID   Inpatient Diabetes Program Recommendations:   While inpatient, please consider starting Semglee  30 units daily.   Thanks,  Lavanda Search, RN, MSN, [facility]  Inpatient Diabetes Coordinator  Pager [phone] (8a-5p)

## 2024-02-02 NOTE — Plan of Care (Signed)

## 2024-02-02 NOTE — Progress Notes (Signed)
 2 Days Post-Op   Subjective/Chief Complaint: Complains of pain   Objective: Vital signs in last 24 hours: Temp:  [97.7 F (36.5 C)-98.9 F (37.2 C)] 98.9 F (37.2 C) (11/12 0533) Pulse Rate:  [90-99] 99 (11/11 2035) Resp:  [16-18] 16 (11/11 1500) BP: (123-177)/(72-79) 123/79 (11/12 0533) SpO2:  [84 %-100 %] 84 % (11/12 0533) Last BM Date : 01/30/24  Intake/Output from previous day: 11/11 0701 - 11/12 0700 In: 720 [P.O.:720] Out: 220 [Urine:200; Drains:20] Intake/Output this shift: No intake/output data recorded.  General appearance: alert and cooperative Resp: clear to auscultation bilaterally Cardio: regular rate and rhythm GI: soft, incision ok. Few bs  Lab Results:  No results for input(s): WBC, HGB, HCT, PLT in the last 72 hours. BMET No results for input(s): NA, K, CL, CO2, GLUCOSE, BUN, CREATININE, CALCIUM  in the last 72 hours. PT/INR No results for input(s): LABPROT, INR in the last 72 hours. ABG No results for input(s): PHART, HCO3 in the last 72 hours.  Invalid input(s): PCO2, PO2  Studies/Results: No results found.  Anti-infectives: Anti-infectives (From admission, onward)    Start     Dose/Rate Route Frequency Ordered Stop   01/31/24 0930  ceFAZolin  (ANCEF ) IVPB 2g/100 mL premix        2 g 200 mL/hr over 30 Minutes Intravenous On call to O.R. 01/31/24 0846 01/31/24 1056       Assessment/Plan: s/p Procedure(s) with comments: REPAIR, HERNIA, VENTRAL, LAPAROSCOPIC (N/A) - LAPAROSCOPIC ASSISTED VENTRAL HERNIA REPAIR WITH MESH INSERTION OF MESH (N/A) POD 2 VHR with mesh Ileus. May need ng if she vomits but will try to hold off SSI for diabetes until bowel function returns OOB VTE. lovenox   LOS: 0 days    [name] 02/02/2024

## 2024-02-02 NOTE — Progress Notes (Signed)
 Patient reports she has not walked outside of room but has been up to the bathroom.  Educated patient about mobilizing more often. Antiemetics given for nausea. Pt has not had BM after sx.   IV fluids restarted.  Pt up on recliner.  Complains of abdominal pain, requested tylenol .

## 2024-02-02 NOTE — Plan of Care (Signed)

## 2024-02-02 NOTE — TOC Initial Note (Signed)
 Transition of Care [facility]) - Initial/Assessment Note    Patient Details  Name: [name] MRN: [id number] Date of Birth: [birth year]  [facility] CM/SW Contact:    [name] [name] Phone Number: 02/02/2024, 8:35 AM  Clinical Narrative:                 Pt admitted from home with spouse for hernia repair. No current TOC needs, please consult as needs arise.        Patient Goals and CMS Choice            Expected Discharge Plan and Services                                              Prior Living Arrangements/Services                       Activities of Daily Living   ADL Screening (condition at time of admission) Independently performs ADLs?: Yes (appropriate for developmental age) Is the patient deaf or have difficulty hearing?: No Does the patient have difficulty seeing, even when wearing glasses/contacts?: No Does the patient have difficulty concentrating, remembering, or making decisions?: No  Permission Sought/Granted                  Emotional Assessment              Admission diagnosis:  Ventral hernia without obstruction or gangrene [K43.9] Patient Active Problem List   Diagnosis Date Noted   Ventral hernia without obstruction or gangrene 01/31/2024   SBO (small bowel obstruction) (HCC) 05/15/2023   Port-A-Cath in place 01/23/2021   Uncontrolled type 2 diabetes mellitus with hyperglycemia (HCC) 01/07/2021   SIRS (systemic inflammatory response syndrome) (HCC) 01/06/2021   Malignant neoplasm of upper-inner quadrant of right breast in female, estrogen receptor positive (HCC) 10/14/2020   PCP:  Seabron Lenis, MD Pharmacy:   [facility] [phone] [name], [place] - [address] [facility] [address] [place] [address] Phone: [phone] Fax: [phone]  [facility] - [place], [place] - [address] [place]  [address] Phone: [phone] Fax: [phone]  [facility] [address] [place] [address] Phone: [phone] Fax: [phone]  [name] [address] [place] [address] Phone: [phone] Fax: [phone]     Social Drivers of Health (SDOH) Social History: SDOH Screenings   Food Insecurity: No Food Insecurity (01/31/2024)  Housing: Low Risk  (01/31/2024)  Transportation Needs: No Transportation Needs (01/31/2024)  Utilities: Not At Risk (01/31/2024)  Financial Resource Strain: Medium Risk (10/16/2020)  Social Connections: Socially Integrated (01/31/2024)  Tobacco Use: Medium Risk (01/31/2024)   SDOH Interventions:     Readmission Risk Interventions    05/19/2023   12:39 PM  Readmission Risk Prevention Plan  Post Dischage Appt Complete  Medication Screening Complete  Transportation Screening Complete

## 2024-02-02 NOTE — Progress Notes (Addendum)
 Found pt in room without nasal cannula on, O2 checked, saturations were 87%. Advised patient she is not to remove nasal cannula. Patient placed back on 2L.  Incentive spirometer education given earlier this morning.  Patient ambulated in the hallway with O2 requiring 1 person assistance.  Tolerating clear liquid diet, no emesis but does report some nausea.  Still reports no flatus.  Patient left on the recliner, educated to call for assistance. Bed alarm in place. Call bell within reach.

## 2024-02-03 DIAGNOSIS — E1165 Type 2 diabetes mellitus with hyperglycemia: Secondary | ICD-10-CM | POA: Diagnosis not present

## 2024-02-03 LAB — BASIC METABOLIC PANEL WITH GFR
Anion gap: 10 (ref 5–15)
BUN: 13 mg/dL (ref 8–23)
CO2: 22 mmol/L (ref 22–32)
Calcium: 8.1 mg/dL — ABNORMAL LOW (ref 8.9–10.3)
Chloride: 102 mmol/L (ref 98–111)
Creatinine, Ser: 0.72 mg/dL (ref 0.44–1.00)
GFR, Estimated: >60 mL/min (ref >60)
Glucose, Bld: 306 mg/dL — ABNORMAL HIGH (ref 70–99)
Potassium: 4.4 mmol/L (ref 3.5–5.1)
Sodium: 134 mmol/L — ABNORMAL LOW (ref 135–145)

## 2024-02-03 LAB — GLUCOSE, CAPILLARY
Glucose-Capillary: 190 mg/dL — ABNORMAL HIGH (ref 70–99)
Glucose-Capillary: 223 mg/dL — ABNORMAL HIGH (ref 70–99)
Glucose-Capillary: 192 mg/dL — ABNORMAL HIGH (ref 70–99)
Glucose-Capillary: 230 mg/dL — ABNORMAL HIGH (ref 70–99)

## 2024-02-03 LAB — CBC
HCT: 24.7 % — ABNORMAL LOW (ref 36.0–46.0)
Hemoglobin: 7.9 g/dL — ABNORMAL LOW (ref 12.0–15.0)
MCH: 30.9 pg (ref 26.0–34.0)
MCHC: 32 g/dL (ref 30.0–36.0)
MCV: 96.5 fL (ref 80.0–100.0)
Platelets: 115 K/uL — ABNORMAL LOW (ref 150–400)
RBC: 2.56 MIL/uL — ABNORMAL LOW (ref 3.87–5.11)
RDW: 13.3 % (ref 11.5–15.5)
WBC: 6.3 K/uL (ref 4.0–10.5)
nRBC: 0 % (ref 0.0–0.2)

## 2024-02-03 MED ORDER — CALCIUM CARBONATE ANTACID 500 MG PO CHEW
1.0000 | CHEWABLE_TABLET | Freq: Four times a day (QID) | ORAL | Status: DC | PRN
  Administered 2024-02-03 – 2024-02-05 (×3): 200 mg via ORAL
  Filled 2024-02-03 (×3): qty 1

## 2024-02-03 NOTE — Progress Notes (Signed)
 Mobility Specialist Progress Note:    02/03/24 0939  Mobility  Activity Ambulated with assistance (In hallway)  Level of Assistance Contact guard assist, steadying assist  Assistive Device Front wheel walker  Distance Ambulated (ft) 105 ft  Activity Response Tolerated well  Mobility Referral Yes  Mobility visit 1 Mobility  Mobility Specialist Start Time (ACUTE ONLY) 0920  Mobility Specialist Stop Time (ACUTE ONLY) [id number]  Mobility Specialist Time Calculation (min) (ACUTE ONLY) 19 min   Received pt in bed and agreeable to mobility. Found pt on 2 L/min O2. Pt required MinG for safety. Pt requested to use BR prior to ambulating in hallway. C/o abdominal pain, otherwise tolerated well. Pt desat to 84%; increased O2 to 3 L/min. SPO2 WFL. Returned to room without fault. Left pt on 2 L/min O2. Pt in bed with alarm on. Personal belongings and call light within reach. All needs met.  Lavanda Pollack Mobility Specialist  Please contact via [employer] or  Rehab Office [phone]

## 2024-02-03 NOTE — Plan of Care (Signed)
  Problem: Activity: Goal: Risk for activity intolerance will decrease Outcome: Progressing   Problem: Coping: Goal: Level of anxiety will decrease Outcome: Progressing   Problem: Elimination: Goal: Will not experience complications related to urinary retention Outcome: Progressing   Problem: Pain Managment: Goal: General experience of comfort will improve and/or be controlled Outcome: Progressing   Problem: Safety: Goal: Ability to remain free from injury will improve Outcome: Progressing

## 2024-02-03 NOTE — Progress Notes (Signed)
 Text page Dr. Camellia Blush at [phone] patient request TUMS for indigestion. Pending response.

## 2024-02-03 NOTE — Progress Notes (Addendum)
 3 Days Post-Op   Subjective/Chief Complaint: Complains of pain but more manageable. Getting oob   Objective: Vital signs in last 24 hours: Temp:  [98.2 F (36.8 C)-98.6 F (37 C)] 98.6 F (37 C) (11/13 0524) Pulse Rate:  [86-104] 86 (11/13 0524) Resp:  [17] 17 (11/13 0524) BP: (125-134)/(61-69) 125/61 (11/13 0524) SpO2:  [77 %-96 %] 93 % (11/13 0524) Last BM Date : 01/30/24  Intake/Output from previous day: 11/12 0701 - 11/13 0700 In: 1231.5 [P.O.:710; I.V.:521.5] Out: 225 [Urine:150; Drains:75] Intake/Output this shift: No intake/output data recorded.  General appearance: alert and cooperative Resp: clear to auscultation bilaterally Cardio: regular rate and rhythm GI: soft, appropriately tender. Quiet. Incision ok  Lab Results:  No results for input(s): WBC, HGB, HCT, PLT in the last 72 hours. BMET No results for input(s): NA, K, CL, CO2, GLUCOSE, BUN, CREATININE, CALCIUM  in the last 72 hours. PT/INR No results for input(s): LABPROT, INR in the last 72 hours. ABG No results for input(s): PHART, HCO3 in the last 72 hours.  Invalid input(s): PCO2, PO2  Studies/Results: No results found.  Anti-infectives: Anti-infectives (From admission, onward)    Start     Dose/Rate Route Frequency Ordered Stop   01/31/24 0930  ceFAZolin  (ANCEF ) IVPB 2g/100 mL premix        2 g 200 mL/hr over 30 Minutes Intravenous On call to O.R. 01/31/24 0846 01/31/24 1056       Assessment/Plan: s/p Procedure(s) with comments: REPAIR, HERNIA, VENTRAL, LAPAROSCOPIC (N/A) - LAPAROSCOPIC ASSISTED VENTRAL HERNIA REPAIR WITH MESH INSERTION OF MESH (N/A) POD 3 Check wbc and lytes today Ambulate Pain management Await return of bowel function SSI for diabetes VTE. Lovenox  ileus  LOS: 1 day    [name] 02/03/2024

## 2024-02-03 NOTE — Inpatient Diabetes Management (Signed)
 Inpatient Diabetes Program Recommendations  AACE/ADA: New Consensus Statement on Inpatient Glycemic Control (2015)  Target Ranges:  Prepandial:   less than 140 mg/dL      Peak postprandial:   less than 180 mg/dL (1-2 hours)      Critically ill patients:  140 - 180 mg/dL   Lab Results  Component Value Date   GLUCAP 223 (H) 02/03/2024   HGBA1C 8.0 (H) 01/24/2024    Latest Reference Range & Units 02/02/24 07:52 02/02/24 12:35 02/02/24 14:06 02/02/24 17:40 02/02/24 22:17 02/03/24 07:47 02/03/24 12:11  Glucose-Capillary 70 - 99 mg/dL 731 (H) 674 (H) 669 (H) 282 (H) 226 (H) 190 (H) 223 (H)  (H): Data is abnormally high  Diabetes history: DM2   Outpatient Diabetes medications:  Toujeo  30 units BID  Novolog  5 units TID  Mounjaro  5mg  weekly    Current orders for Inpatient glycemic control:  Novolog  0-9 units TID    Inpatient Diabetes Program Recommendations:    Please consider: -Semglee  30 units daily (50% home basal dose)  Thank you, Dagoberto E. Estell Puccini, RN, MSN, CNS, CDCES  Diabetes Coordinator Inpatient Glycemic Control Team Team Pager [phone] (8am-5pm) 02/03/2024 1:57 PM

## 2024-02-04 LAB — GLUCOSE, CAPILLARY
Glucose-Capillary: 178 mg/dL — ABNORMAL HIGH (ref 70–99)
Glucose-Capillary: 264 mg/dL — ABNORMAL HIGH (ref 70–99)
Glucose-Capillary: 228 mg/dL — ABNORMAL HIGH (ref 70–99)
Glucose-Capillary: 220 mg/dL — ABNORMAL HIGH (ref 70–99)

## 2024-02-04 MED ORDER — KETOROLAC TROMETHAMINE 30 MG/ML IJ SOLN
30.0000 mg | Freq: Three times a day (TID) | INTRAMUSCULAR | Status: AC | PRN
Start: 2024-02-04 — End: 2024-02-07
  Administered 2024-02-04: 30 mg via INTRAVENOUS
  Filled 2024-02-04: qty 1

## 2024-02-04 NOTE — Progress Notes (Signed)
 Mobility Specialist Progress Note:    02/04/24 1500  Mobility  Activity Ambulated with assistance  Level of Assistance Contact guard assist, steadying assist  Assistive Device Front wheel walker  Distance Ambulated (ft) 100 ft  Activity Response Tolerated well  Mobility Referral Yes  Mobility visit 1 Mobility  Mobility Specialist Start Time (ACUTE ONLY) 1345  Mobility Specialist Stop Time (ACUTE ONLY) 1400  Mobility Specialist Time Calculation (min) (ACUTE ONLY) 15 min   Pt received in bed agreeable to mobility. No physical assistance required, contact guard for safey. Requested to use BR prior to ambulating in halls. No c/o throughout. Ambulated on 3L/min, VSS. Returned to room w/o fault. Left in bed w/ call bell and personal belongings in reach. Left on 2L/min.  Thersia Minder Mobility Specialist  Please contact vis [employer] or  Rehab Office [phone]

## 2024-02-04 NOTE — Progress Notes (Signed)
 4 Days Post-Op   Subjective/Chief Complaint: Pain is slowly improving   Objective: Vital signs in last 24 hours: Temp:  [98.3 F (36.8 C)-99.4 F (37.4 C)] 98.3 F (36.8 C) (11/14 0310) Pulse Rate:  [83-88] 83 (11/14 0310) Resp:  [14-19] 18 (11/14 0310) BP: (137-145)/(71-78) 145/75 (11/14 0310) SpO2:  [95 %-98 %] 96 % (11/14 0310) Last BM Date : 02/01/24  Intake/Output from previous day: 11/13 0701 - 11/14 0700 In: 480 [P.O.:480] Out: 745 [Urine:700; Drains:45] Intake/Output this shift: No intake/output data recorded.  General appearance: alert and cooperative Resp: clear to auscultation bilaterally Cardio: regular rate and rhythm GI: soft, few bs. Incision ok  Lab Results:  Recent Labs    02/03/24 0814  WBC 6.3  HGB 7.9*  HCT 24.7*  PLT 115*   BMET Recent Labs    02/03/24 1033  NA 134*  K 4.4  CL 102  CO2 22  GLUCOSE 306*  BUN 13  CREATININE 0.72  CALCIUM  8.1*   PT/INR No results for input(s): LABPROT, INR in the last 72 hours. ABG No results for input(s): PHART, HCO3 in the last 72 hours.  Invalid input(s): PCO2, PO2  Studies/Results: No results found.  Anti-infectives: Anti-infectives (From admission, onward)    Start     Dose/Rate Route Frequency Ordered Stop   01/31/24 0930  ceFAZolin  (ANCEF ) IVPB 2g/100 mL premix        2 g 200 mL/hr over 30 Minutes Intravenous On call to O.R. 01/31/24 0846 01/31/24 1056       Assessment/Plan: s/p Procedure(s) with comments: REPAIR, HERNIA, VENTRAL, LAPAROSCOPIC (N/A) - LAPAROSCOPIC ASSISTED VENTRAL HERNIA REPAIR WITH MESH INSERTION OF MESH (N/A) Advance diet. Allow fulls today POD 4 Ambulate Add toradol  for pain SSI for diabetes VTE. lovenox   LOS: 2 days    [name] 02/04/2024

## 2024-02-04 NOTE — Plan of Care (Signed)
 Patient weaned to 1lpm. Had verbalized SOB whenever she will walk. Health Education reinforced. Encouraged to ambulate more often. PRN pain med given x1.  Problem: Education: Goal: Knowledge of General Education information will improve Description: Including pain rating scale, medication(s)/side effects and non-pharmacologic comfort measures Outcome: Progressing   Problem: Health Behavior/Discharge Planning: Goal: Ability to manage health-related needs will improve Outcome: Progressing   Problem: Clinical Measurements: Goal: Ability to maintain clinical measurements within normal limits will improve Outcome: Progressing Goal: Will remain free from infection Outcome: Progressing Goal: Diagnostic test results will improve Outcome: Progressing Goal: Respiratory complications will improve Outcome: Progressing Goal: Cardiovascular complication will be avoided Outcome: Progressing   Problem: Activity: Goal: Risk for activity intolerance will decrease Outcome: Progressing   Problem: Nutrition: Goal: Adequate nutrition will be maintained Outcome: Progressing   Problem: Coping: Goal: Level of anxiety will decrease Outcome: Progressing   Problem: Elimination: Goal: Will not experience complications related to bowel motility Outcome: Progressing Goal: Will not experience complications related to urinary retention Outcome: Progressing   Problem: Pain Managment: Goal: General experience of comfort will improve and/or be controlled Outcome: Progressing   Problem: Safety: Goal: Ability to remain free from injury will improve Outcome: Progressing   Problem: Skin Integrity: Goal: Risk for impaired skin integrity will decrease Outcome: Progressing   Problem: Education: Goal: Ability to describe self-care measures that may prevent or decrease complications (Diabetes Survival Skills Education) will improve Outcome: Progressing Goal: Individualized Educational Video(s) Outcome:  Progressing   Problem: Coping: Goal: Ability to adjust to condition or change in health will improve Outcome: Progressing   Problem: Fluid Volume: Goal: Ability to maintain a balanced intake and output will improve Outcome: Progressing   Problem: Health Behavior/Discharge Planning: Goal: Ability to identify and utilize available resources and services will improve Outcome: Progressing Goal: Ability to manage health-related needs will improve Outcome: Progressing   Problem: Metabolic: Goal: Ability to maintain appropriate glucose levels will improve Outcome: Progressing   Problem: Nutritional: Goal: Maintenance of adequate nutrition will improve Outcome: Progressing Goal: Progress toward achieving an optimal weight will improve Outcome: Progressing   Problem: Skin Integrity: Goal: Risk for impaired skin integrity will decrease Outcome: Progressing   Problem: Tissue Perfusion: Goal: Adequacy of tissue perfusion will improve Outcome: Progressing

## 2024-02-05 LAB — GLUCOSE, CAPILLARY
Glucose-Capillary: 213 mg/dL — ABNORMAL HIGH (ref 70–99)
Glucose-Capillary: 276 mg/dL — ABNORMAL HIGH (ref 70–99)
Glucose-Capillary: 199 mg/dL — ABNORMAL HIGH (ref 70–99)
Glucose-Capillary: 263 mg/dL — ABNORMAL HIGH (ref 70–99)

## 2024-02-05 MED ORDER — INSULIN ASPART 100 UNIT/ML IJ SOLN
0.0000 [IU] | Freq: Every day | INTRAMUSCULAR | Status: DC
  Administered 2024-02-06 (×2): 2 [IU] via SUBCUTANEOUS
  Filled 2024-02-05 (×2): qty 2

## 2024-02-05 MED ORDER — POLYETHYLENE GLYCOL 3350 17 G PO PACK
17.0000 g | PACK | Freq: Every day | ORAL | Status: DC
  Administered 2024-02-05 – 2024-02-07 (×3): 17 g via ORAL
  Filled 2024-02-05 (×3): qty 1

## 2024-02-05 MED ORDER — INSULIN ASPART 100 UNIT/ML IJ SOLN
0.0000 [IU] | Freq: Three times a day (TID) | INTRAMUSCULAR | Status: DC
  Administered 2024-02-06 (×2): 5 [IU] via SUBCUTANEOUS
  Administered 2024-02-06 – 2024-02-07 (×2): 3 [IU] via SUBCUTANEOUS
  Filled 2024-02-05: qty 3
  Filled 2024-02-05: qty 5

## 2024-02-05 NOTE — Progress Notes (Signed)
    Assessment & Plan: POD#5 - s/p LAPAROSCOPIC VENTRAL HERNIA REPAIR WITH MESH - advance to regular diet today - Miralax  - ambulate with mobility specialist - SSI for diabetes - monitor drain output  Doing well.  Less pain.  Still needs to mobilize more.  Advance diet.  Home 1-2 days.        Krystal Spinner, MD [facility] practice Office: [phone]        Chief Complaint: Ventral hernia  Subjective: Patient in bed, comfortable.  Would like laxative.  Would like to advance diet.  Objective: Vital signs in last 24 hours: Temp:  [98.1 F (36.7 C)-99.2 F (37.3 C)] 98.7 F (37.1 C) (11/15 0610) Pulse Rate:  [87-90] 90 (11/15 0610) Resp:  [17] 17 (11/14 1733) BP: (125-142)/(46-83) 132/83 (11/15 0610) SpO2:  [84 %-96 %] 96 % (11/15 0610) Last BM Date : 01/31/24  Intake/Output from previous day: 11/14 0701 - 11/15 0700 In: 960 [P.O.:960] Out: 165 [Urine:100; Drains:65] Intake/Output this shift: No intake/output data recorded.  Physical Exam: HEENT - sclerae clear, mucous membranes moist Neck - soft Abdomen - soft; midline wound dry and intact with honeycomb dressing; JP with serous output  Lab Results:  Recent Labs    02/03/24 0814  WBC 6.3  HGB 7.9*  HCT 24.7*  PLT 115*   BMET Recent Labs    02/03/24 1033  NA 134*  K 4.4  CL 102  CO2 22  GLUCOSE 306*  BUN 13  CREATININE 0.72  CALCIUM  8.1*   PT/INR No results for input(s): LABPROT, INR in the last 72 hours. Comprehensive Metabolic Panel:    Component Value Date/Time   NA 134 (L) 02/03/2024 1033   NA 139 01/24/2024 1200   K 4.4 02/03/2024 1033   K 4.3 01/24/2024 1200   CL 102 02/03/2024 1033   CL 97 (L) 01/24/2024 1200   CO2 22 02/03/2024 1033   CO2 29 01/24/2024 1200   BUN 13 02/03/2024 1033   BUN 9 01/24/2024 1200   CREATININE 0.72 02/03/2024 1033   CREATININE 0.83 01/24/2024 1200   CREATININE 0.73 02/02/2022 0848   CREATININE 0.76 12/31/2020 0750    GLUCOSE 306 (H) 02/03/2024 1033   GLUCOSE 190 (H) 01/24/2024 1200   CALCIUM  8.1 (L) 02/03/2024 1033   CALCIUM  10.1 01/24/2024 1200   AST 33 05/14/2023 2101   AST 21 02/02/2022 0848   AST 21 05/05/2021 1050   AST 24 12/31/2020 0750   ALT 35 05/14/2023 2101   ALT 23 02/02/2022 0848   ALT 21 05/05/2021 1050   ALT 25 12/31/2020 0750   ALKPHOS 104 05/14/2023 2101   ALKPHOS 69 02/02/2022 0848   BILITOT 0.9 05/14/2023 2101   BILITOT 0.3 02/02/2022 0848   BILITOT 0.3 05/05/2021 1050   BILITOT 0.3 12/31/2020 0750   PROT 8.6 (H) 05/14/2023 2101   PROT 7.2 02/02/2022 0848   ALBUMIN 4.6 05/14/2023 2101   ALBUMIN 4.2 02/02/2022 0848    Studies/Results: No results found.    Krystal Spinner 02/05/2024  Patient ID: [name], [name]   DOB: Feb 04, 1956, 68 y.o.   MRN: [id number]

## 2024-02-05 NOTE — Plan of Care (Signed)
 Ambulated in the hallway. Bath given. PRN tums given. Still needs encouragement for walking. Problem: Education: Goal: Knowledge of General Education information will improve Description: Including pain rating scale, medication(s)/side effects and non-pharmacologic comfort measures Outcome: Progressing   Problem: Health Behavior/Discharge Planning: Goal: Ability to manage health-related needs will improve Outcome: Progressing   Problem: Clinical Measurements: Goal: Ability to maintain clinical measurements within normal limits will improve Outcome: Progressing Goal: Will remain free from infection Outcome: Progressing Goal: Diagnostic test results will improve Outcome: Progressing Goal: Respiratory complications will improve Outcome: Progressing Goal: Cardiovascular complication will be avoided Outcome: Progressing   Problem: Activity: Goal: Risk for activity intolerance will decrease Outcome: Progressing   Problem: Nutrition: Goal: Adequate nutrition will be maintained Outcome: Progressing   Problem: Coping: Goal: Level of anxiety will decrease Outcome: Progressing   Problem: Elimination: Goal: Will not experience complications related to bowel motility Outcome: Progressing Goal: Will not experience complications related to urinary retention Outcome: Progressing   Problem: Pain Managment: Goal: General experience of comfort will improve and/or be controlled Outcome: Progressing   Problem: Safety: Goal: Ability to remain free from injury will improve Outcome: Progressing   Problem: Skin Integrity: Goal: Risk for impaired skin integrity will decrease Outcome: Progressing   Problem: Education: Goal: Ability to describe self-care measures that may prevent or decrease complications (Diabetes Survival Skills Education) will improve Outcome: Progressing Goal: Individualized Educational Video(s) Outcome: Progressing   Problem: Coping: Goal: Ability to adjust to  condition or change in health will improve Outcome: Progressing   Problem: Fluid Volume: Goal: Ability to maintain a balanced intake and output will improve Outcome: Progressing   Problem: Health Behavior/Discharge Planning: Goal: Ability to identify and utilize available resources and services will improve Outcome: Progressing Goal: Ability to manage health-related needs will improve Outcome: Progressing   Problem: Metabolic: Goal: Ability to maintain appropriate glucose levels will improve Outcome: Progressing   Problem: Nutritional: Goal: Maintenance of adequate nutrition will improve Outcome: Progressing Goal: Progress toward achieving an optimal weight will improve Outcome: Progressing   Problem: Skin Integrity: Goal: Risk for impaired skin integrity will decrease Outcome: Progressing   Problem: Tissue Perfusion: Goal: Adequacy of tissue perfusion will improve Outcome: Progressing

## 2024-02-05 NOTE — Plan of Care (Signed)

## 2024-02-06 LAB — GLUCOSE, CAPILLARY
Glucose-Capillary: 171 mg/dL — ABNORMAL HIGH (ref 70–99)
Glucose-Capillary: 206 mg/dL — ABNORMAL HIGH (ref 70–99)
Glucose-Capillary: 210 mg/dL — ABNORMAL HIGH (ref 70–99)
Glucose-Capillary: 245 mg/dL — ABNORMAL HIGH (ref 70–99)
Glucose-Capillary: 247 mg/dL — ABNORMAL HIGH (ref 70–99)

## 2024-02-06 MED ORDER — OXYCODONE HCL 5 MG PO TABS
5.0000 mg | ORAL_TABLET | Freq: Four times a day (QID) | ORAL | Status: DC | PRN
  Administered 2024-02-06 – 2024-02-07 (×2): 5 mg via ORAL
  Filled 2024-02-06 (×2): qty 1

## 2024-02-06 MED ORDER — MELATONIN 3 MG PO TABS
3.0000 mg | ORAL_TABLET | Freq: Every day | ORAL | Status: DC
  Administered 2024-02-06: 3 mg via ORAL

## 2024-02-06 NOTE — Plan of Care (Signed)

## 2024-02-06 NOTE — Progress Notes (Signed)
    Assessment & Plan: POD#6 - s/p LAPAROSCOPIC VENTRAL HERNIA REPAIR WITH MESH - tolerating regular diet - Miralax  daily - ambulate with mobility specialist - SSI for diabetes - monitor drain output   Doing well.  Less pain.  Still needs to mobilize more.  Hep lock IV.  Likely home tomorrow.        Krystal Spinner, MD [facility] practice Office: [phone]        Chief Complaint: Ventral hernia  Subjective: Patient in bed, ordering regular breakfast.  Having BM's.  Objective: Vital signs in last 24 hours: Temp:  [97.5 F (36.4 C)-99.3 F (37.4 C)] 98.2 F (36.8 C) (11/16 0535) Pulse Rate:  [84-90] 84 (11/16 0535) Resp:  [17-18] 18 (11/16 0535) BP: (150-163)/(52-73) 163/73 (11/16 0535) SpO2:  [92 %-98 %] 92 % (11/16 0535) Last BM Date : 01/31/24  Intake/Output from previous day: 11/15 0701 - 11/16 0700 In: 360 [P.O.:360] Out: 240 [Urine:200; Drains:40] Intake/Output this shift: No intake/output data recorded.  Physical Exam: HEENT - sclerae clear, mucous membranes moist Abdomen - soft, wound dry and intact with honeycomb dressing; JP with thin serosanguinous  Lab Results:  Recent Labs    02/03/24 0814  WBC 6.3  HGB 7.9*  HCT 24.7*  PLT 115*   BMET Recent Labs    02/03/24 1033  NA 134*  K 4.4  CL 102  CO2 22  GLUCOSE 306*  BUN 13  CREATININE 0.72  CALCIUM  8.1*   PT/INR No results for input(s): LABPROT, INR in the last 72 hours. Comprehensive Metabolic Panel:    Component Value Date/Time   NA 134 (L) 02/03/2024 1033   NA 139 01/24/2024 1200   K 4.4 02/03/2024 1033   K 4.3 01/24/2024 1200   CL 102 02/03/2024 1033   CL 97 (L) 01/24/2024 1200   CO2 22 02/03/2024 1033   CO2 29 01/24/2024 1200   BUN 13 02/03/2024 1033   BUN 9 01/24/2024 1200   CREATININE 0.72 02/03/2024 1033   CREATININE 0.83 01/24/2024 1200   CREATININE 0.73 02/02/2022 0848   CREATININE 0.76 12/31/2020 0750   GLUCOSE 306 (H) 02/03/2024 1033    GLUCOSE 190 (H) 01/24/2024 1200   CALCIUM  8.1 (L) 02/03/2024 1033   CALCIUM  10.1 01/24/2024 1200   AST 33 05/14/2023 2101   AST 21 02/02/2022 0848   AST 21 05/05/2021 1050   AST 24 12/31/2020 0750   ALT 35 05/14/2023 2101   ALT 23 02/02/2022 0848   ALT 21 05/05/2021 1050   ALT 25 12/31/2020 0750   ALKPHOS 104 05/14/2023 2101   ALKPHOS 69 02/02/2022 0848   BILITOT 0.9 05/14/2023 2101   BILITOT 0.3 02/02/2022 0848   BILITOT 0.3 05/05/2021 1050   BILITOT 0.3 12/31/2020 0750   PROT 8.6 (H) 05/14/2023 2101   PROT 7.2 02/02/2022 0848   ALBUMIN 4.6 05/14/2023 2101   ALBUMIN 4.2 02/02/2022 0848    Studies/Results: No results found.    Krystal Spinner 02/06/2024  Patient ID: [name], female   DOB: 1955/12/25, 68 y.o.   MRN: [id number]

## 2024-02-06 NOTE — Plan of Care (Signed)
 Patient was motivated to do more of her ADL's today. Bath done. Health education reinforced. Needs attended. Call bell within reached. Bed alarm on. Sat on chair throughout the day. Daughters at bedside, wanted updates from the provider daily. Kept safe. Problem: Education: Goal: Knowledge of General Education information will improve Description: Including pain rating scale, medication(s)/side effects and non-pharmacologic comfort measures Outcome: Progressing   Problem: Health Behavior/Discharge Planning: Goal: Ability to manage health-related needs will improve Outcome: Progressing   Problem: Clinical Measurements: Goal: Ability to maintain clinical measurements within normal limits will improve Outcome: Progressing Goal: Will remain free from infection Outcome: Progressing Goal: Diagnostic test results will improve Outcome: Progressing Goal: Respiratory complications will improve Outcome: Progressing Goal: Cardiovascular complication will be avoided Outcome: Progressing   Problem: Activity: Goal: Risk for activity intolerance will decrease Outcome: Progressing   Problem: Nutrition: Goal: Adequate nutrition will be maintained Outcome: Progressing   Problem: Coping: Goal: Level of anxiety will decrease Outcome: Progressing   Problem: Elimination: Goal: Will not experience complications related to bowel motility Outcome: Progressing Goal: Will not experience complications related to urinary retention Outcome: Progressing   Problem: Pain Managment: Goal: General experience of comfort will improve and/or be controlled Outcome: Progressing   Problem: Safety: Goal: Ability to remain free from injury will improve Outcome: Progressing   Problem: Skin Integrity: Goal: Risk for impaired skin integrity will decrease Outcome: Progressing   Problem: Education: Goal: Ability to describe self-care measures that may prevent or decrease complications (Diabetes Survival Skills  Education) will improve Outcome: Progressing Goal: Individualized Educational Video(s) Outcome: Progressing   Problem: Coping: Goal: Ability to adjust to condition or change in health will improve Outcome: Progressing   Problem: Fluid Volume: Goal: Ability to maintain a balanced intake and output will improve Outcome: Progressing   Problem: Health Behavior/Discharge Planning: Goal: Ability to identify and utilize available resources and services will improve Outcome: Progressing Goal: Ability to manage health-related needs will improve Outcome: Progressing   Problem: Metabolic: Goal: Ability to maintain appropriate glucose levels will improve Outcome: Progressing   Problem: Nutritional: Goal: Maintenance of adequate nutrition will improve Outcome: Progressing Goal: Progress toward achieving an optimal weight will improve Outcome: Progressing   Problem: Skin Integrity: Goal: Risk for impaired skin integrity will decrease Outcome: Progressing   Problem: Tissue Perfusion: Goal: Adequacy of tissue perfusion will improve Outcome: Progressing

## 2024-02-06 NOTE — Progress Notes (Signed)
 There was no insulin  coverage during the night, however the Pt CBG was 206. SEE MAR. And orders. 2 units were given.

## 2024-02-07 ENCOUNTER — Other Ambulatory Visit (HOSPITAL_COMMUNITY): Payer: Self-pay

## 2024-02-07 DIAGNOSIS — K439 Ventral hernia without obstruction or gangrene: Secondary | ICD-10-CM | POA: Diagnosis not present

## 2024-02-07 LAB — GLUCOSE, CAPILLARY: Glucose-Capillary: 176 mg/dL — ABNORMAL HIGH (ref 70–99)

## 2024-02-07 MED ORDER — OXYCODONE HCL 5 MG PO TABS
5.0000 mg | ORAL_TABLET | Freq: Four times a day (QID) | ORAL | 0 refills | Status: AC | PRN
  Filled 2024-02-07: qty 20, 5d supply, fill #0

## 2024-02-07 MED ORDER — METHOCARBAMOL 500 MG PO TABS
500.0000 mg | ORAL_TABLET | Freq: Four times a day (QID) | ORAL | 2 refills | Status: AC | PRN
  Filled 2024-02-07: qty 20, 5d supply, fill #0
  Filled 2024-04-27: qty 20, 5d supply, fill #1

## 2024-02-07 NOTE — Discharge Summary (Signed)
 Physician Discharge Summary  Patient ID: [name] MRN: [id number] DOB/AGE: 1956/02/04 68 y.o.  Admit date: 01/31/2024 Discharge date: 02/07/2024  Admission Diagnoses:  Discharge Diagnoses:  Principal Problem:   Ventral hernia without obstruction or gangrene   Discharged Condition: good  Hospital Course: the pt underwent ventral hernia repair with mesh. Her postop course was complicated by ileus and pain management. Once her bowel function returned and her pain was under control she was ready for d/c home  Consults: None  Significant Diagnostic Studies: none  Treatments: surgery: as above  Discharge Exam: Blood pressure (!) 158/79, pulse 79, temperature 98.3 F (36.8 C), temperature source Oral, resp. rate 18, height 5' 7 (1.702 m), weight 95.3 kg, SpO2 93%. GI: soft, mild tenderness  Disposition: Discharge disposition: 01-Home or Self Care       Discharge Instructions     Call MD for:  difficulty breathing, headache or visual disturbances   Complete by: As directed    Call MD for:  extreme fatigue   Complete by: As directed    Call MD for:  hives   Complete by: As directed    Call MD for:  persistant dizziness or light-headedness   Complete by: As directed    Call MD for:  persistant nausea and vomiting   Complete by: As directed    Call MD for:  redness, tenderness, or signs of infection (pain, swelling, redness, odor or green/yellow discharge around incision site)   Complete by: As directed    Call MD for:  severe uncontrolled pain   Complete by: As directed    Call MD for:  temperature >100.4   Complete by: As directed    Diet - low sodium heart healthy   Complete by: As directed    Discharge instructions   Complete by: As directed    May shower.  Diet as tolerated.  Do not lift more than 10 pounds for the next 6 weeks.  Wear the binder most of the time   Increase activity slowly   Complete by: As directed    No wound care   Complete by: As  directed       Allergies as of 02/07/2024       Reactions   Glimepiride    Hypoglycemia         Medication List     PAUSE taking these medications    Mounjaro  5 MG/0.5ML Pen Wait to take this until your doctor or other care provider tells you to start again. Generic drug: tirzepatide  Inject 5 mg into the skin every 7 (seven) days.       STOP taking these medications    polyethylene glycol 17 g packet Commonly known as: MiraLax        TAKE these medications    Accu-Chek Guide Me w/Device Kit   Accu-Chek Guide test strip Generic drug: glucose blood   acetaminophen  500 MG tablet Commonly known as: TYLENOL  Take 1,000 mg by mouth every 6 (six) hours as needed for moderate pain.   anastrozole  1 MG tablet Commonly known as: ARIMIDEX  TAKE 1 TABLET EVERY DAY What changed: when to take this   benzonatate  200 MG capsule Commonly known as: TESSALON  Take 1 capsule (200 mg total) by mouth 3 (three) times daily as needed for cough.   ibuprofen 200 MG tablet Commonly known as: ADVIL Take 400 mg by mouth every 6 (six) hours as needed for moderate pain or headache.   INSULIN  SYRINGE .5CC/30GX5/16 30G X 5/16  0.5 ML Misc See admin instructions.   losartan  25 MG tablet Commonly known as: COZAAR  Take 25 mg by mouth at bedtime.   MAGnesium -Oxide 400 (240 Mg) MG tablet Generic drug: magnesium  oxide Take 400 mg by mouth at bedtime.   Melatonin 12 MG Tabs Take 12 mg by mouth daily as needed (Sleep).   methocarbamol  500 MG tablet Commonly known as: ROBAXIN  Take 1 tablet (500 mg total) by mouth every 6 (six) hours as needed for muscle spasms. What changed:  how much to take when to take this reasons to take this   multivitamin with minerals Tabs tablet Take 1 tablet by mouth daily.   NovoLOG  FlexPen 100 UNIT/ML FlexPen Generic drug: insulin  aspart Inject 5 Units into the skin 3 (three) times daily with meals. Per sliding scale   oxyCODONE  5 MG immediate  release tablet Commonly known as: Oxy IR/ROXICODONE  Take 1 tablet (5 mg total) by mouth every 6 (six) hours as needed for moderate pain (pain score 4-6).   pantoprazole  40 MG tablet Commonly known as: PROTONIX  Take 40 mg by mouth daily.   sertraline  100 MG tablet Commonly known as: ZOLOFT  Take 100 mg by mouth in the morning.   simvastatin  40 MG tablet Commonly known as: ZOCOR  Take 40 mg by mouth at bedtime.   Toujeo  SoloStar 300 UNIT/ML Solostar Pen Generic drug: insulin  glargine (1 Unit Dial) Inject 60 Units into the skin in the morning.   VITAMIN B-12 PO Take 1 tablet by mouth daily.   VITAMIN D -3 PO Take 1 tablet by mouth daily.        Follow-up Information     Curvin Mt III, MD Follow up in 2 week(s).   Specialty: General Surgery Contact information: [address] [place] [address] [place] [address] [phone]                 Signed: Mt Curvin III 02/07/2024, 2:47 PM

## 2024-02-07 NOTE — Progress Notes (Signed)
 JP drained removed as per orders. Site covered with gauze and paper tape.  Educated patient to assess the site daily and not to apply topical ointment. Signs and symptoms of infection reviewed.  Sending pt home with extra gauze and tape.

## 2024-02-07 NOTE — Progress Notes (Signed)
 Changed biopatch, tegaderm. And flushed JP drain.

## 2024-02-07 NOTE — Plan of Care (Signed)

## 2024-02-07 NOTE — Progress Notes (Signed)
 7 Days Post-Op   Subjective/Chief Complaint: Feeling better.  Passing gas and having bowel movements.   Objective: Vital signs in last 24 hours: Temp:  [98.3 F (36.8 C)-99.1 F (37.3 C)] 98.3 F (36.8 C) (11/17 0546) Pulse Rate:  [77-85] 79 (11/17 0546) Resp:  [17-18] 18 (11/17 0546) BP: (146-178)/(62-80) 158/79 (11/17 0546) SpO2:  [91 %-97 %] 93 % (11/17 0546) Last BM Date : 02/06/24  Intake/Output from previous day: 11/16 0701 - 11/17 0700 In: 720 [P.O.:720] Out: 30 [Drains:30] Intake/Output this shift: No intake/output data recorded.  General appearance: alert and cooperative Resp: clear to auscultation bilaterally Cardio: regular rate and rhythm GI: Soft, mild tenderness.  Incision looks good.  Lab Results:  No results for input(s): WBC, HGB, HCT, PLT in the last 72 hours. BMET No results for input(s): NA, K, CL, CO2, GLUCOSE, BUN, CREATININE, CALCIUM  in the last 72 hours. PT/INR No results for input(s): LABPROT, INR in the last 72 hours. ABG No results for input(s): PHART, HCO3 in the last 72 hours.  Invalid input(s): PCO2, PO2  Studies/Results: No results found.  Anti-infectives: Anti-infectives (From admission, onward)    Start     Dose/Rate Route Frequency Ordered Stop   01/31/24 0930  ceFAZolin  (ANCEF ) IVPB 2g/100 mL premix        2 g 200 mL/hr over 30 Minutes Intravenous On call to O.R. 01/31/24 0846 01/31/24 1056       Assessment/Plan: s/p Procedure(s) with comments: REPAIR, HERNIA, VENTRAL, LAPAROSCOPIC (N/A) - LAPAROSCOPIC ASSISTED VENTRAL HERNIA REPAIR WITH MESH INSERTION OF MESH (N/A) Advance diet Discharge D/c drain  LOS: 5 days    [name] III 02/07/2024

## 2024-02-09 ENCOUNTER — Telehealth: Payer: Self-pay

## 2024-02-09 NOTE — Telephone Encounter (Signed)
 Spoke with patient and confirmed appointment on 11/20

## 2024-02-10 ENCOUNTER — Inpatient Hospital Stay: Attending: Hematology and Oncology | Admitting: Hematology and Oncology

## 2024-02-10 ENCOUNTER — Encounter: Payer: Self-pay | Admitting: Hematology and Oncology

## 2024-02-10 VITALS — BP 156/54 | HR 77 | Temp 97.7°F | Resp 17 | Wt 208.1 lb

## 2024-02-10 DIAGNOSIS — C50211 Malignant neoplasm of upper-inner quadrant of right female breast: Secondary | ICD-10-CM | POA: Diagnosis not present

## 2024-02-10 DIAGNOSIS — Z803 Family history of malignant neoplasm of breast: Secondary | ICD-10-CM | POA: Insufficient documentation

## 2024-02-10 DIAGNOSIS — Z923 Personal history of irradiation: Secondary | ICD-10-CM | POA: Insufficient documentation

## 2024-02-10 DIAGNOSIS — Z794 Long term (current) use of insulin: Secondary | ICD-10-CM | POA: Insufficient documentation

## 2024-02-10 DIAGNOSIS — Z79899 Other long term (current) drug therapy: Secondary | ICD-10-CM | POA: Insufficient documentation

## 2024-02-10 DIAGNOSIS — Z1732 Human epidermal growth factor receptor 2 negative status: Secondary | ICD-10-CM | POA: Insufficient documentation

## 2024-02-10 DIAGNOSIS — G4700 Insomnia, unspecified: Secondary | ICD-10-CM | POA: Diagnosis not present

## 2024-02-10 DIAGNOSIS — Z170 Estrogen receptor positive status [ER+]: Secondary | ICD-10-CM | POA: Insufficient documentation

## 2024-02-10 DIAGNOSIS — E1142 Type 2 diabetes mellitus with diabetic polyneuropathy: Secondary | ICD-10-CM | POA: Insufficient documentation

## 2024-02-10 DIAGNOSIS — Z87891 Personal history of nicotine dependence: Secondary | ICD-10-CM | POA: Insufficient documentation

## 2024-02-10 DIAGNOSIS — Z1722 Progesterone receptor negative status: Secondary | ICD-10-CM | POA: Diagnosis not present

## 2024-02-10 DIAGNOSIS — Z79811 Long term (current) use of aromatase inhibitors: Secondary | ICD-10-CM | POA: Insufficient documentation

## 2024-02-10 DIAGNOSIS — Z9221 Personal history of antineoplastic chemotherapy: Secondary | ICD-10-CM | POA: Insufficient documentation

## 2024-02-10 DIAGNOSIS — Z800 Family history of malignant neoplasm of digestive organs: Secondary | ICD-10-CM | POA: Insufficient documentation

## 2024-02-10 NOTE — Progress Notes (Signed)
 [name]  [phone] Fax:([phone]) [phone]     ID: [name] DOB: [birth year]  MR#: [id number]  RDW#:[id number]  Patient Care Team: Seabron Lenis, MD as PCP - General (Family Medicine) Tyree Nanetta SAILOR, RN as Oncology Nurse Navigator Ebbie Cough, MD as Consulting Physician (General Surgery) Izell Domino, MD as Attending Physician (Radiation Oncology) Leva Rush, MD as Consulting Physician (Obstetrics and Gynecology) Tommas Pears, MD as Referring Physician (Endocrinology) Amber Stalls, MD OTHER MD:  CHIEF COMPLAINT: Estrogen receptor positive breast cancer  CURRENT TREATMENT: Anastrozole .  INTERVAL HISTORY:  History of Present Illness    Discussed the use of AI scribe software for clinical note transcription with the patient, who gave verbal consent to proceed.  History of Present Illness   [name] is a 68 year old female with history of breast cancer who presents for post-operative follow-up after ventral hernia surgery.  She underwent major surgery for a ventral hernia on January 31, 2024, due to progressive enlargement of the hernia. Initially, a laparoscopic approach was planned, but the procedure was converted to open surgery. She has puncture marks around her abdomen from the attempted laparoscopic procedure.  Post-operatively, she is experiencing difficulty with sleep, which she attributes to discontinuation of amitriptyline , previously used for neuropathy in her feet. The medication was tapered off due to her age, as advised by her insurance company. She is currently taking melatonin to aid with sleep.  She continues to take anastrozole  without any issues and had a mammogram in July 2025. A bone density scan was ordered but not yet scheduled, anticipated for next year.  Regarding her diabetes management, she is on Novolog  on a sliding scale and Toujeo  at 60 mg. Her hemoglobin A1c had been improving but became difficult to  control when she started feeling unwell in July 2025. Despite maintaining her diet and losing weight from a size 22 to a size 16, she experienced weight gain and worsening diabetes control, which she attributes to stress. Recently, her condition has improved as she resumed her medication and healthy eating habits.  A detailed review of systems today was otherwise stable.  COVID 19 VACCINATION STATUS: Pfizer x3 as of July 2022   HISTORY OF CURRENT ILLNESS:  From the original intake note:  [name] had routine screening mammography on 09/04/2020 showing a possible abnormality in the right breast. She underwent right diagnostic mammography with tomography and right breast ultrasonography at [facility] on 09/30/2020 showing: breast density category B; 0.7 cm mass in right breast at 12:30; normal right axillary lymph nodes.  Accordingly on 10/04/2020 she proceeded to biopsy of the right breast area in question. The pathology from this procedure ([id number]) showed: invasive mammary carcinoma, e-cadherin negative, grade 1; atypical lobular hyperplasia. Prognostic indicators significant for: estrogen receptor, 90% positive with moderate staining intensity and progesterone receptor, 0% negative. Proliferation marker Ki67 at 10%. HER2 negative by immunohistochemistry (1+).   Cancer Staging  Malignant neoplasm of upper-inner quadrant of right breast in female, estrogen receptor positive (HCC) Staging form: Breast, AJCC 8th Edition - Clinical stage from 10/16/2020: Stage IA (cT1b, cN0, cM0, G1, ER+, PR-, HER2-) - Signed by Layla Sandria BROCKS, MD on 10/16/2020 Stage prefix: Initial diagnosis Histologic grading system: 3 grade system Laterality: Right Staged by: Pathologist and managing physician Stage used in treatment planning: Yes National guidelines used in treatment planning: Yes Type of national guideline used in treatment planning: NCCN   The patient's subsequent history is as detailed  below.   PAST MEDICAL HISTORY: Past Medical History:  Diagnosis Date   Anxiety    Breast cancer (HCC) 10/04/2020   Cancer (HCC)    Cataract    removed   Diabetes mellitus without complication (HCC)    GERD (gastroesophageal reflux disease)    History of kidney stones    Hyperlipidemia    Hypertension    Personal history of chemotherapy    Personal history of radiation therapy    PONV (postoperative nausea and vomiting)    Sleep apnea     PAST SURGICAL HISTORY: Past Surgical History:  Procedure Laterality Date   ABDOMINAL HYSTERECTOMY     BREAST BIOPSY Right 10/04/2020   BREAST LUMPECTOMY Right 01/09/2021   Procedure: incision drainage right breast abscess;  Surgeon: Ebbie Cough, MD;  Location: [facility];  Service: General;  Laterality: Right;   BREAST LUMPECTOMY WITH RADIOACTIVE SEED AND SENTINEL LYMPH NODE BIOPSY Right 11/05/2020   Procedure: RIGHT BREAST LUMPECTOMY WITH RADIOACTIVE SEED AND RIGHT AXILLARY SENTINEL LYMPH NODE BIOPSY;  Surgeon: Ebbie Cough, MD;  Location: [facility];  Service: General;  Laterality: Right;   CHOLECYSTECTOMY     INSERTION OF MESH N/A 01/31/2024   Procedure: INSERTION OF MESH;  Surgeon: Curvin Deward MOULD, MD;  Location: [facility];  Service: General;  Laterality: N/A;   IR REMOVAL TUN ACCESS W/ PORT W/O FL MOD SED  08/13/2021   KNEE SURGERY     LAPAROTOMY N/A 05/19/2023   Procedure: EXPLORATORY LAPAROTOMY, LYSIS OF ADHESIONS;  Surgeon: Curvin Deward MOULD, MD;  Location: [facility];  Service: General;  Laterality: N/A;   PORTACATH PLACEMENT Right 12/30/2020   Procedure: INSERTION PORT-A-CATH;  Surgeon: Ebbie Cough, MD;  Location: [facility] OR;  Service: General;  Laterality: Right;   VENTRAL HERNIA REPAIR N/A 01/31/2024   Procedure: REPAIR, HERNIA, VENTRAL, LAPAROSCOPIC;  Surgeon: Curvin Deward MOULD, MD;  Location: [facility];  Service: General;  Laterality: N/A;  LAPAROSCOPIC ASSISTED VENTRAL HERNIA REPAIR WITH MESH    FAMILY HISTORY: Family  History  Problem Relation Age of Onset   Heart attack Mother    Diabetes Father    Colon cancer Maternal Grandmother    Breast cancer Cousin        dx early 69's   Her father died at age 3 from diabetes complications. Her mother died at age 25 from MI. Kelilah has two brothers (and no sisters). She reports breast cancer in a maternal cousin in her early 5's and colon cancer in a maternal grandmother.   GYNECOLOGIC HISTORY:  No LMP recorded. Patient has had a hysterectomy. Menarche: 68 years old Age at first live birth: 68 years old GX P 3 LMP 02/2000 Contraceptive: used from 4 HRT never used  Hysterectomy? Yes, 02/2000 BSO? 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 Husband Franky is a retired scientific laboratory technician. Son Herlene, age 10, is a firefighter and youth pastor in [place]. Daughter Rosaline Shoulder, age 83, is a high school principal in [place]. Daughter Stephane Satterfield, age 37, is a hair stylist here in [place]. Rosalie has two grandchildren. She attends a [employer].    ADVANCED DIRECTIVES: In the absence of any documentation to the contrary, the patient's spouse is their HCPOA.    HEALTH MAINTENANCE: Social History   Tobacco Use   Smoking status: Former    Current packs/day: 0.00  Average packs/day: 0.5 packs/day for 10.0 years (5.0 ttl pk-yrs)    Types: Cigarettes    Start date: 09/26/1971    Quit date: 09/25/1981    Years since quitting: 42.4   Smokeless tobacco: Never  Vaping Use   Vaping status: Never Used  Substance Use Topics   Alcohol use: Never   Drug use: Never     Colonoscopy: 2018 (Dr. Luis)  PAP: 08/2020  Bone density: 08/2020   Allergies  Allergen Reactions   Glimepiride     Hypoglycemia     Current Outpatient Medications  Medication Sig Dispense Refill   ACCU-CHEK GUIDE test strip      acetaminophen  (TYLENOL )  500 MG tablet Take 1,000 mg by mouth every 6 (six) hours as needed for moderate pain.     anastrozole  (ARIMIDEX ) 1 MG tablet TAKE 1 TABLET EVERY DAY (Patient taking differently: Take 1 mg by mouth at bedtime.) 90 tablet 3   benzonatate  (TESSALON ) 200 MG capsule Take 1 capsule (200 mg total) by mouth 3 (three) times daily as needed for cough. (Patient not taking: Reported on 01/20/2024) 20 capsule 0   Blood Glucose Monitoring Suppl (ACCU-CHEK GUIDE ME) w/Device KIT      Cholecalciferol  (VITAMIN D -3 PO) Take 1 tablet by mouth daily.     Cyanocobalamin  (VITAMIN B-12 PO) Take 1 tablet by mouth daily.     ibuprofen (ADVIL) 200 MG tablet Take 400 mg by mouth every 6 (six) hours as needed for moderate pain or headache.     Insulin  Syringe-Needle U-100 (INSULIN  SYRINGE .5CC/30GX5/16) 30G X 5/16 0.5 ML MISC See admin instructions.     losartan  (COZAAR ) 25 MG tablet Take 25 mg by mouth at bedtime.     MAGnesium -Oxide 400 (240 Mg) MG tablet Take 400 mg by mouth at bedtime.     Melatonin 12 MG TABS Take 12 mg by mouth daily as needed (Sleep).     methocarbamol  (ROBAXIN ) 500 MG tablet Take 1 tablet (500 mg total) by mouth every 6 (six) hours as needed for muscle spasms. 20 tablet 2   Multiple Vitamin (MULTIVITAMIN WITH MINERALS) TABS tablet Take 1 tablet by mouth daily.     NOVOLOG  FLEXPEN 100 UNIT/ML FlexPen Inject 5 Units into the skin 3 (three) times daily with meals. Per sliding scale     oxyCODONE  (OXY IR/ROXICODONE ) 5 MG immediate release tablet Take 1 tablet (5 mg total) by mouth every 6 (six) hours as needed for moderate pain (pain score 4-6). 20 tablet 0   pantoprazole  (PROTONIX ) 40 MG tablet Take 40 mg by mouth daily.     sertraline  (ZOLOFT ) 100 MG tablet Take 100 mg by mouth in the morning.     simvastatin  (ZOCOR ) 40 MG tablet Take 40 mg by mouth at bedtime.     [Paused] tirzepatide  (MOUNJARO ) 5 MG/0.5ML Pen Inject 5 mg into the skin every 7 (seven) days. 2 mL 5   TOUJEO  SOLOSTAR 300 UNIT/ML  Solostar Pen Inject 60 Units into the skin in the morning.     No current facility-administered medications for this visit.    OBJECTIVE: White woman in no acute distress  Vitals:   02/10/24 0917  BP: (!) 156/54  Pulse: 77  Resp: 17  Temp: 97.7 F (36.5 C)  SpO2: 93%        Body mass index is 32.59 kg/m.   Wt Readings from Last 3 Encounters:  02/10/24 208 lb 1.6 oz (94.4 kg)  01/31/24 210 lb (95.3 kg)  01/24/24  205 lb 9.6 oz (93.3 kg)   Physical Exam Constitutional:      Appearance: Normal appearance.  Chest:     Comments: Bilateral breast exam done, no palpable masses No regional adenopathy Musculoskeletal:        General: No swelling or tenderness.     Cervical back: Normal range of motion and neck supple. No rigidity.  Neurological:     General: No focal deficit present.     Mental Status: She is alert.      LAB RESULTS:  CMP     Component Value Date/Time   NA 134 (L) 02/03/2024 1033   K 4.4 02/03/2024 1033   CL 102 02/03/2024 1033   CO2 22 02/03/2024 1033   GLUCOSE 306 (H) 02/03/2024 1033   BUN 13 02/03/2024 1033   CREATININE 0.72 02/03/2024 1033   CREATININE 0.73 02/02/2022 0848   CALCIUM  8.1 (L) 02/03/2024 1033   PROT 8.6 (H) 05/14/2023 2101   ALBUMIN 4.6 05/14/2023 2101   AST 33 05/14/2023 2101   AST 21 02/02/2022 0848   ALT 35 05/14/2023 2101   ALT 23 02/02/2022 0848   ALKPHOS 104 05/14/2023 2101   BILITOT 0.9 05/14/2023 2101   BILITOT 0.3 02/02/2022 0848   GFRNONAA >60 02/03/2024 1033   GFRNONAA >60 02/02/2022 0848    No results found for: TOTALPROTELP, ALBUMINELP, A1GS, A2GS, BETS, BETA2SER, GAMS, MSPIKE, SPEI  Lab Results  Component Value Date   WBC 6.3 02/03/2024   NEUTROABS 2.8 02/02/2022   HGB 7.9 (L) 02/03/2024   HCT 24.7 (L) 02/03/2024   MCV 96.5 02/03/2024   PLT 115 (L) 02/03/2024    No results found for: LABCA2  No components found for: [id number]  No results for input(s): INR in the last 168  hours.  No results found for: LABCA2  No results found for: RJW800  No results found for: CAN125  No results found for: CAN153  No results found for: CA2729  No components found for: HGQUANT  No results found for: CEA1, CEA / No results found for: CEA1, CEA   No results found for: AFPTUMOR  No results found for: CHROMOGRNA  No results found for: KPAFRELGTCHN, LAMBDASER, KAPLAMBRATIO (kappa/lambda light chains)  No results found for: HGBA, HGBA2QUANT, HGBFQUANT, HGBSQUAN (Hemoglobinopathy evaluation)   No results found for: LDH  No results found for: IRON, TIBC, IRONPCTSAT (Iron and TIBC)  No results found for: FERRITIN  Urinalysis    Component Value Date/Time   COLORURINE YELLOW 05/15/2023 1341   APPEARANCEUR CLOUDY (A) 05/15/2023 1341   LABSPEC >1.046 (H) 05/15/2023 1341   PHURINE 5.0 05/15/2023 1341   GLUCOSEU NEGATIVE 05/15/2023 1341   HGBUR NEGATIVE 05/15/2023 1341   BILIRUBINUR NEGATIVE 05/15/2023 1341   KETONESUR NEGATIVE 05/15/2023 1341   PROTEINUR NEGATIVE 05/15/2023 1341   NITRITE NEGATIVE 05/15/2023 1341   LEUKOCYTESUR LARGE (A) 05/15/2023 1341    STUDIES: No results found.  ELIGIBLE FOR AVAILABLE RESEARCH PROTOCOL: no  ASSESSMENT:   68 y.o. [place] woman status post right breast upper inner quadrant biopsy 10/04/2020 for clinical T1b N0, stage IA invasive lobular carcinoma, E-cadherin negative, grade 1, estrogen receptor positive, progesterone receptor and HER2 negative, with an MIB-1 of 10%  (1) status post right lumpectomy and sentinel lymph node sampling 11/05/2020 for a PT1b pN0, stage IA invasive lobular carcinoma, grade 2  (2) Oncotype score of 32 predicts a risk of recurrence outside the breast within the next 9 years of 20% if the patient's only systemic therapy  is antiestrogens for 5 years.  It also predicts significant benefit from chemotherapy.  (3) adjuvant chemotherapy with  cyclophosphamide  and docetaxel  every 21 days x 4 started 12/31/2020, completed 03/03/2021  (4) completed adjuvant radiation and is now on antiestrogen therapy, anastrozole   PLAN Assessment & Plan  Postoperative state following ventral hernia repair Healing ongoing. Open surgery required due to hernia size. - Continue monitoring healing process.  Type 2 diabetes mellitus, insulin -requiring Managed with Novolog  and Toujeo . Hemoglobin A1c improving post-illness and dietary changes. - Continue Novolog  on sliding scale. - Continue Toujeo  60 mg. - Encouraged dietary modifications and regular physical activity.  Breast cancer in remission, on anastrozole  In remission, managed with anastrozole . Recent mammogram normal. - Continue anastrozole . - Ordered mammogram for July 2026.  Peripheral neuropathy of the feet Symptoms include nocturnal pain, possibly related to restless leg syndrome. Oxycodone  used for pain management.  Insomnia Melatonin started, providing some relief. Discussed sleep hygiene measures. - Continue melatonin, may increase to 10 mg if needed. - Implement sleep hygiene measures such as warm milk and showers.   Total encounter time 30 minutes*   *Total Encounter Time as defined by the Centers for Medicare and Medicaid Services includes, in addition to the face-to-face time of a patient visit (documented in the note above) non-face-to-face time: obtaining and reviewing outside history, ordering and reviewing medications, tests or procedures, care coordination (communications with other health care professionals or caregivers) and documentation in the medical record.

## 2024-02-20 DIAGNOSIS — Z170 Estrogen receptor positive status [ER+]: Secondary | ICD-10-CM | POA: Diagnosis not present

## 2024-02-20 DIAGNOSIS — E1165 Type 2 diabetes mellitus with hyperglycemia: Secondary | ICD-10-CM | POA: Diagnosis not present

## 2024-02-20 DIAGNOSIS — C50211 Malignant neoplasm of upper-inner quadrant of right female breast: Secondary | ICD-10-CM | POA: Diagnosis not present

## 2024-02-20 DIAGNOSIS — E1169 Type 2 diabetes mellitus with other specified complication: Secondary | ICD-10-CM | POA: Diagnosis not present

## 2024-02-20 DIAGNOSIS — E782 Mixed hyperlipidemia: Secondary | ICD-10-CM | POA: Diagnosis not present

## 2024-02-20 DIAGNOSIS — I10 Essential (primary) hypertension: Secondary | ICD-10-CM | POA: Diagnosis not present

## 2024-02-29 NOTE — Progress Notes (Signed)
 "  PROVIDER:  DEWARD GARNETTE NULL, MD  MRN: [id number] DOB: 15-Dec-1955 DATE OF ENCOUNTER: 02/29/2024 Subjective     Chief Complaint: Post Operative Visit     History of Present Illness: [name] is a 68 y.o. female who is seen today for ventral hernia.  The patient is a 68 year old white female who is about 1 month status post laparoscopic ventral hernia repair with mesh.  She tolerated the surgery well.  Her postoperative course was complicated by an ileus which required several days in the hospital.  She seems to be doing well at home.  She has had a couple episodes of stabbing right sided abdominal pain but this did resolve.  She has some baseline nausea but no vomiting.  Her appetite is slowly improving and her bowels are moving normally     Review of Systems: A complete review of systems was obtained from the patient.  I have reviewed this information and discussed as appropriate with the patient.  See HPI as well for other ROS.  ROS    Medical History: History reviewed. No pertinent past medical history.  Patient Active Problem List  Diagnosis   Diabetes mellitus (CMS/HHS-HCC)   Diverticulitis   Kidney stone   Malignant neoplasm of upper-inner quadrant of right breast in female, estrogen receptor positive (CMS/HHS-HCC)   Essential hypertension   Pure hypercholesterolemia   SBO (small bowel obstruction) (CMS/HHS-HCC)   Small bowel obstruction due to adhesions (CMS/HHS-HCC)   Ventral hernia without obstruction or gangrene    Past Surgical History:  Procedure Laterality Date   ABDOMINAL HYSTERECTOMY     DEEP AXILLARY SENTINEL NODE BIOPSY / EXCISION Right    HERNIA REPAIR     LAPAROSCOPIC CHOLECYSTECTOMY     MASTECTOMY PARTIAL / LUMPECTOMY Right      Allergies  Allergen Reactions   Glimepiride Other (See Comments)    Hypoglycemia    Current Outpatient Medications on File Prior to Visit  Medication Sig Dispense Refill   amitriptyline  (ELAVIL ) 25  MG tablet      CONCENTRATED insulin  glargine (TOUJEO  SOLOSTAR U-300 INSULIN ) pen injector (concentration 300 units/mL)      CONCENTRATED insulin  glargine (TOUJEO  SOLOSTAR U-300 INSULIN ) pen injector (concentration 300 units/mL) 30u     losartan  (COZAAR ) 25 MG tablet losartan  25 mg tablet  TAKE 1 TABLET BY MOUTH EVERY DAY     losartan  (COZAAR ) 25 MG tablet 1 tablet     sertraline  (ZOLOFT ) 50 MG tablet      simvastatin  (ZOCOR ) 10 MG tablet Take 10 mg by mouth once daily     simvastatin  (ZOCOR ) 40 MG tablet simvastatin  40 mg tablet  TAKE 1 TABLET BY MOUTH EVERY EVENING     amitriptyline  (ELAVIL ) 25 MG tablet Take 1 tablet by mouth nightly     blood glucose diagnostic (ACCU-CHEK GUIDE TEST STRIPS) test strip Accu-Chek Guide test strips  USE AS DIRECTED 3 TIMES A DAY     dulaglutide (TRULICITY) 0.75 mg/0.5 mL pen injector Inject subcutaneously     insulin  GLARGINE (LANTUS ) injection (concentration 100 units/mL) Inject subcutaneously     metFORMIN (GLUCOPHAGE) 1000 MG tablet Take by mouth     metFORMIN (GLUCOPHAGE) 500 MG tablet metformin 500 mg tablet  TAKE 2 TABLETS BY MOUTH TWICE A DAY     No current facility-administered medications on file prior to visit.    Family History  Problem Relation Age of Onset   Breast cancer Neg Hx      Social History  Tobacco Use  Smoking Status Never  Smokeless Tobacco Never     Social History   Socioeconomic History   Marital status: Married  Tobacco Use   Smoking status: Never   Smokeless tobacco: Never  Vaping Use   Vaping status: Never Used  Substance and Sexual Activity   Alcohol use: Not Currently   Social Drivers of Health   Financial Resource Strain: Medium Risk (10/16/2020)   Received from [facility]   Overall Financial Resource Strain (CARDIA)    Difficulty of Paying Living Expenses: Somewhat hard  Food Insecurity: No Food Insecurity (02/04/2024)   Received from [facility]   Hunger Vital Sign     Within the past 12 months, you worried that your food would run out before you got the money to buy more.: Never true    Within the past 12 months, the food you bought just didn't last and you didn't have money to get more.: Never true  Transportation Needs: No Transportation Needs (02/04/2024)   Received from [facility] - Transportation    In the past 12 months, has lack of transportation kept you from medical appointments or from getting medications?: No    In the past 12 months, has lack of transportation kept you from meetings, work, or from getting things needed for daily living?: No  Social Connections: Socially Integrated (02/04/2024)   Received from [facility]   Social Connection and Isolation Panel    In a typical week, how many times do you talk on the phone with family, friends, or neighbors?: More than three times a week    How often do you get together with friends or relatives?: More than three times a week    How often do you attend church or religious services?: More than 4 times per year    Do you belong to any clubs or organizations such as church groups, unions, fraternal or athletic groups, or school groups?: Yes    How often do you attend meetings of the clubs or organizations you belong to?: More than 4 times per year    Are you married, widowed, divorced, separated, never married, or living with a partner?: Married  Housing Stability: Unknown (05/31/2023)   Housing Stability Vital Sign    Homeless in the Last Year: No    Objective:    There were no vitals filed for this visit.  There is no height or weight on file to calculate BMI.  Physical Exam Vitals reviewed.  Constitutional:      General: She is not in acute distress.    Appearance: Normal appearance.  HENT:     Head: Normocephalic and atraumatic.     Right Ear: External ear normal.     Left Ear: External ear normal.     Nose: Nose normal.     Mouth/Throat:     Mouth: Mucous  membranes are moist.     Pharynx: Oropharynx is clear.  Eyes:     General: No scleral icterus.    Extraocular Movements: Extraocular movements intact.     Conjunctiva/sclera: Conjunctivae normal.     Pupils: Pupils are equal, round, and reactive to light.  Cardiovascular:     Rate and Rhythm: Normal rate and regular rhythm.     Pulses: Normal pulses.     Heart sounds: Normal heart sounds.  Pulmonary:     Effort: Pulmonary effort is normal. No respiratory distress.     Breath sounds: Normal breath sounds.  Abdominal:     General: Bowel sounds are normal.     Palpations: Abdomen is soft.     Tenderness: There is no abdominal tenderness.     Comments: The abdomen is soft with minimal tenderness.  The incisions are healing nicely with no sign of infection or seroma although she does have a little bit of redness around the lateralmost staples.  The abdominal wall feels solid with no palpable evidence of recurrence of the hernia.  The staples were removed today without difficulty  Musculoskeletal:        General: No swelling, tenderness or deformity. Normal range of motion.     Cervical back: Normal range of motion and neck supple.  Skin:    General: Skin is warm and dry.     Coloration: Skin is not jaundiced.  Neurological:     General: No focal deficit present.     Mental Status: She is alert and oriented to person, place, and time.  Psychiatric:        Mood and Affect: Mood normal.        Behavior: Behavior normal.         Labs, Imaging and Diagnostic Testing:     Assessment and Plan:     Diagnoses and all orders for this visit:  Ventral hernia without obstruction or gangrene     The patient is about 4 weeks status post laparoscopic assisted ventral hernia repair with mesh.  She continues to do well.  At this point she should refrain from any strenuous activity for at least another 2 weeks.  After that time she may begin slowly returning to normal activities without  restriction.  I will plan to see her back in 1 month to check her progress  Return in about 1 month (around 03/31/2024).   DEWARD GARNETTE NULL, MD    I had direct face-to-face contact with the patient for a total of 20 minutes and greater than 50% of that time was spent providing counseling and/or coordination of care for the patient regarding ventral hernia repair with mesh. "

## 2024-04-24 ENCOUNTER — Other Ambulatory Visit (HOSPITAL_COMMUNITY): Payer: Self-pay

## 2024-04-27 ENCOUNTER — Other Ambulatory Visit (HOSPITAL_BASED_OUTPATIENT_CLINIC_OR_DEPARTMENT_OTHER): Payer: Self-pay

## 2025-02-09 ENCOUNTER — Inpatient Hospital Stay: Admitting: Hematology and Oncology
